# Patient Record
Sex: Female | Born: 1965 | Race: Black or African American | Hispanic: No | Marital: Married | Smoking: Never smoker
Health system: Southern US, Community
[De-identification: ages and names within clinical notes are randomized; demographics above are authoritative.]

## PROBLEM LIST (undated history)

## (undated) DIAGNOSIS — I10 Essential (primary) hypertension: Secondary | ICD-10-CM

## (undated) DIAGNOSIS — D219 Benign neoplasm of connective and other soft tissue, unspecified: Secondary | ICD-10-CM

## (undated) DIAGNOSIS — F329 Major depressive disorder, single episode, unspecified: Secondary | ICD-10-CM

## (undated) DIAGNOSIS — K449 Diaphragmatic hernia without obstruction or gangrene: Secondary | ICD-10-CM

## (undated) DIAGNOSIS — K5790 Diverticulosis of intestine, part unspecified, without perforation or abscess without bleeding: Secondary | ICD-10-CM

## (undated) DIAGNOSIS — G4730 Sleep apnea, unspecified: Secondary | ICD-10-CM

## (undated) DIAGNOSIS — C50919 Malignant neoplasm of unspecified site of unspecified female breast: Secondary | ICD-10-CM

## (undated) DIAGNOSIS — E039 Hypothyroidism, unspecified: Secondary | ICD-10-CM

## (undated) HISTORY — DX: Major depressive disorder, single episode, unspecified: F32.9

## (undated) HISTORY — DX: Malignant neoplasm of unspecified site of unspecified female breast: C50.919

## (undated) HISTORY — DX: Essential (primary) hypertension: I10

## (undated) HISTORY — DX: Diaphragmatic hernia without obstruction or gangrene: K44.9

## (undated) HISTORY — DX: Hypothyroidism, unspecified: E03.9

## (undated) HISTORY — PX: TUBAL LIGATION: SHX77

## (undated) HISTORY — DX: Diverticulosis of intestine, part unspecified, without perforation or abscess without bleeding: K57.90

## (undated) HISTORY — PX: MASTECTOMY: SHX3

## (undated) HISTORY — DX: Benign neoplasm of connective and other soft tissue, unspecified: D21.9

---

## 2004-09-01 ENCOUNTER — Ambulatory Visit: Payer: Self-pay | Admitting: *Deleted

## 2004-09-15 ENCOUNTER — Ambulatory Visit: Payer: Self-pay | Admitting: *Deleted

## 2004-09-22 ENCOUNTER — Ambulatory Visit: Payer: Self-pay | Admitting: *Deleted

## 2004-10-02 ENCOUNTER — Inpatient Hospital Stay (HOSPITAL_COMMUNITY): Admission: AD | Admit: 2004-10-02 | Discharge: 2004-10-02 | Payer: Self-pay | Admitting: Family Medicine

## 2004-10-06 ENCOUNTER — Ambulatory Visit: Payer: Self-pay | Admitting: *Deleted

## 2004-10-20 ENCOUNTER — Ambulatory Visit: Payer: Self-pay | Admitting: *Deleted

## 2004-10-29 ENCOUNTER — Ambulatory Visit (HOSPITAL_COMMUNITY): Admission: RE | Admit: 2004-10-29 | Discharge: 2004-10-29 | Payer: Self-pay | Admitting: *Deleted

## 2004-11-03 ENCOUNTER — Ambulatory Visit: Payer: Self-pay | Admitting: *Deleted

## 2004-11-12 ENCOUNTER — Ambulatory Visit: Payer: Self-pay | Admitting: Family Medicine

## 2004-11-17 ENCOUNTER — Ambulatory Visit (HOSPITAL_COMMUNITY): Admission: RE | Admit: 2004-11-17 | Discharge: 2004-11-17 | Payer: Self-pay | Admitting: *Deleted

## 2004-11-19 ENCOUNTER — Ambulatory Visit: Payer: Self-pay | Admitting: Family Medicine

## 2004-11-26 ENCOUNTER — Ambulatory Visit: Payer: Self-pay | Admitting: Family Medicine

## 2004-11-26 ENCOUNTER — Inpatient Hospital Stay (HOSPITAL_COMMUNITY): Admission: RE | Admit: 2004-11-26 | Discharge: 2004-11-26 | Payer: Self-pay | Admitting: Family Medicine

## 2004-12-03 ENCOUNTER — Ambulatory Visit: Payer: Self-pay | Admitting: *Deleted

## 2004-12-09 ENCOUNTER — Ambulatory Visit (HOSPITAL_COMMUNITY): Admission: RE | Admit: 2004-12-09 | Discharge: 2004-12-09 | Payer: Self-pay | Admitting: *Deleted

## 2004-12-09 ENCOUNTER — Ambulatory Visit: Payer: Self-pay | Admitting: Family Medicine

## 2004-12-10 ENCOUNTER — Inpatient Hospital Stay (HOSPITAL_COMMUNITY): Admission: AD | Admit: 2004-12-10 | Discharge: 2004-12-10 | Payer: Self-pay | Admitting: Obstetrics & Gynecology

## 2004-12-16 ENCOUNTER — Ambulatory Visit: Payer: Self-pay | Admitting: Obstetrics & Gynecology

## 2004-12-22 ENCOUNTER — Ambulatory Visit: Payer: Self-pay | Admitting: *Deleted

## 2004-12-24 ENCOUNTER — Ambulatory Visit: Payer: Self-pay | Admitting: Obstetrics and Gynecology

## 2004-12-24 ENCOUNTER — Inpatient Hospital Stay (HOSPITAL_COMMUNITY): Admission: AD | Admit: 2004-12-24 | Discharge: 2004-12-26 | Payer: Self-pay | Admitting: Family Medicine

## 2004-12-31 ENCOUNTER — Ambulatory Visit: Admission: RE | Admit: 2004-12-31 | Discharge: 2004-12-31 | Payer: Self-pay | Admitting: Family Medicine

## 2006-09-28 ENCOUNTER — Encounter: Payer: Self-pay | Admitting: Obstetrics & Gynecology

## 2006-09-28 ENCOUNTER — Ambulatory Visit: Payer: Self-pay | Admitting: Obstetrics & Gynecology

## 2007-02-22 ENCOUNTER — Ambulatory Visit: Payer: Self-pay | Admitting: Obstetrics and Gynecology

## 2007-03-15 ENCOUNTER — Ambulatory Visit: Payer: Self-pay | Admitting: *Deleted

## 2007-04-05 ENCOUNTER — Ambulatory Visit (HOSPITAL_COMMUNITY): Admission: RE | Admit: 2007-04-05 | Discharge: 2007-04-05 | Payer: Self-pay | Admitting: Family Medicine

## 2007-04-13 ENCOUNTER — Ambulatory Visit: Payer: Self-pay | Admitting: Family Medicine

## 2007-04-14 ENCOUNTER — Ambulatory Visit: Payer: Self-pay | Admitting: Obstetrics and Gynecology

## 2007-05-03 ENCOUNTER — Ambulatory Visit (HOSPITAL_COMMUNITY): Admission: RE | Admit: 2007-05-03 | Discharge: 2007-05-03 | Payer: Self-pay | Admitting: Family Medicine

## 2007-05-04 ENCOUNTER — Ambulatory Visit: Payer: Self-pay | Admitting: *Deleted

## 2007-05-17 ENCOUNTER — Ambulatory Visit (HOSPITAL_COMMUNITY): Admission: RE | Admit: 2007-05-17 | Discharge: 2007-05-17 | Payer: Self-pay | Admitting: Obstetrics & Gynecology

## 2007-05-29 ENCOUNTER — Ambulatory Visit: Payer: Self-pay | Admitting: Obstetrics & Gynecology

## 2007-06-26 ENCOUNTER — Ambulatory Visit: Payer: Self-pay | Admitting: Obstetrics & Gynecology

## 2007-06-26 ENCOUNTER — Ambulatory Visit (HOSPITAL_COMMUNITY): Admission: RE | Admit: 2007-06-26 | Discharge: 2007-06-26 | Payer: Self-pay | Admitting: Obstetrics & Gynecology

## 2007-07-17 ENCOUNTER — Ambulatory Visit: Payer: Self-pay | Admitting: Obstetrics & Gynecology

## 2007-08-07 ENCOUNTER — Ambulatory Visit (HOSPITAL_COMMUNITY): Admission: RE | Admit: 2007-08-07 | Discharge: 2007-08-07 | Payer: Self-pay | Admitting: Obstetrics & Gynecology

## 2007-08-10 ENCOUNTER — Ambulatory Visit: Payer: Self-pay | Admitting: Family Medicine

## 2007-08-24 ENCOUNTER — Ambulatory Visit: Payer: Self-pay | Admitting: Obstetrics & Gynecology

## 2007-09-04 ENCOUNTER — Ambulatory Visit (HOSPITAL_COMMUNITY): Admission: RE | Admit: 2007-09-04 | Discharge: 2007-09-04 | Payer: Self-pay | Admitting: Obstetrics & Gynecology

## 2007-09-08 ENCOUNTER — Ambulatory Visit: Payer: Self-pay | Admitting: Obstetrics and Gynecology

## 2007-09-08 ENCOUNTER — Inpatient Hospital Stay (HOSPITAL_COMMUNITY): Admission: AD | Admit: 2007-09-08 | Discharge: 2007-09-08 | Payer: Self-pay | Admitting: Family Medicine

## 2007-09-14 ENCOUNTER — Ambulatory Visit: Payer: Self-pay | Admitting: Family Medicine

## 2007-09-17 ENCOUNTER — Inpatient Hospital Stay (HOSPITAL_COMMUNITY): Admission: AD | Admit: 2007-09-17 | Discharge: 2007-09-18 | Payer: Self-pay | Admitting: Obstetrics and Gynecology

## 2007-09-17 ENCOUNTER — Ambulatory Visit: Payer: Self-pay | Admitting: Obstetrics and Gynecology

## 2007-09-21 ENCOUNTER — Ambulatory Visit: Payer: Self-pay | Admitting: Obstetrics & Gynecology

## 2007-09-28 ENCOUNTER — Ambulatory Visit: Payer: Self-pay | Admitting: Obstetrics & Gynecology

## 2007-10-02 ENCOUNTER — Encounter: Payer: Self-pay | Admitting: *Deleted

## 2007-10-02 ENCOUNTER — Ambulatory Visit: Payer: Self-pay | Admitting: Obstetrics & Gynecology

## 2007-10-02 ENCOUNTER — Inpatient Hospital Stay (HOSPITAL_COMMUNITY): Admission: AD | Admit: 2007-10-02 | Discharge: 2007-10-04 | Payer: Self-pay | Admitting: Obstetrics & Gynecology

## 2007-10-12 ENCOUNTER — Ambulatory Visit: Payer: Self-pay | Admitting: Obstetrics & Gynecology

## 2007-11-22 ENCOUNTER — Ambulatory Visit: Payer: Self-pay | Admitting: Obstetrics & Gynecology

## 2007-12-06 ENCOUNTER — Ambulatory Visit: Payer: Self-pay | Admitting: Obstetrics & Gynecology

## 2007-12-06 ENCOUNTER — Encounter: Payer: Self-pay | Admitting: Obstetrics & Gynecology

## 2009-05-20 ENCOUNTER — Encounter: Payer: Self-pay | Admitting: Obstetrics & Gynecology

## 2009-05-20 ENCOUNTER — Ambulatory Visit: Payer: Self-pay | Admitting: Obstetrics & Gynecology

## 2009-05-21 ENCOUNTER — Encounter: Payer: Self-pay | Admitting: Obstetrics & Gynecology

## 2009-05-21 LAB — CONVERTED CEMR LAB
Albumin: 4.4 g/dL (ref 3.5–5.2)
Alkaline Phosphatase: 62 units/L (ref 39–117)
CO2: 25 meq/L (ref 19–32)
Calcium: 9.2 mg/dL (ref 8.4–10.5)
Creatinine, Ser: 0.65 mg/dL (ref 0.40–1.20)
LDL Cholesterol: 146 mg/dL — ABNORMAL HIGH (ref 0–99)
Sodium: 140 meq/L (ref 135–145)
Total Bilirubin: 0.4 mg/dL (ref 0.3–1.2)
Total CHOL/HDL Ratio: 3.6
Total Protein: 7.7 g/dL (ref 6.0–8.3)
VLDL: 9 mg/dL (ref 0–40)

## 2009-05-26 ENCOUNTER — Encounter: Admission: RE | Admit: 2009-05-26 | Discharge: 2009-05-26 | Payer: Self-pay | Admitting: Obstetrics & Gynecology

## 2009-05-26 ENCOUNTER — Encounter: Payer: Self-pay | Admitting: Obstetrics & Gynecology

## 2010-08-06 ENCOUNTER — Ambulatory Visit: Admit: 2010-08-06 | Payer: Self-pay | Admitting: Obstetrics & Gynecology

## 2010-08-10 ENCOUNTER — Encounter
Admission: RE | Admit: 2010-08-10 | Discharge: 2010-08-10 | Payer: Self-pay | Source: Home / Self Care | Attending: Obstetrics & Gynecology | Admitting: Obstetrics & Gynecology

## 2010-08-11 ENCOUNTER — Other Ambulatory Visit: Payer: Self-pay | Admitting: Obstetrics & Gynecology

## 2010-08-11 ENCOUNTER — Ambulatory Visit
Admission: RE | Admit: 2010-08-11 | Discharge: 2010-08-11 | Payer: Self-pay | Source: Home / Self Care | Attending: Obstetrics & Gynecology | Admitting: Obstetrics & Gynecology

## 2010-08-14 ENCOUNTER — Encounter: Payer: Self-pay | Admitting: Obstetrics & Gynecology

## 2010-08-14 LAB — CONVERTED CEMR LAB
Cholesterol: 202 mg/dL — ABNORMAL HIGH (ref 0–200)
LDL Cholesterol: 137 mg/dL — ABNORMAL HIGH (ref 0–99)
TSH: 1.553 microintl units/mL (ref 0.350–4.500)
Triglycerides: 44 mg/dL (ref <150)
VLDL: 9 mg/dL (ref 0–40)

## 2010-12-08 NOTE — Op Note (Signed)
NAME:  Brittney Vance, Brittney Vance                  ACCOUNT NO.:  [id number]   MEDICAL RECORD NO.:  [id number]          PATIENT TYPE:  INP   LOCATION:  9111                          FACILITY:  [facility]   PHYSICIAN:  Allie Bossier, MD        DATE OF BIRTH:  [birth year]   DATE OF PROCEDURE:  10/03/2007  DATE OF DISCHARGE:                               OPERATIVE REPORT   PREOPERATIVE DIAGNOSIS:  1. Postpartum day #1 from spontaneous vaginal delivery.  2. Desires sterilization.   POSTOPERATIVE DIAGNOSIS:  1. Postpartum day #1 from spontaneous vaginal delivery.  2. Desires sterilization.   PROCEDURE:  Bilateral tubal occlusion with Filshie clips.   SURGEON:  Allie Bossier, M.D.   ASSISTANT:  Karlton Lemon, M.D.   ANESTHESIA:  Epidural.   FINDINGS:  Normal female anatomy as tubes were visualized.   ESTIMATED BLOOD LOSS:  Minimal, less than 10 mL.   DRAINS:  Foley with clear yellow urine.   COMPLICATIONS:  None immediate.   SPECIMENS:  None.   INDICATIONS FOR PROCEDURE:  The patient is a 45 year old gravida 2, para  2-0-0-2, who is postpartum day #1 from spontaneous vaginal delivery.  She has expressed the desire for permanent sterilization.  She filled  out her Medicaid form greater than 30 days in advance of the procedure.  She was counseled again about the permanent nature of the procedure and  wishes to proceed. She understands and accepts the failure rate of 1%.   DESCRIPTION OF PROCEDURE:  The patient was taken to the operating room  and after ensuring adequate epidural anesthesia, she was prepped and  draped in the usual sterile fashion in the supine position.  After  ensuring adequate anesthesia, the skin incision was made horizontally  through the umbilicus and carried down through the fascia into the  peritoneal cavity.  Army-Navy retractors were placed and the right  fallopian tube was identified.  The tube was clamped with a Babcock  clamp and followed out to the end of the tube with a  second Babcock  clamp.  The fimbria was identified easily.  It was visualized well.  The  Babcock clamps were then placed in the isthmus ampullary junction.  The  Filshie clip was placed under good visualization completely occluding  the tube.  The Babcock clamps were released and the tube was allowed to  fall back into the peritoneal cavity.  Attention was then turned to the  left tube.  The tube was identified and clamped with a Babcock clamp.  Once again the tube was followed out with a second Babcock clamp until  the fimbria was identified.  A Filshie clip was then placed between two  Babcock clamps at the isthmus ampullary junction of the fallopian tube  under direct visualization.  After placement of the Filshie clip, the  Babcock clamps were released and the tube was allowed to fall back  within the peritoneal cavity.  The fascia was then reapproximated with 0  Vicryl.  The subcutaneous tissues were then reapproximated with 4-0  Vicryl.  The patient tolerated  the procedure well and went to the  postanesthesia care unit in stable condition.  The instrument, sponge,  and needle counts were correct.      Karlton Lemon, MD  Electronically Signed     ______________________________  Allie Bossier, MD    NS/MEDQ  D:  10/03/2007  T:  10/04/2007  Job:  [id number]

## 2010-12-08 NOTE — Assessment & Plan Note (Signed)
NAME:  Brittney Vance, Brittney Vance                  ACCOUNT NO.:  [id number]   MEDICAL RECORD NO.:  [id number]          PATIENT TYPE:  POB   LOCATION:  [facility] at [place]         FACILITY:  [facility]   PHYSICIAN:  Allie Bossier, MD        DATE OF BIRTH:  February 06, 1966   DATE OF SERVICE:  08/11/2010                                  CLINIC NOTE   Brittney Vance is a 45 year old married black G2, P2, she has a 66-year-old  daughter and a 51-year-old son.  She comes here for annual exam.  She has  no particular complaints.   PAST MEDICAL HISTORY:  Significant for a fibroid seen on ultrasound  during her pregnancy and hypothyroidism and a history of hypertension,  although she is on no meds.   MEDICATIONS:  She takes Synthroid 0.75 mg daily and a multivitamin  daily.   REVIEW OF SYSTEMS:  She says her periods are monthly and lasts about  anywhere from 3 to 5 days.  She is married for 6-1/2 years and denies  dyspareunia.  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   PREVIOUS SURGERY:  Tubal ligation.   FAMILY HISTORY:  Negative for breast, GYN, and colon malignancies, but  positive for diabetes in her father and hypertension in both of her  parents.   No latex allergies.  No known drug allergies.   SOCIAL HISTORY:  She drinks wine socially and denies tobacco or drug  use.   REVIEW OF SYSTEMS:  Her mammogram was normal this year.   PHYSICAL EXAMINATION:  GENERAL:  Well-nourished, well-hydrated very  pleasant black female, height 5 feet 2 inches, weight 142 pounds, blood  pressure 139/86, pulse 88.  HEENT:  Normal.  HEART:  Regular rate rhythm.  LUNGS:  Clear to auscultation bilaterally.  ABDOMEN:  Benign.  GU:  External genitalia normal.  Cervix normal.  Uterus is retroverted,  8-week size (no change from previous exams).  Adnexa nontender, no  masses.   ASSESSMENT AND PLAN:  Annual exam.  I have checked the Pap smear.  Recommended self-breast and self-vulvar exams.   Her mammogram is up to  date.  With regard to her hypothyroidism, her endocrinologist has left  town, so she will come back next week to have her TSH checked along with  a fasting lipid pane.      Allie Bossier, MD     MCD/MEDQ  D:  08/11/2010  T:  08/12/2010  Job:  [id number]

## 2010-12-08 NOTE — Discharge Summary (Signed)
NAME:  Brittney Vance, Brittney Vance                  ACCOUNT NO.:  [id number]   MEDICAL RECORD NO.:  [id number]           PATIENT TYPE:   LOCATION:                                 FACILITY:   PHYSICIAN:  Allie Bossier, MD        DATE OF BIRTH:  19-Jan-1966   DATE OF ADMISSION:  DATE OF DISCHARGE:                               DISCHARGE SUMMARY   ADMISSION DIAGNOSES:  1. Intrauterine pregnancy at 38 weeks, 1 day gestation.  2. Oligohydramnios.  3. Hypothyroidism.  4. Borderline hypertension.  5. Group B streptococcus positive.  6. Desired Sterilization   DISCHARGE DIAGNOSES:  1. Post partum day 2 from vacuum-assisted vaginal delivery.  2. Postoperative day 1 from bilateral tubal ligation.  3. Hypothyroidism.  4. Borderline hypertension.  5. Group B streptococcus positive.   PROCEDURES:  1. The patient had a vacuum-assisted vaginal delivery of viable infant      female on October 02, 2007 performed by Dr. Nicholaus Bloom.  2. Bilateral tubal ligation performed on October 03, 2007.   COMPLICATIONS:  None.   CONSULTATIONS:  None.   PERTINENT LABORATORY FINDINGS:  The patient did have admission white  blood cell count of 6.1, hemoglobin 10.4, hematocrit 29.2, platelets  168. RPR was nonreactive. The patient had a complete blood count on  postpartum day 1 with white blood cell count of 7.3, hemoglobin 9.3,  hematocrit 26.2, platelets 163.   BRIEF PERTINENT ADMISSION HISTORY:  Brittney Vance is a 45 year old gravida 2,  para 1-0-0-1 at 38 weeks, 1 day gestation that was assessed by  ultrasound on the day of admission and found to have an amniotic fluid  index of 5.8. This met diagnostic criteria for oligohydramnios, and the  patient also had diagnosis of hypothyroidism and borderline chronic  hypertension. Induction was indicated, and the patient was admitted for  that reason.   HOSPITAL COURSE:  Brittney Vance was admitted for induction of labor for  oligohydramnios at 38 weeks, 1 day. She was found to have a  cervical  exam of 2, 90 and -1. She was started on Pitocin for induction of labor  as well as penicillin for group B streptococcus positive prophylaxis.  The patient progressed to complete and while pushing there was an  episode of fetal bradycardia necessitating vacuum-assisted vaginal  delivery. An infant female weighing 7 pounds, zero ounces with Apgars of 8  at one minute, 9 at 5 minutes, was delivered at 1945 on October 02, 2007  via vacuum-assisted vaginal delivery. The patient did have a second  degree midline laceration that was repaired in the usual manner with 3-0  Vicryl. On postpartum day 1, she did have a bilateral tubal occlusion  with Filshie clips for desired sterilization. The patient tolerated the  delivery and the operation well. On postpartum day 2/postoperative day  1, the patient was doing well. Her vitals were stable. Physical exam was  normal. She was to be discharged home in stable condition.   DISCHARGE STATUS:  Stable.   DISCHARGE MEDICATIONS:  1. Vicodin 1 tablet p.o. every 4  hours as needed for pain, dispensed      #30.  2. Motrin 600 mg 1 tablet by mouth every 6 hours as needed for pain.  3. Colace 100 mg 1 tablet p.o. twice daily.  4. Ferrous sulfate 325 mg 1 tablet by mouth twice daily.  5. The patient is to continue taking Flintstone vitamins as directed.  6. The patient is to continue her home Synthroid at 88 mcg daily.   DISCHARGE INSTRUCTIONS:  1. Discharged home.  2. No sexual activity x6 weeks.  3. No lifting greater than 10 pounds x6 weeks.  4. The patient is to follow up at the [facility] on      Thursday, March 19th at 3:15 in the afternoon to have  her      operative incision assessed.  5. The patient is to follow up at the [facility]      Department in 6 weeks for her post-partum examination. At that      time, she is to have a TSH test to assess  her need for her      continued current dosing of her  Synthroid.      Karlton Lemon, MD  Electronically Signed     ______________________________  Allie Bossier, MD    NS/MEDQ  D:  10/04/2007  T:  10/04/2007  Job:  [id number]

## 2010-12-08 NOTE — Assessment & Plan Note (Signed)
NAME:  Brittney, Vance NO.:  [id number]   MEDICAL RECORD NO.:  [id number]          PATIENT TYPE:  POB   LOCATION:  [facility] at [place]         FACILITY:  [facility]   PHYSICIAN:  Allie Bossier, MD        DATE OF BIRTH:  02-11-1966   DATE OF SERVICE:                                  CLINIC NOTE   Brittney Vance is a 45 year old, married, black gravida 2, para 2 who has no  complaints today.  I checked her TSH last month and the result showed  0.011.  These results were sent to Dr. Kathrynn Humble who manages her  thyroid.  These results were also communicated to the patient.  Dr.  Katrinka Blazing has decreased her Synthroid to 0.75 mg daily and will recheck TSH  in 8 weeks.   PAST MEDICAL HISTORY:  1. Hypothyroidism, diagnosed in approximately 2006.  2. Fibroids seen on a pregnancy ultrasound but not seen since.  3. Lower back pain, this has much decreased since her delivery, it is      only now when she is in a rocking chair with her baby.   PAST SURGICAL HISTORY:  She had a tubal ligation in 09/2007  (laparoscopic Filshie clip application).   FAMILY HISTORY:  Negative for breast, GYN, and colon malignancies but  positive for hypertension in her parents and diabetes in her father.   NO LATEX ALLERGY.  NO KNOWN DRUG ALLERGIES.   SOCIAL HISTORY:  Negative for tobacco, alcohol, or drug use.   REVIEW OF SYSTEMS:  She is married and monogamous and her Pap smear done  in 09/2006 was normal.  A 1-hour Glucola was normal twice during her  pregnancy of this year.  She has had intercourse since delivery and is  not having any issues there.   PHYSICAL EXAM:  Weight 139 pounds.  Height 5 feet 2 inches.  Blood  pressure 120/70.  Pulse 84.  HEENT:  Normal.  BREAST EXAM:  Normal for lactating breasts.  LUNGS:  Clear to auscultation bilaterally.  HEART:  Regular rate and rhythm.  ABDOMEN:  No hepatosplenomegaly.  No abnormalities noted.  EXTERNAL GENITALIA:  Normal cervix.  She is having what  appears to be a  light period.  Cervix has no lesions.  Perineum and external genitalia  have no lesions and appeared normal.  Bimanual exam reveals a 6-week  size, very mobile, nontender uterus.  Her adnexa are not enlarged and  nontender.   ASSESSMENT AND PLAN:  Annual exam.  I have checked the Pap smear.  Recommended self-breast and self-vulvar exams.  Her thyroid will be  managed by Dr. Katrinka Blazing.      Allie Bossier, MD     MCD/MEDQ  D:  12/06/2007  T:  12/06/2007  Job:  [id number]

## 2010-12-08 NOTE — Assessment & Plan Note (Signed)
NAME:  Brittney Vance, Brittney Vance NO.:  [id number]   MEDICAL RECORD NO.:  [id number]          PATIENT TYPE:  POB   LOCATION:  [facility] [place]         FACILITY:  [facility]   PHYSICIAN:  Allie Bossier, MD        DATE OF BIRTH:  1965-12-12   DATE OF SERVICE:  05/20/2009                                  CLINIC NOTE   Brittney Vance is a 45 year old married, black, gravida 2, para 2 with a 68-1/2-  year-old son and a 56-year-old daughter.  She comes in here for her  annual exam.  She has no complaints today.  She has gained several  pounds since her last visit but her thyroid has recently been checked by  her long-time endocrinologist, Dr. Katrinka Blazing.  Of note, he is moving out of  state and she will be having her annual TSH done with [place].   PAST MEDICAL HISTORY:  Hypothyroidism, fibroids, history of lower back  pain with her pregnancy.  She is overweight.   PAST SURGICAL HISTORY:  Tubal ligation.   FAMILY HISTORY:  Negative for breast, GYN, and colon malignancies but  positive for heart disease, hypertension, and diabetes mellitus.   ALLERGIES:  No known drug allergies.  No latex allergies.   MEDICATIONS:  She takes Synthroid 0.75 mcg a day, and Flintstones once a  day.   SOCIAL HISTORY:  Negative for tobacco, alcohol, or drug use.   REVIEW OF SYSTEMS:  Questions are all negative.  Her Pap smear done last  was normal, done here May 2009.  She has not had any recent lipid  profile or glucose checking.   PHYSICAL EXAMINATION:  VITAL SIGNS:  Height 5 feet 2 inches, weight 145,  blood pressure 137/92, pulse 96.  HEENT:  Normal.  BREASTS:  Normal bilaterally.  ABDOMEN:  Overweight.  No palpable hepatosplenomegaly.  GU:  External genitalia, no lesions.  Cervix, ovulatory type discharge.  Cervix appears normal.  Uterus is retroverted, 8-week size, consistent  with fibroids.  Adnexa are nontender and there are no masses  appreciated.   ASSESSMENT AND PLAN:  Annual exam.   RECOMMENDATIONS:   I have recommended she schedule fasting lipid and  fasting glucose and a mammogram.  These will be done at her convenience  in the near future.  Checked a Pap smear and she will get her thyroid  medication prescribed here every year with her TSH.      Allie Bossier, MD     MCD/MEDQ  D:  05/20/2009  T:  05/21/2009  Job:  [id number]

## 2010-12-11 NOTE — Group Therapy Note (Signed)
NAME:  Brittney Vance, Brittney Vance NO.:  [id number]   MEDICAL RECORD NO.:  [id number]          PATIENT TYPE:  WOC   LOCATION:  [facility]                   FACILITY:  [facility]   PHYSICIAN:  Elsie Lincoln, MD      DATE OF BIRTH:  23-Jul-1966   DATE OF SERVICE:                                  CLINIC NOTE   Audio too short to transcribe (less than 5 seconds)           ______________________________  Elsie Lincoln, MD     KL/MEDQ  D:  09/28/2006  T:  09/28/2006  Job:  [id number]

## 2010-12-11 NOTE — Group Therapy Note (Signed)
NAME:  Brittney Vance, Brittney Vance NO.:  [id number]   MEDICAL RECORD NO.:  [id number]          PATIENT TYPE:  WOC   LOCATION:  [facility]                   FACILITY:  [facility]   PHYSICIAN:  Elsie Lincoln, MD      DATE OF BIRTH:  June 23, 1966   DATE OF SERVICE:  09/28/2006                                  CLINIC NOTE   The patient is a 45 year old G 1, para 1-0-0-1 female whom we delivered  a year-and-a-half ago.  She presents for her annual exam.  Her only  change in her past medical history is that she has hypothyroidism, new  diagnosis, and her blood pressure today is 137/91, so borderline  hypertension.  She is sexually active.  She has not been using birth  control since her last delivery.  Her last menstrual period was September 12, 2006 and she wants another baby.  I told her to start taking her  prenatal vitamins again.  However, she said she does not tolerate them  and so she takes __________  a day, which is fine for folic acid.  The  patient feels well.  No complaints.  She still is breast feeding, but  has not had a mammogram yet.   PAST MEDICAL HISTORY:  Hypothyroidism.  Borderline hypertension.   PAST SURGICAL HISTORY:  None.   ALLERGIES:  None.   REVIEW OF SYSTEMS:  Negative.   VITAL SIGNS:  Temperature 98.2, pulse 87, blood pressure 137/91, weight  145.1, height 5 feet 2 inches.  GENERAL:  Well-nourished, well-developed no apparent distress.  HEENT:  Normocephalic, atraumatic.  Thyroid no masses.  LUNGS:  Clear to auscultation bilaterally.  HEART:  Regular rate and rhythm.  BREASTS:  Nontender.  No lymphadenopathy.  No nipple discharge.  ABDOMEN:  Soft and nontender.  Nondistended.  No organomegaly.  No  hernia.  GENITALIA:  Tanner 5.  Vagina pink.  Normal rugae.  Cervix closed.  Nontender.  Uterus nontender.  No fibroid felt.  Adnexa no masses,  nontender.  LOWER EXTREMITIES:  There is a dark irregularly-bordered mole on the  right anterior shin.   ASSESSMENT AND PLAN:  A 45 year old female for Pap smear cultures.  1. Pap smear and GC and chlamydia done.  2. Mammogram after stops breast-feeding.  3. Borderline hypertension, get a primary care doctor and have this      addressed.  4. Referral to dermatology to have lesion looked at on her leg.  5. Return to clinic in 1 year or if she becomes pregnant.           ______________________________  Elsie Lincoln, MD     KL/MEDQ  D:  09/28/2006  T:  09/28/2006  Job:  [id number]

## 2011-02-05 ENCOUNTER — Inpatient Hospital Stay (HOSPITAL_COMMUNITY)
Admission: AD | Admit: 2011-02-05 | Discharge: 2011-02-05 | Discharge status: Against Medical Advice | Payer: PRIVATE HEALTH INSURANCE | Source: Ambulatory Visit | Attending: Obstetrics and Gynecology | Admitting: Obstetrics and Gynecology

## 2011-02-05 DIAGNOSIS — R109 Unspecified abdominal pain: Secondary | ICD-10-CM | POA: Insufficient documentation

## 2011-02-05 NOTE — Progress Notes (Signed)
Had pain for 2 days- now is gone

## 2011-02-05 NOTE — Progress Notes (Signed)
No cycle in June.  Has had abd pain, has hx of fibroids

## 2011-04-15 LAB — POCT URINALYSIS DIP (DEVICE)
Bilirubin Urine: NEGATIVE
Glucose, UA: 100 — AB
Glucose, UA: NEGATIVE
Hgb urine dipstick: NEGATIVE
Nitrite: NEGATIVE
Nitrite: NEGATIVE
Operator id: 297281
Protein, ur: 30 — AB
Protein, ur: 30 — AB
Urobilinogen, UA: 1
pH: 6

## 2011-04-16 LAB — URINALYSIS, ROUTINE W REFLEX MICROSCOPIC
Glucose, UA: NEGATIVE
Glucose, UA: NEGATIVE
Ketones, ur: >80 — AB
Nitrite: NEGATIVE
Nitrite: NEGATIVE
Specific Gravity, Urine: >1.03 — ABNORMAL HIGH
Specific Gravity, Urine: 1.025
pH: 5.5
pH: 6

## 2011-04-16 LAB — POCT URINALYSIS DIP (DEVICE)
Glucose, UA: NEGATIVE
Hgb urine dipstick: NEGATIVE
Nitrite: NEGATIVE
Operator id: 135281
Specific Gravity, Urine: 1.025
Specific Gravity, Urine: >=1.03
Urobilinogen, UA: 1
pH: 6.5

## 2011-04-16 LAB — URINE MICROSCOPIC-ADD ON

## 2011-04-16 LAB — URINALYSIS, DIPSTICK ONLY
Hgb urine dipstick: NEGATIVE
Ketones, ur: >80 — AB
Leukocytes, UA: NEGATIVE
Nitrite: NEGATIVE
Specific Gravity, Urine: >1.03 — ABNORMAL HIGH
Urobilinogen, UA: 0.2

## 2011-04-16 LAB — BASIC METABOLIC PANEL
BUN: 8
CO2: 19
Chloride: 104
Glucose, Bld: 72
Potassium: 3.8

## 2011-04-19 LAB — POCT URINALYSIS DIP (DEVICE)
Bilirubin Urine: NEGATIVE
Glucose, UA: NEGATIVE
Hgb urine dipstick: NEGATIVE
Hgb urine dipstick: NEGATIVE
Nitrite: NEGATIVE
Nitrite: NEGATIVE
Specific Gravity, Urine: 1.01
Specific Gravity, Urine: >=1.03
Urobilinogen, UA: 1
pH: 6
pH: 6.5

## 2011-04-19 LAB — CBC
HCT: 29.2 — ABNORMAL LOW
Hemoglobin: 10.4 — ABNORMAL LOW
MCHC: 35.5
MCHC: 35.7
MCV: 90.9
MCV: 92.3
Platelets: 163
RBC: 3.21 — ABNORMAL LOW
RDW: 14.7
RDW: 15
WBC: 7.7

## 2011-04-19 LAB — CCBB MATERNAL DONOR DRAW

## 2011-04-30 LAB — POCT URINALYSIS DIP (DEVICE)
Nitrite: NEGATIVE
Protein, ur: NEGATIVE
Urobilinogen, UA: 1
pH: 7

## 2011-05-03 LAB — POCT URINALYSIS DIP (DEVICE)
Hgb urine dipstick: NEGATIVE
Nitrite: NEGATIVE
Protein, ur: 30 — AB
pH: 6

## 2011-05-04 LAB — POCT URINALYSIS DIP (DEVICE)
Hgb urine dipstick: NEGATIVE
Ketones, ur: NEGATIVE
Nitrite: NEGATIVE
Protein, ur: NEGATIVE
pH: 6.5

## 2011-05-06 LAB — POCT URINALYSIS DIP (DEVICE)
Hgb urine dipstick: NEGATIVE
Ketones, ur: NEGATIVE
Protein, ur: NEGATIVE
pH: 6

## 2011-05-07 LAB — POCT URINALYSIS DIP (DEVICE)
Bilirubin Urine: NEGATIVE
Hgb urine dipstick: NEGATIVE
Ketones, ur: NEGATIVE
Protein, ur: 30 — AB
pH: 6

## 2011-08-03 ENCOUNTER — Other Ambulatory Visit: Payer: Self-pay | Admitting: Obstetrics & Gynecology

## 2011-11-17 ENCOUNTER — Ambulatory Visit: Payer: PRIVATE HEALTH INSURANCE | Admitting: Obstetrics & Gynecology

## 2011-11-23 ENCOUNTER — Encounter: Payer: Self-pay | Admitting: *Deleted

## 2011-11-23 ENCOUNTER — Ambulatory Visit: Payer: PRIVATE HEALTH INSURANCE | Admitting: Obstetrics & Gynecology

## 2011-12-07 ENCOUNTER — Encounter: Payer: Self-pay | Admitting: Obstetrics & Gynecology

## 2011-12-07 ENCOUNTER — Ambulatory Visit (INDEPENDENT_AMBULATORY_CARE_PROVIDER_SITE_OTHER): Payer: BC Managed Care – PPO | Admitting: Obstetrics & Gynecology

## 2011-12-07 VITALS — BP 146/93 | HR 82 | Temp 98.5°F | Resp 16 | Ht 62.0 in | Wt 139.0 lb

## 2011-12-07 DIAGNOSIS — Z0000 Encounter for general adult medical examination without abnormal findings: Secondary | ICD-10-CM

## 2011-12-07 DIAGNOSIS — Z124 Encounter for screening for malignant neoplasm of cervix: Secondary | ICD-10-CM

## 2011-12-07 DIAGNOSIS — Z113 Encounter for screening for infections with a predominantly sexual mode of transmission: Secondary | ICD-10-CM

## 2011-12-07 DIAGNOSIS — E039 Hypothyroidism, unspecified: Secondary | ICD-10-CM

## 2011-12-07 MED ORDER — LEVOTHYROXINE SODIUM 88 MCG PO TABS: 88.0000 ug | ORAL_TABLET | Freq: Every day | ORAL | Status: DC

## 2011-12-07 NOTE — Progress Notes (Signed)
Subjective:    Brittney Vance is a 46 y.o. female who presents for an annual exam. The patient has no complaints today.She would like a refill of her synthroid. The patient is sexually active. GYN screening history: last pap: was normal. The patient wears seatbelts: yes. The patient participates in regular exercise: no. Has the patient ever been transfused or tattooed?: no. The patient reports that there is not domestic violence in her life.   Menstrual History: OB History    Grav Para Term Preterm Abortions TAB SAB Ect Mult Living   2 2              Menarche age: 58 Patient's last menstrual period was 11/30/2011.    The following portions of the patient's history were reviewed and updated as appropriate: allergies, current medications, past family history, past medical history, past social history, past surgical history and problem list.  Review of Systems A comprehensive review of systems was negative. She has been married for 7 1/2 years, denies dysparunia. 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 She has a son and daughter.   Objective:    BP 146/93  Pulse 82  Temp(Src) 98.5 F (36.9 C) (Oral)  Resp 16  Ht 5\' 2"  (1.575 m)  Wt 139 lb (63.05 kg)  BMI 25.42 kg/m2  LMP 11/30/2011  General Appearance:    Alert, cooperative, no distress, appears stated age  Head:    Normocephalic, without obvious abnormality, atraumatic  Eyes:    PERRL, conjunctiva/corneas clear, EOM's intact, fundi    benign, both eyes  Ears:    Normal TM's and external ear canals, both ears  Nose:   Nares normal, septum midline, mucosa normal, no drainage    or sinus tenderness  Throat:   Lips, mucosa, and tongue normal; teeth and gums normal  Neck:   Supple, symmetrical, trachea midline, no adenopathy;    thyroid:  no enlargement/tenderness/nodules; no carotid   bruit or JVD  Back:     Symmetric, no curvature, ROM normal, no CVA tenderness  Lungs:     Clear to auscultation bilaterally, respirations  unlabored  Chest Wall:    No tenderness or deformity   Heart:    Regular rate and rhythm, S1 and S2 normal, no murmur, rub   or gallop  Breast Exam:    No tenderness, masses, or nipple abnormality  Abdomen:     Soft, non-tender, bowel sounds active all four quadrants,    no masses, no organomegaly  Genitalia:    Normal female without lesion, discharge or tenderness, 8 week size uterus c/w fibroids, no adnexal masses     Extremities:   Extremities normal, atraumatic, no cyanosis or edema  Pulses:   2+ and symmetric all extremities  Skin:   Skin color, texture, turgor normal, no rashes or lesions  Lymph nodes:   Cervical, supraclavicular, and axillary nodes normal  Neurologic:   CNII-XII intact, normal strength, sensation and reflexes    throughout  .    Assessment:    Healthy female exam.    Plan:     Mammogram. Pap smear.  She will come back for fasting TSH and lipids, c meta

## 2012-01-03 NOTE — Progress Notes (Signed)
Addended by: Granville Lewis on: 01/03/2012 01:46 PM   Modules accepted: Orders

## 2012-01-04 ENCOUNTER — Other Ambulatory Visit: Payer: BC Managed Care – PPO

## 2012-01-04 ENCOUNTER — Ambulatory Visit: Payer: BC Managed Care – PPO

## 2012-11-20 ENCOUNTER — Telehealth: Payer: Self-pay | Admitting: *Deleted

## 2012-11-20 NOTE — Telephone Encounter (Signed)
Pt called with questions about her past delivery of child on 10/02/07. She stated that her son had some seizures recently and has upcoming appt w/neurologist. She further stated that @ the time of her last ultrasound while pregnant, she was told that they "could not see the baby and that there was no fluid around him." She says that she was "rushed for admission and delivery". She wanted to know if this information would be of value to the neurologist and does she need to get copies of her records. I reviewed the [place] report from 10/02/07 and discussed the results. I stated that it appears that the baby was visualized very well. Measurements were completed for the approximate size, heart rate was measured and was normal and anatomy had been previously performed. The amniotic fluid level was low, however not critically. There were notes which stated that the pt reported sx of having ruptured membranes. I stated that it appeared to me that the reason she was admitted was due to low amniotic fluid and the likelihood that her water had broken. Everything about the baby from the [place] report was normal. I advised pt to discuss this information with the neurologist and take direction from him as to whether he needs actual copies of her records. I told pt that she may request copies of her records from the HIM dept and provided the contact number.  Pt voiced understanding and thanked me for the information.

## 2013-01-19 ENCOUNTER — Other Ambulatory Visit: Payer: BC Managed Care – PPO

## 2013-01-19 LAB — LIPID PANEL
Cholesterol: 217 mg/dL — ABNORMAL HIGH (ref 0–200)
HDL: 63 mg/dL (ref >39)
LDL Cholesterol: 139 mg/dL — ABNORMAL HIGH (ref 0–99)
Total CHOL/HDL Ratio: 3.4 Ratio
Triglycerides: 74 mg/dL (ref <150)
VLDL: 15 mg/dL (ref 0–40)

## 2013-01-19 LAB — CBC
HCT: 38.4 % (ref 36.0–46.0)
Hemoglobin: 13.2 g/dL (ref 12.0–15.0)
MCH: 29.3 pg (ref 26.0–34.0)
MCHC: 34.4 g/dL (ref 30.0–36.0)
MCV: 85.3 fL (ref 78.0–100.0)
RDW: 12.9 % (ref 11.5–15.5)

## 2013-01-19 LAB — COMPREHENSIVE METABOLIC PANEL
ALT: 20 U/L (ref 0–35)
AST: 21 U/L (ref 0–37)
Alkaline Phosphatase: 70 U/L (ref 39–117)
BUN: 7 mg/dL (ref 6–23)
Chloride: 106 mEq/L (ref 96–112)
Creat: 0.64 mg/dL (ref 0.50–1.10)
Total Bilirubin: 0.4 mg/dL (ref 0.3–1.2)

## 2013-01-25 ENCOUNTER — Ambulatory Visit (INDEPENDENT_AMBULATORY_CARE_PROVIDER_SITE_OTHER): Payer: PRIVATE HEALTH INSURANCE | Admitting: Obstetrics & Gynecology

## 2013-01-25 ENCOUNTER — Encounter: Payer: Self-pay | Admitting: Obstetrics & Gynecology

## 2013-01-25 ENCOUNTER — Encounter: Payer: Self-pay | Admitting: Family Medicine

## 2013-01-25 ENCOUNTER — Ambulatory Visit (INDEPENDENT_AMBULATORY_CARE_PROVIDER_SITE_OTHER): Payer: PRIVATE HEALTH INSURANCE | Admitting: Family Medicine

## 2013-01-25 VITALS — BP 166/101 | HR 74 | Resp 17 | Ht 62.0 in | Wt 142.0 lb

## 2013-01-25 VITALS — BP 130/100 | HR 75 | Ht 61.6 in | Wt 142.0 lb

## 2013-01-25 DIAGNOSIS — I10 Essential (primary) hypertension: Secondary | ICD-10-CM

## 2013-01-25 DIAGNOSIS — N951 Menopausal and female climacteric states: Secondary | ICD-10-CM | POA: Insufficient documentation

## 2013-01-25 DIAGNOSIS — Z01419 Encounter for gynecological examination (general) (routine) without abnormal findings: Secondary | ICD-10-CM

## 2013-01-25 DIAGNOSIS — E039 Hypothyroidism, unspecified: Secondary | ICD-10-CM

## 2013-01-25 DIAGNOSIS — Z0000 Encounter for general adult medical examination without abnormal findings: Secondary | ICD-10-CM

## 2013-01-25 MED ORDER — HYDROCHLOROTHIAZIDE 25 MG PO TABS: 25.0000 mg | ORAL_TABLET | Freq: Every day | ORAL | Status: DC

## 2013-01-25 NOTE — Progress Notes (Addendum)
Subjective:    Patient ID: Brittney Vance, female    DOB: 06-Sep-1965, 47 y.o.   MRN: [id number]  HPI She went to get her OB/GYN Wellness Exam today and while there was told her BP was high and needed to be seen immediately.  We worked her into the schedule.  She had had no prior diagnosis of hypertension and had never been on any prescription medications to control blood pressure. Currently she only takes thyroid medication. No CP or SOB. No visiual changes. She denies feeling lightheaded or dizzy today. She has been under a lot stress with taking care of her sick mother and work has been really stressful.  She thinks this is probably the cause of her heart pressure today. NO swelling, HA or heart palpitations of late.Barrington Ellison family history of hypertension in her mother and her father. Never smoker.  Hypothyroid - recent levels drawn.  Doing well on current regimen. No recent skin or hair changes. No recent changes in weight. She says she takes her medication regularly. Lab Results  Component Value Date   TSH 0.431 01/19/2013      Review of Systems  Constitutional: Negative for fever, diaphoresis and unexpected weight change.  HENT: Negative for hearing loss, rhinorrhea and tinnitus.   Eyes: Negative for visual disturbance.  Respiratory: Negative for cough and wheezing.   Cardiovascular: Negative for chest pain and palpitations.  Gastrointestinal: Negative for nausea, vomiting, diarrhea and blood in stool.  Genitourinary: Negative for vaginal bleeding, vaginal discharge and difficulty urinating.  Musculoskeletal: Negative for myalgias and arthralgias.  Skin: Negative for rash.  Neurological: Negative for headaches.  Hematological: Negative for adenopathy. Does not bruise/bleed easily.  Psychiatric/Behavioral: Negative for sleep disturbance and dysphoric mood. The patient is not nervous/anxious.    BP 130/100  Pulse 75  Ht 5' 1.6" (1.565 m)  Wt 142 lb (64.411 kg)  BMI 26.3 kg/m2  LMP  07/28/2012    No Known Allergies  Past Medical History  Diagnosis Date  . Fibroids   . Hypertension   . Hypothyroid     Past Surgical History  Procedure Laterality Date  . Tubal ligation      History   Social History  . Marital Status: Married    Spouse Name: N/A    Number of Children: N/A  . Years of Education: N/A   Occupational History  . Not on file.   Social History Main Topics  . Smoking status: Never Smoker   . Smokeless tobacco: Never Used  . Alcohol Use: Yes     Comment: occassion  . Drug Use: No  . Sexually Active: Yes -- Female partner(s)   Other Topics Concern  . Not on file   Social History Narrative  . No narrative on file    Family History  Problem Relation Age of Onset  . Hypertension Mother   . Hypertension Father   . Hypertension Brother   . Diabetes Mother   . Diabetes Father     Outpatient Encounter Prescriptions as of 01/25/2013  Medication Sig Dispense Refill  . hydrochlorothiazide (HYDRODIURIL) 25 MG tablet Take 1 tablet (25 mg total) by mouth daily.  30 tablet  1  . levothyroxine (SYNTHROID, LEVOTHROID) 88 MCG tablet Take 1 tablet (88 mcg total) by mouth daily.  31 tablet  12  . Multiple Vitamins-Minerals (MULTIVITAMIN WITH MINERALS) tablet Take 1 tablet by mouth daily.       No facility-administered encounter medications on file as of 01/25/2013.  Objective:   Physical Exam  Constitutional: She is oriented to person, place, and time. She appears well-developed and well-nourished.  HENT:  Head: Normocephalic and atraumatic.  Eyes: Conjunctivae are normal. Pupils are equal, round, and reactive to light.  Neck: Neck supple. No thyromegaly present.  Cardiovascular: Normal rate, regular rhythm and normal heart sounds.   No carotid or abdominal bruits  Pulmonary/Chest: Effort normal and breath sounds normal.  Musculoskeletal: She exhibits no edema.  Lymphadenopathy:    She has no cervical adenopathy.  Neurological: She  is alert and oriented to person, place, and time.  Skin: Skin is warm and dry.  Psychiatric: She has a normal mood and affect. Her behavior is normal.          Assessment & Plan:  HTN - New dx. discussed treatment options. Work on low salt diet, exercise, diet, and will start medicaiton. Start HCTZ. Discussed potential S.E. Of the med.  Had long discussion about the importance of tx BP and potential complications of untreated hypertension. F/U in 1 mo.   Hypothyroid -well controlled on current regimen.

## 2013-01-25 NOTE — Progress Notes (Signed)
  Subjective:    Brittney Vance is a 47 y.o. female who presents for an annual exam. The patient has no complaints today. The patient is sexually active. GYN screening history: last pap: approximate date 5/13 and was normal. The patient wears seatbelts: yes. The patient participates in regular exercise: no. Has the patient ever been transfused or tattooed?: no. The patient reports that there is not domestic violence in her life.   Menstrual History: OB History   Grav Para Term Preterm Abortions TAB SAB Ect Mult Living   2 2             Patient's last menstrual period was 07/28/2012.    The following portions of the patient's history were reviewed and updated as appropriate: allergies, current medications, past family history, past medical history, past social history, past surgical history and problem list.  Review of Systems Pertinent items are noted in HPI.    Objective:   Filed Vitals:   01/25/13 1424  BP: 166/101  Pulse: 74  Resp: 17  Height: 5\' 2"  (1.575 m)  Weight: 142 lb (64.411 kg)      Vitals: HTN--needs to be addressed today General appearance: alert, cooperative and no distress Head: Normocephalic, without obvious abnormality, atraumatic Eyes: negative Throat: lips, mucosa, and tongue normal; teeth and gums normal Lungs: clear to auscultation bilaterally Breasts: normal appearance, no masses or tenderness, No nipple retraction or dimpling, No nipple discharge or bleeding Heart: regular rate and rhythm Abdomen: soft, non-tender; bowel sounds normal; no masses,  no organomegaly Pelvic: cervix normal in appearance, external genitalia normal, no adnexal masses or tenderness, no bladder tenderness, no cervical motion tenderness, perianal skin: no external genital warts noted, rectovaginal septum normal, urethra without abnormality or discharge, uterus normal size, shape, and consistency and vagina normal without discharge Extremities: no edema, redness or tenderness in the  calves or thighs Skin: no lesions or rash Lymph nodes: Axillary adenopathy: none    .    Assessment:    Healthy female exam.  HTN Menopause   Plan:     Mammogram.  Referral to PCP today to address HTN or go to urgent care Pap nml last year.  No history of dysplasia.  Need pap next year with HPV then go to at least q3 yrs. Colonoscopy next year Low cholesterol diet--rpt in 6 months Euthyroid

## 2013-01-25 NOTE — Patient Instructions (Signed)
1.5 Gram Low Sodium Diet  A 1.5 gram sodium diet restricts the amount of sodium in the diet to no more than 1.5 g or 1500 mg daily. The American Heart Association recommends Americans over the age of 20 to consume no more than 1500 mg of sodium each day to reduce the risk of developing high blood pressure. Research also shows that limiting sodium may reduce heart attack and stroke risk. Many foods contain sodium for flavor and sometimes as a preservative. When the amount of sodium in a diet needs to be low, it is important to know what to look for when choosing foods and drinks. The following includes some information and guidelines to help make it easier for you to adapt to a low sodium diet.  QUICK TIPS  · Do not add salt to food.  · Avoid convenience items and fast food.  · Choose unsalted snack foods.  · Buy lower sodium products, often labeled as "lower sodium" or "no salt added."  · Check food labels to learn how much sodium is in 1 serving.  · When eating at a restaurant, ask that your food be prepared with less salt or none, if possible.  READING FOOD LABELS FOR SODIUM INFORMATION  The nutrition facts label is a good place to find how much sodium is in foods. Look for products with no more than 400 mg of sodium per serving.  Remember that 1.5 g = 1500 mg.  The food label may also list foods as:  · Sodium-free: Less than 5 mg in a serving.  · Very low sodium: 35 mg or less in a serving.  · Low-sodium: 140 mg or less in a serving.  · Light in sodium: 50% less sodium in a serving. For example, if a food that usually has 300 mg of sodium is changed to become light in sodium, it will have 150 mg of sodium.  · Reduced sodium: 25% less sodium in a serving. For example, if a food that usually has 400 mg of sodium is changed to reduced sodium, it will have 300 mg of sodium.  CHOOSING FOODS  Grains  · Avoid: Salted crackers and snack items. Some cereals, including instant hot cereals. Bread stuffing and biscuit mixes.  Seasoned rice or pasta mixes.  · Choose: Unsalted snack items. Low-sodium cereals, oats, puffed wheat and rice, shredded wheat. English muffins and bread. Pasta.  Meats  · Avoid: Salted, canned, smoked, spiced, pickled meats, including fish and poultry. Bacon, ham, sausage, cold cuts, hot dogs, anchovies.  · Choose: Low-sodium canned tuna and salmon. Fresh or frozen meat, poultry, and fish.  Dairy  · Avoid: Processed cheese and spreads. Cottage cheese. Buttermilk and condensed milk. Regular cheese.  · Choose: Milk. Low-sodium cottage cheese. Yogurt. Sour cream. Low-sodium cheese.  Fruits and Vegetables  · Avoid: Regular canned vegetables. Regular canned tomato sauce and paste. Frozen vegetables in sauces. Olives. Pickles. Relishes. Sauerkraut.  · Choose: Low-sodium canned vegetables. Low-sodium tomato sauce and paste. Frozen or fresh vegetables. Fresh and frozen fruit.  Condiments  · Avoid: Canned and packaged gravies. Worcestershire sauce. Tartar sauce. Barbecue sauce. Soy sauce. Steak sauce. Ketchup. Onion, garlic, and table salt. Meat flavorings and tenderizers.  · Choose: Fresh and dried herbs and spices. Low-sodium varieties of mustard and ketchup. Lemon juice. Tabasco sauce. Horseradish.  SAMPLE 1.5 GRAM SODIUM MEAL PLAN  Breakfast / Sodium (mg)  · 1 cup low-fat milk / 143 mg  · 1 whole-wheat English muffin / 240   mg  · 1 tbs heart-healthy margarine / 153 mg  · 1 hard-boiled egg / 139 mg  · 1 small orange / 0 mg  Lunch / Sodium (mg)  · 1 cup raw carrots / 76 mg  · 2 tbs no salt added peanut butter / 5 mg  · 2 slices whole-wheat bread / 270 mg  · 1 tbs jelly / 6 mg  · ½ cup red grapes / 2 mg  Dinner / Sodium (mg)  · 1 cup whole-wheat pasta / 2 mg  · 1 cup low-sodium tomato sauce / 73 mg  · 3 oz lean ground beef / 57 mg  · 1 small side salad (1 cup raw spinach leaves, ½ cup cucumber, ¼ cup yellow bell pepper) with 1 tsp olive oil and 1 tsp red wine vinegar / 25 mg  Snack / Sodium (mg)  · 1 container low-fat  vanilla yogurt / 107 mg  · 3 graham cracker squares / 127 mg  Nutrient Analysis  · Calories: 1745  · Protein: 75 g  · Carbohydrate: 237 g  · Fat: 57 g  · Sodium: 1425 mg  Document Released: 07/12/2005 Document Revised: 10/04/2011 Document Reviewed: 10/13/2009  Exit[employer]® Patient Information ©2014 [employer].  1.5 Gram Low Sodium Diet  A 1.5 gram sodium diet restricts the amount of sodium in the diet to no more than 1.5 g or 1500 mg daily. The American Heart Association recommends Americans over the age of 20 to consume no more than 1500 mg of sodium each day to reduce the risk of developing high blood pressure. Research also shows that limiting sodium may reduce heart attack and stroke risk. Many foods contain sodium for flavor and sometimes as a preservative. When the amount of sodium in a diet needs to be low, it is important to know what to look for when choosing foods and drinks. The following includes some information and guidelines to help make it easier for you to adapt to a low sodium diet.  QUICK TIPS  · Do not add salt to food.  · Avoid convenience items and fast food.  · Choose unsalted snack foods.  · Buy lower sodium products, often labeled as "lower sodium" or "no salt added."  · Check food labels to learn how much sodium is in 1 serving.  · When eating at a restaurant, ask that your food be prepared with less salt or none, if possible.  READING FOOD LABELS FOR SODIUM INFORMATION  The nutrition facts label is a good place to find how much sodium is in foods. Look for products with no more than 400 mg of sodium per serving.  Remember that 1.5 g = 1500 mg.  The food label may also list foods as:  · Sodium-free: Less than 5 mg in a serving.  · Very low sodium: 35 mg or less in a serving.  · Low-sodium: 140 mg or less in a serving.  · Light in sodium: 50% less sodium in a serving. For example, if a food that usually has 300 mg of sodium is changed to become light in sodium, it will have 150 mg of  sodium.  · Reduced sodium: 25% less sodium in a serving. For example, if a food that usually has 400 mg of sodium is changed to reduced sodium, it will have 300 mg of sodium.  CHOOSING FOODS  Grains  · Avoid: Salted crackers and snack items. Some cereals, including instant hot cereals. Bread stuffing and   biscuit mixes. Seasoned rice or pasta mixes.  · Choose: Unsalted snack items. Low-sodium cereals, oats, puffed wheat and rice, shredded wheat. English muffins and bread. Pasta.  Meats  · Avoid: Salted, canned, smoked, spiced, pickled meats, including fish and poultry. Bacon, ham, sausage, cold cuts, hot dogs, anchovies.  · Choose: Low-sodium canned tuna and salmon. Fresh or frozen meat, poultry, and fish.  Dairy  · Avoid: Processed cheese and spreads. Cottage cheese. Buttermilk and condensed milk. Regular cheese.  · Choose: Milk. Low-sodium cottage cheese. Yogurt. Sour cream. Low-sodium cheese.  Fruits and Vegetables  · Avoid: Regular canned vegetables. Regular canned tomato sauce and paste. Frozen vegetables in sauces. Olives. Pickles. Relishes. Sauerkraut.  · Choose: Low-sodium canned vegetables. Low-sodium tomato sauce and paste. Frozen or fresh vegetables. Fresh and frozen fruit.  Condiments  · Avoid: Canned and packaged gravies. Worcestershire sauce. Tartar sauce. Barbecue sauce. Soy sauce. Steak sauce. Ketchup. Onion, garlic, and table salt. Meat flavorings and tenderizers.  · Choose: Fresh and dried herbs and spices. Low-sodium varieties of mustard and ketchup. Lemon juice. Tabasco sauce. Horseradish.  SAMPLE 1.5 GRAM SODIUM MEAL PLAN  Breakfast / Sodium (mg)  · 1 cup low-fat milk / 143 mg  · 1 whole-wheat English muffin / 240 mg  · 1 tbs heart-healthy margarine / 153 mg  · 1 hard-boiled egg / 139 mg  · 1 small orange / 0 mg  Lunch / Sodium (mg)  · 1 cup raw carrots / 76 mg  · 2 tbs no salt added peanut butter / 5 mg  · 2 slices whole-wheat bread / 270 mg  · 1 tbs jelly / 6 mg  · ½ cup red grapes / 2  mg  Dinner / Sodium (mg)  · 1 cup whole-wheat pasta / 2 mg  · 1 cup low-sodium tomato sauce / 73 mg  · 3 oz lean ground beef / 57 mg  · 1 small side salad (1 cup raw spinach leaves, ½ cup cucumber, ¼ cup yellow bell pepper) with 1 tsp olive oil and 1 tsp red wine vinegar / 25 mg  Snack / Sodium (mg)  · 1 container low-fat vanilla yogurt / 107 mg  · 3 graham cracker squares / 127 mg  Nutrient Analysis  · Calories: 1745  · Protein: 75 g  · Carbohydrate: 237 g  · Fat: 57 g  · Sodium: 1425 mg  Document Released: 07/12/2005 Document Revised: 10/04/2011 Document Reviewed: 10/13/2009  ExitCare® Patient Information ©2014 [employer].

## 2013-01-28 DIAGNOSIS — E039 Hypothyroidism, unspecified: Secondary | ICD-10-CM | POA: Insufficient documentation

## 2013-01-29 ENCOUNTER — Ambulatory Visit: Payer: BC Managed Care – PPO | Admitting: Family Medicine

## 2013-01-30 ENCOUNTER — Encounter: Payer: Self-pay | Admitting: Family Medicine

## 2013-02-26 ENCOUNTER — Ambulatory Visit: Payer: BC Managed Care – PPO | Admitting: Family Medicine

## 2013-02-26 ENCOUNTER — Ambulatory Visit (HOSPITAL_BASED_OUTPATIENT_CLINIC_OR_DEPARTMENT_OTHER): Payer: BC Managed Care – PPO

## 2013-02-26 DIAGNOSIS — Z0289 Encounter for other administrative examinations: Secondary | ICD-10-CM

## 2013-03-06 ENCOUNTER — Telehealth: Payer: Self-pay | Admitting: *Deleted

## 2013-03-06 NOTE — Telephone Encounter (Signed)
Copy of labs mailed to pt's home address and referral to her PCP to f/u on Lipids

## 2013-03-06 NOTE — Telephone Encounter (Signed)
Message copied by Granville Lewis on Tue Mar 06, 2013  9:55 AM ------      Message from: Allie Bossier      Created: Tue Mar 06, 2013  9:28 AM       Her lipids are no better. Can you please refer her to her FP/Int Med please?      Thanks ------

## 2013-03-06 NOTE — Telephone Encounter (Signed)
Copy of labs mailed to pt's home address with instructions to f/u with PCP.

## 2013-03-09 ENCOUNTER — Ambulatory Visit (HOSPITAL_BASED_OUTPATIENT_CLINIC_OR_DEPARTMENT_OTHER)
Admission: RE | Admit: 2013-03-09 | Discharge: 2013-03-09 | Disposition: A | Discharge status: Home / Self Care | Payer: BC Managed Care – PPO | Source: Ambulatory Visit | Attending: Obstetrics & Gynecology | Admitting: Obstetrics & Gynecology

## 2013-03-09 DIAGNOSIS — Z1231 Encounter for screening mammogram for malignant neoplasm of breast: Secondary | ICD-10-CM | POA: Insufficient documentation

## 2013-03-09 DIAGNOSIS — Z0000 Encounter for general adult medical examination without abnormal findings: Secondary | ICD-10-CM

## 2013-03-13 ENCOUNTER — Ambulatory Visit (INDEPENDENT_AMBULATORY_CARE_PROVIDER_SITE_OTHER): Payer: BC Managed Care – PPO | Admitting: Obstetrics & Gynecology

## 2013-03-13 ENCOUNTER — Other Ambulatory Visit: Payer: Self-pay | Admitting: Obstetrics & Gynecology

## 2013-03-13 ENCOUNTER — Encounter: Payer: Self-pay | Admitting: Obstetrics & Gynecology

## 2013-03-13 VITALS — BP 123/81 | HR 92 | Resp 16 | Ht 62.0 in | Wt 143.0 lb

## 2013-03-13 DIAGNOSIS — R928 Other abnormal and inconclusive findings on diagnostic imaging of breast: Secondary | ICD-10-CM

## 2013-03-13 DIAGNOSIS — N949 Unspecified condition associated with female genital organs and menstrual cycle: Secondary | ICD-10-CM

## 2013-03-13 DIAGNOSIS — N938 Other specified abnormal uterine and vaginal bleeding: Secondary | ICD-10-CM

## 2013-03-13 NOTE — Progress Notes (Signed)
  Subjective:    Patient ID: Brittney Vance, female    DOB: 06/03/1966, 47 y.o.   MRN: [id number]  HPI 47 yo lady who is here today because she had a period this month after not having a period since "January or February". She remembers Dr. Penne Lash telling her that she was in menopause and she should be concerned about any bleeding int future. She is here for that reason.   Review of Systems     Objective:   Physical Exam        Assessment & Plan:   perimenopause versus post menpause bleeding. I will check a FSH and if she is menopausal, then begin the w/u for PMB. Incomplete mammogram- She has a diagnostic mammogram /possible u/s of left breast scheduled for 03-30-13.

## 2013-03-14 ENCOUNTER — Telehealth: Payer: Self-pay | Admitting: *Deleted

## 2013-03-14 DIAGNOSIS — N950 Postmenopausal bleeding: Secondary | ICD-10-CM

## 2013-03-14 LAB — FOLLICLE STIMULATING HORMONE: FSH: 38.7 m[IU]/mL

## 2013-03-14 NOTE — Telephone Encounter (Signed)
Pt is scheduled for diagnostic breast imaging on 03/30/13 @ [facility] in [place].  Pt notified of elevated FSH and the need for a pelvic u/s per Dr Marice Potter.

## 2013-03-14 NOTE — Telephone Encounter (Signed)
Message copied by Granville Lewis on Wed Mar 14, 2013  8:41 AM ------      Message from: Lesly Dukes      Created: Wed Mar 14, 2013  8:27 AM       Pt needs follow breast studies.  Office to make sure that she is scheduled. ------

## 2013-03-15 ENCOUNTER — Ambulatory Visit (HOSPITAL_BASED_OUTPATIENT_CLINIC_OR_DEPARTMENT_OTHER)
Admission: RE | Admit: 2013-03-15 | Discharge: 2013-03-15 | Disposition: A | Discharge status: Home / Self Care | Payer: BC Managed Care – PPO | Source: Ambulatory Visit | Attending: Obstetrics & Gynecology | Admitting: Obstetrics & Gynecology

## 2013-03-15 DIAGNOSIS — N950 Postmenopausal bleeding: Secondary | ICD-10-CM

## 2013-03-16 ENCOUNTER — Ambulatory Visit: Payer: PRIVATE HEALTH INSURANCE | Admitting: Family Medicine

## 2013-03-19 ENCOUNTER — Telehealth: Payer: Self-pay | Admitting: *Deleted

## 2013-03-19 NOTE — Telephone Encounter (Signed)
Pt called in to ask what the next step is in the treatment plan since u/s was normal - will need to get with Dr. Marice Potter about this

## 2013-03-19 NOTE — Telephone Encounter (Signed)
LM on voicemail that her pelvic u/s was normal.

## 2013-03-29 ENCOUNTER — Encounter: Payer: Self-pay | Admitting: Obstetrics & Gynecology

## 2013-03-29 ENCOUNTER — Ambulatory Visit (INDEPENDENT_AMBULATORY_CARE_PROVIDER_SITE_OTHER): Payer: BC Managed Care – PPO | Admitting: Obstetrics & Gynecology

## 2013-03-29 VITALS — BP 154/104 | HR 85 | Resp 16 | Ht 62.0 in | Wt 144.0 lb

## 2013-03-29 DIAGNOSIS — N951 Menopausal and female climacteric states: Secondary | ICD-10-CM

## 2013-03-29 NOTE — Progress Notes (Signed)
Pt has nml [place].  No biopsy needed.  Pt went 9 months without a vaginal bleeding.  Perimenopausal to menopausal.  Pt told to return if bleeding is prolonged (several weeks), or bleeds several times in a month.  Pt should have been given these results over the phone and not asked to come in.  Will no charge this visit.

## 2013-03-30 ENCOUNTER — Ambulatory Visit
Admission: RE | Admit: 2013-03-30 | Discharge: 2013-03-30 | Disposition: A | Discharge status: Home / Self Care | Payer: BC Managed Care – PPO | Source: Ambulatory Visit | Attending: Obstetrics & Gynecology | Admitting: Obstetrics & Gynecology

## 2013-03-30 ENCOUNTER — Other Ambulatory Visit: Payer: Self-pay | Admitting: Obstetrics & Gynecology

## 2013-03-30 DIAGNOSIS — R928 Other abnormal and inconclusive findings on diagnostic imaging of breast: Secondary | ICD-10-CM

## 2013-04-02 ENCOUNTER — Encounter: Payer: Self-pay | Admitting: Obstetrics & Gynecology

## 2013-04-02 DIAGNOSIS — N6320 Unspecified lump in the left breast, unspecified quadrant: Secondary | ICD-10-CM | POA: Insufficient documentation

## 2013-04-05 ENCOUNTER — Ambulatory Visit: Payer: PRIVATE HEALTH INSURANCE | Admitting: Family Medicine

## 2013-04-06 ENCOUNTER — Other Ambulatory Visit: Payer: Self-pay | Admitting: Obstetrics & Gynecology

## 2013-04-06 ENCOUNTER — Ambulatory Visit
Admission: RE | Admit: 2013-04-06 | Discharge: 2013-04-06 | Disposition: A | Discharge status: Home / Self Care | Payer: BC Managed Care – PPO | Source: Ambulatory Visit | Attending: Obstetrics & Gynecology | Admitting: Obstetrics & Gynecology

## 2013-04-06 DIAGNOSIS — R928 Other abnormal and inconclusive findings on diagnostic imaging of breast: Secondary | ICD-10-CM

## 2013-04-09 ENCOUNTER — Other Ambulatory Visit: Payer: Self-pay | Admitting: Obstetrics & Gynecology

## 2013-04-09 ENCOUNTER — Ambulatory Visit
Admission: RE | Admit: 2013-04-09 | Discharge: 2013-04-09 | Disposition: A | Discharge status: Home / Self Care | Payer: BC Managed Care – PPO | Source: Ambulatory Visit | Attending: Obstetrics & Gynecology | Admitting: Obstetrics & Gynecology

## 2013-04-09 ENCOUNTER — Telehealth: Payer: Self-pay | Admitting: *Deleted

## 2013-04-09 ENCOUNTER — Encounter: Payer: Self-pay | Admitting: Obstetrics & Gynecology

## 2013-04-09 DIAGNOSIS — C801 Malignant (primary) neoplasm, unspecified: Secondary | ICD-10-CM | POA: Insufficient documentation

## 2013-04-09 DIAGNOSIS — R928 Other abnormal and inconclusive findings on diagnostic imaging of breast: Secondary | ICD-10-CM

## 2013-04-09 DIAGNOSIS — C50419 Malignant neoplasm of upper-outer quadrant of unspecified female breast: Secondary | ICD-10-CM | POA: Insufficient documentation

## 2013-04-09 DIAGNOSIS — C50412 Malignant neoplasm of upper-outer quadrant of left female breast: Secondary | ICD-10-CM

## 2013-04-09 NOTE — Telephone Encounter (Signed)
Confirmed [facility] for 04/18/13 at 0800.  Instructions and contact information given.

## 2013-04-11 ENCOUNTER — Encounter: Payer: Self-pay | Admitting: Family Medicine

## 2013-04-11 ENCOUNTER — Ambulatory Visit (INDEPENDENT_AMBULATORY_CARE_PROVIDER_SITE_OTHER): Payer: BC Managed Care – PPO | Admitting: Family Medicine

## 2013-04-11 VITALS — BP 158/100 | HR 82 | Wt 142.0 lb

## 2013-04-11 DIAGNOSIS — C50919 Malignant neoplasm of unspecified site of unspecified female breast: Secondary | ICD-10-CM

## 2013-04-11 DIAGNOSIS — I10 Essential (primary) hypertension: Secondary | ICD-10-CM

## 2013-04-11 DIAGNOSIS — Z23 Encounter for immunization: Secondary | ICD-10-CM

## 2013-04-11 LAB — BASIC METABOLIC PANEL WITHOUT GFR
BUN: 10 mg/dL (ref 6–23)
CO2: 29 meq/L (ref 19–32)
Calcium: 9.5 mg/dL (ref 8.4–10.5)
Chloride: 103 meq/L (ref 96–112)
Creat: 0.67 mg/dL (ref 0.50–1.10)
GFR, Est African American: >89 mL/min
GFR, Est Non African American: >89 mL/min
Glucose, Bld: 83 mg/dL (ref 70–99)
Potassium: 3.9 meq/L (ref 3.5–5.3)
Sodium: 136 meq/L (ref 135–145)

## 2013-04-11 MED ORDER — HYDROCHLOROTHIAZIDE 25 MG PO TABS: 25.0000 mg | ORAL_TABLET | Freq: Every day | ORAL | Status: DC

## 2013-04-11 NOTE — Progress Notes (Signed)
  Subjective:    Patient ID: Brittney Vance, female    DOB: Jan 01, 1966, 47 y.o.   MRN: [id number]  HPI HTN-took BP med for one month AND THEN STOPPED IT.  She felt well on and did not have any side effects. She just says she quit taking it. She does plan on buying a blood pressure cuff to follow at home. No chest pain or shortness of breath.  She found out on Monday that she has breast cancer. She is scheduled for an MRI on Friday and has a team meeting next Wednesday to discuss treatment options. She was not having any pain or problems and this was found on a routine screening mammogram. She says she's just in shock and doesn't quite know what to do. Her husband doesn't seem to really understand what is going on. She has told several friends and they have been very supportive. She has 2 young children, ages 30 and 52 and has not told them yet.   Review of Systems     Objective:   Physical Exam  Constitutional: She is oriented to person, place, and time. She appears well-developed and well-nourished.  HENT:  Head: Normocephalic and atraumatic.  Cardiovascular: Normal rate, regular rhythm and normal heart sounds.   Pulmonary/Chest: Effort normal and breath sounds normal.  Neurological: She is alert and oriented to person, place, and time.  Skin: Skin is warm and dry.  Psychiatric: She has a normal mood and affect. Her behavior is normal.          Assessment & Plan:  Hypertension-uncontrolled. Encouraged her restart her medication and followup in a month to recheck blood pressure make sure that she's responding to the medication appropriately. She does plan on buying her own home blood pressure cuff. I recommended an arm cuff, they tend to be more accurate. Next  Breast cancer-offered her support and encouragement. I do want her to make sure that she is reaching out to friends and family as needed for support. If she feels like she started to become depressed I encouraged her to call the office  and we can set her up for counseling. For now I think she's just in shock and wants to try get some things organized. Work note given for today and tomorrow. She has her MRI scheduled for Friday.  Given tdap today.

## 2013-04-12 NOTE — Progress Notes (Signed)
Quick Note:  All labs are normal. ______ 

## 2013-04-13 ENCOUNTER — Ambulatory Visit
Admission: RE | Admit: 2013-04-13 | Discharge: 2013-04-13 | Disposition: A | Discharge status: Home / Self Care | Payer: BC Managed Care – PPO | Source: Ambulatory Visit | Attending: Obstetrics & Gynecology | Admitting: Obstetrics & Gynecology

## 2013-04-13 ENCOUNTER — Ambulatory Visit: Payer: BC Managed Care – PPO | Admitting: Family Medicine

## 2013-04-13 MED ORDER — GADOBENATE DIMEGLUMINE 529 MG/ML IV SOLN
14.0000 mL | Freq: Once | INTRAVENOUS | Status: AC | PRN
  Administered 2013-04-13: 14 mL via INTRAVENOUS

## 2013-04-18 ENCOUNTER — Other Ambulatory Visit: Payer: BC Managed Care – PPO | Admitting: Lab

## 2013-04-18 ENCOUNTER — Ambulatory Visit: Payer: BC Managed Care – PPO

## 2013-04-18 ENCOUNTER — Ambulatory Visit: Payer: BC Managed Care – PPO | Attending: General Surgery | Admitting: Physical Therapy

## 2013-04-18 ENCOUNTER — Telehealth: Payer: Self-pay | Admitting: *Deleted

## 2013-04-18 ENCOUNTER — Ambulatory Visit (HOSPITAL_BASED_OUTPATIENT_CLINIC_OR_DEPARTMENT_OTHER): Payer: BC Managed Care – PPO | Admitting: General Surgery

## 2013-04-18 ENCOUNTER — Ambulatory Visit (HOSPITAL_BASED_OUTPATIENT_CLINIC_OR_DEPARTMENT_OTHER): Payer: BC Managed Care – PPO | Admitting: Oncology

## 2013-04-18 ENCOUNTER — Encounter: Payer: Self-pay | Admitting: Oncology

## 2013-04-18 ENCOUNTER — Encounter (INDEPENDENT_AMBULATORY_CARE_PROVIDER_SITE_OTHER): Payer: Self-pay | Admitting: General Surgery

## 2013-04-18 ENCOUNTER — Ambulatory Visit
Admission: RE | Admit: 2013-04-18 | Discharge: 2013-04-18 | Disposition: A | Discharge status: Home / Self Care | Payer: BC Managed Care – PPO | Source: Ambulatory Visit | Attending: Radiation Oncology | Admitting: Radiation Oncology

## 2013-04-18 ENCOUNTER — Encounter: Payer: Self-pay | Admitting: *Deleted

## 2013-04-18 VITALS — BP 140/90 | HR 96 | Temp 98.9°F | Resp 20 | Ht 62.0 in | Wt 142.4 lb

## 2013-04-18 DIAGNOSIS — R293 Abnormal posture: Secondary | ICD-10-CM | POA: Insufficient documentation

## 2013-04-18 DIAGNOSIS — C50412 Malignant neoplasm of upper-outer quadrant of left female breast: Secondary | ICD-10-CM

## 2013-04-18 DIAGNOSIS — C50912 Malignant neoplasm of unspecified site of left female breast: Secondary | ICD-10-CM

## 2013-04-18 DIAGNOSIS — C50919 Malignant neoplasm of unspecified site of unspecified female breast: Secondary | ICD-10-CM

## 2013-04-18 DIAGNOSIS — IMO0001 Reserved for inherently not codable concepts without codable children: Secondary | ICD-10-CM | POA: Insufficient documentation

## 2013-04-18 NOTE — Progress Notes (Signed)
Checked in new pt with no financial concerns. °

## 2013-04-18 NOTE — Telephone Encounter (Signed)
appts made and printed. Pt is aware that tx will be added. i emailed MW to add the tx's...td 

## 2013-04-18 NOTE — Patient Instructions (Signed)
My office will call and arrange for the Port-A-Cath insertion

## 2013-04-18 NOTE — Progress Notes (Signed)
IDGunnar Fusi [place] OB: February 03, 1966  MR#: [id number]  NWG#:[id number]  PCP: Nani Gasser, MD GYN:  Grant Ruts SU: Avel Peace OTHER MD: Hal Neer  CHIEF COMPLAINT: "I have breast cancer"  HISTORY OF PRESENT ILLNESS: Alexandra had routine screening mammography 03/09/2013 showing heterogeneously dense breasts, and a possible asymmetry in the left breast. Diagnostic left mammography and ultrasonography at the breast Center 03/30/2013 found a lobulated mass in the left breast upper outer quadrant measuring 1.6 cm. This was palpable. Ultrasound showed a complex microlobulated hypoechoic mass measuring 1.8 cm. There was no left axillary lymphadenopathy noted.  Biopsy of the mass in question 04/06/2013 showed ([id number]) and invasive ductal carcinoma, grade 3, triple negative, with an MIB-1 of 65%.  Bilateral breast MRI 04/13/2013 found an irregular mass measuring 2.5 cm in the left breast with a few lymph nodes that showed moderate cortical thickness, the largest measuring 12 mm (level I).  The patient's subsequent history is as detailed below  INTERVAL HISTORY: Naiyah was seen in the multidisciplinary breast cancer clinic 04/18/2013 accompanied by her husband [name].   REVIEW OF SYSTEMS: There were no specific symptoms leading to the patient's mammography, which was routinely scheduled. Victoriana feels anxious today. She is having some problems sleeping. She tells me her blood pressure is "usually okay". Otherwise a detailed review of systems today was entirely negative. She does not exercise regularly.  PAST MEDICAL HISTORY: Past Medical History  Diagnosis Date  . Fibroids   . Hypertension   . Hypothyroid   . Breast cancer     PAST SURGICAL HISTORY: Past Surgical History  Procedure Laterality Date  . Tubal ligation      FAMILY HISTORY Family History  Problem Relation Age of Onset  . Hypertension Mother   . Diabetes Mother   . Alzheimer's disease Mother   . Hypertension  Father   . Diabetes Father   . Hypertension Brother    the patient's father died in his 60s from complications of diabetes. The patient's mother is living, in her mid 75s. The patient had 2 brothers, and 2 sisters. There is no history of cancer in the family to her knowledge.  GYNECOLOGIC HISTORY:  Menarche age 75, first live birth age 61. The patient is GX P2. She is postmenopausal, but did have some perimenopausal bleeding, which was evaluated age 75 08/14/2012 with a transabdominal and transvaginal ultrasound of the pelvis which found a normal uterine myometrium and left ovary. The right ovary could not be visualized.  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 Her husband [name] also works for the [employer] system. Their children are [name], 8, and [place], 5. The patient attends a [employer]   ADVANCED DIRECTIVES: Not in place   HEALTH MAINTENANCE: History  Substance Use Topics  . Smoking status: Never Smoker   . Smokeless tobacco: Never Used  . Alcohol Use: 1.2 oz/week    2 Glasses of wine per week     Comment: occassion     Colonoscopy:-  PAP: 2013  Bone density:-  Lipid panel:  No Known Allergies  Current Outpatient Prescriptions  Medication Sig Dispense Refill  . hydrochlorothiazide (HYDRODIURIL) 25 MG tablet Take 1 tablet (25 mg total) by mouth daily.  30 tablet  2  . levothyroxine (SYNTHROID, LEVOTHROID) 88 MCG tablet Take 1 tablet (88 mcg total) by mouth daily.  31 tablet  12  . Multiple Vitamins-Minerals (MULTIVITAMIN WITH MINERALS) tablet Take 1 tablet by mouth  daily.       No current facility-administered medications for this visit.    OBJECTIVE: [name] in no acute distress Filed Vitals:   04/18/13 0831  BP: 140/90  Pulse: 96  Temp: 98.9 F (37.2 C)  Resp: 20     Body mass index is 26.04 kg/(m^2).    ECOG FS:0 - Asymptomatic  Ocular: Sclerae unicteric, pupils equal, round  and reactive to light Ear-nose-throat: Oropharynx clear, dentition fair Lymphatic: No cervical or supraclavicular adenopathy Lungs no rales or rhonchi, good excursion bilaterally Heart regular rate and rhythm, no murmur appreciated Abd soft, nontender, positive bowel sounds MSK no focal spinal tenderness, no joint edema Neuro: non-focal, well-oriented, appropriate affect Breasts: The right breast is status post recent biopsy. Aside from changes of the biopsy itself, I do not palpate any well defined masses and there are no skin or nipple findings of concern. The right axilla is benign. The left breast is unremarkable.   LAB RESULTS:  CMP     Component Value Date/Time   NA 136 04/11/2013 0927   K 3.9 04/11/2013 0927   CL 103 04/11/2013 0927   CO2 29 04/11/2013 0927   GLUCOSE 83 04/11/2013 0927   BUN 10 04/11/2013 0927   CREATININE 0.67 04/11/2013 0927   CREATININE 0.65 05/21/2009 2243   CALCIUM 9.5 04/11/2013 0927   PROT 7.3 01/19/2013 0910   ALBUMIN 4.2 01/19/2013 0910   AST 21 01/19/2013 0910   ALT 20 01/19/2013 0910   ALKPHOS 70 01/19/2013 0910   BILITOT 0.4 01/19/2013 0910   GFRNONAA >60 09/08/2007 1324   GFRAA  Value: >60        The eGFR has been calculated using the MDRD equation. This calculation has not been validated in all clinical 09/08/2007 1324    I No results found for this basename: SPEP, UPEP,  kappa and lambda light chains    Lab Results  Component Value Date   WBC 4.1 01/19/2013   HGB 13.2 01/19/2013   HCT 38.4 01/19/2013   MCV 85.3 01/19/2013   PLT 305 01/19/2013      Chemistry      Component Value Date/Time   NA 136 04/11/2013 0927   K 3.9 04/11/2013 0927   CL 103 04/11/2013 0927   CO2 29 04/11/2013 0927   BUN 10 04/11/2013 0927   CREATININE 0.67 04/11/2013 0927   CREATININE 0.65 05/21/2009 2243      Component Value Date/Time   CALCIUM 9.5 04/11/2013 0927   ALKPHOS 70 01/19/2013 0910   AST 21 01/19/2013 0910   ALT 20 01/19/2013 0910   BILITOT 0.4 01/19/2013 0910        No results found for this basename: LABCA2    No components found with this basename: LABCA125    No results found for this basename: INR,  in the last 168 hours  Urinalysis    Component Value Date/Time   COLORURINE YELLOW 09/17/2007 2215   APPEARANCEUR CLOUDY* 09/17/2007 2215   LABSPEC 1.010 10/02/2007 1101   PHURINE 6.5 10/02/2007 1101   GLUCOSEU NEGATIVE 10/02/2007 1101   HGBUR NEGATIVE 10/02/2007 1101   BILIRUBINUR NEGATIVE 10/02/2007 1101   KETONESUR NEGATIVE 10/02/2007 1101   PROTEINUR NEGATIVE 10/02/2007 1101   UROBILINOGEN 1.0 10/02/2007 1101   NITRITE NEGATIVE 10/02/2007 1101   LEUKOCYTESUR TRACE Biochemical Testing Only. Please order routine urinalysis from main lab if confirmatory testing is needed.* 10/02/2007 1101    STUDIES: US Breast Left  03/30/2013   *  RADIOLOGY REPORT*  Clinical Data:  Screening callback for questioned left breast mass  DIGITAL DIAGNOSTIC LEFT MAMMOGRAM WITH CAD AND LEFT BREAST ULTRASOUND:  Comparison:  Prior exams  Findings:  ACR Breast Density Category d:  The breast tissue is extremely dense, which lowers the sensitivity of mammography.  There is a lobulated 1.6 cm mass in the left upper outer quadrant, corresponding to the screening mammographic finding.  Mammographic images were processed with CAD.  On physical exam, I palpate a firm mass in the left breast two o'clock location.  Ultrasound is performed, showing a complex micro lobulated hypoechoic mass left breast two o'clock location 6 cm from the nipple measuring 1.8 x 1.3 x 1.1 cm.  There are anechoic internal areas indicating a cystic component, but there is a suggestion of low-level internal echoes or possible peripheral nodular solid component.  Mild internal vascularity is noted.  No left axillary lymphadenopathy.  IMPRESSION: Suspicious left breast mass 2 o'clock location.  Ultrasound-guided biopsy is recommended and will be scheduled 04/06/2013 according to the reference of the patient.  RECOMMENDATION:  Left ultrasound-guided core biopsy  I have discussed the findings and recommendations with the patient. Results were also provided in writing at the conclusion of the visit.  If applicable, a reminder letter will be sent to the patient regarding the next appointment.  BI-RADS CATEGORY 4:  Suspicious abnormality - biopsy should be considered.   Original Report Authenticated By: Christiana Pellant, M.D.   Mr Breast Bilateral W Wo Contrast  04/13/2013   *RADIOLOGY REPORT*  Clinical Data:  new diagnosis of left breast cancer  BILATERAL BREAST MRI WITH AND WITHOUT CONTRAST  Technique: Multiplanar, multisequence MR images of both breasts were obtained prior to and following the intravenous administration of 14ml of Multihance.  THREE-DIMENSIONAL MR IMAGE RENDERING ON INDEPENDENT WORKSTATION: Three-dimensional MR images were rendered by post-processing  of the original MR data on an independent DynaCad workstation. The three-dimensional MR images were interpreted, and findings are reported in the following complete MRI report for this study.  Comparison:  multiple recent imaging examinations including mammography and ultrasound  FINDINGS:  Breast composition:  c:  Heterogeneous fibroglandular tissue  Background parenchymal enhancement: Marked  Right breast:  No mass or abnormal enhancement.  Left breast:  Associated with a marker clip, there is an irregular mass with irregular borders in the posterior 2:00 position of the left breast.  It measures 25 x 19 x 20mm and demonstrates a mixture of kinetics.  There is enhancement along the biopsy tract with no other suspcious findings.  Lymph nodes:  There are normal right axillary lymph nodes.  In the left axilla, there are no enlarged lymph nodes, and the majority of nodes demonstrate an unremarkable appearance.  There are a few lymp nodes that demonstrate moderate cortical thickness, the largest measuring 12mm (level I) with another 11mm subpectoral node showing a similar  appearance.  Ancillary findings:  None.  IMPRESSION: Biopsy-proven invasive carcinoma left breast.  There are indeterminate left axillary lymph nodes, none of which are enlarged, but some of which demonstrate  some degree of cortical thickening.  BI-RADS CATEGORY 6:  Known biopsy-proven malignancy - appropriate action should be taken.   Original Report Authenticated By: Esperanza Heir, M.D.   Mm Digital Diag Ltd L  03/30/2013   *RADIOLOGY REPORT*  Clinical Data:  Screening callback for questioned left breast mass  DIGITAL DIAGNOSTIC LEFT MAMMOGRAM WITH CAD AND LEFT BREAST ULTRASOUND:  Comparison:  Prior exams  Findings:  ACR  Breast Density Category d:  The breast tissue is extremely dense, which lowers the sensitivity of mammography.  There is a lobulated 1.6 cm mass in the left upper outer quadrant, corresponding to the screening mammographic finding.  Mammographic images were processed with CAD.  On physical exam, I palpate a firm mass in the left breast two o'clock location.  Ultrasound is performed, showing a complex micro lobulated hypoechoic mass left breast two o'clock location 6 cm from the nipple measuring 1.8 x 1.3 x 1.1 cm.  There are anechoic internal areas indicating a cystic component, but there is a suggestion of low-level internal echoes or possible peripheral nodular solid component.  Mild internal vascularity is noted.  No left axillary lymphadenopathy.  IMPRESSION: Suspicious left breast mass 2 o'clock location.  Ultrasound-guided biopsy is recommended and will be scheduled 04/06/2013 according to the reference of the patient.  RECOMMENDATION: Left ultrasound-guided core biopsy  I have discussed the findings and recommendations with the patient. Results were also provided in writing at the conclusion of the visit.  If applicable, a reminder letter will be sent to the patient regarding the next appointment.  BI-RADS CATEGORY 4:  Suspicious abnormality - biopsy should be considered.   Original  Report Authenticated By: Christiana Pellant, M.D.   Mm Digital Diagnostic Unilat L  04/09/2013   **ADDENDUM** CREATED: 04/09/2013 13:31:38  *RADIOLOGY REPORT*  ESTABLISHED PATIENT OFFICE VISIT - LEVEL II ([id number])  Chief Complaint:  Left breast ultrasound guided biopsy.  History:  Patient status post left breast ultrasound guided biopsy with pathology demonstrating invasive ductal carcinoma.  Exam:  The patient's left breast biopsy site is clean and intact. Mild surrounding ecchymosis is noted.  The patient is mildly tender.  Pathology: Invasive ductal carcinoma  Assessment and Plan:  Patient with new diagnosis of invasive ductal carcinoma.  The patient is scheduled for multidisciplinary clinic on 04/18/2013 and for a bilateral breast MRI on 04/13/2013.  Addended by:  Jerene Dilling, M.D. on 04/09/2013 13:31:38.  **END ADDENDUM** SIGNED BY: Jerene Dilling, M.D.  04/06/2013   *RADIOLOGY REPORT*  Clinical Data:  Status post ultrasound guided core biopsy left breast mass  DIGITAL DIAGNOSTIC LEFT MAMMOGRAM  Comparison:  Previous exams.  Findings:  Mammographic images were obtained following left breast ultrasound guided biopsy of mass at 2 o'clock position.  CC and lateral views of the left breast demonstrate biopsy clip in the mass of concern.  IMPRESSION: Post biopsy mammogram demonstrating biopsy clip in the mass of concern.  Final Assessment:  Post Procedure Mammograms for Marker Placement   Original Report Authenticated By: Sherian Rein, M.D.   Mm Radiologist Eval And Mgmt  04/12/2013   *RADIOLOGY REPORT*  ESTABLISHED PATIENT OFFICE VISIT - LEVEL II [phone])  Chief Complaint:  Left breast ultrasound guided biopsy.  History:  Patient status post left breast ultrasound guided biopsy with pathology  demonstrating invasive ductal carcinoma.  Exam:  The patient's left breast biopsy site is clean and intact. Mild surrounding  ecchymosis is noted.  The patient is mildly tender.  Pathology: Invasive ductal  carcinoma  Assessment and Plan:  Patient with new diagnosis of invasive ductal carcinoma.  The  patient is scheduled for multidisciplinary clinic on 04/18/2013 and for a bilateral  breast MRI on 04/13/2013.   Original Report Authenticated By: Jerene Dilling   [place] Lt Breast Bx W Loc Dev 1st Lesion Img Bx Spec US Guide  04/09/2013   **ADDENDUM** CREATED: 04/09/2013 10:04:12  Histologic evaluation demonstrates invasive ductal carcinoma that  is likely grade III.  This is concordant with the imaging findings. The patient was initially scheduled to come in to the office to discuss the results but called to discuss the results by telephone as she could not come to the office today.  She was informed of the results of the biopsy.  She states that she has no complications from the procedure.  The patient will be referred to the [facility] on 04/18/2013.  Breast MRI will be scheduled at a convenient time for the patient prior to the Clinic appointment.  **END ADDENDUM** SIGNED BY: Cain Saupe, M.D.  04/06/2013   *RADIOLOGY REPORT*  Clinical Data:  Suspicious mass left breast for biopsy  ULTRASOUND GUIDED CORE BIOPSY OF THE left BREAST  Comparison: Previous exams.  I met with the patient and we discussed the procedure of ultrasound- guided biopsy, including benefits and alternatives.  We discussed the high likelihood of a successful procedure. We discussed the risks of the procedure, including infection, bleeding, tissue injury, clip migration, and inadequate sampling.  Informed written consent was given. The usual time-out protocol was performed immediately prior to the procedure.  Using sterile technique and 2% Lidocaine as local anesthetic, under direct ultrasound visualization, a 14 gauge  device was used to perform biopsy of hypoechoic mass at the left breast two o'clock position using a lateral approach.  At the conclusion of the procedure a ribbon tissue marker clip was  deployed into the biopsy cavity.  Follow up 2 view mammogram was performed and dictated separately.  IMPRESSION: Ultrasound guided biopsy of left breast.  No apparent complications.  Original Report Authenticated By: Sherian Rein, M.D.    ASSESSMENT: 47 y.o. [place] woman status post left breast upper outer quadrant biopsy 04/06/2013 for a clinical T2 N1, stage IIB invasive ductal carcinoma, high-grade him a triple negative, with an MIB-1 of 65%  PLAN: We spent the better part of today's hour-long appointment discussing the biology of breast cancer in general, and the specifics of the patient's tumor in particular. Reighlynn understands our recommendation is for breast conservation, and in that regard it will be helpful to start with neoadjuvant chemotherapy, which will make the surgery easier and the ultimate cosmetic result better. In addition, she would have a one out of 3 or one out of four chance of achieving a complete pathologic response, which would have prognostic significance.  We do need to biopsy the largest suspicious lymph node noted by MRI because this might effect her future decisions regarding radiation. It might also affect her potential to participate in some studies down the line, but that was a secondary consideration. Renae Fickle understands she will need an echocardiogram and a port and these are being operationalized. 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 She will also benefit from genetics counseling and this will be scheduled sometime within the next month or 2.  Our target start date for chemotherapy is October 6. She will receive doxorubicin and cyclophosphamide at standard doses, every 2 weeks x4, with Neulasta support. That will be followed by weekly carboplatin and paclitaxel, x12, a gain at standard doses. She will see me again October 3, at which point we will again review her situation and right her anti-nausea medications.  Renae Fickle has a good understanding of the  overall plan. She understands the intent of treatment is cure. She knows to call for any problems that may develop before her next visit here.  Lowella Dell, MD   04/18/2013 11:35  AM

## 2013-04-18 NOTE — Progress Notes (Addendum)
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 female  who was found to have a left breast mass on screening mammogram. At time of mammography this mass is actually palpable. Her son showed a 1.8 x 1.3 x 1.1 cm mass of. MRI confirmed a 2.5 x 1.9 x 2.0 cm. Mildly prominent axillary and subpectoral lymph nodes were noted measuring up to 1.2 cm. 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 Ki-67 was 65%. She presented to multidisciplinary clinic today with her husband. She has elected for neoadjuvant chemotherapy for an attempt at breast conservation and discussed this with Dr. Darnelle Catalan already. She has recovered well from her biopsy. She is not in any pain. She denies feeling this mass. She denies any breast pain or discharge.  RADIATION THERAPY: No  PAST MEDICAL HISTORY:  has a past medical history of Fibroids; Hypertension; and Hypothyroid.    PAST SURGICAL HISTORY: Past Surgical History  Procedure Laterality Date  . Tubal ligation      FAMILY HISTORY:  Family History  Problem Relation Age of Onset  . Hypertension Mother   . Hypertension Father   . Hypertension Brother   . Diabetes Mother   . Diabetes Father   . Alzheimer's disease Mother     SOCIAL HISTORY:  History  Substance Use Topics  . Smoking status: Never Smoker   . Smokeless tobacco: Never Used  . Alcohol Use: Yes     Comment: occassion    ALLERGIES: Review of patient's allergies indicates no known allergies.  MEDICATIONS:  Current Outpatient Prescriptions  Medication Sig Dispense Refill  . hydrochlorothiazide (HYDRODIURIL) 25 MG tablet Take 1 tablet (25 mg total) by mouth daily.   30 tablet  2  . levothyroxine (SYNTHROID, LEVOTHROID) 88 MCG tablet Take 1 tablet (88 mcg total) by mouth daily.  31 tablet  12  . Multiple Vitamins-Minerals (MULTIVITAMIN WITH MINERALS) tablet Take 1 tablet by mouth daily.       No current facility-administered medications for this encounter.    REVIEW OF SYSTEMS:  A 15 point review of systems is documented in the electronic medical record. This was obtained by the nursing staff. However, I reviewed this with the patient to discuss relevant findings and make appropriate changes.  Pertinent items are noted in HPI.  PHYSICAL EXAM:  She is a palpable mass in the upper outer quadrant of the left breast with a biopsy site. No palpable masses in the right breast. No palpable axillary adenopathy bilaterally. No palpable supra-clavicular or cervical adenopathy. She is alert and oriented x3. She has 5 out of 5 strength bilaterally.  LABORATORY DATA:  Lab Results  Component Value Date   WBC 4.1 01/19/2013   HGB 13.2 01/19/2013   HCT 38.4 01/19/2013   MCV 85.3 01/19/2013   PLT 305 01/19/2013   Lab Results  Component Value Date   NA 136 04/11/2013   K 3.9 04/11/2013   CL 103 04/11/2013   CO2 29 04/11/2013   Lab Results  Component Value Date   ALT 20 01/19/2013   AST 21 01/19/2013   ALKPHOS 70 01/19/2013   BILITOT 0.4 01/19/2013     RADIOGRAPHY: US Breast Left  03/30/2013   *RADIOLOGY REPORT*  Clinical Data:  Screening callback for questioned left breast mass  DIGITAL DIAGNOSTIC LEFT MAMMOGRAM WITH CAD AND LEFT BREAST ULTRASOUND:  Comparison:  Prior exams  Findings:  ACR Breast Density Category d:  The breast tissue is extremely dense, which lowers the sensitivity of mammography.  There is a lobulated 1.6 cm mass in the left upper outer quadrant, corresponding to the screening mammographic finding.  Mammographic images were processed with CAD.  On physical exam, I palpate a firm mass in the left breast two o'clock location.  Ultrasound is performed,  showing a complex micro lobulated hypoechoic mass left breast two o'clock location 6 cm from the nipple measuring 1.8 x 1.3 x 1.1 cm.  There are anechoic internal areas indicating a cystic component, but there is a suggestion of low-level internal echoes or possible peripheral nodular solid component.  Mild internal vascularity is noted.  No left axillary lymphadenopathy.  IMPRESSION: Suspicious left breast mass 2 o'clock location.  Ultrasound-guided biopsy is recommended and will be scheduled 04/06/2013 according to the reference of the patient.  RECOMMENDATION: Left ultrasound-guided core biopsy  I have discussed the findings and recommendations with the patient. Results were also provided in writing at the conclusion of the visit.  If applicable, a reminder letter will be sent to the patient regarding the next appointment.  BI-RADS CATEGORY 4:  Suspicious abnormality - biopsy should be considered.   Original Report Authenticated By: Christiana Pellant, M.D.   Mr Breast Bilateral W Wo Contrast  04/13/2013   *RADIOLOGY REPORT*  Clinical Data:  new diagnosis of left breast cancer  BILATERAL BREAST MRI WITH AND WITHOUT CONTRAST  Technique: Multiplanar, multisequence MR images of both breasts were obtained prior to and following the intravenous administration of 14ml of Multihance.  THREE-DIMENSIONAL MR IMAGE RENDERING ON INDEPENDENT WORKSTATION: Three-dimensional MR images were rendered by post-processing  of the original MR data on an independent DynaCad workstation. The three-dimensional MR images were interpreted, and findings are reported in the following complete MRI report for this study.  Comparison:  multiple recent imaging examinations including mammography and ultrasound  FINDINGS:  Breast composition:  c:  Heterogeneous fibroglandular tissue  Background parenchymal enhancement: Marked  Right breast:  No mass or abnormal enhancement.  Left breast:  Associated with a marker clip, there is an irregular mass  with irregular borders in the posterior 2:00 position of the left breast.  It measures 25 x 19 x 20mm and demonstrates a mixture of kinetics.  There is enhancement along the biopsy tract with no other suspcious findings.  Lymph nodes:  There are normal right axillary lymph nodes.  In the left axilla, there are no enlarged lymph nodes, and the majority of nodes demonstrate an unremarkable appearance.  There are a few lymp nodes that demonstrate moderate cortical thickness, the largest measuring 12mm (level I) with another 11mm subpectoral node showing a similar appearance.  Ancillary findings:  None.  IMPRESSION: Biopsy-proven invasive carcinoma left breast.  There are indeterminate left axillary lymph nodes, none of which are enlarged, but some of which demonstrate  some degree of cortical thickening.  BI-RADS CATEGORY 6:  Known biopsy-proven malignancy - appropriate action should be taken.   Original Report Authenticated By: Esperanza Heir, M.D.   Mm Digital Diag Ltd L  03/30/2013   *RADIOLOGY REPORT*  Clinical Data:  Screening callback for questioned left breast mass  DIGITAL DIAGNOSTIC LEFT MAMMOGRAM WITH CAD AND LEFT BREAST ULTRASOUND:  Comparison:  Prior exams  Findings:  ACR Breast Density Category d:  The breast tissue is extremely dense, which lowers the sensitivity of mammography.  There is a lobulated 1.6 cm mass in the left upper outer quadrant, corresponding to the screening mammographic finding.  Mammographic images were processed with CAD.  On physical exam, I palpate a firm mass in the left breast two o'clock location.  Ultrasound is performed, showing a complex micro lobulated hypoechoic mass left breast two o'clock location 6 cm from the nipple measuring 1.8 x 1.3 x 1.1 cm.  There are anechoic internal areas indicating a cystic component, but there is a suggestion of low-level internal echoes or possible peripheral nodular solid component.  Mild internal vascularity is noted.  No left axillary  lymphadenopathy.  IMPRESSION: Suspicious left breast mass 2 o'clock location.  Ultrasound-guided biopsy is recommended and will be scheduled 04/06/2013 according to the reference of the patient.  RECOMMENDATION: Left ultrasound-guided core biopsy  I have discussed the findings and recommendations with the patient. Results were also provided in writing at the conclusion of the visit.  If applicable, a reminder letter will be sent to the patient regarding the next appointment.  BI-RADS CATEGORY 4:  Suspicious abnormality - biopsy should be considered.   Original Report Authenticated By: Christiana Pellant, M.D.   Mm Digital Diagnostic Unilat L  04/09/2013   **ADDENDUM** CREATED: 04/09/2013 13:31:38  *RADIOLOGY REPORT*  ESTABLISHED PATIENT OFFICE VISIT - LEVEL II ([id number])  Chief Complaint:  Left breast ultrasound guided biopsy.  History:  Patient status post left breast ultrasound guided biopsy with pathology demonstrating invasive ductal carcinoma.  Exam:  The patient's left breast biopsy site is clean and intact. Mild surrounding ecchymosis is noted.  The patient is mildly tender.  Pathology: Invasive ductal carcinoma  Assessment and Plan:  Patient with new diagnosis of invasive ductal carcinoma.  The patient is scheduled for multidisciplinary clinic on 04/18/2013 and for a bilateral breast MRI on 04/13/2013.  Addended by:  Jerene Dilling, M.D. on 04/09/2013 13:31:38.  **END ADDENDUM** SIGNED BY: Jerene Dilling, M.D.  04/06/2013   *RADIOLOGY REPORT*  Clinical Data:  Status post ultrasound guided core biopsy left breast mass  DIGITAL DIAGNOSTIC LEFT MAMMOGRAM  Comparison:  Previous exams.  Findings:  Mammographic images were obtained following left breast ultrasound guided biopsy of mass at 2 o'clock position.  CC and lateral views of the left breast demonstrate biopsy clip in the mass of concern.  IMPRESSION: Post biopsy mammogram demonstrating biopsy clip in the mass of concern.  Final Assessment:  Post  Procedure Mammograms for Marker Placement   Original Report Authenticated By: Sherian Rein, M.D.   Mm Radiologist Eval [facility] Mgmt  04/12/2013   *RADIOLOGY REPORT*  ESTABLISHED PATIENT OFFICE VISIT - LEVEL II [phone])  Chief Complaint:  Left breast ultrasound guided biopsy.  History:  Patient status post left breast ultrasound guided biopsy with pathology  demonstrating invasive ductal carcinoma.  Exam:  The patient's left breast biopsy site is clean and intact. Mild surrounding  ecchymosis is noted.  The patient is mildly tender.  Pathology: Invasive ductal carcinoma  Assessment and Plan:  Patient with new diagnosis of invasive ductal carcinoma.  The  patient is scheduled for multidisciplinary clinic on 04/18/2013 and for a bilateral  breast MRI on 04/13/2013.   Original Report Authenticated By: Jerene Dilling   [place] Lt Breast Bx W Loc Dev 1st Lesion Img Bx Spec US Guide  04/09/2013   **ADDENDUM** CREATED: 04/09/2013 10:04:12  Histologic evaluation demonstrates invasive  ductal carcinoma that is likely grade III.  This is concordant with the imaging findings. The patient was initially scheduled to come in to the office to discuss the results but called to discuss the results by telephone as she could not come to the office today.  She was informed of the results of the biopsy.  She states that she has no complications from the procedure.  The patient will be referred to the [facility] on 04/18/2013.  Breast MRI will be scheduled at a convenient time for the patient prior to the Clinic appointment.  **END ADDENDUM** SIGNED BY: Cain Saupe, M.D.  04/06/2013   *RADIOLOGY REPORT*  Clinical Data:  Suspicious mass left breast for biopsy  ULTRASOUND GUIDED CORE BIOPSY OF THE left BREAST  Comparison: Previous exams.  I met with the patient and we discussed the procedure of ultrasound- guided biopsy, including benefits and alternatives.  We discussed the high likelihood of a  successful procedure. We discussed the risks of the procedure, including infection, bleeding, tissue injury, clip migration, and inadequate sampling.  Informed written consent was given. The usual time-out protocol was performed immediately prior to the procedure.  Using sterile technique and 2% Lidocaine as local anesthetic, under direct ultrasound visualization, a 14 gauge  device was used to perform biopsy of hypoechoic mass at the left breast two o'clock position using a lateral approach.  At the conclusion of the procedure a ribbon tissue marker clip was deployed into the biopsy cavity.  Follow up 2 view mammogram was performed and dictated separately.  IMPRESSION: Ultrasound guided biopsy of left breast.  No apparent complications.  Original Report Authenticated By: Sherian Rein, M.D.      IMPRESSION: T2 N0 possibly N1 grade 3 triple negative left breast cancer  PLAN: We discussed the equivalency in terms of survival between mastectomy and breast conservation. We discussed the role of radiation in decreasing local failures in patients that have high risk factors on mastectomy or that have lumpectomy. We discussed the necessity of upfront biopsy to document node positivity and she has been scheduled for this MRI biopsy. We discussed the process of simulation and the placement tattoos. We discussed 6 weeks of treatment as an outpatient. We discussed likely side effects treatment including but not limited to skin redness and darkening. We discussed the use of breath hold technique for cardiac sparing. I will plan on seeing her back either at the end of her chemotherapy if she is considering mastectomy if she is node positive or after her surgery if she has a lumpectomy regardless of her node status. I spent 40 minutes  face to face with the patient and more than 50% of that time was spent in counseling and/or coordination of care.   ------------------------------------------------  Lurline Hare,  MD

## 2013-04-18 NOTE — Telephone Encounter (Signed)
Per staff message and POF I have scheduled appts.  JMW  

## 2013-04-18 NOTE — Progress Notes (Signed)
Patient ID: Brittney Vance, female   DOB: 09/18/1965, 47 y.o.   MRN: [id number]  No chief complaint on file.   HPI Brittney Vance is a 47 y.o. female.   HPI  She is referred by Dr. Penne Lash for further evaluation and treatment of newly diagnosed invasive left breast cancer the is triple negative with a proliferation rate of 65%.  She was noted to have left breast asymmetry and a mammogram  demonstrated a 1.6 cm lobulated mass anteriorly in the left upper outer quadrant. This was palpable at the 2:00 position. Ultrasound demonstrated the lesion to be 1.8 cm in size. Image guided biopsy was performed with results as above. MRI shows the lesion to be 2.5 cm. There are indeterminate left axillary lymph nodes which are borderline enlarged.  No family history of breast cancer. Age at menarche was 78. Age at first live birth was 88. She is not having periods. No breast complaints.  Past Medical History  Diagnosis Date  . Fibroids   . Hypertension   . Hypothyroid   . Breast cancer     Past Surgical History  Procedure Laterality Date  . Tubal ligation      Family History  Problem Relation Age of Onset  . Hypertension Mother   . Diabetes Mother   . Alzheimer's disease Mother   . Hypertension Father   . Diabetes Father   . Hypertension Brother     Social History History  Substance Use Topics  . Smoking status: Never Smoker   . Smokeless tobacco: Never Used  . Alcohol Use: 1.2 oz/week    2 Glasses of wine per week     Comment: occassion    No Known Allergies  Current Outpatient Prescriptions  Medication Sig Dispense Refill  . hydrochlorothiazide (HYDRODIURIL) 25 MG tablet Take 1 tablet (25 mg total) by mouth daily.  30 tablet  2  . levothyroxine (SYNTHROID, LEVOTHROID) 88 MCG tablet Take 1 tablet (88 mcg total) by mouth daily.  31 tablet  12  . Multiple Vitamins-Minerals (MULTIVITAMIN WITH MINERALS) tablet Take 1 tablet by mouth daily.       No current facility-administered medications  for this visit.    Review of Systems Review of Systems  Constitutional: Negative.   HENT: Negative.   Eyes: Positive for visual disturbance.  Respiratory: Negative.   Cardiovascular: Negative.   Gastrointestinal: Negative.   Endocrine: Negative.   Genitourinary: Negative.   Musculoskeletal: Negative.   Neurological: Negative.   Hematological: Negative.     There were no vitals taken for this visit.  Physical Exam Physical Exam  Constitutional: She appears well-developed and well-nourished. No distress.  HENT:  Head: Normocephalic and atraumatic.  Eyes: EOM are normal. No scleral icterus.  Neck: Neck supple.  Cardiovascular: Normal rate and regular rhythm.   Pulmonary/Chest: Effort normal and breath sounds normal.  Breasts are symmetrical in size.  Right breast demonstrates no palpable mass or suspicious skin change.  Left breast demonstrates a small puncture wound in the upright or quadrant area with a 2 cm palpable mass in the upper-outer quadrant area close to the axilla.  Abdominal: Soft. She exhibits no distension and no mass. There is no tenderness.  Musculoskeletal: Normal range of motion. She exhibits no edema.  No palpable axillary or supraclavicular lymph nodes.  Lymphadenopathy:    She has no cervical adenopathy.  Neurological: She is alert.  Skin: Skin is warm and dry.  Psychiatric: She has a normal mood and affect. Her behavior is  normal.    Data Reviewed Mammogram, ultrasound, MRI, pathology report.  Assessment     Invasive left breast cancer with some suspicious lymph nodes in the left axillary area. It is triple negative.    Plan    MRI guided biopsy of suspicious left axillary lymph nodes. Neoadjuvant chemotherapy followed by definitive breast surgery. Insertion of Port-A-Cath prior to neoadjuvant chemotherapy.  The procedure risks and aftercare been explained. Risks include but are not limited to bleeding, infection, malfunction, pneumothorax,  wound problems, DVT.        Ibrahim Mcpheeters J 04/18/2013, 11:02 AM

## 2013-04-19 ENCOUNTER — Other Ambulatory Visit: Payer: BC Managed Care – PPO

## 2013-04-19 ENCOUNTER — Encounter: Payer: Self-pay | Admitting: Specialist

## 2013-04-19 ENCOUNTER — Other Ambulatory Visit: Payer: Self-pay | Admitting: Oncology

## 2013-04-19 DIAGNOSIS — C50919 Malignant neoplasm of unspecified site of unspecified female breast: Secondary | ICD-10-CM

## 2013-04-19 NOTE — Progress Notes (Signed)
Ms. Lindo called needing help in deciding how to tell her elderly mother about her cancer without upsetting her.  She explained that her mother is in the early stages of dementia and lives with her.  I suggested she might want to make an appointment to talk further, and she plans to see me on October 1.  She indicated she has been very private about her diagnosis and that she has not yet told anyone in her family about her cancer.

## 2013-04-20 ENCOUNTER — Other Ambulatory Visit (HOSPITAL_COMMUNITY): Payer: Self-pay | Admitting: *Deleted

## 2013-04-20 ENCOUNTER — Encounter: Payer: Self-pay | Admitting: Oncology

## 2013-04-20 ENCOUNTER — Ambulatory Visit (HOSPITAL_COMMUNITY)
Admission: RE | Admit: 2013-04-20 | Discharge: 2013-04-20 | Disposition: A | Discharge status: Home / Self Care | Payer: BC Managed Care – PPO | Source: Ambulatory Visit | Attending: General Surgery | Admitting: General Surgery

## 2013-04-20 ENCOUNTER — Encounter: Payer: Self-pay | Admitting: *Deleted

## 2013-04-20 ENCOUNTER — Encounter (HOSPITAL_COMMUNITY)
Admission: RE | Admit: 2013-04-20 | Discharge: 2013-04-20 | Disposition: A | Discharge status: Home / Self Care | Payer: BC Managed Care – PPO | Source: Ambulatory Visit | Attending: General Surgery | Admitting: General Surgery

## 2013-04-20 ENCOUNTER — Encounter (HOSPITAL_COMMUNITY): Payer: Self-pay

## 2013-04-20 ENCOUNTER — Telehealth: Payer: Self-pay | Admitting: Oncology

## 2013-04-20 DIAGNOSIS — Z01818 Encounter for other preprocedural examination: Secondary | ICD-10-CM | POA: Insufficient documentation

## 2013-04-20 DIAGNOSIS — M4120 Other idiopathic scoliosis, site unspecified: Secondary | ICD-10-CM | POA: Insufficient documentation

## 2013-04-20 DIAGNOSIS — C50919 Malignant neoplasm of unspecified site of unspecified female breast: Secondary | ICD-10-CM | POA: Insufficient documentation

## 2013-04-20 DIAGNOSIS — Z01812 Encounter for preprocedural laboratory examination: Secondary | ICD-10-CM | POA: Insufficient documentation

## 2013-04-20 LAB — CBC
HCT: 38.2 % (ref 36.0–46.0)
Hemoglobin: 13.3 g/dL (ref 12.0–15.0)
MCHC: 34.8 g/dL (ref 30.0–36.0)
MCV: 87.2 fL (ref 78.0–100.0)
RDW: 12.5 % (ref 11.5–15.5)

## 2013-04-20 NOTE — Telephone Encounter (Signed)
Pt stopped by today to r/s 10/3 lb/GM due to port being placed 10/3. S/w Val and pt r/s to 10/1 GM @ 8:30am and lb @ 8am. Pt aware of new d/t.

## 2013-04-20 NOTE — Progress Notes (Signed)
Put fmla form on nurse's desk °

## 2013-04-20 NOTE — Patient Instructions (Addendum)
20      Your procedure is scheduled on:  Friday 04/27/2013  Report to [facility] at  0700 AM.  Call this number if you have problems the night before or morning of surgery: [phone]   Remember:             IF YOU USE CPAP,BRING MASK AND TUBING AM OF SURGERY!   Do not eat food or drink liquids AFTER MIDNIGHT!  Take these medicines the morning of surgery with A SIP OF WATER: Levothyroxine   Do not bring valuables to the hospital. [facility] IS NOT RESPONSIBLE FOR ANY BELONGINGS OR VALUABLES BROUGHT TO HOSPITAL.  Marland Kitchen  Leave suitcase in the car. After surgery it may be brought to your room.  For patients admitted to the hospital, checkout time is 11:00 AM the day of              Discharge.    DO NOT WEAR JEWELRY , MAKE-UP, LOTIONS,POWDERS,PERFUMES!             WOMEN -DO NOT SHAVE LEGS OR UNDERARMS 12 HRS. BEFORE SURGERY!               MEN MAY SHAVE AS USUAL!             CONTACTS,DENTURES OR BRIDGEWORK, FALSE EYELASHES MAY  NOT BE WORN INTO SURGERY!                                           Patients discharged the day of surgery will not be allowed to drive home.  If going home the same day of surgery, must have someone stay with you first 24 hrs.at home and arrange for someone to drive you home from the Hospital.                          YOUR DRIVER RU:[id number]   Special Instructions:             Please read over the following fact sheets that you were given:             1. [place] PREPARING FOR SURGERY SHEET              2.INCENTIVE SPIROMETRY                                        [place].Ethyle Tiedt,RN,BSN     [phone]                FAILURE TO FOLLOW THESE INSTRUCTIONS MAY RESULT IN CANCELLATION OF YOUR SURGERY!               Patient Signature:___________________________

## 2013-04-20 NOTE — Progress Notes (Signed)
[facility] Psychosocial Distress Screening Clinical Social Work  Clinical Social Work was referred by distress screening protocol.  The patient scored a 8 on the Psychosocial Distress Thermometer which indicates moderate distress. Clinical Social Worker met with Pt to assess for distress and other psychosocial needs. Pt reported some concerns due to family issues and caring for her mother who has dementia. She also has a 48yo and 48yo at home as well. Her main concern was her mother's coping and keeping her cancer hidden from her mother. CSW started to discuss resources to assist in this process. At the end of the appt Pt felt she was a 4 on the distress screen. She plans to consider [facility] resources and will follow up.    Clinical Social Worker follow up needed: no  If yes, follow up plan: Pt the next day called the support team and now has an appt to begin discussing ways to share her illness with family.    Kathrin Penner, MSW, LCSW Clinical Social Worker [facility] [phone]

## 2013-04-23 ENCOUNTER — Encounter (HOSPITAL_COMMUNITY): Payer: Self-pay | Admitting: Pharmacy Technician

## 2013-04-24 ENCOUNTER — Ambulatory Visit
Admission: RE | Admit: 2013-04-24 | Discharge: 2013-04-24 | Disposition: A | Discharge status: Home / Self Care | Payer: BC Managed Care – PPO | Source: Ambulatory Visit | Attending: Oncology | Admitting: Oncology

## 2013-04-24 ENCOUNTER — Other Ambulatory Visit: Payer: Self-pay | Admitting: Oncology

## 2013-04-24 ENCOUNTER — Other Ambulatory Visit: Payer: BC Managed Care – PPO

## 2013-04-24 DIAGNOSIS — C50919 Malignant neoplasm of unspecified site of unspecified female breast: Secondary | ICD-10-CM

## 2013-04-24 HISTORY — PX: BREAST BIOPSY: SHX20

## 2013-04-25 ENCOUNTER — Ambulatory Visit (HOSPITAL_BASED_OUTPATIENT_CLINIC_OR_DEPARTMENT_OTHER): Payer: BC Managed Care – PPO | Admitting: Oncology

## 2013-04-25 ENCOUNTER — Telehealth: Payer: Self-pay | Admitting: *Deleted

## 2013-04-25 ENCOUNTER — Other Ambulatory Visit: Payer: BC Managed Care – PPO | Admitting: Lab

## 2013-04-25 ENCOUNTER — Encounter: Payer: Self-pay | Admitting: Specialist

## 2013-04-25 ENCOUNTER — Encounter: Payer: Self-pay | Admitting: Oncology

## 2013-04-25 VITALS — BP 141/93 | HR 85 | Temp 98.0°F | Resp 20 | Ht 62.0 in | Wt 139.4 lb

## 2013-04-25 DIAGNOSIS — Z171 Estrogen receptor negative status [ER-]: Secondary | ICD-10-CM

## 2013-04-25 DIAGNOSIS — C50412 Malignant neoplasm of upper-outer quadrant of left female breast: Secondary | ICD-10-CM

## 2013-04-25 DIAGNOSIS — C50419 Malignant neoplasm of upper-outer quadrant of unspecified female breast: Secondary | ICD-10-CM

## 2013-04-25 NOTE — Progress Notes (Signed)
IDGunnar Fusi Marian OB: [id number]  MR#: [id number]  WUJ#:[id number]  PCP: Nani Gasser, MD GYN:  Grant Ruts SU: Avel Peace OTHER MD: Hal Neer  CHIEF COMPLAINT: "I have breast cancer"  HISTORY OF PRESENT ILLNESS: Rayyan had routine screening mammography 03/09/2013 showing heterogeneously dense breasts, and a possible asymmetry in the left breast. Diagnostic left mammography and ultrasonography at the breast Center 03/30/2013 found a lobulated mass in the left breast upper outer quadrant measuring 1.6 cm. This was palpable. Ultrasound showed a complex microlobulated hypoechoic mass measuring 1.8 cm. There was no left axillary lymphadenopathy noted.  Biopsy of the mass in question 04/06/2013 showed ([id number]) and invasive ductal carcinoma, grade 3, triple negative, with an MIB-1 of 65%.  Bilateral breast MRI 04/13/2013 found an irregular mass measuring 2.5 cm in the left breast with a few lymph nodes that showed moderate cortical thickness, the largest measuring 12 mm (level I).  The patient's subsequent history is as detailed below  INTERVAL HISTORY: Kaelan returns today accompanied by her mother, for followup of her breast cancer and 2 review results of her ancillary studies. We also discussed the chemotherapy plan and its possible side effects as well as how to manage them.  REVIEW OF SYSTEMS: She feels moderately anxious and this is interfering with her sleep. She's had mild discomfort from her biopsy site. Otherwise a detailed review of systems today was entirely stable. She has already completed her disability papers so that she can concentrate fully on "getting rid of this cancer" over the next few months. She is starting a walking program.  PAST MEDICAL HISTORY: Past Medical History  Diagnosis Date  . Fibroids   . Hypertension   . Hypothyroid   . Breast cancer     PAST SURGICAL HISTORY: Past Surgical History  Procedure Laterality Date  . Tubal ligation       FAMILY HISTORY Family History  Problem Relation Age of Onset  . Hypertension Mother   . Diabetes Mother   . Alzheimer's disease Mother   . Hypertension Father   . Diabetes Father   . Hypertension Brother    the patient's father died in his 88s from complications of diabetes. The patient's mother is living, in her mid 48s. The patient had 2 brothers, and 2 sisters. There is no history of cancer in the family to her knowledge.  GYNECOLOGIC HISTORY:  Menarche age 47, first live birth age 47. The patient is GX P2. She is postmenopausal, but did have some perimenopausal bleeding, which was evaluated age 74 08/14/2012 with a transabdominal and transvaginal ultrasound of the pelvis which found a normal uterine myometrium and left ovary. The right ovary could not be visualized.  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 Her husband [name] also works for the [employer] system. Their children are [name], 8, and [place], 5. The patient attends a [employer]   ADVANCED DIRECTIVES: Not in place   HEALTH MAINTENANCE: History  Substance Use Topics  . Smoking status: Never Smoker   . Smokeless tobacco: Never Used  . Alcohol Use: 1.2 oz/week    2 Glasses of wine per week     Comment: occassion     Colonoscopy:-  PAP: 2013  Bone density:-  Lipid panel:  No Known Allergies  Current Outpatient Prescriptions  Medication Sig Dispense Refill  . acetaminophen (TYLENOL) 500 MG tablet Take 500 mg by mouth every 6 (six) hours as needed for pain.      Marland Kitchen  hydrochlorothiazide (HYDRODIURIL) 25 MG tablet Take 25 mg by mouth daily after lunch.      . levothyroxine (SYNTHROID, LEVOTHROID) 88 MCG tablet Take 88 mcg by mouth daily before breakfast.      . Multiple Vitamins-Minerals (MULTIVITAMIN WITH MINERALS) tablet Take 1 tablet by mouth daily.       No current facility-administered medications for this visit.    OBJECTIVE: Young African  American woman who appears stated age 6 Vitals:   04/25/13 0843  BP: 141/93  Pulse: 85  Temp: 98 F (36.7 C)  Resp: 20     Body mass index is 25.49 kg/(m^2).    ECOG FS:0 - Asymptomatic  Ocular: Sclerae unicteric, pupils equal, round and reactive to light Ear-nose-throat: Oropharynx clear, dentition fair Lymphatic: No cervical or supraclavicular adenopathy Lungs no rales or rhonchi, good excursion bilaterally Heart regular rate and rhythm, no murmur appreciated Abd soft, nontender, positive bowel sounds MSK no focal spinal tenderness, no joint edema Neuro: non-focal, well-oriented, appropriate affect Breasts: The right breast shows no palpable mass, and no skin or nipple change of concern. The right axilla is benign. The left breast is unremarkable.   LAB RESULTS:  CMP     Component Value Date/Time   NA 136 04/11/2013 0927   K 3.9 04/11/2013 0927   CL 103 04/11/2013 0927   CO2 29 04/11/2013 0927   GLUCOSE 83 04/11/2013 0927   BUN 10 04/11/2013 0927   CREATININE 0.67 04/11/2013 0927   CREATININE 0.65 05/21/2009 2243   CALCIUM 9.5 04/11/2013 0927   PROT 7.3 01/19/2013 0910   ALBUMIN 4.2 01/19/2013 0910   AST 21 01/19/2013 0910   ALT 20 01/19/2013 0910   ALKPHOS 70 01/19/2013 0910   BILITOT 0.4 01/19/2013 0910   GFRNONAA >60 09/08/2007 1324   GFRAA  Value: >60        The eGFR has been calculated using the MDRD equation. This calculation has not been validated in all clinical 09/08/2007 1324    I No results found for this basename: SPEP,  UPEP,   kappa and lambda light chains    Lab Results  Component Value Date   WBC 5.5 04/20/2013   HGB 13.3 04/20/2013   HCT 38.2 04/20/2013   MCV 87.2 04/20/2013   PLT 318 04/20/2013      Chemistry      Component Value Date/Time   NA 136 04/11/2013 0927   K 3.9 04/11/2013 0927   CL 103 04/11/2013 0927   CO2 29 04/11/2013 0927   BUN 10 04/11/2013 0927   CREATININE 0.67 04/11/2013 0927   CREATININE 0.65 05/21/2009 2243      Component Value  Date/Time   CALCIUM 9.5 04/11/2013 0927   ALKPHOS 70 01/19/2013 0910   AST 21 01/19/2013 0910   ALT 20 01/19/2013 0910   BILITOT 0.4 01/19/2013 0910       No results found for this basename: LABCA2    No components found with this basename: LABCA125    No results found for this basename: INR,  in the last 168 hours  Urinalysis    Component Value Date/Time   COLORURINE YELLOW 09/17/2007 2215   APPEARANCEUR CLOUDY* 09/17/2007 2215   LABSPEC 1.010 10/02/2007 1101   PHURINE 6.5 10/02/2007 1101   GLUCOSEU NEGATIVE 10/02/2007 1101   HGBUR NEGATIVE 10/02/2007 1101   BILIRUBINUR NEGATIVE 10/02/2007 1101   KETONESUR NEGATIVE 10/02/2007 1101   PROTEINUR NEGATIVE 10/02/2007 1101   UROBILINOGEN 1.0 10/02/2007 1101   NITRITE NEGATIVE  10/02/2007 1101   LEUKOCYTESUR TRACE Biochemical Testing Only. Please order routine urinalysis from main lab if confirmatory testing is needed.* 10/02/2007 1101    STUDIES: Chest 2 View  04/25/2013   ADDENDUM REPORT: 04/25/2013 12:29   Electronically Signed   By: Britta Mccreedy   On: 04/25/2013 12:29   04/25/2013   CLINICAL DATA:  Breast carcinoma. Preop for Port-A-Cath placement.  EXAM: CHEST  2 VIEW  COMPARISON:  None.  FINDINGS: Heart size is normal. Both lungs are clear. No evidence of pleural effusion. No mass or lymphadenopathy identified. Moderate S-shaped thoracolumbar scoliosis is noted.  IMPRESSION: No active cardiopulmonary disease.  Thoracolumbar scoliosis.  Electronically Signed: By: Myles Rosenthal On: 04/20/2013 14:28   US Breast Left  03/30/2013   *RADIOLOGY REPORT*  Clinical Data:  Screening callback for questioned left breast mass  DIGITAL DIAGNOSTIC LEFT MAMMOGRAM WITH CAD AND LEFT BREAST ULTRASOUND:  Comparison:  Prior exams  Findings:  ACR Breast Density Category d:  The breast tissue is extremely dense, which lowers the sensitivity of mammography.  There is a lobulated 1.6 cm mass in the left upper outer quadrant, corresponding to the screening mammographic finding.   Mammographic images were processed with CAD.  On physical exam, I palpate a firm mass in the left breast two o'clock location.  Ultrasound is performed, showing a complex micro lobulated hypoechoic mass left breast two o'clock location 6 cm from the nipple measuring 1.8 x 1.3 x 1.1 cm.  There are anechoic internal areas indicating a cystic component, but there is a suggestion of low-level internal echoes or possible peripheral nodular solid component.  Mild internal vascularity is noted.  No left axillary lymphadenopathy.  IMPRESSION: Suspicious left breast mass 2 o'clock location.  Ultrasound-guided biopsy is recommended and will be scheduled 04/06/2013 according to the reference of the patient.  RECOMMENDATION: Left ultrasound-guided core biopsy  I have discussed the findings and recommendations with the patient. Results were also provided in writing at the conclusion of the visit.  If applicable, a reminder letter will be sent to the patient regarding the next appointment.  BI-RADS CATEGORY 4:  Suspicious abnormality - biopsy should be considered.   Original Report Authenticated By: Christiana Pellant, M.D.   Mr Breast Bilateral W Wo Contrast  04/13/2013   *RADIOLOGY REPORT*  Clinical Data:  new diagnosis of left breast cancer  BILATERAL BREAST MRI WITH AND WITHOUT CONTRAST  Technique: Multiplanar, multisequence MR images of both breasts were obtained prior to and following the intravenous administration of 14ml of Multihance.  THREE-DIMENSIONAL MR IMAGE RENDERING ON INDEPENDENT WORKSTATION: Three-dimensional MR images were rendered by post-processing  of the original MR data on an independent DynaCad workstation. The three-dimensional MR images were interpreted, and findings are reported in the following complete MRI report for this study.  Comparison:  multiple recent imaging examinations including mammography and ultrasound  FINDINGS:  Breast composition:  c:  Heterogeneous fibroglandular tissue  Background  parenchymal enhancement: Marked  Right breast:  No mass or abnormal enhancement.  Left breast:  Associated with a marker clip, there is an irregular mass with irregular borders in the posterior 2:00 position of the left breast.  It measures 25 x 19 x 20mm and demonstrates a mixture of kinetics.  There is enhancement along the biopsy tract with no other suspcious findings.  Lymph nodes:  There are normal right axillary lymph nodes.  In the left axilla, there are no enlarged lymph nodes, and the majority of nodes demonstrate an unremarkable  appearance.  There are a few lymp nodes that demonstrate moderate cortical thickness, the largest measuring 12mm (level I) with another 11mm subpectoral node showing a similar appearance.  Ancillary findings:  None.  IMPRESSION: Biopsy-proven invasive carcinoma left breast.  There are indeterminate left axillary lymph nodes, none of which are enlarged, but some of which demonstrate  some degree of cortical thickening.  BI-RADS CATEGORY 6:  Known biopsy-proven malignancy - appropriate action should be taken.   Original Report Authenticated By: Esperanza Heir, M.D.   Mm Digital Diag Ltd L  03/30/2013   *RADIOLOGY REPORT*  Clinical Data:  Screening callback for questioned left breast mass  DIGITAL DIAGNOSTIC LEFT MAMMOGRAM WITH CAD AND LEFT BREAST ULTRASOUND:  Comparison:  Prior exams  Findings:  ACR Breast Density Category d:  The breast tissue is extremely dense, which lowers the sensitivity of mammography.  There is a lobulated 1.6 cm mass in the left upper outer quadrant, corresponding to the screening mammographic finding.  Mammographic images were processed with CAD.  On physical exam, I palpate a firm mass in the left breast two o'clock location.  Ultrasound is performed, showing a complex micro lobulated hypoechoic mass left breast two o'clock location 6 cm from the nipple measuring 1.8 x 1.3 x 1.1 cm.  There are anechoic internal areas indicating a cystic component, but  there is a suggestion of low-level internal echoes or possible peripheral nodular solid component.  Mild internal vascularity is noted.  No left axillary lymphadenopathy.  IMPRESSION: Suspicious left breast mass 2 o'clock location.  Ultrasound-guided biopsy is recommended and will be scheduled 04/06/2013 according to the reference of the patient.  RECOMMENDATION: Left ultrasound-guided core biopsy  I have discussed the findings and recommendations with the patient. Results were also provided in writing at the conclusion of the visit.  If applicable, a reminder letter will be sent to the patient regarding the next appointment.  BI-RADS CATEGORY 4:  Suspicious abnormality - biopsy should be considered.   Original Report Authenticated By: Christiana Pellant, M.D.   Mm Digital Diagnostic Unilat L  04/09/2013   **ADDENDUM** CREATED: 04/09/2013 13:31:38  *RADIOLOGY REPORT*  ESTABLISHED PATIENT OFFICE VISIT - LEVEL II ([id number])  Chief Complaint:  Left breast ultrasound guided biopsy.  History:  Patient status post left breast ultrasound guided biopsy with pathology demonstrating invasive ductal carcinoma.  Exam:  The patient's left breast biopsy site is clean and intact. Mild surrounding ecchymosis is noted.  The patient is mildly tender.  Pathology: Invasive ductal carcinoma  Assessment and Plan:  Patient with new diagnosis of invasive ductal carcinoma.  The patient is scheduled for multidisciplinary clinic on 04/18/2013 and for a bilateral breast MRI on 04/13/2013.  Addended by:  Jerene Dilling, M.D. on 04/09/2013 13:31:38.  **END ADDENDUM** SIGNED BY: Jerene Dilling, M.D.  04/06/2013   *RADIOLOGY REPORT*  Clinical Data:  Status post ultrasound guided core biopsy left breast mass  DIGITAL DIAGNOSTIC LEFT MAMMOGRAM  Comparison:  Previous exams.  Findings:  Mammographic images were obtained following left breast ultrasound guided biopsy of mass at 2 o'clock position.  CC and lateral views of the left breast  demonstrate biopsy clip in the mass of concern.  IMPRESSION: Post biopsy mammogram demonstrating biopsy clip in the mass of concern.  Final Assessment:  Post Procedure Mammograms for Marker Placement   Original Report Authenticated By: Sherian Rein, M.D.   Mm Radiologist Eval And Mgmt  04/12/2013   *RADIOLOGY REPORT*  ESTABLISHED PATIENT OFFICE VISIT - LEVEL II (  [id number])  Chief Complaint:  Left breast ultrasound guided biopsy.  History:  Patient status post left breast ultrasound guided biopsy with pathology  demonstrating invasive ductal carcinoma.  Exam:  The patient's left breast biopsy site is clean and intact. Mild surrounding  ecchymosis is noted.  The patient is mildly tender.  Pathology: Invasive ductal carcinoma  Assessment and Plan:  Patient with new diagnosis of invasive ductal carcinoma.  The  patient is scheduled for multidisciplinary clinic on 04/18/2013 and for a bilateral  breast MRI on 04/13/2013.   Original Report Authenticated By: Jerene Dilling   [place] Lt Breast Bx W Loc Dev 1st Lesion Img Bx Spec US Guide  04/25/2013   ADDENDUM REPORT: 04/25/2013 12:34  ADDENDUM: Pathology report for left axillary lymph node biopsy shows a benign lymph node. No evidence of malignancy. Pathology results are concordant with imaging findings. I discussed the pathology results with the patient by telephone today. She reports mild soreness at the biopsy site. She has seen Dr. Darnelle Catalan in consultation earlier today and will begin chemotherapy on Monday of next week.   Electronically Signed   By: Britta Mccreedy   On: 04/25/2013 12:34   04/25/2013   CLINICAL DATA:  Patient presents for ultrasound-guided biopsy of a left axillary lymph node. Recent diagnosis of left breast cancer in the upper outer quadrant. On recent breast MRI, there is suggestion of some cortical thickening but overall normal size of axillary lymph nodes bilaterally. In discussion with Dr. Darnelle Catalan today, it is felt that it would be clinically  helpful to biopsy one of the left axillary lymph nodes, despite that they appear within normal limits by ultrasound criteria, when I surveyed the axilla by ultrasound today.  EXAM: ULTRASOUND-GUIDED BIOPSY OF THE LEFT AXILLA.  COMPARISON:  Recent breast MRI  PROCEDURE: I met with the patient and we discussed the procedure of ultrasound-guided biopsy, including benefits and alternatives. We discussed the high likelihood of a successful procedure. We discussed the risks of the procedure including infection, bleeding, tissue injury, and inadequate sampling. Informed written consent was given.  On ultrasound today, left axillary lymph nodes are within normal limits. The have fatty hila, and the cortices measure up to 3 mm greatest diameter, within normal limits. On color Doppler imaging, flow seen within the central hila. No significant cortical flow is seen. Ultrasound of the right axilla is also negative.  Using sterile technique and 2% Lidocaine as local anesthetic, under direct ultrasound visualization, a 14 gauge biopsy device was used to perform biopsy of biopsy of a left axillary lymph node using a inferior to superior approach.  The usual time-out protocol was performed immediately prior to the procedure.  IMPRESSION: Ultrasound-guided biopsy of a left axillary lymph node. No apparent complications.  Electronically Signed: By: Britta Mccreedy On: 04/24/2013 13:03   [place] Lt Breast Bx W Loc Dev 1st Lesion Img Bx Spec US Guide  04/09/2013   **ADDENDUM** CREATED: 04/09/2013 10:04:12  Histologic evaluation demonstrates invasive ductal carcinoma that is likely grade III.  This is concordant with the imaging findings. The patient was initially scheduled to come in to the office to discuss the results but called to discuss the results by telephone as she could not come to the office today.  She was informed of the results of the biopsy.  She states that she has no complications from the procedure.  The patient will be  referred to the [facility] on 04/18/2013.  Breast MRI will be  scheduled at a convenient time for the patient prior to the Clinic appointment.  **END ADDENDUM** SIGNED BY: Cain Saupe, M.D.  04/06/2013   *RADIOLOGY REPORT*  Clinical Data:  Suspicious mass left breast for biopsy  ULTRASOUND GUIDED CORE BIOPSY OF THE left BREAST  Comparison: Previous exams.  I met with the patient and we discussed the procedure of ultrasound- guided biopsy, including benefits and alternatives.  We discussed the high likelihood of a successful procedure. We discussed the risks of the procedure, including infection, bleeding, tissue injury, clip migration, and inadequate sampling.  Informed written consent was given. The usual time-out protocol was performed immediately prior to the procedure.  Using sterile technique and 2% Lidocaine as local anesthetic, under direct ultrasound visualization, a 14 gauge  device was used to perform biopsy of hypoechoic mass at the left breast two o'clock position using a lateral approach.  At the conclusion of the procedure a ribbon tissue marker clip was deployed into the biopsy cavity.  Follow up 2 view mammogram was performed and dictated separately.  IMPRESSION: Ultrasound guided biopsy of left breast.  No apparent complications.  Original Report Authenticated By: Sherian Rein, M.D.    ASSESSMENT: 47 y.o. [place] woman status post left breast upper outer quadrant biopsy 04/06/2013 for a clinical T2 N1, stage IIB invasive ductal carcinoma, high-grade him a triple negative, with an MIB-1 of 65%  PLAN: Tyauna is scheduled for port placement later this week. She had an echocardiogram yesterday, with results pending. 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  Accordingly she is now ready to start chemotherapy, and that will consist of doxorubicin and cyclophosphamide and dose dense fashion x4, at standard doses with Neulasta  support, to be followed by weekly carboplatin and paclitaxel x12. We reviewed again the possible toxicities, side effects and complications and the patient's treatment orders and scripts were written. She received a "map" on how to take her antiestrogen medications, which will consist of dexamethasone, prochlorperazine and lorazepam. A prescription for EMLA cream was also written. The patient has a good understanding on how to take these medications as per her written instructions. First treatment will be October 6. She will return to see [place] October 13 2 troubleshoot any side effects. She knows to call for any problems that may develop before her next visit here.  Lowella Dell, MD   04/25/2013 8:57 AM

## 2013-04-25 NOTE — Progress Notes (Signed)
I met with Brittney Vance at her request after she saw Dr. Darnelle Catalan this morning.  She was still somewhat concerned about how her mother, who has beginning stage dementia according to her, was dealing with Brittney Vance recent diagnosis of breast cancer.  She said that Dr. Darnelle Catalan had helped explain to her mother about the situation.  We chatted in the Cancer Center lobby for about 20 minutes, and I was able to facilitate a brief conversation between mother and daughter, which seemed to indicate her mother was not overly distressed about [name]'s diagnosis.  Brittney Vance expressed the intention to get involved in support group and other support activities to help her along her way during treatment.  I encouraged her to contact me if at any time I can be of assistance.

## 2013-04-25 NOTE — Progress Notes (Signed)
Enrolled pt in the [employer] First Step program.  I faxed signed form and activated card today.  °

## 2013-04-25 NOTE — Telephone Encounter (Signed)
labs@ 9am and ov@ 9:30am. made pt aware that i emailed [facility] asking for a d/t slot for 05/21/13 due to the one he gave is taken. waiting on a response. i also emailed MW adjust tx 10/20.[name]

## 2013-04-26 ENCOUNTER — Other Ambulatory Visit: Payer: Self-pay | Admitting: *Deleted

## 2013-04-26 ENCOUNTER — Encounter: Payer: Self-pay | Admitting: *Deleted

## 2013-04-26 ENCOUNTER — Telehealth: Payer: Self-pay | Admitting: *Deleted

## 2013-04-26 DIAGNOSIS — C50412 Malignant neoplasm of upper-outer quadrant of left female breast: Secondary | ICD-10-CM

## 2013-04-26 MED ORDER — LORAZEPAM 0.5 MG PO TABS: ORAL_TABLET | ORAL | Status: DC

## 2013-04-26 MED ORDER — LIDOCAINE-PRILOCAINE 2.5-2.5 % EX CREA: TOPICAL_CREAM | CUTANEOUS | Status: DC | PRN

## 2013-04-26 MED ORDER — PROCHLORPERAZINE MALEATE 10 MG PO TABS: ORAL_TABLET | ORAL | Status: DC

## 2013-04-26 MED ORDER — DEXAMETHASONE 4 MG PO TABS: ORAL_TABLET | ORAL | Status: DC

## 2013-04-26 NOTE — Telephone Encounter (Signed)
Lm gv appt for 05/21/13 w/ labs@ 2:15pm and ov@ 2:45pm....td

## 2013-04-26 NOTE — Addendum Note (Signed)
Addended by: Lowella Dell on: 04/26/2013 07:34 AM   Modules accepted: Orders

## 2013-04-27 ENCOUNTER — Other Ambulatory Visit: Payer: BC Managed Care – PPO | Admitting: Lab

## 2013-04-27 ENCOUNTER — Encounter (HOSPITAL_COMMUNITY): Payer: Self-pay | Admitting: *Deleted

## 2013-04-27 ENCOUNTER — Other Ambulatory Visit: Payer: Self-pay | Admitting: *Deleted

## 2013-04-27 ENCOUNTER — Ambulatory Visit: Payer: BC Managed Care – PPO | Admitting: Oncology

## 2013-04-27 ENCOUNTER — Encounter (HOSPITAL_COMMUNITY)
Admission: RE | Disposition: A | Discharge status: Home / Self Care | Payer: Self-pay | Source: Ambulatory Visit | Attending: General Surgery

## 2013-04-27 ENCOUNTER — Ambulatory Visit (HOSPITAL_COMMUNITY): Payer: BC Managed Care – PPO | Admitting: Certified Registered Nurse Anesthetist

## 2013-04-27 ENCOUNTER — Telehealth: Payer: Self-pay | Admitting: *Deleted

## 2013-04-27 ENCOUNTER — Ambulatory Visit (HOSPITAL_COMMUNITY): Payer: BC Managed Care – PPO

## 2013-04-27 ENCOUNTER — Encounter (HOSPITAL_COMMUNITY): Payer: Self-pay | Admitting: Certified Registered Nurse Anesthetist

## 2013-04-27 ENCOUNTER — Ambulatory Visit (HOSPITAL_COMMUNITY)
Admission: RE | Admit: 2013-04-27 | Discharge: 2013-04-27 | Disposition: A | Discharge status: Home / Self Care | Payer: BC Managed Care – PPO | Source: Ambulatory Visit | Attending: General Surgery | Admitting: General Surgery

## 2013-04-27 DIAGNOSIS — C50919 Malignant neoplasm of unspecified site of unspecified female breast: Secondary | ICD-10-CM | POA: Insufficient documentation

## 2013-04-27 DIAGNOSIS — Z79899 Other long term (current) drug therapy: Secondary | ICD-10-CM | POA: Insufficient documentation

## 2013-04-27 DIAGNOSIS — I10 Essential (primary) hypertension: Secondary | ICD-10-CM | POA: Insufficient documentation

## 2013-04-27 DIAGNOSIS — E039 Hypothyroidism, unspecified: Secondary | ICD-10-CM | POA: Insufficient documentation

## 2013-04-27 DIAGNOSIS — C50412 Malignant neoplasm of upper-outer quadrant of left female breast: Secondary | ICD-10-CM

## 2013-04-27 HISTORY — PX: PORTACATH PLACEMENT: SHX2246

## 2013-04-27 SURGERY — INSERTION, TUNNELED CENTRAL VENOUS DEVICE, WITH PORT
Anesthesia: General | Site: Chest | Laterality: Right | Wound class: Clean

## 2013-04-27 MED ORDER — MEPERIDINE HCL 50 MG/ML IJ SOLN: 6.2500 mg | INTRAMUSCULAR | Status: DC | PRN

## 2013-04-27 MED ORDER — ONDANSETRON HCL 4 MG/2ML IJ SOLN: 4.0000 mg | Freq: Four times a day (QID) | INTRAMUSCULAR | Status: DC | PRN

## 2013-04-27 MED ORDER — LACTATED RINGERS IV SOLN: INTRAVENOUS | Status: DC

## 2013-04-27 MED ORDER — FENTANYL CITRATE 0.05 MG/ML IJ SOLN
25.0000 ug | INTRAMUSCULAR | Status: DC | PRN
  Administered 2013-04-27: 50 ug via INTRAVENOUS

## 2013-04-27 MED ORDER — LIDOCAINE-PRILOCAINE 2.5-2.5 % EX CREA: TOPICAL_CREAM | CUTANEOUS | Status: DC | PRN

## 2013-04-27 MED ORDER — CEFAZOLIN SODIUM-DEXTROSE 2-3 GM-% IV SOLR
2.0000 g | INTRAVENOUS | Status: AC
  Administered 2013-04-27: 2 g via INTRAVENOUS

## 2013-04-27 MED ORDER — OXYCODONE HCL 5 MG PO TABS
5.0000 mg | ORAL_TABLET | ORAL | Status: DC | PRN
  Administered 2013-04-27: 5 mg via ORAL
  Filled 2013-04-27: qty 1

## 2013-04-27 MED ORDER — FENTANYL CITRATE 0.05 MG/ML IJ SOLN
INTRAMUSCULAR | Status: AC
  Filled 2013-04-27: qty 2

## 2013-04-27 MED ORDER — MORPHINE SULFATE 10 MG/ML IJ SOLN: 2.0000 mg | INTRAMUSCULAR | Status: DC | PRN

## 2013-04-27 MED ORDER — BUPIVACAINE HCL 0.5 % IJ SOLN
INTRAMUSCULAR | Status: AC
  Filled 2013-04-27: qty 1

## 2013-04-27 MED ORDER — ACETAMINOPHEN 325 MG PO TABS: 650.0000 mg | ORAL_TABLET | ORAL | Status: DC | PRN

## 2013-04-27 MED ORDER — LACTATED RINGERS IV SOLN
INTRAVENOUS | Status: DC
  Administered 2013-04-27: 09:00:00 via INTRAVENOUS
  Administered 2013-04-27: 1000 mL via INTRAVENOUS

## 2013-04-27 MED ORDER — MIDAZOLAM HCL 5 MG/5ML IJ SOLN
INTRAMUSCULAR | Status: DC | PRN
  Administered 2013-04-27: 2 mg via INTRAVENOUS

## 2013-04-27 MED ORDER — FENTANYL CITRATE 0.05 MG/ML IJ SOLN
INTRAMUSCULAR | Status: DC | PRN
  Administered 2013-04-27 (×3): 25 ug via INTRAVENOUS

## 2013-04-27 MED ORDER — BUPIVACAINE-EPINEPHRINE PF 0.5-1:200000 % IJ SOLN
INTRAMUSCULAR | Status: AC
  Filled 2013-04-27: qty 30

## 2013-04-27 MED ORDER — DEXAMETHASONE SODIUM PHOSPHATE 10 MG/ML IJ SOLN
INTRAMUSCULAR | Status: DC | PRN
  Administered 2013-04-27: 10 mg via INTRAVENOUS

## 2013-04-27 MED ORDER — LIDOCAINE HCL (CARDIAC) 20 MG/ML IV SOLN
INTRAVENOUS | Status: DC | PRN
  Administered 2013-04-27: 50 mg via INTRAVENOUS

## 2013-04-27 MED ORDER — HYDROCODONE-ACETAMINOPHEN 5-325 MG PO TABS: 1.0000 | ORAL_TABLET | Freq: Four times a day (QID) | ORAL | Status: DC | PRN

## 2013-04-27 MED ORDER — SODIUM CHLORIDE 0.9 % IR SOLN
Status: AC
  Administered 2013-04-27: 10:00:00
  Filled 2013-04-27: qty 1.2

## 2013-04-27 MED ORDER — HEPARIN SOD (PORK) LOCK FLUSH 100 UNIT/ML IV SOLN
INTRAVENOUS | Status: DC | PRN
  Administered 2013-04-27: 250 [IU] via INTRAVENOUS

## 2013-04-27 MED ORDER — BUPIVACAINE HCL 0.5 % IJ SOLN
INTRAMUSCULAR | Status: DC | PRN
  Administered 2013-04-27: 14 mL

## 2013-04-27 MED ORDER — PROMETHAZINE HCL 25 MG/ML IJ SOLN: 6.2500 mg | INTRAMUSCULAR | Status: DC | PRN

## 2013-04-27 MED ORDER — PROPOFOL 10 MG/ML IV BOLUS
INTRAVENOUS | Status: DC | PRN
  Administered 2013-04-27: 200 mg via INTRAVENOUS

## 2013-04-27 MED ORDER — LIDOCAINE-EPINEPHRINE 1 %-1:100000 IJ SOLN
INTRAMUSCULAR | Status: AC
  Filled 2013-04-27: qty 1

## 2013-04-27 MED ORDER — ACETAMINOPHEN 650 MG RE SUPP
650.0000 mg | RECTAL | Status: DC | PRN
  Filled 2013-04-27: qty 1

## 2013-04-27 MED ORDER — HEPARIN SOD (PORK) LOCK FLUSH 100 UNIT/ML IV SOLN
INTRAVENOUS | Status: AC
  Filled 2013-04-27: qty 5

## 2013-04-27 MED ORDER — ONDANSETRON HCL 4 MG/2ML IJ SOLN
INTRAMUSCULAR | Status: DC | PRN
  Administered 2013-04-27: 4 mg via INTRAVENOUS

## 2013-04-27 MED ORDER — CEFAZOLIN SODIUM-DEXTROSE 2-3 GM-% IV SOLR
INTRAVENOUS | Status: AC
  Filled 2013-04-27: qty 50

## 2013-04-27 MED ORDER — SODIUM CHLORIDE 0.9 % IJ SOLN: 3.0000 mL | INTRAMUSCULAR | Status: DC | PRN

## 2013-04-27 SURGICAL SUPPLY — 40 items
APL SKNCLS STERI-STRIP NONHPOA (GAUZE/BANDAGES/DRESSINGS) ×1
BAG DECANTER FOR FLEXI CONT (MISCELLANEOUS) ×2 IMPLANT
BENZOIN TINCTURE PRP APPL 2/3 (GAUZE/BANDAGES/DRESSINGS) ×2 IMPLANT
BLADE HEX COATED 2.75 (ELECTRODE) ×2 IMPLANT
BLADE SURG 15 STRL LF DISP TIS (BLADE) ×1 IMPLANT
BLADE SURG 15 STRL SS (BLADE) ×1
BLADE SURG SZ11 CARB STEEL (BLADE) ×2 IMPLANT
CHLORAPREP W/TINT 26ML (MISCELLANEOUS) ×2 IMPLANT
CLOSURE STERI-STRIP 1/4X4 (GAUZE/BANDAGES/DRESSINGS) ×2 IMPLANT
DECANTER SPIKE VIAL GLASS SM (MISCELLANEOUS) ×2 IMPLANT
DRAPE C-ARM 42X120 X-RAY (DRAPES) ×2 IMPLANT
DRAPE LAPAROTOMY TRNSV 102X78 (DRAPE) ×2 IMPLANT
DRSG TEGADERM 2-3/8X2-3/4 SM (GAUZE/BANDAGES/DRESSINGS) ×2 IMPLANT
DRSG TEGADERM 4X4.75 (GAUZE/BANDAGES/DRESSINGS) ×2 IMPLANT
ELECT REM PT RETURN 9FT ADLT (ELECTROSURGICAL) ×2
ELECTRODE REM PT RTRN 9FT ADLT (ELECTROSURGICAL) ×1 IMPLANT
GAUZE SPONGE 2X2 8PLY STRL LF (GAUZE/BANDAGES/DRESSINGS) ×1 IMPLANT
GAUZE SPONGE 4X4 16PLY XRAY LF (GAUZE/BANDAGES/DRESSINGS) ×2 IMPLANT
GLOVE ECLIPSE 8.0 STRL XLNG CF (GLOVE) ×2 IMPLANT
GLOVE INDICATOR 8.0 STRL GRN (GLOVE) ×2 IMPLANT
GOWN STRL REIN XL XLG (GOWN DISPOSABLE) ×4 IMPLANT
KIT BASIN OR (CUSTOM PROCEDURE TRAY) ×2 IMPLANT
KIT PORT POWER 8FR ISP CVUE (Catheter) ×4 IMPLANT
NEEDLE HYPO 25X1 1.5 SAFETY (NEEDLE) ×2 IMPLANT
NS IRRIG 1000ML POUR BTL (IV SOLUTION) ×2 IMPLANT
PACK BASIC VI WITH GOWN DISP (CUSTOM PROCEDURE TRAY) ×2 IMPLANT
PENCIL BUTTON HOLSTER BLD 10FT (ELECTRODE) ×2 IMPLANT
SPONGE GAUZE 2X2 STER 10/PKG (GAUZE/BANDAGES/DRESSINGS) ×1
SPONGE GAUZE 4X4 12PLY (GAUZE/BANDAGES/DRESSINGS) ×2 IMPLANT
STRIP CLOSURE SKIN 1/2X4 (GAUZE/BANDAGES/DRESSINGS) ×2 IMPLANT
SUT MNCRL AB 4-0 PS2 18 (SUTURE) ×2 IMPLANT
SUT VIC AB 2-0 SH 18 (SUTURE) ×2 IMPLANT
SUT VIC AB 2-0 SH 27 (SUTURE) ×2
SUT VIC AB 2-0 SH 27X BRD (SUTURE) ×1 IMPLANT
SUT VIC AB 3-0 SH 27 (SUTURE) ×1
SUT VIC AB 3-0 SH 27XBRD (SUTURE) ×1 IMPLANT
SYR 20CC LL (SYRINGE) ×2 IMPLANT
SYRINGE 10CC LL (SYRINGE) ×2 IMPLANT
TOWEL OR 17X26 10 PK STRL BLUE (TOWEL DISPOSABLE) ×2 IMPLANT
TOWEL OR NON WOVEN STRL DISP B (DISPOSABLE) ×2 IMPLANT

## 2013-04-27 NOTE — Telephone Encounter (Signed)
sw [facility] office and requested that they cancel the pt echo due to the pt needs it before her first tx...Brittney KitchenMarland Kitchentd

## 2013-04-27 NOTE — Anesthesia Postprocedure Evaluation (Signed)
  Anesthesia Post-op Note  Patient: Brittney Vance  Procedure(s) Performed: Procedure(s) (LRB): ULTRASOUND GUIDED PORT INSERTION WITH FLUOROSCOPY (Right)  Patient Location: PACU  Anesthesia Type: General  Level of Consciousness: awake and alert   Airway and Oxygen Therapy: Patient Spontanous Breathing  Post-op Pain: mild  Post-op Assessment: Post-op Vital signs reviewed, Patient's Cardiovascular Status Stable, Respiratory Function Stable, Patent Airway and No signs of Nausea or vomiting  Last Vitals:  Filed Vitals:   04/27/13 1158  BP: 148/97  Pulse: 84  Temp: 36.6 C  Resp: 16    Post-op Vital Signs: stable   Complications: No apparent anesthesia complications

## 2013-04-27 NOTE — Op Note (Signed)
Preoperative diagnosis:  Breast cancer  Postoperative diagnosis:  Same  Procedure: Ultrasound-guided Port-A-Cath insertion into right internal jugular vein under fluoroscopy.  Surgeon: Avel Peace M.D.  Anesthesia: General/LMA withLocal (Marcaine).  Indication:  This is a 47 year old female with newly diagnosed breast cancer.  She needs long-term venous access for neoadjuvant chemotherapy.  Technique: She was brought to the operating room, placed supine on the operating table, and intravenous sedation was given. A roll was placed under the back and the arms were tucked. The upper chest wall and neck were sterilely prepped and draped.  Her head was rotated to the left. Using the ultrasound, the right internal jugular vein was identified. Local anesthetic was infiltrated in the skin and subcutaneous tissue just anterior to it. A 16-gauge needle was used to cannulate the right internal jugular vein under ultrasound guidance. A wire was then threaded through the needle into the internal jugular vein and down into the right heart under ultrasound and fluoroscopic guidance.  Local anesthetic was infiltrated into the right upper chest wall. A right upper chest wall incision was made and a pocket was created for the Portacath.  An incision was made around the wire in the neck. The catheter was then tunneled from the chest wall incision up through the neck incision.  A dilator- introducer complex was placed over the wire into the superior vena cava. The dilator and wire were then removed and the catheter was threaded through the peel-away sheath introducer into the right heart. The introducer was then peeled away and removed. Under fluoroscopic guidance, the tip of the catheter was then pulled back until it was at the junction of the superior vena cava and right atrium. The catheter was then connected to the port.  The port aspirated blood and flushed easily.  The port was then anchored to the chest  wall with 2-0 Vicryl suture. Concentrated heparin solution was then placed into the port. The port and catheter position were then verified using fluoroscopy. The subcutaneous tissue was then closed over the port with running 3-0 Vicryl suture. The skin incisions were then closed with 4-0 Monocryl subcuticular stitches. Steri-Strips and sterile dressings were applied.  The procedure was well-tolerated without any apparent complications. The patient was taken to the recovery room in satisfactory condition where a portable chest x-ray is pending.

## 2013-04-27 NOTE — Interval H&P Note (Signed)
History and Physical Interval Note:  04/27/2013 9:04 AM  Brittney Vance  has presented today for surgery, with the diagnosis of left breast cancer  The various methods of treatment have been discussed with the patient and family. After consideration of risks, benefits and other options for treatment, the patient has consented to  Procedure(s): ULTRASOUND GUIDED PORT INSERTION WITH FLUOROSCOPY (N/A) as a surgical intervention .  The patient's history has been reviewed, patient examined, no change in status, stable for surgery.  I have reviewed the patient's chart and labs.  Questions were answered to the patient's satisfaction.     Myrna Vonseggern Shela Commons

## 2013-04-27 NOTE — H&P (View-Only) (Signed)
Patient ID: Brittney Vance, female   DOB: Jul 09, 1966, 47 y.o.   MRN: [id number]  No chief complaint on file.   HPI Brittney Vance is a 47 y.o. female.   HPI  She is referred by Dr. Penne Lash for further evaluation and treatment of newly diagnosed invasive left breast cancer the is triple negative with a proliferation rate of 65%.  She was noted to have left breast asymmetry and a mammogram  demonstrated a 1.6 cm lobulated mass anteriorly in the left upper outer quadrant. This was palpable at the 2:00 position. Ultrasound demonstrated the lesion to be 1.8 cm in size. Image guided biopsy was performed with results as above. MRI shows the lesion to be 2.5 cm. There are indeterminate left axillary lymph nodes which are borderline enlarged.  No family history of breast cancer. Age at menarche was 53. Age at first live birth was 49. She is not having periods. No breast complaints.  Past Medical History  Diagnosis Date  . Fibroids   . Hypertension   . Hypothyroid   . Breast cancer     Past Surgical History  Procedure Laterality Date  . Tubal ligation      Family History  Problem Relation Age of Onset  . Hypertension Mother   . Diabetes Mother   . Alzheimer's disease Mother   . Hypertension Father   . Diabetes Father   . Hypertension Brother     Social History History  Substance Use Topics  . Smoking status: Never Smoker   . Smokeless tobacco: Never Used  . Alcohol Use: 1.2 oz/week    2 Glasses of wine per week     Comment: occassion    No Known Allergies  Current Outpatient Prescriptions  Medication Sig Dispense Refill  . hydrochlorothiazide (HYDRODIURIL) 25 MG tablet Take 1 tablet (25 mg total) by mouth daily.  30 tablet  2  . levothyroxine (SYNTHROID, LEVOTHROID) 88 MCG tablet Take 1 tablet (88 mcg total) by mouth daily.  31 tablet  12  . Multiple Vitamins-Minerals (MULTIVITAMIN WITH MINERALS) tablet Take 1 tablet by mouth daily.       No current facility-administered medications  for this visit.    Review of Systems Review of Systems  Constitutional: Negative.   HENT: Negative.   Eyes: Positive for visual disturbance.  Respiratory: Negative.   Cardiovascular: Negative.   Gastrointestinal: Negative.   Endocrine: Negative.   Genitourinary: Negative.   Musculoskeletal: Negative.   Neurological: Negative.   Hematological: Negative.     There were no vitals taken for this visit.  Physical Exam Physical Exam  Constitutional: She appears well-developed and well-nourished. No distress.  HENT:  Head: Normocephalic and atraumatic.  Eyes: EOM are normal. No scleral icterus.  Neck: Neck supple.  Cardiovascular: Normal rate and regular rhythm.   Pulmonary/Chest: Effort normal and breath sounds normal.  Breasts are symmetrical in size.  Right breast demonstrates no palpable mass or suspicious skin change.  Left breast demonstrates a small puncture wound in the upright or quadrant area with a 2 cm palpable mass in the upper-outer quadrant area close to the axilla.  Abdominal: Soft. She exhibits no distension and no mass. There is no tenderness.  Musculoskeletal: Normal range of motion. She exhibits no edema.  No palpable axillary or supraclavicular lymph nodes.  Lymphadenopathy:    She has no cervical adenopathy.  Neurological: She is alert.  Skin: Skin is warm and dry.  Psychiatric: She has a normal mood and affect. Her behavior is  normal.    Data Reviewed Mammogram, ultrasound, MRI, pathology report.  Assessment     Invasive left breast cancer with some suspicious lymph nodes in the left axillary area. It is triple negative.    Plan    MRI guided biopsy of suspicious left axillary lymph nodes. Neoadjuvant chemotherapy followed by definitive breast surgery. Insertion of Port-A-Cath prior to neoadjuvant chemotherapy.  The procedure risks and aftercare been explained. Risks include but are not limited to bleeding, infection, malfunction, pneumothorax,  wound problems, DVT.        Jaiven Graveline J 04/18/2013, 11:02 AM

## 2013-04-27 NOTE — Anesthesia Preprocedure Evaluation (Addendum)
Anesthesia Evaluation  Patient identified by MRN, date of birth, ID band Patient awake    Reviewed: Allergy & Precautions, H&P , NPO status , Patient's Chart, lab work & pertinent test results  Airway Mallampati: II TM Distance: >3 FB Neck ROM: Full    Dental no notable dental hx.    Pulmonary neg pulmonary ROS,  breath sounds clear to auscultation  Pulmonary exam normal       Cardiovascular hypertension, Pt. on medications negative cardio ROS  Rhythm:Regular Rate:Normal     Neuro/Psych negative neurological ROS  negative psych ROS   GI/Hepatic negative GI ROS, Neg liver ROS,   Endo/Other  negative endocrine ROSHypothyroidism   Renal/GU negative Renal ROS  negative genitourinary   Musculoskeletal negative musculoskeletal ROS (+)   Abdominal   Peds negative pediatric ROS (+)  Hematology negative hematology ROS (+)   Anesthesia Other Findings   Reproductive/Obstetrics negative OB ROS                         Anesthesia Physical Anesthesia Plan  ASA: II  Anesthesia Plan: General   Post-op Pain Management:    Induction: Intravenous  Airway Management Planned: LMA  Additional Equipment:   Intra-op Plan:   Post-operative Plan: Extubation in OR  Informed Consent: I have reviewed the patients History and Physical, chart, labs and discussed the procedure including the risks, benefits and alternatives for the proposed anesthesia with the patient or authorized representative who has indicated his/her understanding and acceptance.   Dental advisory given  Plan Discussed with: CRNA  Anesthesia Plan Comments:         Anesthesia Quick Evaluation

## 2013-04-27 NOTE — Telephone Encounter (Signed)
lm gv pt appt for echo on 10.6.14 @ 9am....td

## 2013-04-27 NOTE — Transfer of Care (Signed)
Immediate Anesthesia Transfer of Care Note  Patient: Brittney Vance  Procedure(s) Performed: Procedure(s): ULTRASOUND GUIDED PORT INSERTION WITH FLUOROSCOPY (Right)  Patient Location: PACU  Anesthesia Type:General  Level of Consciousness: awake and alert   Airway & Oxygen Therapy: Patient Spontanous Breathing and Patient connected to face mask oxygen  Post-op Assessment: Report given to PACU RN and Post -op Vital signs reviewed and stable  Post vital signs: Reviewed and stable  Complications: No apparent anesthesia complications

## 2013-04-30 ENCOUNTER — Ambulatory Visit (HOSPITAL_BASED_OUTPATIENT_CLINIC_OR_DEPARTMENT_OTHER): Payer: BC Managed Care – PPO

## 2013-04-30 ENCOUNTER — Other Ambulatory Visit (HOSPITAL_COMMUNITY): Payer: BC Managed Care – PPO

## 2013-04-30 ENCOUNTER — Encounter: Payer: Self-pay | Admitting: Specialist

## 2013-04-30 ENCOUNTER — Ambulatory Visit (HOSPITAL_COMMUNITY)
Admit: 2013-04-30 | Discharge: 2013-04-30 | Disposition: A | Discharge status: Home / Self Care | Payer: BC Managed Care – PPO | Attending: Oncology | Admitting: Oncology

## 2013-04-30 ENCOUNTER — Other Ambulatory Visit: Payer: BC Managed Care – PPO | Admitting: Lab

## 2013-04-30 ENCOUNTER — Encounter (HOSPITAL_COMMUNITY): Payer: Self-pay | Admitting: General Surgery

## 2013-04-30 ENCOUNTER — Other Ambulatory Visit (HOSPITAL_BASED_OUTPATIENT_CLINIC_OR_DEPARTMENT_OTHER): Payer: BC Managed Care – PPO | Admitting: Lab

## 2013-04-30 VITALS — BP 102/62 | HR 78 | Temp 98.0°F | Resp 16

## 2013-04-30 DIAGNOSIS — Z5111 Encounter for antineoplastic chemotherapy: Secondary | ICD-10-CM

## 2013-04-30 DIAGNOSIS — Z01818 Encounter for other preprocedural examination: Secondary | ICD-10-CM | POA: Insufficient documentation

## 2013-04-30 DIAGNOSIS — C50419 Malignant neoplasm of upper-outer quadrant of unspecified female breast: Secondary | ICD-10-CM

## 2013-04-30 DIAGNOSIS — I10 Essential (primary) hypertension: Secondary | ICD-10-CM | POA: Insufficient documentation

## 2013-04-30 DIAGNOSIS — Z09 Encounter for follow-up examination after completed treatment for conditions other than malignant neoplasm: Secondary | ICD-10-CM

## 2013-04-30 DIAGNOSIS — I517 Cardiomegaly: Secondary | ICD-10-CM | POA: Insufficient documentation

## 2013-04-30 DIAGNOSIS — C50412 Malignant neoplasm of upper-outer quadrant of left female breast: Secondary | ICD-10-CM

## 2013-04-30 DIAGNOSIS — C50919 Malignant neoplasm of unspecified site of unspecified female breast: Secondary | ICD-10-CM | POA: Insufficient documentation

## 2013-04-30 LAB — CBC WITH DIFFERENTIAL/PLATELET
Basophils Absolute: 0.1 10*3/uL (ref 0.0–0.1)
EOS%: 1.6 % (ref 0.0–7.0)
HCT: 36.9 % (ref 34.8–46.6)
HGB: 12.7 g/dL (ref 11.6–15.9)
LYMPH%: 47.2 % (ref 14.0–49.7)
MCH: 30.6 pg (ref 25.1–34.0)
MCHC: 34.5 g/dL (ref 31.5–36.0)
MCV: 88.8 fL (ref 79.5–101.0)
MONO#: 0.5 10*3/uL (ref 0.1–0.9)
MONO%: 8.8 % (ref 0.0–14.0)
NEUT%: 41.5 % (ref 38.4–76.8)
Platelets: 270 10*3/uL (ref 145–400)
RBC: 4.16 10*6/uL (ref 3.70–5.45)
RDW: 12.6 % (ref 11.2–14.5)

## 2013-04-30 LAB — COMPREHENSIVE METABOLIC PANEL (CC13)
AST: 17 U/L (ref 5–34)
Alkaline Phosphatase: 67 U/L (ref 40–150)
BUN: 10.2 mg/dL (ref 7.0–26.0)
CO2: 28 mEq/L (ref 22–29)
Calcium: 9.7 mg/dL (ref 8.4–10.4)
Chloride: 104 mEq/L (ref 98–109)
Creatinine: 0.7 mg/dL (ref 0.6–1.1)
Total Bilirubin: 0.28 mg/dL (ref 0.20–1.20)
Total Protein: 7.9 g/dL (ref 6.4–8.3)

## 2013-04-30 MED ORDER — DOXORUBICIN HCL CHEMO IV INJECTION 2 MG/ML
60.0000 mg/m2 | Freq: Once | INTRAVENOUS | Status: AC
  Administered 2013-04-30: 100 mg via INTRAVENOUS
  Filled 2013-04-30: qty 50

## 2013-04-30 MED ORDER — LORAZEPAM 2 MG/ML IJ SOLN
INTRAMUSCULAR | Status: AC
  Filled 2013-04-30: qty 1

## 2013-04-30 MED ORDER — LORAZEPAM 2 MG/ML IJ SOLN
0.5000 mg | Freq: Once | INTRAMUSCULAR | Status: AC
  Administered 2013-04-30: 0.5 mg via INTRAVENOUS

## 2013-04-30 MED ORDER — DEXAMETHASONE SODIUM PHOSPHATE 20 MG/5ML IJ SOLN
INTRAMUSCULAR | Status: AC
  Filled 2013-04-30: qty 5

## 2013-04-30 MED ORDER — SODIUM CHLORIDE 0.9 % IV SOLN
600.0000 mg/m2 | Freq: Once | INTRAVENOUS | Status: AC
  Administered 2013-04-30: 1000 mg via INTRAVENOUS
  Filled 2013-04-30: qty 50

## 2013-04-30 MED ORDER — HEPARIN SOD (PORK) LOCK FLUSH 100 UNIT/ML IV SOLN
500.0000 [IU] | Freq: Once | INTRAVENOUS | Status: AC | PRN
  Administered 2013-04-30: 500 [IU]
  Filled 2013-04-30: qty 5

## 2013-04-30 MED ORDER — PALONOSETRON HCL INJECTION 0.25 MG/5ML
0.2500 mg | Freq: Once | INTRAVENOUS | Status: AC
  Administered 2013-04-30: 0.25 mg via INTRAVENOUS

## 2013-04-30 MED ORDER — SODIUM CHLORIDE 0.9 % IV SOLN
Freq: Once | INTRAVENOUS | Status: AC
  Administered 2013-04-30: 11:00:00 via INTRAVENOUS

## 2013-04-30 MED ORDER — SODIUM CHLORIDE 0.9 % IJ SOLN
10.0000 mL | INTRAMUSCULAR | Status: DC | PRN
  Administered 2013-04-30: 10 mL
  Filled 2013-04-30: qty 10

## 2013-04-30 MED ORDER — DEXAMETHASONE SODIUM PHOSPHATE 20 MG/5ML IJ SOLN
12.0000 mg | Freq: Once | INTRAMUSCULAR | Status: AC
  Administered 2013-04-30: 12 mg via INTRAVENOUS

## 2013-04-30 MED ORDER — SODIUM CHLORIDE 0.9 % IV SOLN
150.0000 mg | Freq: Once | INTRAVENOUS | Status: AC
  Administered 2013-04-30: 150 mg via INTRAVENOUS
  Filled 2013-04-30: qty 5

## 2013-04-30 MED ORDER — PALONOSETRON HCL INJECTION 0.25 MG/5ML
INTRAVENOUS | Status: AC
  Filled 2013-04-30: qty 5

## 2013-04-30 NOTE — Progress Notes (Signed)
1210-Pt very anxious and has been crying off and on since she entered the lab earlier today.  Dr. Darnelle Catalan notified and order received to give Ativan 0.5 mg IV.  1235-Pt resting quietly and has no complaints at this time.  1240-Positive blood return pre, during and after Adriamycin injection.

## 2013-04-30 NOTE — Patient Instructions (Addendum)
[facility] Discharge Instructions for Patients Receiving Chemotherapy  Today you received the following chemotherapy agents:  Adriamycin and Cytoxan  To help prevent nausea and vomiting after your treatment, we encourage you to take your nausea medication as ordered per MD.   If you develop nausea and vomiting that is not controlled by your nausea medication, call the clinic.   BELOW ARE SYMPTOMS THAT SHOULD BE REPORTED IMMEDIATELY:  *FEVER GREATER THAN 100.5 F  *CHILLS WITH OR WITHOUT FEVER  NAUSEA AND VOMITING THAT IS NOT CONTROLLED WITH YOUR NAUSEA MEDICATION  *UNUSUAL SHORTNESS OF BREATH  *UNUSUAL BRUISING OR BLEEDING  TENDERNESS IN MOUTH AND THROAT WITH OR WITHOUT PRESENCE OF ULCERS  *URINARY PROBLEMS  *BOWEL PROBLEMS  UNUSUAL RASH Items with * indicate a potential emergency and should be followed up as soon as possible.  Feel free to call the clinic you have any questions or concerns. The clinic phone number is [phone].    

## 2013-04-30 NOTE — Progress Notes (Signed)
Echocardiogram 2D Echocardiogram has been performed.  Brittney Vance 04/30/2013, 9:55 AM

## 2013-04-30 NOTE — Progress Notes (Signed)
I visited with Brittney Vance today in the infusion room during her first chemotherapy treatment.  My pastoral care intern, Brittney Vance, had seen her earlier and suggested I stop by.  Brittney Vance had come to her treatment by herself and in a cab.  She said her husband was picking her up this afternoon.  She expressed feeling overwhelmed by the diagnosis and the speed with which the tests and treatment have taken place.  I listened to her feelings and assured her that treatment most often settles into a routine.  I strongly encouraged her to attend Breast Cancer Support Group and earlier made a referral for her to [facility], although she has not yet been contacted.  Brittney Vance works as a Comptroller at an [employer] and has two children, ages 36 and 8.  She also takes care of her elderly mother, who has dementia.  She is married.  I will continue to check on and support her.

## 2013-05-01 ENCOUNTER — Ambulatory Visit (HOSPITAL_BASED_OUTPATIENT_CLINIC_OR_DEPARTMENT_OTHER): Payer: BC Managed Care – PPO

## 2013-05-01 ENCOUNTER — Other Ambulatory Visit (INDEPENDENT_AMBULATORY_CARE_PROVIDER_SITE_OTHER): Payer: Self-pay | Admitting: General Surgery

## 2013-05-01 ENCOUNTER — Telehealth: Payer: Self-pay | Admitting: *Deleted

## 2013-05-01 VITALS — BP 152/91 | HR 103 | Temp 97.9°F

## 2013-05-01 DIAGNOSIS — C50412 Malignant neoplasm of upper-outer quadrant of left female breast: Secondary | ICD-10-CM

## 2013-05-01 DIAGNOSIS — Z5189 Encounter for other specified aftercare: Secondary | ICD-10-CM

## 2013-05-01 DIAGNOSIS — C50419 Malignant neoplasm of upper-outer quadrant of unspecified female breast: Secondary | ICD-10-CM

## 2013-05-01 MED ORDER — PEGFILGRASTIM INJECTION 6 MG/0.6ML
6.0000 mg | Freq: Once | SUBCUTANEOUS | Status: AC
  Administered 2013-05-01: 6 mg via SUBCUTANEOUS
  Filled 2013-05-01: qty 0.6

## 2013-05-01 NOTE — Patient Instructions (Addendum)

## 2013-05-01 NOTE — Telephone Encounter (Signed)
Brittney Vance here for Neulasta injection following 1st ac chemotherapy. She is somewhat nervous.  States she is not having any nausea, vomiting, or diarrhea.  Is eating and drinking well.  All questions answered.  Knows to call if she has any problems or concerns.

## 2013-05-02 ENCOUNTER — Other Ambulatory Visit: Payer: BC Managed Care – PPO

## 2013-05-02 ENCOUNTER — Telehealth (INDEPENDENT_AMBULATORY_CARE_PROVIDER_SITE_OTHER): Payer: Self-pay | Admitting: General Surgery

## 2013-05-02 ENCOUNTER — Encounter (INDEPENDENT_AMBULATORY_CARE_PROVIDER_SITE_OTHER): Payer: Self-pay | Admitting: General Surgery

## 2013-05-02 NOTE — Telephone Encounter (Signed)
Patient to let her know that she had a written Rx for  Norco 5/325 1-2 po q 4 hrs prn for pain #30. With on refills. Rx will be at the front desk

## 2013-05-03 ENCOUNTER — Encounter: Payer: Self-pay | Admitting: *Deleted

## 2013-05-03 NOTE — Progress Notes (Signed)
[facility] Clinical Social Work  Clinical Social Work was referred by patient for assessment of psychosocial needs.  Clinical Social Worker returned Pt's call at home to offer support and assess for needs.  Pt requesting help with resources, but not able to share a specific need or concern currently. CSW  described our resources here and Pt plans to attend Breast Cancer Support Group. She denies concerns with her children currently. CSW assessed for current concerns and Pt denied any currently. CSW explained if there was a specific need we could then find a specific resource to assist, but there are certain qualifications for specific resources. CSW further explained the CSW role and Pt agrees to follow up when there is a need or a concern. CSW will assist Pt with a wig voucher and she plans to pick that up at her next appt next week. She agrees to contact CSW as needs arise.     Doreen Salvage, LCSW Clinical Social Worker [facility] Wednesday, Thursday and Friday Phone: [phone] Fax: [phone]

## 2013-05-03 NOTE — Progress Notes (Signed)
Late entry from 04/30/13.   Seen pt in chemo room during initial chemotherapy treatment.  Pt received dose of ativan for extreme anxiety.  Pt was calm at time of our discussion.  I discussed neulasta and how medicaton worked in the body.  Discussed s/s and medications pt can take to assist with those s/e.  Received verbal understanding.  Pt denies verbal understanding.  Melodie Bouillon put instructions on d/c sheet.

## 2013-05-04 ENCOUNTER — Other Ambulatory Visit: Payer: Self-pay | Admitting: *Deleted

## 2013-05-04 DIAGNOSIS — C50412 Malignant neoplasm of upper-outer quadrant of left female breast: Secondary | ICD-10-CM

## 2013-05-07 ENCOUNTER — Other Ambulatory Visit (HOSPITAL_BASED_OUTPATIENT_CLINIC_OR_DEPARTMENT_OTHER): Payer: BC Managed Care – PPO | Admitting: Lab

## 2013-05-07 ENCOUNTER — Other Ambulatory Visit: Payer: Self-pay | Admitting: *Deleted

## 2013-05-07 ENCOUNTER — Telehealth: Payer: Self-pay | Admitting: *Deleted

## 2013-05-07 ENCOUNTER — Ambulatory Visit (HOSPITAL_BASED_OUTPATIENT_CLINIC_OR_DEPARTMENT_OTHER): Payer: BC Managed Care – PPO | Admitting: Physician Assistant

## 2013-05-07 ENCOUNTER — Encounter: Payer: Self-pay | Admitting: Physician Assistant

## 2013-05-07 VITALS — BP 121/78 | HR 91 | Temp 98.6°F | Resp 20 | Ht 62.0 in | Wt 138.0 lb

## 2013-05-07 DIAGNOSIS — R5381 Other malaise: Secondary | ICD-10-CM

## 2013-05-07 DIAGNOSIS — C50412 Malignant neoplasm of upper-outer quadrant of left female breast: Secondary | ICD-10-CM

## 2013-05-07 DIAGNOSIS — D701 Agranulocytosis secondary to cancer chemotherapy: Secondary | ICD-10-CM | POA: Insufficient documentation

## 2013-05-07 DIAGNOSIS — C50419 Malignant neoplasm of upper-outer quadrant of unspecified female breast: Secondary | ICD-10-CM

## 2013-05-07 DIAGNOSIS — D649 Anemia, unspecified: Secondary | ICD-10-CM

## 2013-05-07 LAB — CBC WITH DIFFERENTIAL/PLATELET
BASO%: 1.4 % (ref 0.0–2.0)
EOS%: 5.1 % (ref 0.0–7.0)
HGB: 11.2 g/dL — ABNORMAL LOW (ref 11.6–15.9)
MCH: 30.2 pg (ref 25.1–34.0)
MCHC: 34.4 g/dL (ref 31.5–36.0)
RBC: 3.72 10*6/uL (ref 3.70–5.45)
RDW: 12.5 % (ref 11.2–14.5)
lymph#: 1.4 10*3/uL (ref 0.9–3.3)

## 2013-05-07 MED ORDER — CIPROFLOXACIN HCL 500 MG PO TABS: 500.0000 mg | ORAL_TABLET | Freq: Two times a day (BID) | ORAL | Status: DC

## 2013-05-07 NOTE — Progress Notes (Signed)
IDGunnar Fusi Amaker OB: 1966-06-19  MR#: [id number]  WUJ#:[id number]  PCP: Nani Gasser, MD GYN:  Grant Ruts SU: Avel Peace OTHER MD: Hal Neer  CHIEF COMPLAINT: Left Breast Cancer  HISTORY OF PRESENT ILLNESS: Yun had routine screening mammography 03/09/2013 showing heterogeneously dense breasts, and a possible asymmetry in the left breast. Diagnostic left mammography and ultrasonography at the breast Center 03/30/2013 found a lobulated mass in the left breast upper outer quadrant measuring 1.6 cm. This was palpable. Ultrasound showed a complex microlobulated hypoechoic mass measuring 1.8 cm. There was no left axillary lymphadenopathy noted.  Biopsy of the mass in question 04/06/2013 showed ([id number]) and invasive ductal carcinoma, grade 3, triple negative, with an MIB-1 of 65%.  Bilateral breast MRI 04/13/2013 found an irregular mass measuring 2.5 cm in the left breast with a few lymph nodes that showed moderate cortical thickness, the largest measuring 12 mm (level I).  The patient's subsequent history is as detailed below  INTERVAL HISTORY: Debria returns alone today for followup of her locally advanced left breast carcinoma. She is currently day 8 cycle 1 of 4 planned dose dense cycles of doxorubicin/cyclophosphamide being given in the neoadjuvant setting. She receives Neulasta on day 2 for granulocyte support.  Overall, I think Ammi tolerated chemotherapy reasonably well. She does tell me that chemotherapy day last week was the "worse day of her life" due to "the unknown". She had a dose of Ativan during the infusion which seemed to help with her anxiety. I think the past week has been a little easier than she expected, although she is tired. She's had some intermittent aches and pains in the upper portion of her body since the Neulasta shot. She had some mild nausea but no emesis. Overall she tells me she feels "fragile".   REVIEW OF SYSTEMS: Hendy has had no known  fevers and no chills. She does have hot flashes. She's had no rashes or skin changes and denies any signs of abnormal bleeding. She's eating and drinking fairly well. The nausea has now resolved, and she is having regular bowel movements. She's had no cough, increased shortness of breath, or chest pain. She denies abnormal headaches or dizziness. Other than the aches and pains noted above, she's had no new or unusual myalgias, arthralgias, bony pain, or peripheral swelling. She also denies any mouth ulcers or oral sensitivity, and has had no problems swallowing. She continues to feel anxious about her cancer diagnosis. She continues to have some problems sleeping which she tells me "is not new".  A detailed review of systems is otherwise stable and noncontributory.   PAST MEDICAL HISTORY: Past Medical History  Diagnosis Date  . Fibroids   . Hypertension   . Hypothyroid   . Breast cancer     PAST SURGICAL HISTORY: Past Surgical History  Procedure Laterality Date  . Tubal ligation    . Portacath placement Right 04/27/2013    Procedure: ULTRASOUND GUIDED PORT INSERTION WITH FLUOROSCOPY;  Surgeon: Adolph Pollack, MD;  Location: [facility];  Service: General;  Laterality: Right;    FAMILY HISTORY Family History  Problem Relation Age of Onset  . Hypertension Mother   . Diabetes Mother   . Alzheimer's disease Mother   . Hypertension Father   . Diabetes Father   . Hypertension Brother    the patient's father died in his 53s from complications of diabetes. The patient's mother is living, in her mid 67s. The patient had 2 brothers, and 2 sisters. There is no  history of cancer in the family to her knowledge.  GYNECOLOGIC HISTORY:  Menarche age 47, first live birth age 47. The patient is GX P2. She is postmenopausal, but did have some perimenopausal bleeding, which was evaluated age 47 08/14/2012 with a transabdominal and transvaginal ultrasound of the pelvis which found a normal uterine  myometrium and left ovary. The right ovary could not be visualized.  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 She's currently out of work on disability due to her breast cancer and chemotherapy. Her husband [name] also works for the [employer] system. Their children are [name], 8, and Craig, 5. The patient attends a [employer]   ADVANCED DIRECTIVES: Not in place   HEALTH MAINTENANCE: (Updated 05/07/2013) History  Substance Use Topics  . Smoking status: Never Smoker   . Smokeless tobacco: Never Used  . Alcohol Use: 1.2 oz/week    2 Glasses of wine per week     Comment: occassion     Colonoscopy:- Never  PAP: 2013, Dr. Nicholaus Bloom  Bone density: Never  Lipid panel: Dr. Linford Arnold    No Known Allergies  Current Outpatient Prescriptions  Medication Sig Dispense Refill  . acetaminophen (TYLENOL) 500 MG tablet Take 500 mg by mouth every 6 (six) hours as needed for pain.      Marland Kitchen dexamethasone (DECADRON) 4 MG tablet Take 2 tablets by mouth starting the evening of chemotherapy and then take 2 tablets two times a day for the next 2 days. Take with food.  30 tablet  4  . hydrochlorothiazide (HYDRODIURIL) 25 MG tablet Take 25 mg by mouth daily after lunch.      Marland Kitchen HYDROcodone-acetaminophen (NORCO/VICODIN) 5-325 MG per tablet TAKE 1 OR 2 TABLETS BY MOUTH EVERY 6 HOURS AS NEEDED FOR PAIN  30 tablet  0  . levothyroxine (SYNTHROID, LEVOTHROID) 88 MCG tablet Take 88 mcg by mouth daily before breakfast.      . lidocaine-prilocaine (EMLA) cream Apply topically as needed.  30 g  0  . LORazepam (ATIVAN) 0.5 MG tablet Take at bedtime as instructed  30 tablet  0  . Multiple Vitamins-Minerals (MULTIVITAMIN WITH MINERALS) tablet Take 1 tablet by mouth daily.      . prochlorperazine (COMPAZINE) 10 MG tablet Take one tablet 4 times a day (ac and hs)starting the evening of chemotherapy, and continue an additional 3 days  30 tablet   4  . ciprofloxacin (CIPRO) 500 MG tablet Take 1 tablet (500 mg total) by mouth 2 (two) times daily.  14 tablet  3   No current facility-administered medications for this visit.    OBJECTIVE: [name] who appears stated age and is tired but in no acute distress Filed Vitals:   05/07/13 1504  BP: 121/78  Pulse: 91  Temp: 98.6 F (37 C)  Resp: 20     Body mass index is 25.23 kg/(m^2).    ECOG FS:1 Filed Weights   05/07/13 1504  Weight: 138 lb (62.596 kg)   Ocular: Sclerae unicteric, pupils equal, round and reactive to light Ear-nose-throat: Oropharynx clear, dentition fair. No evidence of ulcerations or candidiasis noted. Lymphatic: No cervical or supraclavicular adenopathy Lungs no rales or rhonchi, clear to auscultation bilaterally. Heart regular rate and rhythm, no murmur appreciated Abd soft, nontender, positive bowel sounds MSK no focal spinal tenderness, full range of motion in the upper extremities. No peripheral edema. Neuro: non-focal, well-oriented, mildly anxious affect Breasts:  Deferred.   LAB RESULTS:    Lab Results  Component Value Date   WBC 2.3* 05/07/2013   NEUTROABS 0.6* 05/07/2013   HGB 11.2* 05/07/2013   HCT 32.7* 05/07/2013   MCV 87.9 05/07/2013   PLT 172 05/07/2013      Chemistry      Component Value Date/Time   NA 141 04/30/2013 0956   NA 136 04/11/2013 0927   K 3.4* 04/30/2013 0956   K 3.9 04/11/2013 0927   CL 103 04/11/2013 0927   CO2 28 04/30/2013 0956   CO2 29 04/11/2013 0927   BUN 10.2 04/30/2013 0956   BUN 10 04/11/2013 0927   CREATININE 0.7 04/30/2013 0956   CREATININE 0.67 04/11/2013 0927   CREATININE 0.65 05/21/2009 2243      Component Value Date/Time   CALCIUM 9.7 04/30/2013 0956   CALCIUM 9.5 04/11/2013 0927   ALKPHOS 67 04/30/2013 0956   ALKPHOS 70 01/19/2013 0910   AST 17 04/30/2013 0956   AST 21 01/19/2013 0910   ALT 14 04/30/2013 0956   ALT 20 01/19/2013 0910   BILITOT 0.28 04/30/2013 0956   BILITOT 0.4  01/19/2013 0910        STUDIES: Chest 2 View  04/25/2013   ADDENDUM REPORT: 04/25/2013 12:29   Electronically Signed   By: Britta Mccreedy   On: 04/25/2013 12:29   04/25/2013   CLINICAL DATA:  Breast carcinoma. Preop for Port-A-Cath placement.  EXAM: CHEST  2 VIEW  COMPARISON:  None.  FINDINGS: Heart size is normal. Both lungs are clear. No evidence of pleural effusion. No mass or lymphadenopathy identified. Moderate S-shaped thoracolumbar scoliosis is noted.  IMPRESSION: No active cardiopulmonary disease.  Thoracolumbar scoliosis.  Electronically Signed: By: Myles Rosenthal On: 04/20/2013 14:28     ASSESSMENT: 47 y.o. [place] woman   (1)  status post left breast upper outer quadrant biopsy 04/06/2013 for a clinical T2 N1, stage IIB invasive ductal carcinoma, high-grade him a triple negative, with an MIB-1 of 65%  (2)  being treated in the neoadjuvant setting, the goal being to complete 4 dose dense cycles of doxorubicin/cyclophosphamide, the first of which was given on 04/30/2013. Patient received Neulasta on day 2 of each cycle for granulocyte support. This will be followed by 12 weekly cycles of carboplatin/paclitaxel, all prior to definitive surgery.    PLAN: Over half of our 50 minute appointment today was spent counseling Lakeithia with regards to her concerns, answering questions about chemotherapy side effects, reviewing our overall treatment plan, and coordinating care.   We reviewed neutropenic precautions. She started on Cipro prophylactically, 500 mg by mouth twice a day for 7 days, and she understands to contact us with any fevers of 100 or above.   We also reviewed her overall plan of treatment which will include 4 dose dense cycles of doxorubicin/cyclophosphamide followed by 12 weekly doses of carboplatin/paclitaxel. We will be repeating breast MRIs between the 2 regimens (in early December) and again after completion of neoadjuvant therapy, likely in late February or early March.   This case was reviewed with Dr. Darnelle Catalan, and his plan is to obtain additional staging studies after surgery.   I will return next week on October 20 for labs and followup with Dr. Darnelle Catalan in anticipation of day 1 cycle 2 of neoadjuvant chemotherapy. I will see her one week later for assessment of chemotoxicity. She voices understanding and agreement with our plan today, and knows to call with any changes or problems.  Suezette Lafave, PA-C  05/07/2013 4:05 PM

## 2013-05-07 NOTE — Telephone Encounter (Signed)
appts made and printed. Pt is aware that tx will be added. i emailed MW to add the tx's...td 

## 2013-05-08 ENCOUNTER — Encounter: Payer: Self-pay | Admitting: Oncology

## 2013-05-08 ENCOUNTER — Telehealth: Payer: Self-pay | Admitting: *Deleted

## 2013-05-08 NOTE — Telephone Encounter (Signed)
Per staff message and POF I have scheduled appts.  JMW  

## 2013-05-10 ENCOUNTER — Encounter: Payer: Self-pay | Admitting: *Deleted

## 2013-05-10 NOTE — Progress Notes (Signed)
[facility] Clinical Social Work  Clinical Social Work phoned Pt to follow up on wig vouchure and Wish List request. Pt did receive her wig earlier this week. CSW had been expecting Pt and then she did not come. Per Pt, she got her wig and was very happy with it. Pt sounded very upbeat on the phone and reported to be having a good day. Pt was struggling earlier in her tx. CSW added pt to list of Giving Tree and told her we would get more info closer to time. She was very Adult nurse. She reports her Alight guide has been helpful as well. She agrees to call us if needed.   Doreen Salvage, LCSW Clinical Social Worker [facility] Wednesday, Thursday and Friday Phone: [phone] Fax: [phone]

## 2013-05-11 ENCOUNTER — Ambulatory Visit: Payer: BC Managed Care – PPO | Admitting: Family Medicine

## 2013-05-14 ENCOUNTER — Ambulatory Visit (HOSPITAL_BASED_OUTPATIENT_CLINIC_OR_DEPARTMENT_OTHER): Payer: BC Managed Care – PPO

## 2013-05-14 ENCOUNTER — Other Ambulatory Visit (HOSPITAL_BASED_OUTPATIENT_CLINIC_OR_DEPARTMENT_OTHER): Payer: BC Managed Care – PPO | Admitting: Lab

## 2013-05-14 ENCOUNTER — Other Ambulatory Visit: Payer: BC Managed Care – PPO | Admitting: Lab

## 2013-05-14 ENCOUNTER — Other Ambulatory Visit: Payer: Self-pay | Admitting: Oncology

## 2013-05-14 ENCOUNTER — Ambulatory Visit (HOSPITAL_BASED_OUTPATIENT_CLINIC_OR_DEPARTMENT_OTHER): Payer: BC Managed Care – PPO | Admitting: Oncology

## 2013-05-14 VITALS — BP 121/85 | HR 80 | Temp 98.5°F | Resp 20 | Ht 62.0 in | Wt 138.2 lb

## 2013-05-14 DIAGNOSIS — C50419 Malignant neoplasm of upper-outer quadrant of unspecified female breast: Secondary | ICD-10-CM

## 2013-05-14 DIAGNOSIS — C50412 Malignant neoplasm of upper-outer quadrant of left female breast: Secondary | ICD-10-CM

## 2013-05-14 DIAGNOSIS — Z5111 Encounter for antineoplastic chemotherapy: Secondary | ICD-10-CM

## 2013-05-14 DIAGNOSIS — Z171 Estrogen receptor negative status [ER-]: Secondary | ICD-10-CM

## 2013-05-14 DIAGNOSIS — D649 Anemia, unspecified: Secondary | ICD-10-CM

## 2013-05-14 LAB — CBC WITH DIFFERENTIAL/PLATELET
Basophils Absolute: 0 10*3/uL (ref 0.0–0.1)
EOS%: 0.6 % (ref 0.0–7.0)
Eosinophils Absolute: 0 10*3/uL (ref 0.0–0.5)
HGB: 12.1 g/dL (ref 11.6–15.9)
LYMPH%: 32.1 % (ref 14.0–49.7)
MCH: 30.6 pg (ref 25.1–34.0)
MONO#: 0.8 10*3/uL (ref 0.1–0.9)
NEUT#: 2.7 10*3/uL (ref 1.5–6.5)
Platelets: 236 10*3/uL (ref 145–400)
RDW: 12.6 % (ref 11.2–14.5)
WBC: 5.3 10*3/uL (ref 3.9–10.3)
lymph#: 1.7 10*3/uL (ref 0.9–3.3)

## 2013-05-14 MED ORDER — DEXAMETHASONE SODIUM PHOSPHATE 20 MG/5ML IJ SOLN
12.0000 mg | Freq: Once | INTRAMUSCULAR | Status: AC
  Administered 2013-05-14: 12 mg via INTRAVENOUS

## 2013-05-14 MED ORDER — SODIUM CHLORIDE 0.9 % IV SOLN
Freq: Once | INTRAVENOUS | Status: AC
  Administered 2013-05-14: 11:00:00 via INTRAVENOUS

## 2013-05-14 MED ORDER — DOXORUBICIN HCL CHEMO IV INJECTION 2 MG/ML
60.0000 mg/m2 | Freq: Once | INTRAVENOUS | Status: AC
  Administered 2013-05-14: 100 mg via INTRAVENOUS
  Filled 2013-05-14: qty 50

## 2013-05-14 MED ORDER — SODIUM CHLORIDE 0.9 % IJ SOLN
10.0000 mL | INTRAMUSCULAR | Status: DC | PRN
  Administered 2013-05-14: 10 mL
  Filled 2013-05-14: qty 10

## 2013-05-14 MED ORDER — PALONOSETRON HCL INJECTION 0.25 MG/5ML
INTRAVENOUS | Status: AC
  Filled 2013-05-14: qty 5

## 2013-05-14 MED ORDER — PALONOSETRON HCL INJECTION 0.25 MG/5ML
0.2500 mg | Freq: Once | INTRAVENOUS | Status: AC
  Administered 2013-05-14: 0.25 mg via INTRAVENOUS

## 2013-05-14 MED ORDER — HEPARIN SOD (PORK) LOCK FLUSH 100 UNIT/ML IV SOLN
500.0000 [IU] | Freq: Once | INTRAVENOUS | Status: AC | PRN
  Administered 2013-05-14: 500 [IU]
  Filled 2013-05-14: qty 5

## 2013-05-14 MED ORDER — FOSAPREPITANT DIMEGLUMINE INJECTION 150 MG
150.0000 mg | Freq: Once | INTRAVENOUS | Status: AC
  Administered 2013-05-14: 150 mg via INTRAVENOUS
  Filled 2013-05-14: qty 5

## 2013-05-14 MED ORDER — SODIUM CHLORIDE 0.9 % IV SOLN
600.0000 mg/m2 | Freq: Once | INTRAVENOUS | Status: AC
  Administered 2013-05-14: 1000 mg via INTRAVENOUS
  Filled 2013-05-14: qty 50

## 2013-05-14 MED ORDER — LORAZEPAM 2 MG/ML IJ SOLN
INTRAMUSCULAR | Status: AC
  Filled 2013-05-14: qty 1

## 2013-05-14 MED ORDER — LORAZEPAM 2 MG/ML IJ SOLN
0.2500 mg | Freq: Once | INTRAMUSCULAR | Status: AC
  Administered 2013-05-14: 0.25 mg via INTRAVENOUS

## 2013-05-14 MED ORDER — DEXAMETHASONE SODIUM PHOSPHATE 20 MG/5ML IJ SOLN
INTRAMUSCULAR | Status: AC
  Filled 2013-05-14: qty 5

## 2013-05-14 NOTE — Patient Instructions (Signed)
[facility] Discharge Instructions for Patients Receiving Chemotherapy  Today you received the following chemotherapy agents Adriamycin and Cytoxan.  To help prevent nausea and vomiting after your treatment, we encourage you to take your nausea medication.   If you develop nausea and vomiting that is not controlled by your nausea medication, call the clinic.   BELOW ARE SYMPTOMS THAT SHOULD BE REPORTED IMMEDIATELY:  *FEVER GREATER THAN 100.5 F  *CHILLS WITH OR WITHOUT FEVER  NAUSEA AND VOMITING THAT IS NOT CONTROLLED WITH YOUR NAUSEA MEDICATION  *UNUSUAL SHORTNESS OF BREATH  *UNUSUAL BRUISING OR BLEEDING  TENDERNESS IN MOUTH AND THROAT WITH OR WITHOUT PRESENCE OF ULCERS  *URINARY PROBLEMS  *BOWEL PROBLEMS  UNUSUAL RASH Items with * indicate a potential emergency and should be followed up as soon as possible.  Feel free to call the clinic you have any questions or concerns. The clinic phone number is [phone].    

## 2013-05-14 NOTE — Progress Notes (Signed)
[name] OB: 05-26-1966  MR#: [id number]  WUJ#:[id number]  PCP: Nani Gasser, MD GYN:  Grant Ruts SU: Avel Peace OTHER MD: Hal Neer  CHIEF COMPLAINT: "I'm on chemo"  HISTORY OF PRESENT ILLNESS: Brittney Vance had routine screening mammography 03/09/2013 showing heterogeneously dense breasts, and a possible asymmetry in the left breast. Diagnostic left mammography and ultrasonography at the breast Center 03/30/2013 found a lobulated mass in the left breast upper outer quadrant measuring 1.6 cm. This was palpable. Ultrasound showed a complex microlobulated hypoechoic mass measuring 1.8 cm. There was no left axillary lymphadenopathy noted.  Biopsy of the mass in question 04/06/2013 showed ([id number]) and invasive ductal carcinoma, grade 3, triple negative, with an MIB-1 of 65%.  Bilateral breast MRI 04/13/2013 found an irregular mass measuring 2.5 cm in the left breast with a few lymph nodes that showed moderate cortical thickness, the largest measuring 12 mm (level I).  The patient's subsequent history is as detailed below  INTERVAL HISTORY: Brittney Vance returns today for followup of her locally advanced left breast carcinoma. She is currently day 1 cycle 2 of 4 planned dose-dense cycles of doxorubicin/ cyclophosphamide being given in the neoadjuvant setting. She receives Neulasta on day 2 for granulocyte support.  REVIEW OF SYSTEMS: Brittney Vance did remarkably well with her first treatment. She tolerated the Neulasta without significant aches and pains. She did feel very "emotional", and did not like the idea of "pudding poisoning her body". She feels this is but made her tired whole first week. She thought the nausea pills made her feel nauseated and some patients to complain about the actual active swallowing is causing nausea. She did not vomit however she has most of her hair in anticipation of actually losing it. She's had no unusual headaches, visual changes, gait imbalance, cough,  phlegm production, pleurisy, thrush, or change in bowel or bladder habits. A detailed review of systems today was otherwise noncontributory  PAST MEDICAL HISTORY: Past Medical History  Diagnosis Date  . Fibroids   . Hypertension   . Hypothyroid   . Breast cancer     PAST SURGICAL HISTORY: Past Surgical History  Procedure Laterality Date  . Tubal ligation    . Portacath placement Right 04/27/2013    Procedure: ULTRASOUND GUIDED PORT INSERTION WITH FLUOROSCOPY;  Surgeon: Adolph Pollack, MD;  Location: [facility];  Service: General;  Laterality: Right;    FAMILY HISTORY Family History  Problem Relation Age of Onset  . Hypertension Mother   . Diabetes Mother   . Alzheimer's disease Mother   . Hypertension Father   . Diabetes Father   . Hypertension Brother    the patient's father died in his 20s from complications of diabetes. The patient's mother is living, in her mid 37s. The patient had 2 brothers, and 2 sisters. There is no history of cancer in the family to her knowledge.  GYNECOLOGIC HISTORY:  Menarche age 1, first live birth age 1. The patient is GX P2. She is postmenopausal, but did have some perimenopausal bleeding, which was evaluated age 64 08/14/2012 with a transabdominal and transvaginal ultrasound of the pelvis which found a normal uterine myometrium and left ovary. The right ovary could not be visualized.  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 Her husband Brittney Vance also works for the [employer] system. Their children are Brittney Vance, 8, and Brittney Vance, 5. The patient  attends a [employer]   ADVANCED DIRECTIVES: Not in place   HEALTH MAINTENANCE: (Updated 05/07/2013) History  Substance Use Topics  . Smoking status: Never Smoker   . Smokeless tobacco: Never Used  . Alcohol Use: 1.2 oz/week    2 Glasses of wine  per week     Comment: occassion     Colonoscopy:- Never  PAP: 2013, Dr. Nicholaus Bloom  Bone density: Never  Lipid panel: Dr. Linford Arnold    No Known Allergies  Current Outpatient Prescriptions  Medication Sig Dispense Refill  . acetaminophen (TYLENOL) 500 MG tablet Take 500 mg by mouth every 6 (six) hours as needed for pain.      . ciprofloxacin (CIPRO) 500 MG tablet Take 1 tablet (500 mg total) by mouth 2 (two) times daily.  14 tablet  3  . dexamethasone (DECADRON) 4 MG tablet Take 2 tablets by mouth starting the evening of chemotherapy and then take 2 tablets two times a day for the next 2 days. Take with food.  30 tablet  4  . hydrochlorothiazide (HYDRODIURIL) 25 MG tablet Take 25 mg by mouth daily after lunch.      Marland Kitchen HYDROcodone-acetaminophen (NORCO/VICODIN) 5-325 MG per tablet TAKE 1 OR 2 TABLETS BY MOUTH EVERY 6 HOURS AS NEEDED FOR PAIN  30 tablet  0  . levothyroxine (SYNTHROID, LEVOTHROID) 88 MCG tablet Take 88 mcg by mouth daily before breakfast.      . lidocaine-prilocaine (EMLA) cream Apply topically as needed.  30 g  0  . LORazepam (ATIVAN) 0.5 MG tablet Take at bedtime as instructed  30 tablet  0  . Multiple Vitamins-Minerals (MULTIVITAMIN WITH MINERALS) tablet Take 1 tablet by mouth daily.      . prochlorperazine (COMPAZINE) 10 MG tablet Take one tablet 4 times a day (ac and hs)starting the evening of chemotherapy, and continue an additional 3 days  30 tablet  4   No current facility-administered medications for this visit.    OBJECTIVE: [name] in no acute distress   Filed Vitals:   05/14/13 0951  BP: 121/85  Pulse: 80  Temp: 98.5 F (36.9 C)  Resp: 20     Body mass index is 25.27 kg/(m^2).    ECOG FS:1 Filed Weights   05/14/13 0951  Weight: 138 lb 3.2 oz (62.687 kg)   Ocular: Sclerae unicteric, pupils equal, round and reactive to light Ear-nose-throat: Oropharynx clear, dentition fair.  Lymphatic: No cervical or supraclavicular  adenopathy Lungs no rales or rhonchi, clear to auscultation bilaterally. Heart regular rate and rhythm, no murmur appreciated Abd soft, nontender, positive bowel sounds MSK no focal spinal tenderness, full range of motion in the upper extremities. No peripheral edema. Neuro: non-focal, well-oriented, mildly anxious affect Breasts: The right breast is unremarkable. The port is in the upper right anterior chest, intact. The left breast mass is in the upper outer quadrant and it is palpable with a little effort. It feels approximately 2 cm and still firm. The left axilla is benign. There are no skin or nipple changes of concern n the left breast   LAB RESULTS:    Lab Results  Component Value Date   WBC 5.3 05/14/2013   NEUTROABS 2.7 05/14/2013   HGB 12.1 05/14/2013   HCT 35.0 05/14/2013   MCV 88.2 05/14/2013   PLT 236 05/14/2013      Chemistry      Component Value Date/Time   NA 141 04/30/2013 0956   NA 136 04/11/2013 0927  K 3.4* 04/30/2013 0956   K 3.9 04/11/2013 0927   CL 103 04/11/2013 0927   CO2 28 04/30/2013 0956   CO2 29 04/11/2013 0927   BUN 10.2 04/30/2013 0956   BUN 10 04/11/2013 0927   CREATININE 0.7 04/30/2013 0956   CREATININE 0.67 04/11/2013 0927   CREATININE 0.65 05/21/2009 2243      Component Value Date/Time   CALCIUM 9.7 04/30/2013 0956   CALCIUM 9.5 04/11/2013 0927   ALKPHOS 67 04/30/2013 0956   ALKPHOS 70 01/19/2013 0910   AST 17 04/30/2013 0956   AST 21 01/19/2013 0910   ALT 14 04/30/2013 0956   ALT 20 01/19/2013 0910   BILITOT 0.28 04/30/2013 0956   BILITOT 0.4 01/19/2013 0910        STUDIES: Chest 2 View  04/25/2013   ADDENDUM REPORT: 04/25/2013 12:29   Electronically Signed   By: Britta Mccreedy   On: 04/25/2013 12:29   04/25/2013   CLINICAL DATA:  Breast carcinoma. Preop for Port-A-Cath placement.  EXAM: CHEST  2 VIEW  COMPARISON:  None.  FINDINGS: Heart size is normal. Both lungs are clear. No evidence of pleural effusion. No mass or lymphadenopathy  identified. Moderate S-shaped thoracolumbar scoliosis is noted.  IMPRESSION: No active cardiopulmonary disease.  Thoracolumbar scoliosis.  Electronically Signed: By: Myles Rosenthal On: 04/20/2013 14:28     ASSESSMENT: 46 y.o. [place] woman   (1)  status post left breast upper outer quadrant biopsy 04/06/2013 for a clinical T2 N1, stage IIB invasive ductal carcinoma, high-grade, triple negative, with an MIB-1 of 65%  (2)  being treated in the neoadjuvant setting, the goal being to complete 4 dose dense cycles of doxorubicin/ cyclophosphamide, the first of which was given on 04/30/2013. Patient receives Neulasta on day 2 of each cycle. This will be followed by 12 weekly cycles of carboplatin/ paclitaxel prior to definitive surgery.    PLAN: Renae Fickle will receive her second of 4 cycles of doxorubicin/ cyclophosphamide today. I am making no changes in her supportive treatments. She thought she had to stay on ciprofloxacin the entire time, and I explained this is done only for 5 days when she is found to be significantly neutropenic. Since she is having some nausea taking pills, I told her Compazine could be taken on an as-needed basis. She has stopped her hydrocodone, she has no pain at present.  I am adding 0.25 of Ativan to her premeds, which I think will help prevent associated nausea issues. She has someone drive her home today.  Otherwise she is already scheduled for her next 2 treatments. She will see me again before she starts the carboplatin and paclitaxel to discuss the special side effects associated with those medications. She knows to call for any other issues that may develop for her next visit here.  Lowella Dell, MD   05/14/2013 10:09 AM

## 2013-05-15 ENCOUNTER — Ambulatory Visit (HOSPITAL_BASED_OUTPATIENT_CLINIC_OR_DEPARTMENT_OTHER): Payer: BC Managed Care – PPO

## 2013-05-15 ENCOUNTER — Encounter: Payer: Self-pay | Admitting: Dietician

## 2013-05-15 VITALS — BP 123/74 | HR 107 | Temp 98.3°F

## 2013-05-15 DIAGNOSIS — C50419 Malignant neoplasm of upper-outer quadrant of unspecified female breast: Secondary | ICD-10-CM

## 2013-05-15 DIAGNOSIS — Z5189 Encounter for other specified aftercare: Secondary | ICD-10-CM

## 2013-05-15 DIAGNOSIS — C50412 Malignant neoplasm of upper-outer quadrant of left female breast: Secondary | ICD-10-CM

## 2013-05-15 MED ORDER — PEGFILGRASTIM INJECTION 6 MG/0.6ML
6.0000 mg | Freq: Once | SUBCUTANEOUS | Status: AC
  Administered 2013-05-15: 6 mg via SUBCUTANEOUS
  Filled 2013-05-15: qty 0.6

## 2013-05-15 NOTE — Progress Notes (Signed)
Pt registered, but as a was a no-show for Breast Cancer Nutrition Class appointment scheduled for 05/15/2013 at 1800.

## 2013-05-17 ENCOUNTER — Ambulatory Visit: Payer: BC Managed Care – PPO | Admitting: Family Medicine

## 2013-05-21 ENCOUNTER — Encounter: Payer: Self-pay | Admitting: Physician Assistant

## 2013-05-21 ENCOUNTER — Telehealth: Payer: Self-pay | Admitting: *Deleted

## 2013-05-21 ENCOUNTER — Other Ambulatory Visit (HOSPITAL_BASED_OUTPATIENT_CLINIC_OR_DEPARTMENT_OTHER): Payer: BC Managed Care – PPO | Admitting: Lab

## 2013-05-21 ENCOUNTER — Ambulatory Visit (HOSPITAL_BASED_OUTPATIENT_CLINIC_OR_DEPARTMENT_OTHER): Payer: BC Managed Care – PPO | Admitting: Physician Assistant

## 2013-05-21 VITALS — BP 119/81 | HR 78 | Temp 98.6°F | Resp 20 | Ht 62.0 in | Wt 136.6 lb

## 2013-05-21 DIAGNOSIS — C50412 Malignant neoplasm of upper-outer quadrant of left female breast: Secondary | ICD-10-CM

## 2013-05-21 DIAGNOSIS — C50419 Malignant neoplasm of upper-outer quadrant of unspecified female breast: Secondary | ICD-10-CM

## 2013-05-21 DIAGNOSIS — D649 Anemia, unspecified: Secondary | ICD-10-CM | POA: Insufficient documentation

## 2013-05-21 DIAGNOSIS — Z171 Estrogen receptor negative status [ER-]: Secondary | ICD-10-CM

## 2013-05-21 LAB — CBC WITH DIFFERENTIAL/PLATELET
BASO%: 1.7 % (ref 0.0–2.0)
Basophils Absolute: 0.1 10*3/uL (ref 0.0–0.1)
EOS%: 0.3 % (ref 0.0–7.0)
HCT: 31.9 % — ABNORMAL LOW (ref 34.8–46.6)
HGB: 10.9 g/dL — ABNORMAL LOW (ref 11.6–15.9)
MCH: 30.1 pg (ref 25.1–34.0)
MCHC: 34.2 g/dL (ref 31.5–36.0)
MCV: 88.1 fL (ref 79.5–101.0)
MONO%: 11.9 % (ref 0.0–14.0)
NEUT%: 50.3 % (ref 38.4–76.8)
Platelets: 322 10*3/uL (ref 145–400)
RBC: 3.62 10*6/uL — ABNORMAL LOW (ref 3.70–5.45)
RDW: 12.9 % (ref 11.2–14.5)
lymph#: 1.1 10*3/uL (ref 0.9–3.3)

## 2013-05-21 LAB — COMPREHENSIVE METABOLIC PANEL (CC13)
ALT: 14 U/L (ref 0–55)
AST: 13 U/L (ref 5–34)
Alkaline Phosphatase: 90 U/L (ref 40–150)
BUN: 13 mg/dL (ref 7.0–26.0)
CO2: 29 mEq/L (ref 22–29)
Calcium: 9.9 mg/dL (ref 8.4–10.4)
Chloride: 104 mEq/L (ref 98–109)
Creatinine: 0.7 mg/dL (ref 0.6–1.1)

## 2013-05-21 NOTE — Progress Notes (Signed)
[name] OB: 1965-10-18  MR#: [id number]  ZHY#:[id number]  PCP: Nani Gasser, MD GYN:  Grant Ruts SU: Avel Peace OTHER MD: Hal Neer  CHIEF COMPLAINT:  Left Breast Cancer, neoadjuvant chemotherapy  HISTORY OF PRESENT ILLNESS: Brittney Vance had routine screening mammography 03/09/2013 showing heterogeneously dense breasts, and a possible asymmetry in the left breast. Diagnostic left mammography and ultrasonography at the breast Center 03/30/2013 found a lobulated mass in the left breast upper outer quadrant measuring 1.6 cm. This was palpable. Ultrasound showed a complex microlobulated hypoechoic mass measuring 1.8 cm. There was no left axillary lymphadenopathy noted.  Biopsy of the mass in question 04/06/2013 showed ([id number]) and invasive ductal carcinoma, grade 3, triple negative, with an MIB-1 of 65%.  Bilateral breast MRI 04/13/2013 found an irregular mass measuring 2.5 cm in the left breast with a few lymph nodes that showed moderate cortical thickness, the largest measuring 12 mm (level I).  The patient's subsequent history is as detailed below  INTERVAL HISTORY: Brittney Vance returns alone today for followup of her locally advanced left breast carcinoma. She is currently day 8cycle 2 of 4 planned dose-dense cycles of doxorubicin/ cyclophosphamide being given in the neoadjuvant setting. She receives Neulasta on day 2 for granulocyte support.  Overall, she is tolerating treatment well. She has begun to have some discoloration of the nailbeds bilaterally on her hands, but denies any soreness or tenderness, and has had no drainage or evidence of infection. Her skin is slightly hyperpigmented on the hands as well. She is mildly fatigued. She continues to be under quite a bit of stress at home. Her mother, who lives with Brittney Vance, has some early signs of dementia which can be frustrating.  We discussed the book "The 36 Hour Day" which I think would be helpful for Brittney Vance to read.  REVIEW  OF SYSTEMS: Kea has had no fevers, chills, or night sweats, but has had some hot flashes. She's had no signs of abnormal bruising or bleeding. She's eating and drinking fairly well, and has had no nausea or change in bowel or bladder habits. She denies any cough, phlegm production, shortness of breath, chest pain, or palpitations. She's had no abnormal headaches, dizziness, or change in vision, and also denies any myalgias, arthralgias, or bony pain. She did have some bony pain following the first cycle, but noticed less pain with cycle 2, even after the Neulasta injection. She's had no peripheral swelling.  A detailed review of systems is otherwise stable and noncontributory.   PAST MEDICAL HISTORY: Past Medical History  Diagnosis Date  . Fibroids   . Hypertension   . Hypothyroid   . Breast cancer     PAST SURGICAL HISTORY: Past Surgical History  Procedure Laterality Date  . Tubal ligation    . Portacath placement Right 04/27/2013    Procedure: ULTRASOUND GUIDED PORT INSERTION WITH FLUOROSCOPY;  Surgeon: Adolph Pollack, MD;  Location: [facility];  Service: General;  Laterality: Right;    FAMILY HISTORY Family History  Problem Relation Age of Onset  . Hypertension Mother   . Diabetes Mother   . Alzheimer's disease Mother   . Hypertension Father   . Diabetes Father   . Hypertension Brother    the patient's father died in his 82s from complications of diabetes. The patient's mother is living, in her mid 31s. The patient had 2 brothers, and 2 sisters. There is no history of cancer in the family to her knowledge.  GYNECOLOGIC HISTORY:  Menarche age 43, first live  birth age 58. The patient is GX P2. She is postmenopausal, but did have some perimenopausal bleeding, which was evaluated age 20 08/14/2012 with a transabdominal and transvaginal ultrasound of the pelvis which found a normal uterine myometrium and left ovary. The right ovary could not be visualized.  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 She's currently out of work on disability due to her breast cancer and chemotherapy. Her husband Brittney Vance also works for the [employer] system. Their children are Brittney Vance, 8, and Brittney Vance, 5. Brittney Vance's mother currently lives in the home as well. The patient attends a [employer]   ADVANCED DIRECTIVES: Not in place   HEALTH MAINTENANCE: (Updated 05/21/2013) History  Substance Use Topics  . Smoking status: Never Smoker   . Smokeless tobacco: Never Used  . Alcohol Use: 1.2 oz/week    2 Glasses of wine per week     Comment: occassion     Colonoscopy:- Never  PAP: 2013, Dr. Nicholaus Bloom  Bone density: Never  Lipid panel: Dr. Linford Arnold    No Known Allergies  Current Outpatient Prescriptions  Medication Sig Dispense Refill  . acetaminophen (TYLENOL) 500 MG tablet Take 500 mg by mouth every 6 (six) hours as needed for pain.      Marland Kitchen dexamethasone (DECADRON) 4 MG tablet Take 2 tablets by mouth starting the evening of chemotherapy and then take 2 tablets two times a day for the next 2 days. Take with food.  30 tablet  4  . hydrochlorothiazide (HYDRODIURIL) 25 MG tablet Take 25 mg by mouth daily after lunch.      . levothyroxine (SYNTHROID, LEVOTHROID) 88 MCG tablet Take 88 mcg by mouth daily before breakfast.      . lidocaine-prilocaine (EMLA) cream Apply topically as needed.  30 g  0  . prochlorperazine (COMPAZINE) 10 MG tablet Take one tablet 4 times a day (ac and hs)starting the evening of chemotherapy, and continue an additional 3 days  30 tablet  4  . ciprofloxacin (CIPRO) 500 MG tablet Take 1 tablet (500 mg total) by mouth 2 (two) times daily.  14 tablet  3  . HYDROcodone-acetaminophen (NORCO/VICODIN) 5-325 MG per tablet TAKE 1 OR 2 TABLETS BY MOUTH EVERY 6 HOURS AS NEEDED FOR PAIN  30 tablet  0   No current facility-administered medications for this visit.    OBJECTIVE: Young African  American woman in no acute distress   Filed Vitals:   05/21/13 1445  BP: 119/81  Pulse: 78  Temp: 98.6 F (37 C)  Resp: 20     Body mass index is 24.98 kg/(m^2).    ECOG FS:1 Filed Weights   05/21/13 1445  Weight: 136 lb 9.6 oz (61.961 kg)   Ocular: Sclerae unicteric, pupils equal, round and reactive to light Ear-nose-throat: Oropharynx clear, no ulcerations, and no evidence of candidiasis.  Lymphatic: No cervical or supraclavicular adenopathy Lungs no rales or rhonchi, clear to auscultation bilaterally. Heart regular rate and rhythm, no murmur appreciated Abd soft, nontender, positive bowel sounds MSK no focal spinal tenderness, full range of motion in the upper extremities. No peripheral edema. Neuro: non-focal, well-oriented, mildly anxious affect Skin: The hands and nailbeds are mildly hyperpigmented bilaterally, but with no drainage or evidence of infection.  The port is in the upper right anterior chest, intact, with no erythema or edema. Bre deferred. Axillae are benign bilaterally with no palpable lymphadenopathy.ast:     LAB RESULTS:  Lab Results  Component Value Date   WBC 3.0* 05/21/2013   NEUTROABS 1.5 05/21/2013   HGB 10.9* 05/21/2013   HCT 31.9* 05/21/2013   MCV 88.1 05/21/2013   PLT 322 05/21/2013      Chemistry      Component Value Date/Time   NA 142 05/21/2013 1433   NA 136 04/11/2013 0927   K 3.7 05/21/2013 1433   K 3.9 04/11/2013 0927   CL 103 04/11/2013 0927   CO2 29 05/21/2013 1433   CO2 29 04/11/2013 0927   BUN 13.0 05/21/2013 1433   BUN 10 04/11/2013 0927   CREATININE 0.7 05/21/2013 1433   CREATININE 0.67 04/11/2013 0927   CREATININE 0.65 05/21/2009 2243      Component Value Date/Time   CALCIUM 9.9 05/21/2013 1433   CALCIUM 9.5 04/11/2013 0927   ALKPHOS 90 05/21/2013 1433   ALKPHOS 70 01/19/2013 0910   AST 13 05/21/2013 1433   AST 21 01/19/2013 0910   ALT 14 05/21/2013 1433   ALT 20 01/19/2013 0910   BILITOT <0.20 05/21/2013 1433    BILITOT 0.4 01/19/2013 0910        STUDIES: Chest 2 View  04/25/2013   ADDENDUM REPORT: 04/25/2013 12:29   Electronically Signed   By: Britta Mccreedy   On: 04/25/2013 12:29   04/25/2013   CLINICAL DATA:  Breast carcinoma. Preop for Port-A-Cath placement.  EXAM: CHEST  2 VIEW  COMPARISON:  None.  FINDINGS: Heart size is normal. Both lungs are clear. No evidence of pleural effusion. No mass or lymphadenopathy identified. Moderate S-shaped thoracolumbar scoliosis is noted.  IMPRESSION: No active cardiopulmonary disease.  Thoracolumbar scoliosis.  Electronically Signed: By: Myles Rosenthal On: 04/20/2013 14:28     ASSESSMENT: 47 y.o. [place] woman   (1)  status post left breast upper outer quadrant biopsy 04/06/2013 for a clinical T2 N1, stage IIB invasive ductal carcinoma, high-grade, triple negative, with an MIB-1 of 65%  (2)  being treated in the neoadjuvant setting, the goal being to complete 4 dose dense cycles of doxorubicin/ cyclophosphamide, the first of which was given on 04/30/2013. Patient receives Neulasta on day 2 of each cycle. This will be followed by 12 weekly cycles of carboplatin/ paclitaxel prior to definitive surgery.    PLAN: Samanta continues to tolerate the doxorubicin/cyclophosphamide well, and return to see me next week on November 3 in anticipation of day 1 cycle 3. Due to the upcoming holidays, we are going to try to schedule out as many appointments as possible in late November and December, especially since Lailoni is trying to coordinate taking care of her children and her mother.   This was all reviewed with the patient today who voices understanding and agreement with our plan. She will call with any changes or problems prior to her next appointment.   Angelea Penny, PA-C   05/21/2013 4:34 PM

## 2013-05-21 NOTE — Telephone Encounter (Signed)
appts made and printed. Pt is aware that tx's will be added. i emailed MW to add the tx's. Pt is aware cs will call for w/ an MR appt....td

## 2013-05-22 ENCOUNTER — Encounter: Payer: Self-pay | Admitting: Family Medicine

## 2013-05-22 ENCOUNTER — Ambulatory Visit (INDEPENDENT_AMBULATORY_CARE_PROVIDER_SITE_OTHER): Payer: BC Managed Care – PPO | Admitting: Family Medicine

## 2013-05-22 ENCOUNTER — Telehealth: Payer: Self-pay | Admitting: *Deleted

## 2013-05-22 VITALS — BP 138/91 | HR 97 | Wt 139.0 lb

## 2013-05-22 DIAGNOSIS — I10 Essential (primary) hypertension: Secondary | ICD-10-CM

## 2013-05-22 DIAGNOSIS — R590 Localized enlarged lymph nodes: Secondary | ICD-10-CM

## 2013-05-22 DIAGNOSIS — R599 Enlarged lymph nodes, unspecified: Secondary | ICD-10-CM

## 2013-05-22 NOTE — Telephone Encounter (Signed)
Per staff message and POF I have scheduled appts.  JMW  

## 2013-05-22 NOTE — Progress Notes (Signed)
  Subjective:    Patient ID: Brittney Vance, female    DOB: January 04, 1966, 47 y.o.   MRN: [id number]  HPI HTN-  Pt denies chest pain, SOB, dizziness, or heart palpitations.  Taking meds as directed w/o problems.  Denies medication side effects.  Currently undergoing chemotherapy for breast cancer. She feels very fatigued weak for treatments, but the week off she feels like it better. No other recent changes in medications. She was recently given a prescription for ciprofloxacin to take it she started getting symptoms of urinary tract infection. She's not currently taking this.    Review of Systems She denies any recent sore throat, fevers chills, or upper respiratory symptoms. No swallowing difficulty. She denies any recent allergy symptoms.    Objective:   Physical Exam  Constitutional: She is oriented to person, place, and time. She appears well-developed and well-nourished.  HENT:  Head: Normocephalic and atraumatic.  Neck: Neck supple.  She does have a approximately 1-1/2 cm swollen lymph node over the left anterior cervical region. Nontender. No rash or erythema over the area.  Cardiovascular: Normal rate, regular rhythm and normal heart sounds.   Pulmonary/Chest: Effort normal and breath sounds normal.  Neurological: She is alert and oriented to person, place, and time.  Skin: Skin is warm and dry.  Psychiatric: She has a normal mood and affect. Her behavior is normal.          Assessment & Plan:  HTN - much improved but not at all today. But when I look back at her blood pressures from her recent visit to oncology it does look like she is overall well controlled. Continue to watch diet and salt intake. Encourage regular walking and exercise as able to. Followup in 6 months. If her blood pressure is starting to become elevated when she goes for her oncology visits and encouraged her to followup sooner.  Small swollen lymph node left anterior cervical area-encouraged her to ask her  oncologist to keep an eye on this over the next couple of months. We did discuss potential reasons for a cervical lymph node to be swollen. The causes are quite extensive. Just encourage her to keep an eye on it and if it becomes hard like a rock or is growing in size to please let me, or her oncologist, know about it.

## 2013-05-28 ENCOUNTER — Other Ambulatory Visit: Payer: BC Managed Care – PPO | Admitting: Lab

## 2013-05-28 ENCOUNTER — Other Ambulatory Visit (HOSPITAL_BASED_OUTPATIENT_CLINIC_OR_DEPARTMENT_OTHER): Payer: BC Managed Care – PPO | Admitting: Lab

## 2013-05-28 ENCOUNTER — Ambulatory Visit (HOSPITAL_BASED_OUTPATIENT_CLINIC_OR_DEPARTMENT_OTHER): Payer: BC Managed Care – PPO | Admitting: Physician Assistant

## 2013-05-28 ENCOUNTER — Encounter: Payer: Self-pay | Admitting: Physician Assistant

## 2013-05-28 ENCOUNTER — Ambulatory Visit (HOSPITAL_BASED_OUTPATIENT_CLINIC_OR_DEPARTMENT_OTHER): Payer: BC Managed Care – PPO

## 2013-05-28 VITALS — BP 131/89 | HR 99 | Temp 99.1°F | Resp 18 | Ht 62.0 in | Wt 138.8 lb

## 2013-05-28 DIAGNOSIS — D649 Anemia, unspecified: Secondary | ICD-10-CM

## 2013-05-28 DIAGNOSIS — C50412 Malignant neoplasm of upper-outer quadrant of left female breast: Secondary | ICD-10-CM

## 2013-05-28 DIAGNOSIS — C50419 Malignant neoplasm of upper-outer quadrant of unspecified female breast: Secondary | ICD-10-CM

## 2013-05-28 DIAGNOSIS — Z5111 Encounter for antineoplastic chemotherapy: Secondary | ICD-10-CM

## 2013-05-28 DIAGNOSIS — Z171 Estrogen receptor negative status [ER-]: Secondary | ICD-10-CM

## 2013-05-28 LAB — CBC WITH DIFFERENTIAL/PLATELET
Basophils Absolute: 0.1 10*3/uL (ref 0.0–0.1)
EOS%: 0.2 % (ref 0.0–7.0)
Eosinophils Absolute: 0 10*3/uL (ref 0.0–0.5)
HCT: 32.7 % — ABNORMAL LOW (ref 34.8–46.6)
HGB: 11.2 g/dL — ABNORMAL LOW (ref 11.6–15.9)
LYMPH%: 21.4 % (ref 14.0–49.7)
MCH: 30 pg (ref 25.1–34.0)
MCV: 87.7 fL (ref 79.5–101.0)
MONO%: 12.9 % (ref 0.0–14.0)
NEUT#: 4.2 10*3/uL (ref 1.5–6.5)
NEUT%: 64.6 % (ref 38.4–76.8)
Platelets: 254 10*3/uL (ref 145–400)
RDW: 13.5 % (ref 11.2–14.5)
WBC: 6.5 10*3/uL (ref 3.9–10.3)

## 2013-05-28 MED ORDER — LIDOCAINE-PRILOCAINE 2.5-2.5 % EX CREA: TOPICAL_CREAM | CUTANEOUS | Status: DC | PRN

## 2013-05-28 MED ORDER — DEXAMETHASONE SODIUM PHOSPHATE 20 MG/5ML IJ SOLN
12.0000 mg | Freq: Once | INTRAMUSCULAR | Status: AC
  Administered 2013-05-28: 12 mg via INTRAVENOUS

## 2013-05-28 MED ORDER — DEXAMETHASONE SODIUM PHOSPHATE 20 MG/5ML IJ SOLN
INTRAMUSCULAR | Status: AC
  Filled 2013-05-28: qty 5

## 2013-05-28 MED ORDER — HEPARIN SOD (PORK) LOCK FLUSH 100 UNIT/ML IV SOLN
500.0000 [IU] | Freq: Once | INTRAVENOUS | Status: AC | PRN
  Administered 2013-05-28: 500 [IU]
  Filled 2013-05-28: qty 5

## 2013-05-28 MED ORDER — PALONOSETRON HCL INJECTION 0.25 MG/5ML
INTRAVENOUS | Status: AC
  Filled 2013-05-28: qty 5

## 2013-05-28 MED ORDER — SODIUM CHLORIDE 0.9 % IV SOLN
600.0000 mg/m2 | Freq: Once | INTRAVENOUS | Status: AC
  Administered 2013-05-28: 1000 mg via INTRAVENOUS
  Filled 2013-05-28: qty 50

## 2013-05-28 MED ORDER — LORAZEPAM 2 MG/ML IJ SOLN: 0.2500 mg | Freq: Once | INTRAMUSCULAR | Status: DC

## 2013-05-28 MED ORDER — SODIUM CHLORIDE 0.9 % IV SOLN
Freq: Once | INTRAVENOUS | Status: AC
  Administered 2013-05-28: 13:00:00 via INTRAVENOUS

## 2013-05-28 MED ORDER — SODIUM CHLORIDE 0.9 % IV SOLN
150.0000 mg | Freq: Once | INTRAVENOUS | Status: AC
  Administered 2013-05-28: 150 mg via INTRAVENOUS
  Filled 2013-05-28: qty 5

## 2013-05-28 MED ORDER — DOXORUBICIN HCL CHEMO IV INJECTION 2 MG/ML
60.0000 mg/m2 | Freq: Once | INTRAVENOUS | Status: AC
  Administered 2013-05-28: 100 mg via INTRAVENOUS
  Filled 2013-05-28: qty 50

## 2013-05-28 MED ORDER — PALONOSETRON HCL INJECTION 0.25 MG/5ML
0.2500 mg | Freq: Once | INTRAVENOUS | Status: AC
  Administered 2013-05-28: 0.25 mg via INTRAVENOUS

## 2013-05-28 MED ORDER — SODIUM CHLORIDE 0.9 % IJ SOLN
10.0000 mL | INTRAMUSCULAR | Status: DC | PRN
  Administered 2013-05-28: 10 mL
  Filled 2013-05-28: qty 10

## 2013-05-28 NOTE — Progress Notes (Signed)
[name] OB: Nov 22, 1965  MR#: [id number]  WUJ#:[id number]  PCP: Brittney Gasser, MD GYN:  Brittney Vance SU: Brittney Vance OTHER MD: Brittney Vance  CHIEF COMPLAINT:  Left Breast Cancer, neoadjuvant chemotherapy  HISTORY OF PRESENT ILLNESS: Brittney Vance had routine screening mammography 03/09/2013 showing heterogeneously dense breasts, and a possible asymmetry in the left breast. Diagnostic left mammography and ultrasonography at the breast Center 03/30/2013 found a lobulated mass in the left breast upper outer quadrant measuring 1.6 cm. This was palpable. Ultrasound showed a complex microlobulated hypoechoic mass measuring 1.8 cm. There was no left axillary lymphadenopathy noted.  Biopsy of the mass in question 04/06/2013 showed ([id number]) and invasive ductal carcinoma, grade 3, triple negative, with an MIB-1 of 65%.  Bilateral breast MRI 04/13/2013 found an irregular mass measuring 2.5 cm in the left breast with a few lymph nodes that showed moderate cortical thickness, the largest measuring 12 mm (level I).  The patient's subsequent history is as detailed below  INTERVAL HISTORY: Brittney Vance returns alone today for followup of her locally advanced left breast carcinoma. She is due for day 1 cycle 3 of 4 planned dose-dense cycles of doxorubicin/ cyclophosphamide being given in the neoadjuvant setting. She receives Neulasta on day 2 for granulocyte support.  Overall, she is tolerating treatment well. She's had a few more hot flashes than usual, but denies any fevers or chills. She has some taste aversion, but no nausea or emesis, and no change in bowel habits. She had no significant bony pain following the Neulasta injection on day 2.   REVIEW OF SYSTEMS: Brittney Vance has had no signs of abnormal bruising or bleeding. She denies any cough, phlegm production, shortness of breath, chest pain, or palpitations. She's had no abnormal headaches, dizziness, or change in vision, and also denies any myalgias,  arthralgias, or bony pain. She's had no peripheral swelling. She has a small and large lymph node in the left neck, but denies any recent ulcerations, pharyngitis, or infectious processes.  A detailed review of systems is otherwise stable and noncontributory.   PAST MEDICAL HISTORY: Past Medical History  Diagnosis Date  . Fibroids   . Hypertension   . Hypothyroid   . Breast cancer     PAST SURGICAL HISTORY: Past Surgical History  Procedure Laterality Date  . Tubal ligation    . Portacath placement Right 04/27/2013    Procedure: ULTRASOUND GUIDED PORT INSERTION WITH FLUOROSCOPY;  Surgeon: Adolph Pollack, MD;  Location: [facility];  Service: General;  Laterality: Right;    FAMILY HISTORY Family History  Problem Relation Age of Onset  . Hypertension Mother   . Diabetes Mother   . Alzheimer's disease Mother   . Hypertension Father   . Diabetes Father   . Hypertension Brother    the patient's father died in his 61s from complications of diabetes. The patient's mother is living, in her mid 10s. The patient had 2 brothers, and 2 sisters. There is no history of cancer in the family to her knowledge.  GYNECOLOGIC HISTORY:  Menarche age 62, first live birth age 74. The patient is GX P2. She is postmenopausal, but did have some perimenopausal bleeding, which was evaluated age 63 08/14/2012 with a transabdominal and transvaginal ultrasound of the pelvis which found a normal uterine myometrium and left ovary. The right ovary could not be visualized.  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 Her husband [name] also works for the [employer] system. Their children are [name], 8, and [place], 5. Brittney Vance mother currently lives in the home as well. The patient attends a [employer]   ADVANCED DIRECTIVES: Not in place   HEALTH  MAINTENANCE: (Updated 05/21/2013) History  Substance Use Topics  . Smoking status: Never Smoker   . Smokeless tobacco: Never Used  . Alcohol Use: 1.2 oz/week    2 Glasses of wine per week     Comment: occassion     Colonoscopy:- Never  PAP: 2013, Dr. Nicholaus Bloom  Bone density: Never  Lipid panel: Dr. Linford Arnold    No Known Allergies  Current Outpatient Prescriptions  Medication Sig Dispense Refill  . dexamethasone (DECADRON) 4 MG tablet Take 2 tablets by mouth starting the evening of chemotherapy and then take 2 tablets two times a day for the next 2 days. Take with food.  30 tablet  4  . hydrochlorothiazide (HYDRODIURIL) 25 MG tablet Take 25 mg by mouth daily after lunch.      . levothyroxine (SYNTHROID, LEVOTHROID) 88 MCG tablet Take 88 mcg by mouth daily before breakfast.      . lidocaine-prilocaine (EMLA) cream Apply topically as needed. 1-2 hrs before each procedure  30 g  2  . prochlorperazine (COMPAZINE) 10 MG tablet Take one tablet 4 times a day (ac and hs)starting the evening of chemotherapy, and continue an additional 3 days  30 tablet  4  . ciprofloxacin (CIPRO) 500 MG tablet Take 1 tablet (500 mg total) by mouth 2 (two) times daily.  14 tablet  3  . HYDROcodone-acetaminophen (NORCO/VICODIN) 5-325 MG per tablet TAKE 1 OR 2 TABLETS BY MOUTH EVERY 6 HOURS AS NEEDED FOR PAIN  30 tablet  0   No current facility-administered medications for this visit.    OBJECTIVE: [name] in no acute distress   Filed Vitals:   05/28/13 1055  BP: 131/89  Pulse: 99  Temp: 99.1 F (37.3 C)  Resp: 18     Body mass index is 25.38 kg/(m^2).    ECOG FS:1 Filed Weights   05/28/13 1055  Weight: 138 lb 12.8 oz (62.959 kg)   Physical Exam: HEENT:  Sclerae anicteric.  Oropharynx clear. No ulceration or evidence of candidiasis. No pharyngeal erythema. NODES:  There is a small palpable node in the left cervical chain, measuring approximately 1 cm, soft and movable. No  additional cervical lymphadenopathy palpated No supraclavicular lymphadenopathy palpated.  BREAST EXAM:  Right breast is unremarkable. In the lateral edge of the left breast I can palpate the abnormality, which currently measures approximately 1.5 cm. Axillae are benign bilaterally palpable lymphadenopathy. LUNGS:  Clear to auscultation bilaterally.  No wheezes or rhonchi HEART:  Regular rate and rhythm. No murmur  ABDOMEN:  Soft, nontender.  Positive bowel sounds.  MSK:  No focal spinal tenderness to palpation. Full range of motion in the upper extremities. EXTREMITIES:  No peripheral edema.   SKIN:  Ben port is intact in the right upper chest wall, no erythema or edema and no evidence of infection.  NEURO:  Nonfocal. Well oriented.    Positive affect.     LAB RESULTS:    Lab Results  Component Value Date   WBC 6.5 05/28/2013   NEUTROABS 4.2 05/28/2013   HGB 11.2* 05/28/2013   HCT 32.7* 05/28/2013   MCV 87.7 05/28/2013   PLT 254 05/28/2013      Chemistry  Component Value Date/Time   NA 142 05/21/2013 1433   NA 136 04/11/2013 0927   K 3.7 05/21/2013 1433   K 3.9 04/11/2013 0927   CL 103 04/11/2013 0927   CO2 29 05/21/2013 1433   CO2 29 04/11/2013 0927   BUN 13.0 05/21/2013 1433   BUN 10 04/11/2013 0927   CREATININE 0.7 05/21/2013 1433   CREATININE 0.67 04/11/2013 0927   CREATININE 0.65 05/21/2009 2243      Component Value Date/Time   CALCIUM 9.9 05/21/2013 1433   CALCIUM 9.5 04/11/2013 0927   ALKPHOS 90 05/21/2013 1433   ALKPHOS 70 01/19/2013 0910   AST 13 05/21/2013 1433   AST 21 01/19/2013 0910   ALT 14 05/21/2013 1433   ALT 20 01/19/2013 0910   BILITOT <0.20 05/21/2013 1433   BILITOT 0.4 01/19/2013 0910        STUDIES: No results found.     ASSESSMENT: 47 y.o. [place] woman   (1)  status post left breast upper outer quadrant biopsy 04/06/2013 for a clinical T2 N1, stage IIB invasive ductal carcinoma, high-grade, triple negative, with an MIB-1 of 65%  (2)   being treated in the neoadjuvant setting, the goal being to complete 4 dose dense cycles of doxorubicin/ cyclophosphamide, the first of which was given on 04/30/2013. Patient receives Neulasta on day 2 of each cycle. This will be followed by 12 weekly cycles of carboplatin/ paclitaxel prior to definitive surgery.    PLAN: Zamiyah continues to tolerate treatment well and will proceed with her third cycle today as scheduled.  She'll receive her Neulasta injection tomorrow, and I will see her again next week for assessment of chemotoxicity on November 10.   We'll continue to follow the enlarged lymph node on the left side of her neck, and I feel like this is just a reactive or inflammatory node.   Kilynn is already scheduled for a repeat breast MRI on November 21 after the completion of all 4 cycles of doxorubicin/cyclophosphamide. She will see Dr. Darnelle Catalan soon thereafter to review those results, and will also need to see Dr. Abbey Chatters.   This was all reviewed with the patient today who voices understanding and agreement with our plan. She will call with any changes or problems prior to her next appointment.   Zollie Scale, PA-C   05/28/2013 12:34 PM

## 2013-05-28 NOTE — Patient Instructions (Signed)
[facility] Discharge Instructions for Patients Receiving Chemotherapy  Today you received the following chemotherapy agents Adriamycin and Cytoxan.  To help prevent nausea and vomiting after your treatment, we encourage you to take your nausea medication as prescribed.   If you develop nausea and vomiting that is not controlled by your nausea medication, call the clinic.   BELOW ARE SYMPTOMS THAT SHOULD BE REPORTED IMMEDIATELY:  *FEVER GREATER THAN 100.5 F  *CHILLS WITH OR WITHOUT FEVER  NAUSEA AND VOMITING THAT IS NOT CONTROLLED WITH YOUR NAUSEA MEDICATION  *UNUSUAL SHORTNESS OF BREATH  *UNUSUAL BRUISING OR BLEEDING  TENDERNESS IN MOUTH AND THROAT WITH OR WITHOUT PRESENCE OF ULCERS  *URINARY PROBLEMS  *BOWEL PROBLEMS  UNUSUAL RASH Items with * indicate a potential emergency and should be followed up as soon as possible.  Feel free to call the clinic you have any questions or concerns. The clinic phone number is [phone].    

## 2013-05-29 ENCOUNTER — Ambulatory Visit (HOSPITAL_BASED_OUTPATIENT_CLINIC_OR_DEPARTMENT_OTHER): Payer: BC Managed Care – PPO

## 2013-05-29 ENCOUNTER — Telehealth: Payer: Self-pay | Admitting: *Deleted

## 2013-05-29 VITALS — BP 130/76 | HR 117 | Temp 98.1°F

## 2013-05-29 DIAGNOSIS — C50419 Malignant neoplasm of upper-outer quadrant of unspecified female breast: Secondary | ICD-10-CM

## 2013-05-29 DIAGNOSIS — C50412 Malignant neoplasm of upper-outer quadrant of left female breast: Secondary | ICD-10-CM

## 2013-05-29 DIAGNOSIS — Z5189 Encounter for other specified aftercare: Secondary | ICD-10-CM

## 2013-05-29 MED ORDER — PEGFILGRASTIM INJECTION 6 MG/0.6ML
6.0000 mg | Freq: Once | SUBCUTANEOUS | Status: AC
  Administered 2013-05-29: 6 mg via SUBCUTANEOUS
  Filled 2013-05-29: qty 0.6

## 2013-05-29 NOTE — Telephone Encounter (Signed)
sw pt gv appt for  Dr. Abbey Chatters  For 06/19/13 @ 9:45am. Pt is aware.Brittney Vance

## 2013-05-30 ENCOUNTER — Other Ambulatory Visit: Payer: Self-pay | Admitting: Family Medicine

## 2013-06-04 ENCOUNTER — Encounter: Payer: Self-pay | Admitting: Physician Assistant

## 2013-06-04 ENCOUNTER — Ambulatory Visit (HOSPITAL_BASED_OUTPATIENT_CLINIC_OR_DEPARTMENT_OTHER): Payer: BC Managed Care – PPO | Admitting: Physician Assistant

## 2013-06-04 ENCOUNTER — Ambulatory Visit (HOSPITAL_BASED_OUTPATIENT_CLINIC_OR_DEPARTMENT_OTHER): Payer: BC Managed Care – PPO | Admitting: Lab

## 2013-06-04 VITALS — BP 124/81 | HR 86 | Temp 99.1°F | Resp 20 | Ht 62.0 in | Wt 136.1 lb

## 2013-06-04 DIAGNOSIS — D649 Anemia, unspecified: Secondary | ICD-10-CM

## 2013-06-04 DIAGNOSIS — C50412 Malignant neoplasm of upper-outer quadrant of left female breast: Secondary | ICD-10-CM

## 2013-06-04 DIAGNOSIS — C50419 Malignant neoplasm of upper-outer quadrant of unspecified female breast: Secondary | ICD-10-CM

## 2013-06-04 DIAGNOSIS — Z171 Estrogen receptor negative status [ER-]: Secondary | ICD-10-CM

## 2013-06-04 LAB — COMPREHENSIVE METABOLIC PANEL (CC13)
Albumin: 3.9 g/dL (ref 3.5–5.0)
Anion Gap: 10 mEq/L (ref 3–11)
BUN: 13 mg/dL (ref 7.0–26.0)
CO2: 27 mEq/L (ref 22–29)
Calcium: 9.5 mg/dL (ref 8.4–10.4)
Chloride: 103 mEq/L (ref 98–109)
Creatinine: 0.6 mg/dL (ref 0.6–1.1)
Sodium: 140 mEq/L (ref 136–145)
Total Bilirubin: 0.29 mg/dL (ref 0.20–1.20)

## 2013-06-04 LAB — CBC WITH DIFFERENTIAL/PLATELET
BASO%: 1.1 % (ref 0.0–2.0)
Basophils Absolute: 0 10*3/uL (ref 0.0–0.1)
HCT: 32.4 % — ABNORMAL LOW (ref 34.8–46.6)
HGB: 11.2 g/dL — ABNORMAL LOW (ref 11.6–15.9)
LYMPH%: 25.2 % (ref 14.0–49.7)
MCH: 30.5 pg (ref 25.1–34.0)
MCHC: 34.6 g/dL (ref 31.5–36.0)
MCV: 88 fL (ref 79.5–101.0)
MONO#: 0.1 10*3/uL (ref 0.1–0.9)
MONO%: 5.4 % (ref 0.0–14.0)
NEUT%: 67.8 % (ref 38.4–76.8)
Platelets: 211 10*3/uL (ref 145–400)
WBC: 2.4 10*3/uL — ABNORMAL LOW (ref 3.9–10.3)
lymph#: 0.6 10*3/uL — ABNORMAL LOW (ref 0.9–3.3)

## 2013-06-04 NOTE — Progress Notes (Signed)
IDGunnar Fusi Vance OB: 03-20-1966  MR#: [id number]  NFA#:[id number]  PCP: Nani Gasser, MD GYN:  Grant Ruts SU: Avel Peace OTHER MD: Hal Neer  CHIEF COMPLAINT:  Left Breast Cancer, neoadjuvant chemotherapy  HISTORY OF PRESENT ILLNESS: Brittney Vance had routine screening mammography 03/09/2013 showing heterogeneously dense breasts, and a possible asymmetry in the left breast. Diagnostic left mammography and ultrasonography at the breast Center 03/30/2013 found a lobulated mass in the left breast upper outer quadrant measuring 1.6 cm. This was palpable. Ultrasound showed a complex microlobulated hypoechoic mass measuring 1.8 cm. There was no left axillary lymphadenopathy noted.  Biopsy of the mass in question 04/06/2013 showed ([id number]) and invasive ductal carcinoma, grade 3, triple negative, with an MIB-1 of 65%.  Bilateral breast MRI 04/13/2013 found an irregular mass measuring 2.5 cm in the left breast with a few lymph nodes that showed moderate cortical thickness, the largest measuring 12 mm (level I).  The patient's subsequent history is as detailed below  INTERVAL HISTORY: Brittney Vance returns alone today for followup of her locally advanced left breast carcinoma. She is currently day 8 cycle 3 of 4 planned dose-dense cycles of doxorubicin/ cyclophosphamide being given in the neoadjuvant setting. She receives Neulasta on day 2 for granulocyte support.  [name] continues to tolerate her treatment well, with no new complaints today.   REVIEW OF SYSTEMS: Brittney Vance has had no fevers, chills, night sweats, rashes, and denies any signs of abnormal bruising or bleeding. Her energy level is fair. She continues to have occasional hot flashes, but actually feels like these have lessened over the past week. She's eating and drinking fairly well despite some taste aversion. She denies any nausea, emesis, or change in bowel or bladder habits. She denies any cough, phlegm production, shortness of breath,  chest pain, or palpitations. She's had no abnormal headaches, dizziness, or change in vision, and also denies any myalgias, arthralgias, or bony pain. She's had no peripheral swelling.   A detailed review of systems is otherwise stable and noncontributory.   PAST MEDICAL HISTORY: Past Medical History  Diagnosis Date  . Fibroids   . Hypertension   . Hypothyroid   . Breast cancer     PAST SURGICAL HISTORY: Past Surgical History  Procedure Laterality Date  . Tubal ligation    . Portacath placement Right 04/27/2013    Procedure: ULTRASOUND GUIDED PORT INSERTION WITH FLUOROSCOPY;  Surgeon: Adolph Pollack, MD;  Location: [facility];  Service: General;  Laterality: Right;    FAMILY HISTORY Family History  Problem Relation Age of Onset  . Hypertension Mother   . Diabetes Mother   . Alzheimer's disease Mother   . Hypertension Father   . Diabetes Father   . Hypertension Brother    the patient's father died in his 47s from complications of diabetes. The patient's mother is living, in her mid 48. The patient had 2 brothers, and 2 sisters. There is no history of cancer in the family to her knowledge.  GYNECOLOGIC HISTORY:  Menarche age 47, first live birth age 47. The patient is GX P2. She is postmenopausal, but did have some perimenopausal bleeding, which was evaluated age 3 08/14/2012 with a transabdominal and transvaginal ultrasound of the pelvis which found a normal uterine myometrium and left ovary. The right ovary could not be visualized.  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 She's currently out of work on disability due to her breast cancer and chemotherapy. Her husband  [name] also works for the [employer] system. Their children are Brittney Vance, 8, and Brittney Vance, 5. [name]'s mother currently lives in the home as well. The patient attends a [employer]   ADVANCED DIRECTIVES: Not in place   HEALTH  MAINTENANCE: (Updated 05/21/2013) History  Substance Use Topics  . Smoking status: Never Smoker   . Smokeless tobacco: Never Used  . Alcohol Use: 1.2 oz/week    2 Glasses of wine per week     Comment: occassion     Colonoscopy:- Never  PAP: 2013, Dr. Nicholaus Bloom  Bone density: Never  Lipid panel: Dr. Linford Arnold    No Known Allergies  Current Outpatient Prescriptions  Medication Sig Dispense Refill  . ciprofloxacin (CIPRO) 500 MG tablet Take 1 tablet (500 mg total) by mouth 2 (two) times daily.  14 tablet  3  . dexamethasone (DECADRON) 4 MG tablet Take 2 tablets by mouth starting the evening of chemotherapy and then take 2 tablets two times a day for the next 2 days. Take with food.  30 tablet  4  . hydrochlorothiazide (HYDRODIURIL) 25 MG tablet Take 25 mg by mouth daily after lunch.      . hydrochlorothiazide (HYDRODIURIL) 25 MG tablet TAKE 1 TABLET (25 MG TOTAL) BY MOUTH DAILY.  30 tablet  1  . HYDROcodone-acetaminophen (NORCO/VICODIN) 5-325 MG per tablet TAKE 1 OR 2 TABLETS BY MOUTH EVERY 6 HOURS AS NEEDED FOR PAIN  30 tablet  0  . levothyroxine (SYNTHROID, LEVOTHROID) 88 MCG tablet Take 88 mcg by mouth daily before breakfast.      . lidocaine-prilocaine (EMLA) cream Apply topically as needed. 1-2 hrs before each procedure  30 g  2  . prochlorperazine (COMPAZINE) 10 MG tablet Take one tablet 4 times a day (ac and hs)starting the evening of chemotherapy, and continue an additional 3 days  30 tablet  4   No current facility-administered medications for this visit.    OBJECTIVE: [name] in no acute distress   Filed Vitals:   06/04/13 0954  BP: 124/81  Pulse: 86  Temp: 99.1 F (37.3 C)  Resp: 20     Body mass index is 24.89 kg/(m^2).    ECOG FS:1 Filed Weights   06/04/13 0954  Weight: 136 lb 1.6 oz (61.735 kg)   Physical Exam: HEENT:  Sclerae anicteric.  Oropharynx clear. No ulceration or evidence of candidiasis.  NODES:  No cervical or supraclavicular  lymphadenopathy palpated. BREAST EXAM:  Deferred. Axillae are benign bilaterally palpable lymphadenopathy. LUNGS:  Clear to auscultation bilaterally.  No wheezes or rhonchi HEART:  Regular rate and rhythm. No murmur appreciated. ABDOMEN:  Soft, nontender.  Positive bowel sounds.  MSK:  No focal spinal tenderness to palpation. Full range of motion in the upper extremities. EXTREMITIES:  No peripheral edema.   SKIN:  Port is intact in the right upper chest wall, no erythema or edema and no evidence of infection.  NEURO:  Nonfocal. Well oriented.    Positive affect.     LAB RESULTS:    Lab Results  Component Value Date   WBC 2.4* 06/04/2013   NEUTROABS 1.6 06/04/2013   HGB 11.2* 06/04/2013   HCT 32.4* 06/04/2013   MCV 88.0 06/04/2013   PLT 211 06/04/2013      Chemistry      Component Value Date/Time   NA 140 06/04/2013 0943   NA 136 04/11/2013 0927   K 3.3* 06/04/2013 0943   K 3.9 04/11/2013 1610  CL 103 04/11/2013 0927   CO2 27 06/04/2013 0943   CO2 29 04/11/2013 0927   BUN 13.0 06/04/2013 0943   BUN 10 04/11/2013 0927   CREATININE 0.6 06/04/2013 0943   CREATININE 0.67 04/11/2013 0927   CREATININE 0.65 05/21/2009 2243      Component Value Date/Time   CALCIUM 9.5 06/04/2013 0943   CALCIUM 9.5 04/11/2013 0927   ALKPHOS 94 06/04/2013 0943   ALKPHOS 70 01/19/2013 0910   AST 15 06/04/2013 0943   AST 21 01/19/2013 0910   ALT 15 06/04/2013 0943   ALT 20 01/19/2013 0910   BILITOT 0.29 06/04/2013 0943   BILITOT 0.4 01/19/2013 0910        STUDIES: No results found.     ASSESSMENT: 47 y.o. [place] woman   (1)  status post left breast upper outer quadrant biopsy 04/06/2013 for a clinical T2 N1, stage IIB invasive ductal carcinoma, high-grade, triple negative, with an MIB-1 of 65%  (2)  being treated in the neoadjuvant setting, the goal being to complete 4 dose dense cycles of doxorubicin/ cyclophosphamide, the first of which was given on 04/30/2013. Patient receives  Neulasta on day 2 of each cycle. This will be followed by 12 weekly cycles of carboplatin/ paclitaxel prior to definitive surgery.    PLAN: Aalijah continues to tolerate treatment well and will return next week on November 17 for labs and followup visit in anticipation of her fourth and final dose of doxorubicin/cyclophosphamide.   She isscheduled to see our genetics counselor on November 20. She will have her repeat breast MRI to assess response to neoadjuvant chemotherapy on November 21. She'll see Dr. Darnelle Catalan the following week to review those results, and at that time we'll also discuss her upcoming regimen which will be weekly paclitaxel/carboplatin x12. proceed with her third cycle today as scheduled.  She is also scheduled to meet with her surgeon, Dr. Abbey Chatters, in late November.   This was all reviewed with the patient today who voices understanding and agreement with our plan. She will call with any changes or problems prior to her next appointment.   Delayni Streed, PA-C   06/04/2013 10:19 AM

## 2013-06-04 NOTE — Progress Notes (Unsigned)
Chaplain made visit in lobby. Patient was friendly and appeared much more relaxed and comfortable than during her first chemo session. Chaplain reflected this back to pt, and she said that yes, she's "just here" and "trying to get through it." Pt stated that her children were doing well and that she was trying to get them to the beach for Thanksgiving. 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 Pt also stated that she plans to finish up chemo in February and that it seems far away. Pt invited chaplain to her church. Chaplain practiced reflective listening and provided encouragement and emotional support.

## 2013-06-05 ENCOUNTER — Encounter: Payer: Self-pay | Admitting: *Deleted

## 2013-06-05 NOTE — Progress Notes (Signed)
Mailed after appt letter to pt. 

## 2013-06-11 ENCOUNTER — Ambulatory Visit (HOSPITAL_BASED_OUTPATIENT_CLINIC_OR_DEPARTMENT_OTHER): Payer: BC Managed Care – PPO | Admitting: Physician Assistant

## 2013-06-11 ENCOUNTER — Other Ambulatory Visit: Payer: Self-pay | Admitting: Physician Assistant

## 2013-06-11 ENCOUNTER — Other Ambulatory Visit: Payer: BC Managed Care – PPO | Admitting: Lab

## 2013-06-11 ENCOUNTER — Other Ambulatory Visit (HOSPITAL_BASED_OUTPATIENT_CLINIC_OR_DEPARTMENT_OTHER): Payer: BC Managed Care – PPO | Admitting: Lab

## 2013-06-11 ENCOUNTER — Ambulatory Visit (HOSPITAL_BASED_OUTPATIENT_CLINIC_OR_DEPARTMENT_OTHER): Payer: BC Managed Care – PPO

## 2013-06-11 ENCOUNTER — Encounter: Payer: Self-pay | Admitting: Physician Assistant

## 2013-06-11 VITALS — BP 144/91 | HR 105 | Temp 98.4°F | Resp 18 | Ht 62.0 in | Wt 139.8 lb

## 2013-06-11 DIAGNOSIS — C50412 Malignant neoplasm of upper-outer quadrant of left female breast: Secondary | ICD-10-CM

## 2013-06-11 DIAGNOSIS — D649 Anemia, unspecified: Secondary | ICD-10-CM

## 2013-06-11 DIAGNOSIS — R61 Generalized hyperhidrosis: Secondary | ICD-10-CM

## 2013-06-11 DIAGNOSIS — C50419 Malignant neoplasm of upper-outer quadrant of unspecified female breast: Secondary | ICD-10-CM

## 2013-06-11 DIAGNOSIS — Z171 Estrogen receptor negative status [ER-]: Secondary | ICD-10-CM

## 2013-06-11 DIAGNOSIS — R5381 Other malaise: Secondary | ICD-10-CM

## 2013-06-11 DIAGNOSIS — E876 Hypokalemia: Secondary | ICD-10-CM

## 2013-06-11 DIAGNOSIS — Z5111 Encounter for antineoplastic chemotherapy: Secondary | ICD-10-CM

## 2013-06-11 LAB — CBC WITH DIFFERENTIAL/PLATELET
Basophils Absolute: 0.1 10*3/uL (ref 0.0–0.1)
EOS%: 0.3 % (ref 0.0–7.0)
Eosinophils Absolute: 0 10*3/uL (ref 0.0–0.5)
HGB: 10.8 g/dL — ABNORMAL LOW (ref 11.6–15.9)
MCHC: 34.1 g/dL (ref 31.5–36.0)
MCV: 88.5 fL (ref 79.5–101.0)
MONO#: 1.1 10*3/uL — ABNORMAL HIGH (ref 0.1–0.9)
MONO%: 17.7 % — ABNORMAL HIGH (ref 0.0–14.0)
NEUT#: 4.2 10*3/uL (ref 1.5–6.5)
RDW: 14.6 % — ABNORMAL HIGH (ref 11.2–14.5)
WBC: 6.4 10*3/uL (ref 3.9–10.3)
lymph#: 1 10*3/uL (ref 0.9–3.3)
nRBC: 3 % — ABNORMAL HIGH (ref 0–0)

## 2013-06-11 MED ORDER — PALONOSETRON HCL INJECTION 0.25 MG/5ML
INTRAVENOUS | Status: AC
  Filled 2013-06-11: qty 5

## 2013-06-11 MED ORDER — SODIUM CHLORIDE 0.9 % IV SOLN
150.0000 mg | Freq: Once | INTRAVENOUS | Status: AC
  Administered 2013-06-11: 150 mg via INTRAVENOUS
  Filled 2013-06-11: qty 5

## 2013-06-11 MED ORDER — DEXAMETHASONE SODIUM PHOSPHATE 20 MG/5ML IJ SOLN
12.0000 mg | Freq: Once | INTRAMUSCULAR | Status: AC
  Administered 2013-06-11: 12 mg via INTRAVENOUS

## 2013-06-11 MED ORDER — DOXORUBICIN HCL CHEMO IV INJECTION 2 MG/ML
60.0000 mg/m2 | Freq: Once | INTRAVENOUS | Status: AC
  Administered 2013-06-11: 100 mg via INTRAVENOUS
  Filled 2013-06-11: qty 50

## 2013-06-11 MED ORDER — DEXAMETHASONE SODIUM PHOSPHATE 20 MG/5ML IJ SOLN
INTRAMUSCULAR | Status: AC
  Filled 2013-06-11: qty 5

## 2013-06-11 MED ORDER — SODIUM CHLORIDE 0.9 % IV SOLN
Freq: Once | INTRAVENOUS | Status: AC
  Administered 2013-06-11: 14:00:00 via INTRAVENOUS

## 2013-06-11 MED ORDER — HEPARIN SOD (PORK) LOCK FLUSH 100 UNIT/ML IV SOLN
500.0000 [IU] | Freq: Once | INTRAVENOUS | Status: AC | PRN
  Administered 2013-06-11: 500 [IU]
  Filled 2013-06-11: qty 5

## 2013-06-11 MED ORDER — SODIUM CHLORIDE 0.9 % IV SOLN
600.0000 mg/m2 | Freq: Once | INTRAVENOUS | Status: AC
  Administered 2013-06-11: 1000 mg via INTRAVENOUS
  Filled 2013-06-11: qty 50

## 2013-06-11 MED ORDER — PALONOSETRON HCL INJECTION 0.25 MG/5ML
0.2500 mg | Freq: Once | INTRAVENOUS | Status: AC
  Administered 2013-06-11: 0.25 mg via INTRAVENOUS

## 2013-06-11 MED ORDER — SODIUM CHLORIDE 0.9 % IJ SOLN
10.0000 mL | INTRAMUSCULAR | Status: DC | PRN
  Administered 2013-06-11: 10 mL
  Filled 2013-06-11: qty 10

## 2013-06-11 NOTE — Progress Notes (Signed)
[name] OB: [birth year]  MR#: [id number]  WUJ#:[id number]  PCP: Nani Gasser, MD GYN:  Grant Ruts SU: Avel Peace OTHER MD: Hal Neer  CHIEF COMPLAINT:  Left Breast Cancer, neoadjuvant chemotherapy  HISTORY OF PRESENT ILLNESS: Brittney Vance had routine screening mammography 03/09/2013 showing heterogeneously dense breasts, and a possible asymmetry in the left breast. Diagnostic left mammography and ultrasonography at the breast Center 03/30/2013 found a lobulated mass in the left breast upper outer quadrant measuring 1.6 cm. This was palpable. Ultrasound showed a complex microlobulated hypoechoic mass measuring 1.8 cm. There was no left axillary lymphadenopathy noted.  Biopsy of the mass in question 04/06/2013 showed ([id number]) and invasive ductal carcinoma, grade 3, triple negative, with an MIB-1 of 65%.  Bilateral breast MRI 04/13/2013 found an irregular mass measuring 2.5 cm in the left breast with a few lymph nodes that showed moderate cortical thickness, the largest measuring 12 mm (level I).  The patient's subsequent history is as detailed below  INTERVAL HISTORY: Brittney Vance returns alone today for followup of her locally advanced left breast carcinoma. She is currently day 1 cycle 4 of 4 planned dose-dense cycles of doxorubicin/ cyclophosphamide being given in the neoadjuvant setting. She receives Neulasta on day 2 for granulocyte support.  Brittney Vance continues to tolerate treatment well, and is ready to get this fourth and final dose of AC behind her today.  She has no new complaints today, her only continuing complaints being some occasional hot flashes and continued fatigue.  She plans on taking her children to [facility] next week for Thanksgiving, and is trying to make some plans for Christmas as well.  REVIEW OF SYSTEMS: Brittney Vance has had no fevers, chills, night sweats, or rashes, and denies any signs of abnormal bruising or bleeding. Her appetite is usually  decreased with some taste aversion for the first 3 days following treatment. However, she  denies any nausea, emesis, or change in bowel or bladder habits. She denies any cough, phlegm production, shortness of breath, chest pain, or palpitations. She sometimes has some tenderness around her port area, but has had no redness or swelling. She's had no abnormal headaches, dizziness, or change in vision, and also denies any myalgias, arthralgias, or bony pain. She's had no peripheral swelling.   A detailed review of systems is otherwise stable and noncontributory.   PAST MEDICAL HISTORY: Past Medical History  Diagnosis Date  . Fibroids   . Hypertension   . Hypothyroid   . Breast cancer     PAST SURGICAL HISTORY: Past Surgical History  Procedure Laterality Date  . Tubal ligation    . Portacath placement Right 04/27/2013    Procedure: ULTRASOUND GUIDED PORT INSERTION WITH FLUOROSCOPY;  Surgeon: Adolph Pollack, MD;  Location: [facility];  Service: General;  Laterality: Right;    FAMILY HISTORY Family History  Problem Relation Age of Onset  . Hypertension Mother   . Diabetes Mother   . Alzheimer's disease Mother   . Hypertension Father   . Diabetes Father   . Hypertension Brother    the patient's father died in his 23s from complications of diabetes. The patient's mother is living, in her mid 29s. The patient had 2 brothers, and 2 sisters. There is no history of cancer in the family to her knowledge.  GYNECOLOGIC HISTORY:  Menarche age 14, first live birth age 70. The patient is GX P2. She is postmenopausal, but did have some perimenopausal bleeding, which was evaluated age 26 08/14/2012 with a transabdominal and  transvaginal ultrasound of the pelvis which found a normal uterine myometrium and left ovary. The right ovary could not be visualized.  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 Her husband [name] also works for the [employer] system. Their children are Brittney Vance, 8, and Brittney Vance, 5. Brittney Vance's mother currently lives in the home as well. The patient attends a [employer]   ADVANCED DIRECTIVES: Not in place   HEALTH MAINTENANCE: (Updated 05/21/2013) History  Substance Use Topics  . Smoking status: Never Smoker   . Smokeless tobacco: Never Used  . Alcohol Use: 1.2 oz/week    2 Glasses of wine per week     Comment: occassion     Colonoscopy:- Never  PAP: 2013, Dr. Nicholaus Bloom  Bone density: Never  Lipid panel: Dr. Linford Arnold    No Known Allergies  Current Outpatient Prescriptions  Medication Sig Dispense Refill  . ciprofloxacin (CIPRO) 500 MG tablet Take 1 tablet (500 mg total) by mouth 2 (two) times daily.  14 tablet  3  . dexamethasone (DECADRON) 4 MG tablet Take 2 tablets by mouth starting the evening of chemotherapy and then take 2 tablets two times a day for the next 2 days. Take with food.  30 tablet  4  . hydrochlorothiazide (HYDRODIURIL) 25 MG tablet Take 25 mg by mouth daily after lunch.      Marland Kitchen HYDROcodone-acetaminophen (NORCO/VICODIN) 5-325 MG per tablet TAKE 1 OR 2 TABLETS BY MOUTH EVERY 6 HOURS AS NEEDED FOR PAIN  30 tablet  0  . levothyroxine (SYNTHROID, LEVOTHROID) 88 MCG tablet Take 88 mcg by mouth daily before breakfast.      . lidocaine-prilocaine (EMLA) cream Apply topically as needed. 1-2 hrs before each procedure  30 g  2  . prochlorperazine (COMPAZINE) 10 MG tablet Take one tablet 4 times a day (ac and hs)starting the evening of chemotherapy, and continue an additional 3 days  30 tablet  4   No current facility-administered medications for this visit.    OBJECTIVE: [name] in no acute distress   Filed Vitals:   06/11/13 1124  BP: 144/91  Pulse: 105  Temp: 98.4 F (36.9 C)  Resp: 18     Body mass index is 25.56 kg/(m^2).    ECOG FS:1 Filed Weights    06/11/13 1124  Weight: 139 lb 12.8 oz (63.413 kg)   Physical Exam: HEENT:  Sclerae anicteric.  Oropharynx clear. No ulceration or evidence of candidiasis. Buccal mucosa is pink and moist NODES:  No cervical or supraclavicular lymphadenopathy palpated. BREAST EXAM:  Right breast is unremarkable. Palpable thickening in the lateral portion of the left breast, measuring between 1 and 1.5 cm in diameter. Axillae are benign bilaterally palpable lymphadenopathy. LUNGS:  Clear to auscultation bilaterally.  No wheezes or rhonchi HEART:  Regular rate and rhythm.  ABDOMEN:  Soft, nontender to palpation.  Positive bowel sounds.  MSK:  No focal spinal tenderness to palpation. Full range of motion in the upper extremities. EXTREMITIES:  No peripheral edema.   SKIN:  Hyperpigmentation noted bilaterally on the skin of the hands, as well as the nailbeds bilaterally. There is no drainage from the nails, and no evidence of infection.  Port is intact in the right upper chest wall, no erythema or edema and no evidence of infection.  NEURO:  Nonfocal. Well oriented.  Positive  affect.     LAB RESULTS:    Lab Results  Component Value Date   WBC 6.4 06/11/2013   NEUTROABS 4.2 06/11/2013   HGB 10.8* 06/11/2013   HCT 31.7* 06/11/2013   MCV 88.5 06/11/2013   PLT 223 06/11/2013      Chemistry      Component Value Date/Time   NA 140 06/04/2013 0943   NA 136 04/11/2013 0927   K 3.3* 06/04/2013 0943   K 3.9 04/11/2013 0927   CL 103 04/11/2013 0927   CO2 27 06/04/2013 0943   CO2 29 04/11/2013 0927   BUN 13.0 06/04/2013 0943   BUN 10 04/11/2013 0927   CREATININE 0.6 06/04/2013 0943   CREATININE 0.67 04/11/2013 0927   CREATININE 0.65 05/21/2009 2243      Component Value Date/Time   CALCIUM 9.5 06/04/2013 0943   CALCIUM 9.5 04/11/2013 0927   ALKPHOS 94 06/04/2013 0943   ALKPHOS 70 01/19/2013 0910   AST 15 06/04/2013 0943   AST 21 01/19/2013 0910   ALT 15 06/04/2013 0943   ALT 20 01/19/2013 0910   BILITOT  0.29 06/04/2013 0943   BILITOT 0.4 01/19/2013 0910        STUDIES: No results found.     ASSESSMENT: 47 y.o. [place] woman   (1)  status post left breast upper outer quadrant biopsy 04/06/2013 for a clinical T2 N1, stage IIB invasive ductal carcinoma, high-grade, triple negative, with an MIB-1 of 65%  (2)  being treated in the neoadjuvant setting, the goal being to complete 4 dose dense cycles of doxorubicin/ cyclophosphamide, the first of which was given on 04/30/2013. Patient receives Neulasta on day 2 of each cycle. This will be followed by 12 weekly cycles of carboplatin/ paclitaxel prior to definitive surgery.    PLAN: Darling continues to tolerate treatment well and will proceed to treatment today as scheduled for her fourth and final neoadjuvant dose of doxorubicin/cyclophosphamide. She scheduled forreturn next week on November 17 for labs and folthe genetic counselor later this week on November 20. She will then have her breast MRI on Friday, November 21, and will see both Dr. Darnelle Catalan and Dr. Kae Heller her next week to review those results and discuss her surgery.   I will see Ellarae when she returns in 2 weeks on December 1 in anticipation of her first of 12 planned weekly doses of carboplatin/paclitaxel. We have briefly reviewed possible side effects and toxicities, but this will be reviewed more thoroughly when she sees Dr. Darnelle Catalan next week.  I will mention that Ciena has had some mild hypokalemia, and we will continue to follow this closely.  A metabolic panel will be drawn today.  This was all reviewed with the patient today who voices understanding and agreement with our plan. She will call with any changes or problems prior to her next appointment.   Simmone Cape, PA-C   06/11/2013 12:24 PM

## 2013-06-11 NOTE — Patient Instructions (Signed)
Cancer Center Discharge Instructions for Patients Receiving Chemotherapy  Today you received the following chemotherapy agents Adriamycin and Cytoxan.  To help prevent nausea and vomiting after your treatment, we encourage you to take your nausea medication as prescribed.   If you develop nausea and vomiting that is not controlled by your nausea medication, call the clinic.   BELOW ARE SYMPTOMS THAT SHOULD BE REPORTED IMMEDIATELY:  *FEVER GREATER THAN 100.5 F  *CHILLS WITH OR WITHOUT FEVER  NAUSEA AND VOMITING THAT IS NOT CONTROLLED WITH YOUR NAUSEA MEDICATION  *UNUSUAL SHORTNESS OF BREATH  *UNUSUAL BRUISING OR BLEEDING  TENDERNESS IN MOUTH AND THROAT WITH OR WITHOUT PRESENCE OF ULCERS  *URINARY PROBLEMS  *BOWEL PROBLEMS  UNUSUAL RASH Items with * indicate a potential emergency and should be followed up as soon as possible.  Feel free to call the clinic you have any questions or concerns. The clinic phone number is [phone].    

## 2013-06-12 ENCOUNTER — Ambulatory Visit (HOSPITAL_BASED_OUTPATIENT_CLINIC_OR_DEPARTMENT_OTHER): Payer: BC Managed Care – PPO

## 2013-06-12 VITALS — BP 146/92 | HR 110 | Temp 98.0°F

## 2013-06-12 DIAGNOSIS — C50412 Malignant neoplasm of upper-outer quadrant of left female breast: Secondary | ICD-10-CM

## 2013-06-12 DIAGNOSIS — C50419 Malignant neoplasm of upper-outer quadrant of unspecified female breast: Secondary | ICD-10-CM

## 2013-06-12 DIAGNOSIS — Z5189 Encounter for other specified aftercare: Secondary | ICD-10-CM

## 2013-06-12 MED ORDER — PEGFILGRASTIM INJECTION 6 MG/0.6ML
6.0000 mg | Freq: Once | SUBCUTANEOUS | Status: AC
  Administered 2013-06-12: 6 mg via SUBCUTANEOUS
  Filled 2013-06-12: qty 0.6

## 2013-06-12 NOTE — Patient Instructions (Signed)

## 2013-06-14 ENCOUNTER — Encounter: Payer: BC Managed Care – PPO | Admitting: Genetic Counselor

## 2013-06-14 ENCOUNTER — Other Ambulatory Visit: Payer: BC Managed Care – PPO | Admitting: Lab

## 2013-06-15 ENCOUNTER — Ambulatory Visit (HOSPITAL_COMMUNITY)
Admission: RE | Admit: 2013-06-15 | Discharge: 2013-06-15 | Disposition: A | Discharge status: Home / Self Care | Payer: BC Managed Care – PPO | Source: Ambulatory Visit | Attending: Physician Assistant | Admitting: Physician Assistant

## 2013-06-15 ENCOUNTER — Telehealth: Payer: Self-pay | Admitting: *Deleted

## 2013-06-15 DIAGNOSIS — Z79899 Other long term (current) drug therapy: Secondary | ICD-10-CM | POA: Insufficient documentation

## 2013-06-15 DIAGNOSIS — C50419 Malignant neoplasm of upper-outer quadrant of unspecified female breast: Secondary | ICD-10-CM | POA: Insufficient documentation

## 2013-06-15 DIAGNOSIS — C50412 Malignant neoplasm of upper-outer quadrant of left female breast: Secondary | ICD-10-CM

## 2013-06-15 MED ORDER — GADOBENATE DIMEGLUMINE 529 MG/ML IV SOLN
15.0000 mL | Freq: Once | INTRAVENOUS | Status: AC | PRN
  Administered 2013-06-15: 13 mL via INTRAVENOUS

## 2013-06-15 NOTE — Telephone Encounter (Signed)
This RN was asked by PA to call pt today with results of MRI as soon as available.  This RN obtained results at 130pm - per demographics no number listed to contacted pt.  This RN reviewed contact information and noted husband listed as emergency contact with number stated as " home " number.  This RN called and obtained automated VM stating number called but without name of person./  This RN left a general message requesting a return call from pt to this RN.

## 2013-06-18 ENCOUNTER — Ambulatory Visit (HOSPITAL_BASED_OUTPATIENT_CLINIC_OR_DEPARTMENT_OTHER): Payer: BC Managed Care – PPO | Admitting: Oncology

## 2013-06-18 ENCOUNTER — Other Ambulatory Visit (HOSPITAL_BASED_OUTPATIENT_CLINIC_OR_DEPARTMENT_OTHER): Payer: BC Managed Care – PPO | Admitting: Lab

## 2013-06-18 ENCOUNTER — Telehealth: Payer: Self-pay | Admitting: *Deleted

## 2013-06-18 VITALS — BP 129/85 | HR 109 | Temp 98.6°F | Resp 19 | Ht 62.0 in | Wt 136.5 lb

## 2013-06-18 DIAGNOSIS — C50419 Malignant neoplasm of upper-outer quadrant of unspecified female breast: Secondary | ICD-10-CM

## 2013-06-18 DIAGNOSIS — C50412 Malignant neoplasm of upper-outer quadrant of left female breast: Secondary | ICD-10-CM

## 2013-06-18 DIAGNOSIS — Z171 Estrogen receptor negative status [ER-]: Secondary | ICD-10-CM

## 2013-06-18 DIAGNOSIS — D649 Anemia, unspecified: Secondary | ICD-10-CM

## 2013-06-18 LAB — CBC WITH DIFFERENTIAL/PLATELET
BASO%: 0.9 % (ref 0.0–2.0)
Basophils Absolute: 0 10*3/uL (ref 0.0–0.1)
EOS%: 0.5 % (ref 0.0–7.0)
HCT: 29.9 % — ABNORMAL LOW (ref 34.8–46.6)
MCH: 30.2 pg (ref 25.1–34.0)
MCHC: 34.4 g/dL (ref 31.5–36.0)
MONO%: 7.9 % (ref 0.0–14.0)
NEUT%: 60.6 % (ref 38.4–76.8)
WBC: 2.2 10*3/uL — ABNORMAL LOW (ref 3.9–10.3)
lymph#: 0.7 10*3/uL — ABNORMAL LOW (ref 0.9–3.3)

## 2013-06-18 NOTE — Telephone Encounter (Signed)
This RN returned call to pt post message from her requesting results of scan done 11/21.  Return call number given as [phone].  Informed pt of response to therapy per scan with areas smaller then previous.

## 2013-06-18 NOTE — Progress Notes (Signed)
IDGunnar Fusi Vance OB: [birth year]  MR#: [id number]  WUJ#:[id number]  PCP: Nani Gasser, MD GYN:  Grant Ruts SU: Avel Peace OTHER MD: Hal Neer  CHIEF COMPLAINT:  Left Breast Cancer, neoadjuvant chemotherapy  HISTORY OF PRESENT ILLNESS: Brittney Vance had routine screening mammography 03/09/2013 showing heterogeneously dense breasts, and a possible asymmetry in the left breast. Diagnostic left mammography and ultrasonography at the breast Center 03/30/2013 found a lobulated mass in the left breast upper outer quadrant measuring 1.6 cm. This was palpable. Ultrasound showed a complex microlobulated hypoechoic mass measuring 1.8 cm. There was no left axillary lymphadenopathy noted.  Biopsy of the mass in question 04/06/2013 showed ([id number]) and invasive ductal carcinoma, grade 3, triple negative, with an MIB-1 of 65%.  Bilateral breast MRI 04/13/2013 found an irregular mass measuring 2.5 cm in the left breast with a few lymph nodes that showed moderate cortical thickness, the largest measuring 12 mm (level I).  The patient's subsequent history is as detailed below  INTERVAL HISTORY: Roberto returns today for followup of her breast cancer. She is being treated neoadjuvant E. and completed 4 cycles of doxorubicin and cyclophosphamide on 06/11/2013. Today is day 8 from cycle 4. She will be starting her weekly carboplatin and paclitaxel treatments next week. Today also we discussed her restaging breast MRI, which shows a good initial response  REVIEW OF SYSTEMS: Brittney Vance had some hot flashes and some fatigue as her chief side effects from chemotherapy so far. The fatigue would generally lasts 3-5 days. She lost her appetite and her taste has changed, but she has maintained her weight. She's had no problems with nausea or vomiting. She has mild insomnia but this is no different from prior. She keeps a runny nose, and has a little bit of back pain which is not more constant or intense than before.  Otherwise a detailed review of systems today was noncontributory   PAST MEDICAL HISTORY: Past Medical History  Diagnosis Date  . Fibroids   . Hypertension   . Hypothyroid   . Breast cancer     PAST SURGICAL HISTORY: Past Surgical History  Procedure Laterality Date  . Tubal ligation    . Portacath placement Right 04/27/2013    Procedure: ULTRASOUND GUIDED PORT INSERTION WITH FLUOROSCOPY;  Surgeon: Adolph Pollack, MD;  Location: [facility];  Service: General;  Laterality: Right;    FAMILY HISTORY Family History  Problem Relation Age of Onset  . Hypertension Mother   . Diabetes Mother   . Alzheimer's disease Mother   . Hypertension Father   . Diabetes Father   . Hypertension Brother    the patient's father died in his 42s from complications of diabetes. The patient's mother is living, in her mid 70s. The patient had 2 brothers, and 2 sisters. There is no history of cancer in the family to her knowledge.  GYNECOLOGIC HISTORY:  Menarche age 47, first live birth age 47. The patient is GX P2. She is postmenopausal, but did have some perimenopausal bleeding, which was evaluated age 47 08/14/2012 with a transabdominal and transvaginal ultrasound of the pelvis which found a normal uterine myometrium and left ovary. The right ovary could not be visualized.  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 She's currently out of work on disability due to her breast cancer and chemotherapy. Her husband [name] also works for the [employer] system. Their children are [name], 8, and [place], 5. Calvary's mother currently lives in the  home as well. The patient attends a [employer]   ADVANCED DIRECTIVES: Not in place   HEALTH MAINTENANCE: (Updated 05/21/2013) History  Substance Use Topics  . Smoking status: Never Smoker   . Smokeless tobacco: Never Used  . Alcohol Use: 1.2 oz/week    2 Glasses of wine per week      Comment: occassion     Colonoscopy:- Never  PAP: 2013, Dr. Nicholaus Bloom  Bone density: Never  Lipid panel: Dr. Linford Arnold    No Known Allergies  Current Outpatient Prescriptions  Medication Sig Dispense Refill  . ciprofloxacin (CIPRO) 500 MG tablet Take 1 tablet (500 mg total) by mouth 2 (two) times daily.  14 tablet  3  . dexamethasone (DECADRON) 4 MG tablet Take 2 tablets by mouth starting the evening of chemotherapy and then take 2 tablets two times a day for the next 2 days. Take with food.  30 tablet  4  . hydrochlorothiazide (HYDRODIURIL) 25 MG tablet Take 25 mg by mouth daily after lunch.      Marland Kitchen HYDROcodone-acetaminophen (NORCO/VICODIN) 5-325 MG per tablet TAKE 1 OR 2 TABLETS BY MOUTH EVERY 6 HOURS AS NEEDED FOR PAIN  30 tablet  0  . levothyroxine (SYNTHROID, LEVOTHROID) 88 MCG tablet Take 88 mcg by mouth daily before breakfast.      . lidocaine-prilocaine (EMLA) cream Apply topically as needed. 1-2 hrs before each procedure  30 g  2  . prochlorperazine (COMPAZINE) 10 MG tablet Take one tablet 4 times a day (ac and hs)starting the evening of chemotherapy, and continue an additional 3 days  30 tablet  4   No current facility-administered medications for this visit.    OBJECTIVE: [name] in no acute distress   Filed Vitals:   06/18/13 1538  BP: 129/85  Pulse: 109  Temp: 98.6 F (37 C)  Resp: 19     Body mass index is 24.96 kg/(m^2).    ECOG FS:0 Filed Weights   06/18/13 1538  Weight: 136 lb 8 oz (61.916 kg)   Sclerae unicteric, pupils equal and reactive Oropharynx clear and moist-- no thrush No cervical or supraclavicular adenopathy Lungs no rales or rhonchi Heart regular rate and rhythm Abd soft, nontender, positive bowel sounds MSK no focal spinal tenderness, no upper extremity lymphedema Neuro: nonfocal, well oriented, appropriate affect Breasts: The right breast is unremarkable. I do not palpate any masses in the left breast. There are no skin  or nipple changes of concern. The left axilla is benign.   LAB RESULTS:  Lab Results  Component Value Date   WBC 2.2* 06/18/2013   NEUTROABS 1.3* 06/18/2013   HGB 10.3* 06/18/2013   HCT 29.9* 06/18/2013   MCV 87.7 06/18/2013   PLT 259 06/18/2013      Chemistry      Component Value Date/Time   NA 140 06/04/2013 0943   NA 136 04/11/2013 0927   K 3.3* 06/04/2013 0943   K 3.9 04/11/2013 0927   CL 103 04/11/2013 0927   CO2 27 06/04/2013 0943   CO2 29 04/11/2013 0927   BUN 13.0 06/04/2013 0943   BUN 10 04/11/2013 0927   CREATININE 0.6 06/04/2013 0943   CREATININE 0.67 04/11/2013 0927   CREATININE 0.65 05/21/2009 2243      Component Value Date/Time   CALCIUM 9.5 06/04/2013 0943   CALCIUM 9.5 04/11/2013 0927   ALKPHOS 94 06/04/2013 0943   ALKPHOS 70 01/19/2013 0910   AST 15 06/04/2013 0943  AST 21 01/19/2013 0910   ALT 15 06/04/2013 0943   ALT 20 01/19/2013 0910   BILITOT 0.29 06/04/2013 0943   BILITOT 0.4 01/19/2013 0910        STUDIES: Mr Breast Bilateral W Wo Contrast  06/15/2013   CLINICAL DATA:  Biopsy-proven upper-outer quadrant left breast cancer. Question response to neoadjuvant chemotherapy.  EXAM: MR BILATERAL BREAST WITHOUT AND WITH CONTRAST  LABS:  Not applicable  TECHNIQUE: Multiplanar, multisequence MR images of both breasts were obtained prior to and following the intravenous administration of 12ml of MultiHance.  THREE-DIMENSIONAL MR IMAGE RENDERING ON INDEPENDENT WORKSTATION:  Three-dimensional MR images were rendered by post-processing of the original MR data on an independent workstation. The three-dimensional MR images were interpreted, and findings are reported in the following complete MRI report for this study.  COMPARISON:  Previous exams  FINDINGS: Breast composition: c:  Heterogeneous fibroglandular tissue  Background parenchymal enhancement: Moderate  Right breast: No mass or abnormal enhancement.  Left breast: Interval decrease in size of irregular enhancing  mass in the posterior upper outer left breast. The mass measures 1.7 x 0.9 x 1.4 cm compared with 2.5 x 1.9 x 2 cm previously. No additional areas of abnormal enhancement.  Lymph nodes: No abnormal appearing lymph nodes.  Ancillary findings:  None.  IMPRESSION: Interval positive response to therapy, with biopsy proven left breast carcinoma measuring 1.7 x 0.9 x 1.4 cm compared with 2.5 x 1.9 x 2 cm previously.  RECOMMENDATION: Treatment planning.  BI-RADS CATEGORY  6: Known biopsy-proven malignancy - appropriate action should be taken.   Electronically Signed   By: Jerene Dilling M.D.   On: 06/15/2013 13:42     ASSESSMENT: 47 y.o. [place] woman   (1)  status post left breast upper outer quadrant biopsy 04/06/2013 for a clinical T2 N1, stage IIB invasive ductal carcinoma, high-grade, triple negative, with an MIB-1 of 65%  (2)  being treated in the neoadjuvant setting,   (a) completed 4 dose dense cycles of doxorubicin/ cyclophosphamide 06/11/2013,.  (b)  0n 06/25/2013 starting 12 weekly cycles of carboplatin/ paclitaxel, to be followed by definitive surgery   PLAN: Hollis did remarkably well with the first half of her chemotherapy, and the MRI shows a very good initial response. She is now ready to start the weekly treatments. We discussed the possible toxicities, side effects and complications of carboplatin and paclitaxel and particularly alerted her to the risk of peripheral neuropathy, since that particular symptom may be permanent. She was also urged to take her anti-emetics exactly as prescribed for the first 2 cycles. He she does very well with those she may be able to "trim then down" some, as she did with the first portion of chemotherapy.  Incidentally she brought her loss and a Environmental manager and report are from a local [employer] paperwork today with her. They're doing a story on her experience.  Arionna knows to call for any problems that may develop before her next visit  here   Lowella Dell, MD   06/18/2013 4:07 PM

## 2013-06-19 ENCOUNTER — Encounter (INDEPENDENT_AMBULATORY_CARE_PROVIDER_SITE_OTHER): Payer: BC Managed Care – PPO | Admitting: General Surgery

## 2013-06-25 ENCOUNTER — Other Ambulatory Visit (HOSPITAL_BASED_OUTPATIENT_CLINIC_OR_DEPARTMENT_OTHER): Payer: BC Managed Care – PPO | Admitting: Lab

## 2013-06-25 ENCOUNTER — Telehealth: Payer: Self-pay | Admitting: Oncology

## 2013-06-25 ENCOUNTER — Telehealth: Payer: Self-pay | Admitting: *Deleted

## 2013-06-25 ENCOUNTER — Encounter: Payer: Self-pay | Admitting: Physician Assistant

## 2013-06-25 ENCOUNTER — Ambulatory Visit (HOSPITAL_BASED_OUTPATIENT_CLINIC_OR_DEPARTMENT_OTHER): Payer: BC Managed Care – PPO | Admitting: Physician Assistant

## 2013-06-25 ENCOUNTER — Ambulatory Visit (HOSPITAL_BASED_OUTPATIENT_CLINIC_OR_DEPARTMENT_OTHER): Payer: BC Managed Care – PPO

## 2013-06-25 VITALS — BP 148/89 | HR 94 | Temp 98.9°F | Resp 18

## 2013-06-25 VITALS — BP 121/83 | HR 102 | Temp 98.5°F | Resp 18 | Ht 62.0 in | Wt 138.8 lb

## 2013-06-25 DIAGNOSIS — C50419 Malignant neoplasm of upper-outer quadrant of unspecified female breast: Secondary | ICD-10-CM

## 2013-06-25 DIAGNOSIS — D649 Anemia, unspecified: Secondary | ICD-10-CM

## 2013-06-25 DIAGNOSIS — C50412 Malignant neoplasm of upper-outer quadrant of left female breast: Secondary | ICD-10-CM

## 2013-06-25 DIAGNOSIS — R209 Unspecified disturbances of skin sensation: Secondary | ICD-10-CM

## 2013-06-25 DIAGNOSIS — Z171 Estrogen receptor negative status [ER-]: Secondary | ICD-10-CM

## 2013-06-25 DIAGNOSIS — Z5111 Encounter for antineoplastic chemotherapy: Secondary | ICD-10-CM

## 2013-06-25 LAB — COMPREHENSIVE METABOLIC PANEL (CC13)
ALT: 18 U/L (ref 0–55)
AST: 18 U/L (ref 5–34)
Albumin: 3.8 g/dL (ref 3.5–5.0)
Alkaline Phosphatase: 78 U/L (ref 40–150)
CO2: 26 mEq/L (ref 22–29)
Creatinine: 0.6 mg/dL (ref 0.6–1.1)
Potassium: 3.7 mEq/L (ref 3.5–5.1)
Sodium: 143 mEq/L (ref 136–145)
Total Bilirubin: <0.2 mg/dL (ref 0.20–1.20)
Total Protein: 6.7 g/dL (ref 6.4–8.3)

## 2013-06-25 LAB — CBC WITH DIFFERENTIAL/PLATELET
BASO%: 0.9 % (ref 0.0–2.0)
Basophils Absolute: 0.1 10*3/uL (ref 0.0–0.1)
EOS%: 0.4 % (ref 0.0–7.0)
Eosinophils Absolute: 0 10*3/uL (ref 0.0–0.5)
HGB: 9.9 g/dL — ABNORMAL LOW (ref 11.6–15.9)
LYMPH%: 12.8 % — ABNORMAL LOW (ref 14.0–49.7)
MCH: 30 pg (ref 25.1–34.0)
MCHC: 33.6 g/dL (ref 31.5–36.0)
MCV: 89.4 fL (ref 79.5–101.0)
Platelets: 202 10*3/uL (ref 145–400)
RBC: 3.3 10*6/uL — ABNORMAL LOW (ref 3.70–5.45)
RDW: 15.8 % — ABNORMAL HIGH (ref 11.2–14.5)
lymph#: 0.9 10*3/uL (ref 0.9–3.3)
nRBC: 3 % — ABNORMAL HIGH (ref 0–0)

## 2013-06-25 MED ORDER — SODIUM CHLORIDE 0.9 % IV SOLN
278.0000 mg | Freq: Once | INTRAVENOUS | Status: AC
  Administered 2013-06-25: 280 mg via INTRAVENOUS
  Filled 2013-06-25: qty 28

## 2013-06-25 MED ORDER — DIPHENHYDRAMINE HCL 50 MG/ML IJ SOLN
50.0000 mg | Freq: Once | INTRAMUSCULAR | Status: AC
  Administered 2013-06-25: 50 mg via INTRAVENOUS

## 2013-06-25 MED ORDER — ONDANSETRON 16 MG/50ML IVPB (CHCC)
16.0000 mg | Freq: Once | INTRAVENOUS | Status: AC
  Administered 2013-06-25: 16 mg via INTRAVENOUS

## 2013-06-25 MED ORDER — PACLITAXEL CHEMO INJECTION 300 MG/50ML
80.0000 mg/m2 | Freq: Once | INTRAVENOUS | Status: AC
  Administered 2013-06-25: 132 mg via INTRAVENOUS
  Filled 2013-06-25: qty 22

## 2013-06-25 MED ORDER — SODIUM CHLORIDE 0.9 % IV SOLN
Freq: Once | INTRAVENOUS | Status: AC
  Administered 2013-06-25: 12:00:00 via INTRAVENOUS

## 2013-06-25 MED ORDER — HEPARIN SOD (PORK) LOCK FLUSH 100 UNIT/ML IV SOLN
500.0000 [IU] | Freq: Once | INTRAVENOUS | Status: AC | PRN
  Administered 2013-06-25: 500 [IU]
  Filled 2013-06-25: qty 5

## 2013-06-25 MED ORDER — DEXAMETHASONE SODIUM PHOSPHATE 20 MG/5ML IJ SOLN
20.0000 mg | Freq: Once | INTRAMUSCULAR | Status: AC
  Administered 2013-06-25: 20 mg via INTRAVENOUS

## 2013-06-25 MED ORDER — FAMOTIDINE IN NACL 20-0.9 MG/50ML-% IV SOLN
20.0000 mg | Freq: Once | INTRAVENOUS | Status: AC
  Administered 2013-06-25: 20 mg via INTRAVENOUS

## 2013-06-25 MED ORDER — DIPHENHYDRAMINE HCL 50 MG/ML IJ SOLN
INTRAMUSCULAR | Status: AC
  Filled 2013-06-25: qty 1

## 2013-06-25 MED ORDER — ONDANSETRON 16 MG/50ML IVPB (CHCC)
INTRAVENOUS | Status: AC
  Filled 2013-06-25: qty 16

## 2013-06-25 MED ORDER — SODIUM CHLORIDE 0.9 % IJ SOLN
10.0000 mL | INTRAMUSCULAR | Status: DC | PRN
  Administered 2013-06-25: 10 mL
  Filled 2013-06-25: qty 10

## 2013-06-25 MED ORDER — DEXAMETHASONE SODIUM PHOSPHATE 20 MG/5ML IJ SOLN
INTRAMUSCULAR | Status: AC
  Filled 2013-06-25: qty 5

## 2013-06-25 MED ORDER — FAMOTIDINE IN NACL 20-0.9 MG/50ML-% IV SOLN
INTRAVENOUS | Status: AC
  Filled 2013-06-25: qty 50

## 2013-06-25 NOTE — Telephone Encounter (Signed)
, °

## 2013-06-25 NOTE — Progress Notes (Addendum)
[name] OB: Jun 16, 1966  MR#: [id number]  WUJ#:[id number]  PCP: Nani Gasser, MD GYN:  Grant Ruts SU: Avel Peace OTHER MD: Hal Neer  CHIEF COMPLAINT:  Left Breast Cancer, neoadjuvant chemotherapy  HISTORY OF PRESENT ILLNESS: Brittney Vance had routine screening mammography 03/09/2013 showing heterogeneously dense breasts, and a possible asymmetry in the left breast. Diagnostic left mammography and ultrasonography at the breast Center 03/30/2013 found a lobulated mass in the left breast upper outer quadrant measuring 1.6 cm. This was palpable. Ultrasound showed a complex microlobulated hypoechoic mass measuring 1.8 cm. There was no left axillary lymphadenopathy noted.  Biopsy of the mass in question 04/06/2013 showed ([id number]) and invasive ductal carcinoma, grade 3, triple negative, with an MIB-1 of 65%.  Bilateral breast MRI 04/13/2013 found an irregular mass measuring 2.5 cm in the left breast with a few lymph nodes that showed moderate cortical thickness, the largest measuring 12 mm (level I).  The patient's subsequent history is as detailed below  INTERVAL HISTORY: Brittney Vance returns today accompanied by her husband, Brittney Vance, for followup of her breast cancer. She is being treated neoadjuvantly.  She is now ready to receive her first of 12 planned weekly doses of carboplatin/paclitaxel today. Dr. Darnelle Catalan reviewed this regimen with her when she saw him last week, including her antinausea regimen.  Overall, Brittney Vance is feeling well, but she is very anxious about starting any treatment today. She does mention that she graduate next week from [employer] with her Masters in [place].  She is very busy lately, and is still trying to "do everything". She has her husband here today to see Dr. Darnelle Catalan because she tells me he "just doesn't understand" the changes since her breast cancer diagnosis.   REVIEW OF SYSTEMS: Physically, Brittney Vance is doing well. She's had no fevers, chills, or night  sweats. She denies any rashes and has had no abnormal bleeding.  She does note that her skin is drier than usual. Interestingly enough, she has noticed some intermittent numbness in her fingertips this past week, although she has not yet started treatment with paclitaxel. She has never noticed this in the past.  Fortunately, it is not affecting any of her fine motor skills or any of her day-to-day activities.   She has had some taste aversion, but denies any nausea or emesis. She's had no change in bowel or bladder habits. She denies any cough, increased shortness of breath, chest pain, palpitations. She's had no abnormal headaches or dizziness, and denies any unusual myalgias, arthralgias, bony pain, or peripheral swelling.  A detailed review of systems is otherwise stable and noncontributory.   PAST MEDICAL HISTORY: Past Medical History  Diagnosis Date  . Fibroids   . Hypertension   . Hypothyroid   . Breast cancer     PAST SURGICAL HISTORY: Past Surgical History  Procedure Laterality Date  . Tubal ligation    . Portacath placement Right 04/27/2013    Procedure: ULTRASOUND GUIDED PORT INSERTION WITH FLUOROSCOPY;  Surgeon: Adolph Pollack, MD;  Location: [facility];  Service: General;  Laterality: Right;    FAMILY HISTORY Family History  Problem Relation Age of Onset  . Hypertension Mother   . Diabetes Mother   . Alzheimer's disease Mother   . Hypertension Father   . Diabetes Father   . Hypertension Brother    the patient's father died in his 51s from complications of diabetes. The patient's mother is living, in her mid 49s. The patient had 2 brothers, and 2 sisters. There is no  history of cancer in the family to her knowledge.  GYNECOLOGIC HISTORY:  Menarche age 60, first live birth age 52. The patient is GX P2. She is postmenopausal, but did have some perimenopausal bleeding, which was evaluated age 39 08/14/2012 with a transabdominal and transvaginal ultrasound of the pelvis which  found a normal uterine myometrium and left ovary. The right ovary could not be visualized.  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 Her husband Brittney Vance also works for the [employer] system. Their children are Brittney Vance, 8, and Brittney Vance, 5. Brittney Vance's mother currently lives in the home as well. The patient attends a [employer]   ADVANCED DIRECTIVES: Not in place   HEALTH MAINTENANCE: (Updated 05/21/2013) History  Substance Use Topics  . Smoking status: Never Smoker   . Smokeless tobacco: Never Used  . Alcohol Use: 1.2 oz/week    2 Glasses of wine per week     Comment: occassion     Colonoscopy:- Never  PAP: 2013, Dr. Nicholaus Bloom  Bone density: Never  Lipid panel: Dr. Linford Arnold    No Known Allergies  Current Outpatient Prescriptions  Medication Sig Dispense Refill  . hydrochlorothiazide (HYDRODIURIL) 25 MG tablet Take 25 mg by mouth daily after lunch.      . levothyroxine (SYNTHROID, LEVOTHROID) 88 MCG tablet Take 88 mcg by mouth daily before breakfast.      . lidocaine-prilocaine (EMLA) cream Apply topically as needed. 1-2 hrs before each procedure  30 g  2  . prochlorperazine (COMPAZINE) 10 MG tablet Take one tablet 4 times a day (ac and hs)starting the evening of chemotherapy, and continue an additional 3 days  30 tablet  4  . HYDROcodone-acetaminophen (NORCO/VICODIN) 5-325 MG per tablet TAKE 1 OR 2 TABLETS BY MOUTH EVERY 6 HOURS AS NEEDED FOR PAIN  30 tablet  0   No current facility-administered medications for this visit.   Facility-Administered Medications Ordered in Other Visits  Medication Dose Route Frequency Provider Last Rate Last Dose  . sodium chloride 0.9 % injection 10 mL  10 mL Intracatheter PRN Catalina Gravel, PA-C   10 mL at 06/25/13 1537    OBJECTIVE: [name] in no acute distress    Filed Vitals:   06/25/13 1017  BP: 121/83  Pulse: 102  Temp: 98.5 F (36.9 C)  Resp: 18     Body mass index is 25.38 kg/(m^2).    ECOG FS:0 Filed Weights   06/25/13 1017  Weight: 138 lb 12.8 oz (62.959 kg)   Physical Exam: HEENT:  Sclerae anicteric.  Oropharynx clear. No ulcerations. No evidence of candidiasis. There is some hyperpigmentation in the central portion of the tongue. NODES:  No cervical or supraclavicular lymphadenopathy palpated.  BREAST EXAM:  Deferred. No axillary lymphadenopathy palpated. LUNGS:  Clear to auscultation bilaterally.  No wheezes or rhonchi.  Good excursion bilaterally. HEART:  Regular rate and rhythm. No murmur  ABDOMEN:  Soft, nontender.  Positive bowel sounds.  MSK:  No focal spinal tenderness to palpation. Good range of motion bilaterally in the upper extremities. EXTREMITIES:  No peripheral edema.   SKIN:  Slight hyperpigmentation noted on the hands bilaterally, no significant nail dyscrasia, no rashes, no cracking or peeling. NEURO:  Nonfocal. Well oriented.  Anxious affect.     LAB RESULTS:  Lab Results  Component Value Date   WBC  6.8 06/25/2013   NEUTROABS 4.6 06/25/2013   HGB 9.9* 06/25/2013   HCT 29.5* 06/25/2013   MCV 89.4 06/25/2013   PLT 202 06/25/2013      Chemistry      Component Value Date/Time   NA 143 06/25/2013 0956   NA 136 04/11/2013 0927   K 3.7 06/25/2013 0956   K 3.9 04/11/2013 0927   CL 103 04/11/2013 0927   CO2 26 06/25/2013 0956   CO2 29 04/11/2013 0927   BUN 9.7 06/25/2013 0956   BUN 10 04/11/2013 0927   CREATININE 0.6 06/25/2013 0956   CREATININE 0.67 04/11/2013 0927   CREATININE 0.65 05/21/2009 2243      Component Value Date/Time   CALCIUM 9.3 06/25/2013 0956   CALCIUM 9.5 04/11/2013 0927   ALKPHOS 78 06/25/2013 0956   ALKPHOS 70 01/19/2013 0910   AST 18 06/25/2013 0956   AST 21 01/19/2013 0910   ALT 18 06/25/2013 0956   ALT 20 01/19/2013 0910   BILITOT <0.20 06/25/2013 0956   BILITOT 0.4 01/19/2013 0910         STUDIES: Mr Breast Bilateral W Wo Contrast  06/15/2013   CLINICAL DATA:  Biopsy-proven upper-outer quadrant left breast cancer. Question response to neoadjuvant chemotherapy.  EXAM: MR BILATERAL BREAST WITHOUT AND WITH CONTRAST  LABS:  Not applicable  TECHNIQUE: Multiplanar, multisequence MR images of both breasts were obtained prior to and following the intravenous administration of 12ml of MultiHance.  THREE-DIMENSIONAL MR IMAGE RENDERING ON INDEPENDENT WORKSTATION:  Three-dimensional MR images were rendered by post-processing of the original MR data on an independent workstation. The three-dimensional MR images were interpreted, and findings are reported in the following complete MRI report for this study.  COMPARISON:  Previous exams  FINDINGS: Breast composition: c:  Heterogeneous fibroglandular tissue  Background parenchymal enhancement: Moderate  Right breast: No mass or abnormal enhancement.  Left breast: Interval decrease in size of irregular enhancing mass in the posterior upper outer left breast. The mass measures 1.7 x 0.9 x 1.4 cm compared with 2.5 x 1.9 x 2 cm previously. No additional areas of abnormal enhancement.  Lymph nodes: No abnormal appearing lymph nodes.  Ancillary findings:  None.  IMPRESSION: Interval positive response to therapy, with biopsy proven left breast carcinoma measuring 1.7 x 0.9 x 1.4 cm compared with 2.5 x 1.9 x 2 cm previously.  RECOMMENDATION: Treatment planning.  BI-RADS CATEGORY  6: Known biopsy-proven malignancy - appropriate action should be taken.   Electronically Signed   By: Jerene Dilling M.D.   On: 06/15/2013 13:42     ASSESSMENT: 47 y.o. [place] woman   (1)  status post left breast upper outer quadrant biopsy 04/06/2013 for a clinical T2 N1, stage IIB invasive ductal carcinoma, high-grade, triple negative, with an MIB-1 of 65%  (2)  being treated in the neoadjuvant setting,   (a) completed 4 dose dense cycles of doxorubicin/  cyclophosphamide 06/11/2013,.  (b)  0n 06/25/2013 starting 12 weekly cycles of carboplatin/ paclitaxel, to be followed by definitive surgery   PLAN: Dr. Darnelle Catalan and I both saw this patient today. Caelynn is ready to proceed with her first weekly dose of carboplatin/paclitaxel today. We will follow her very closely for increased signs of peripheral neuropathy. As noted above, she does have her antinausea medications on hand, and knows to follow the recommendations very closely for at least the first 2 cycles.   I am scheduled to see Kiyra again next week prior to her second weekly dose of  carboplatin/paclitaxel. We discussed the fact that her graduation is scheduled on Thursday, after treatment on Monday. If she feels very poorly this week after treatment, we will consider holding treatment next week so that she feels well for her graduation ceremony.    Chandler voices her understanding and agreement with the above. She knows to call for any problems that may develop before her next visit here   Shakim Faith, PA-C   06/25/2013 6:36 PM    ADDENDUM: Brittney Vance did well over all with the first part of her chemotherapy, namely doxorubicin and cyclophosphamide. We're switching to the weekly treatments with carboplatin and paclitaxel and she has a good understanding of the possible toxicities, side effects and complications, as well as the possible benefits. She is in agreement with proceeding with treatment, and knows the goal of treatment is cure. She will call with any problems that may develop before her next visit here.  I personally saw this patient and performed a substantive portion of this encounter with the listed APP documented above.   Lowella Dell, MD

## 2013-06-25 NOTE — Patient Instructions (Signed)
[facility] Discharge Instructions for Patients Receiving Chemotherapy  Today you received the following chemotherapy agents: Carboplatin, Taxol  To help prevent nausea and vomiting after your treatment, we encourage you to take your nausea medication as prescribed.   If you develop nausea and vomiting that is not controlled by your nausea medication, call the clinic.   BELOW ARE SYMPTOMS THAT SHOULD BE REPORTED IMMEDIATELY:  *FEVER GREATER THAN 100.5 F  *CHILLS WITH OR WITHOUT FEVER  NAUSEA AND VOMITING THAT IS NOT CONTROLLED WITH YOUR NAUSEA MEDICATION  *UNUSUAL SHORTNESS OF BREATH  *UNUSUAL BRUISING OR BLEEDING  TENDERNESS IN MOUTH AND THROAT WITH OR WITHOUT PRESENCE OF ULCERS  *URINARY PROBLEMS  *BOWEL PROBLEMS  UNUSUAL RASH Items with * indicate a potential emergency and should be followed up as soon as possible.  Feel free to call the clinic you have any questions or concerns. The clinic phone number is [phone].  Carboplatin injection What is this medicine? CARBOPLATIN (KAR boe pla tin) is a chemotherapy drug. It targets fast dividing cells, like cancer cells, and causes these cells to die. This medicine is used to treat ovarian cancer and many other cancers. This medicine may be used for other purposes; ask your health care provider or pharmacist if you have questions. COMMON BRAND NAME(S): Paraplatin What should I tell my health care provider before I take this medicine? They need to know if you have any of these conditions: -blood disorders -hearing problems -kidney disease -recent or ongoing radiation therapy -an unusual or allergic reaction to carboplatin, cisplatin, other chemotherapy, other medicines, foods, dyes, or preservatives -pregnant or trying to get pregnant -breast-feeding How should I use this medicine? This drug is usually given as an infusion into a vein. It is administered in a hospital or clinic by a specially trained  health care professional. Talk to your pediatrician regarding the use of this medicine in children. Special care may be needed. Overdosage: If you think you have taken too much of this medicine contact a poison control center or emergency room at once. NOTE: This medicine is only for you. Do not share this medicine with others. What if I miss a dose? It is important not to miss a dose. Call your doctor or health care professional if you are unable to keep an appointment. What may interact with this medicine? -medicines for seizures -medicines to increase blood counts like filgrastim, pegfilgrastim, sargramostim -some antibiotics like amikacin, gentamicin, neomycin, streptomycin, tobramycin -vaccines Talk to your doctor or health care professional before taking any of these medicines: -acetaminophen -aspirin -ibuprofen -ketoprofen -naproxen This list may not describe all possible interactions. Give your health care provider a list of all the medicines, herbs, non-prescription drugs, or dietary supplements you use. Also tell them if you smoke, drink alcohol, or use illegal drugs. Some items may interact with your medicine. What should I watch for while using this medicine? Your condition will be monitored carefully while you are receiving this medicine. You will need important blood work done while you are taking this medicine. This drug may make you feel generally unwell. This is not uncommon, as chemotherapy can affect healthy cells as well as cancer cells. Report any side effects. Continue your course of treatment even though you feel ill unless your doctor tells you to stop. In some cases, you may be given additional medicines to help with side effects. Follow all directions for their use. Call your doctor or health care professional for advice if you get  a fever, chills or sore throat, or other symptoms of a cold or flu. Do not treat yourself. This drug decreases your body's ability to fight  infections. Try to avoid being around people who are sick. This medicine may increase your risk to bruise or bleed. Call your doctor or health care professional if you notice any unusual bleeding. Be careful brushing and flossing your teeth or using a toothpick because you may get an infection or bleed more easily. If you have any dental work done, tell your dentist you are receiving this medicine. Avoid taking products that contain aspirin, acetaminophen, ibuprofen, naproxen, or ketoprofen unless instructed by your doctor. These medicines may hide a fever. Do not become pregnant while taking this medicine. Women should inform their doctor if they wish to become pregnant or think they might be pregnant. There is a potential for serious side effects to an unborn child. Talk to your health care professional or pharmacist for more information. Do not breast-feed an infant while taking this medicine. What side effects may I notice from receiving this medicine? Side effects that you should report to your doctor or health care professional as soon as possible: -allergic reactions like skin rash, itching or hives, swelling of the face, lips, or tongue -signs of infection - fever or chills, cough, sore throat, pain or difficulty passing urine -signs of decreased platelets or bleeding - bruising, pinpoint red spots on the skin, black, tarry stools, nosebleeds -signs of decreased red blood cells - unusually weak or tired, fainting spells, lightheadedness -breathing problems -changes in hearing -changes in vision -chest pain -high blood pressure -low blood counts - This drug may decrease the number of white blood cells, red blood cells and platelets. You may be at increased risk for infections and bleeding. -nausea and vomiting -pain, swelling, redness or irritation at the injection site -pain, tingling, numbness in the hands or feet -problems with balance, talking, walking -trouble passing urine or change  in the amount of urine Side effects that usually do not require medical attention (report to your doctor or health care professional if they continue or are bothersome): -hair loss -loss of appetite -metallic taste in the mouth or changes in taste This list may not describe all possible side effects. Call your doctor for medical advice about side effects. You may report side effects to FDA at 1-800-FDA-1088. Where should I keep my medicine? This drug is given in a hospital or clinic and will not be stored at home. NOTE: This sheet is a summary. It may not cover all possible information. If you have questions about this medicine, talk to your doctor, pharmacist, or health care provider.  2014, Elsevier/Gold Standard. (2007-10-17 14:38:05)  Paclitaxel injection - Taxol What is this medicine? PACLITAXEL (PAK li TAX el) is a chemotherapy drug. It targets fast dividing cells, like cancer cells, and causes these cells to die. This medicine is used to treat ovarian cancer, breast cancer, and other cancers. This medicine may be used for other purposes; ask your health care provider or pharmacist if you have questions. COMMON BRAND NAME(S): Onxol , Taxol What should I tell my health care provider before I take this medicine? They need to know if you have any of these conditions: -blood disorders -irregular heartbeat -infection (especially a virus infection such as chickenpox, cold sores, or herpes) -liver disease -previous or ongoing radiation therapy -an unusual or allergic reaction to paclitaxel, alcohol, polyoxyethylated castor oil, other chemotherapy agents, other medicines, foods, dyes, or preservatives -  pregnant or trying to get pregnant -breast-feeding How should I use this medicine? This drug is given as an infusion into a vein. It is administered in a hospital or clinic by a specially trained health care professional. Talk to your pediatrician regarding the use of this medicine in  children. Special care may be needed. Overdosage: If you think you have taken too much of this medicine contact a poison control center or emergency room at once. NOTE: This medicine is only for you. Do not share this medicine with others. What if I miss a dose? It is important not to miss your dose. Call your doctor or health care professional if you are unable to keep an appointment. What may interact with this medicine? Do not take this medicine with any of the following medications: -disulfiram -metronidazole This medicine may also interact with the following medications: -cyclosporine -diazepam -ketoconazole -medicines to increase blood counts like filgrastim, pegfilgrastim, sargramostim -other chemotherapy drugs like cisplatin, doxorubicin, epirubicin, etoposide, teniposide, vincristine -quinidine -testosterone -vaccines -verapamil Talk to your doctor or health care professional before taking any of these medicines: -acetaminophen -aspirin -ibuprofen -ketoprofen -naproxen This list may not describe all possible interactions. Give your health care provider a list of all the medicines, herbs, non-prescription drugs, or dietary supplements you use. Also tell them if you smoke, drink alcohol, or use illegal drugs. Some items may interact with your medicine. What should I watch for while using this medicine? Your condition will be monitored carefully while you are receiving this medicine. You will need important blood work done while you are taking this medicine. This drug may make you feel generally unwell. This is not uncommon, as chemotherapy can affect healthy cells as well as cancer cells. Report any side effects. Continue your course of treatment even though you feel ill unless your doctor tells you to stop. In some cases, you may be given additional medicines to help with side effects. Follow all directions for their use. Call your doctor or health care professional for advice  if you get a fever, chills or sore throat, or other symptoms of a cold or flu. Do not treat yourself. This drug decreases your body's ability to fight infections. Try to avoid being around people who are sick. This medicine may increase your risk to bruise or bleed. Call your doctor or health care professional if you notice any unusual bleeding. Be careful brushing and flossing your teeth or using a toothpick because you may get an infection or bleed more easily. If you have any dental work done, tell your dentist you are receiving this medicine. Avoid taking products that contain aspirin, acetaminophen, ibuprofen, naproxen, or ketoprofen unless instructed by your doctor. These medicines may hide a fever. Do not become pregnant while taking this medicine. Women should inform their doctor if they wish to become pregnant or think they might be pregnant. There is a potential for serious side effects to an unborn child. Talk to your health care professional or pharmacist for more information. Do not breast-feed an infant while taking this medicine. Men are advised not to father a child while receiving this medicine. What side effects may I notice from receiving this medicine? Side effects that you should report to your doctor or health care professional as soon as possible: -allergic reactions like skin rash, itching or hives, swelling of the face, lips, or tongue -low blood counts - This drug may decrease the number of white blood cells, red blood cells and platelets. You may  be at increased risk for infections and bleeding. -signs of infection - fever or chills, cough, sore throat, pain or difficulty passing urine -signs of decreased platelets or bleeding - bruising, pinpoint red spots on the skin, black, tarry stools, nosebleeds -signs of decreased red blood cells - unusually weak or tired, fainting spells, lightheadedness -breathing problems -chest pain -high or low blood pressure -mouth  sores -nausea and vomiting -pain, swelling, redness or irritation at the injection site -pain, tingling, numbness in the hands or feet -slow or irregular heartbeat -swelling of the ankle, feet, hands Side effects that usually do not require medical attention (report to your doctor or health care professional if they continue or are bothersome): -bone pain -complete hair loss including hair on your head, underarms, pubic hair, eyebrows, and eyelashes -changes in the color of fingernails -diarrhea -loosening of the fingernails -loss of appetite -muscle or joint pain -red flush to skin -sweating This list may not describe all possible side effects. Call your doctor for medical advice about side effects. You may report side effects to FDA at 1-800-FDA-1088. Where should I keep my medicine? This drug is given in a hospital or clinic and will not be stored at home. NOTE: This sheet is a summary. It may not cover all possible information. If you have questions about this medicine, talk to your doctor, pharmacist, or health care provider.  2014, Elsevier/Gold Standard. (2012-09-04 16:41:21)

## 2013-06-25 NOTE — Telephone Encounter (Signed)
Per staff message and POF I have scheduled appts.  JMW  

## 2013-07-02 ENCOUNTER — Ambulatory Visit (HOSPITAL_BASED_OUTPATIENT_CLINIC_OR_DEPARTMENT_OTHER): Payer: BC Managed Care – PPO | Admitting: Physician Assistant

## 2013-07-02 ENCOUNTER — Other Ambulatory Visit (HOSPITAL_BASED_OUTPATIENT_CLINIC_OR_DEPARTMENT_OTHER): Payer: BC Managed Care – PPO | Admitting: Lab

## 2013-07-02 ENCOUNTER — Ambulatory Visit (HOSPITAL_BASED_OUTPATIENT_CLINIC_OR_DEPARTMENT_OTHER): Payer: BC Managed Care – PPO

## 2013-07-02 ENCOUNTER — Encounter: Payer: Self-pay | Admitting: Physician Assistant

## 2013-07-02 VITALS — BP 127/85 | HR 99 | Temp 98.8°F | Resp 16 | Ht 62.0 in | Wt 142.2 lb

## 2013-07-02 DIAGNOSIS — Z171 Estrogen receptor negative status [ER-]: Secondary | ICD-10-CM

## 2013-07-02 DIAGNOSIS — C50419 Malignant neoplasm of upper-outer quadrant of unspecified female breast: Secondary | ICD-10-CM

## 2013-07-02 DIAGNOSIS — H04201 Unspecified epiphora, right lacrimal gland: Secondary | ICD-10-CM

## 2013-07-02 DIAGNOSIS — D649 Anemia, unspecified: Secondary | ICD-10-CM

## 2013-07-02 DIAGNOSIS — C50412 Malignant neoplasm of upper-outer quadrant of left female breast: Secondary | ICD-10-CM

## 2013-07-02 DIAGNOSIS — Z5111 Encounter for antineoplastic chemotherapy: Secondary | ICD-10-CM

## 2013-07-02 LAB — CBC WITH DIFFERENTIAL/PLATELET
Basophils Absolute: 0.1 10*3/uL (ref 0.0–0.1)
EOS%: 0.8 % (ref 0.0–7.0)
Eosinophils Absolute: 0 10*3/uL (ref 0.0–0.5)
HCT: 30.2 % — ABNORMAL LOW (ref 34.8–46.6)
HGB: 10 g/dL — ABNORMAL LOW (ref 11.6–15.9)
LYMPH%: 23.7 % (ref 14.0–49.7)
MCH: 30.4 pg (ref 25.1–34.0)
MCHC: 33.1 g/dL (ref 31.5–36.0)
MCV: 91.8 fL (ref 79.5–101.0)
MONO%: 11.2 % (ref 0.0–14.0)
NEUT#: 2.5 10*3/uL (ref 1.5–6.5)
Platelets: 379 10*3/uL (ref 145–400)
RBC: 3.29 10*6/uL — ABNORMAL LOW (ref 3.70–5.45)
RDW: 16.8 % — ABNORMAL HIGH (ref 11.2–14.5)
nRBC: 1 % — ABNORMAL HIGH (ref 0–0)

## 2013-07-02 MED ORDER — DIPHENHYDRAMINE HCL 50 MG/ML IJ SOLN
INTRAMUSCULAR | Status: AC
  Filled 2013-07-02: qty 1

## 2013-07-02 MED ORDER — DEXAMETHASONE SODIUM PHOSPHATE 20 MG/5ML IJ SOLN
INTRAMUSCULAR | Status: AC
  Filled 2013-07-02: qty 5

## 2013-07-02 MED ORDER — HEPARIN SOD (PORK) LOCK FLUSH 100 UNIT/ML IV SOLN
500.0000 [IU] | Freq: Once | INTRAVENOUS | Status: AC | PRN
  Administered 2013-07-02: 500 [IU]
  Filled 2013-07-02: qty 5

## 2013-07-02 MED ORDER — SODIUM CHLORIDE 0.9 % IV SOLN
80.0000 mg/m2 | Freq: Once | INTRAVENOUS | Status: AC
  Administered 2013-07-02: 132 mg via INTRAVENOUS
  Filled 2013-07-02: qty 22

## 2013-07-02 MED ORDER — DEXAMETHASONE SODIUM PHOSPHATE 20 MG/5ML IJ SOLN
20.0000 mg | Freq: Once | INTRAMUSCULAR | Status: AC
  Administered 2013-07-02: 20 mg via INTRAVENOUS

## 2013-07-02 MED ORDER — ONDANSETRON 16 MG/50ML IVPB (CHCC)
16.0000 mg | Freq: Once | INTRAVENOUS | Status: AC
  Administered 2013-07-02: 16 mg via INTRAVENOUS

## 2013-07-02 MED ORDER — FAMOTIDINE IN NACL 20-0.9 MG/50ML-% IV SOLN
INTRAVENOUS | Status: AC
  Filled 2013-07-02: qty 50

## 2013-07-02 MED ORDER — SODIUM CHLORIDE 0.9 % IJ SOLN
10.0000 mL | INTRAMUSCULAR | Status: DC | PRN
  Administered 2013-07-02: 10 mL
  Filled 2013-07-02: qty 10

## 2013-07-02 MED ORDER — TOBRAMYCIN-DEXAMETHASONE 0.3-0.1 % OP SUSP: 1.0000 [drp] | Freq: Two times a day (BID) | OPHTHALMIC | Status: DC

## 2013-07-02 MED ORDER — SODIUM CHLORIDE 0.9 % IV SOLN
Freq: Once | INTRAVENOUS | Status: AC
  Administered 2013-07-02: 12:00:00 via INTRAVENOUS

## 2013-07-02 MED ORDER — SODIUM CHLORIDE 0.9 % IV SOLN
278.0000 mg | Freq: Once | INTRAVENOUS | Status: AC
  Administered 2013-07-02: 280 mg via INTRAVENOUS
  Filled 2013-07-02: qty 28

## 2013-07-02 MED ORDER — FAMOTIDINE IN NACL 20-0.9 MG/50ML-% IV SOLN
20.0000 mg | Freq: Once | INTRAVENOUS | Status: AC
  Administered 2013-07-02: 20 mg via INTRAVENOUS

## 2013-07-02 MED ORDER — DIPHENHYDRAMINE HCL 50 MG/ML IJ SOLN
50.0000 mg | Freq: Once | INTRAMUSCULAR | Status: AC
  Administered 2013-07-02: 50 mg via INTRAVENOUS

## 2013-07-02 MED ORDER — ONDANSETRON 16 MG/50ML IVPB (CHCC)
INTRAVENOUS | Status: AC
  Filled 2013-07-02: qty 16

## 2013-07-02 NOTE — Progress Notes (Signed)
[name] OB: 1966-03-18  MR#: [id number]  WGN#:[id number]  PCP: Nani Gasser, MD GYN:  Grant Ruts SU: Avel Peace OTHER MD: Hal Neer  CHIEF COMPLAINT:  Left Breast Cancer, neoadjuvant chemotherapy  HISTORY OF PRESENT ILLNESS: Brittney Vance had routine screening mammography 03/09/2013 showing heterogeneously dense breasts, and a possible asymmetry in the left breast. Diagnostic left mammography and ultrasonography at the breast Center 03/30/2013 found a lobulated mass in the left breast upper outer quadrant measuring 1.6 cm. This was palpable. Ultrasound showed a complex microlobulated hypoechoic mass measuring 1.8 cm. There was no left axillary lymphadenopathy noted.  Biopsy of the mass in question 04/06/2013 showed ([id number]) and invasive ductal carcinoma, grade 3, triple negative, with an MIB-1 of 65%.  Bilateral breast MRI 04/13/2013 found an irregular mass measuring 2.5 cm in the left breast with a few lymph nodes that showed moderate cortical thickness, the largest measuring 12 mm (level I).  The patient's subsequent history is as detailed below  INTERVAL HISTORY: Brittney Vance returns alone today  for followup of her locally advanced left breast cancer. She is being treated neoadjuvantly, and is due for her second of 12 planned weekly doses of carboplatin/paclitaxel.  Geneal tolerated her first dose well last week. She has had a decreased appetite, but denies any problems with nausea or emesis. Her biggest complaint is significant fatigue. She's also had some "crustiness" around her right eye when she wakes up in the morning. No drainage or matting, and no redness, pain, or itching.  Brittney Vance is completing some last minute assignments, and plans to graduate on Thursday, 07/05/2013, from [employer] with her Masters in [employer].  REVIEW OF SYSTEMS: Brittney Vance has had no fevers, chills, or night sweats. She denies any rashes, abnormal bruising or bleeding.  She's had no numbness or  tingling in her fingers or toes this week, no signs of peripheral neuropathy. She denies any mouth ulcers or oral sensitivity. She's had no change in bowel or bladder habits. She denies any cough, phlegm production, increased shortness of breath, chest pain, or palpitations. She's had no abnormal headaches or dizziness, and denies any unusual myalgias, arthralgias, bony pain, or peripheral swelling.  A detailed review of systems is otherwise stable and noncontributory.   PAST MEDICAL HISTORY: Past Medical History  Diagnosis Date  . Fibroids   . Hypertension   . Hypothyroid   . Breast cancer     PAST SURGICAL HISTORY: Past Surgical History  Procedure Laterality Date  . Tubal ligation    . Portacath placement Right 04/27/2013    Procedure: ULTRASOUND GUIDED PORT INSERTION WITH FLUOROSCOPY;  Surgeon: Adolph Pollack, MD;  Location: [facility];  Service: General;  Laterality: Right;    FAMILY HISTORY Family History  Problem Relation Age of Onset  . Hypertension Mother   . Diabetes Mother   . Alzheimer's disease Mother   . Hypertension Father   . Diabetes Father   . Hypertension Brother    the patient's father died in his 89s from complications of diabetes. The patient's mother is living, in her mid 90s. The patient had 2 brothers, and 2 sisters. There is no history of cancer in the family to her knowledge.  GYNECOLOGIC HISTORY:  Menarche age 39, first live birth age 89. The patient is GX P2. She is postmenopausal, but did have some perimenopausal bleeding, which was evaluated age 46 08/14/2012 with a transabdominal and transvaginal ultrasound of the pelvis which found a normal uterine myometrium and left ovary. The right ovary could  not be visualized.  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 Her husband Brittney Vance also works for the [employer] system. Their children are Brittney Vance, 8, and Brittney Vance, 5. Brittney Vance's mother currently lives in the home as well. The patient attends a [employer]   ADVANCED DIRECTIVES: Not in place   HEALTH MAINTENANCE: (Updated 05/21/2013) History  Substance Use Topics  . Smoking status: Never Smoker   . Smokeless tobacco: Never Used  . Alcohol Use: 1.2 oz/week    2 Glasses of wine per week     Comment: occassion     Colonoscopy:- Never  PAP: 2013, Dr. Nicholaus Bloom  Bone density: Never  Lipid panel: Dr. Linford Arnold    No Known Allergies  Current Outpatient Prescriptions  Medication Sig Dispense Refill  . hydrochlorothiazide (HYDRODIURIL) 25 MG tablet Take 25 mg by mouth daily after lunch.      Marland Kitchen HYDROcodone-acetaminophen (NORCO/VICODIN) 5-325 MG per tablet TAKE 1 OR 2 TABLETS BY MOUTH EVERY 6 HOURS AS NEEDED FOR PAIN  30 tablet  0  . levothyroxine (SYNTHROID, LEVOTHROID) 88 MCG tablet Take 88 mcg by mouth daily before breakfast.      . lidocaine-prilocaine (EMLA) cream Apply topically as needed. 1-2 hrs before each procedure  30 g  2  . prochlorperazine (COMPAZINE) 10 MG tablet Take one tablet 4 times a day (ac and hs)starting the evening of chemotherapy, and continue an additional 3 days  30 tablet  4  . tobramycin-dexamethasone (TOBRADEX) ophthalmic solution Place 1 drop into both eyes 2 (two) times daily.  5 mL  0   No current facility-administered medications for this visit.   Facility-Administered Medications Ordered in Other Visits  Medication Dose Route Frequency Provider Last Rate Last Dose  . CARBOplatin (PARAPLATIN) 280 mg in sodium chloride 0.9 % 100 mL chemo infusion  280 mg Intravenous Once [name], PA-C      . famotidine (PEPCID) IVPB 20 mg  20 mg Intravenous Once [name], PA-C      . heparin lock flush 100 unit/mL  500 Units Intracatheter Once PRN [name], PA-C      . ondansetron (ZOFRAN) IVPB 16 mg  16 mg Intravenous Once [name], PA-C   16 mg at  07/02/13 1200  . PACLitaxel (TAXOL) 132 mg in sodium chloride 0.9 % 250 mL chemo infusion (</= 80mg /m2)  80 mg/m2 (Treatment Plan Actual) Intravenous Once Elgin Carn G Natilie Krabbenhoft, PA-C      . sodium chloride 0.9 % injection 10 mL  10 mL Intracatheter PRN Arabelle Bollig [name], PA-C        OBJECTIVE: [name] in no acute distress    Filed Vitals:   07/02/13 1212  BP: 127/85  Pulse: 99  Temp: 98.8 F (37.1 C)  Resp: 16     Body mass index is 26 kg/(m^2).    ECOG FS:0 Filed Weights   07/02/13 1212  Weight: 142 lb 3.2 oz (64.501 kg)   Physical Exam: HEENT:  Sclerae anicteric.  No tearing or drainage noted in either eye, and no significant erythema. Oropharynx clear and moist. No ulcerations or candidiasis.  NODES:  No cervical or supraclavicular lymphadenopathy palpated.  BREAST EXAM:  Deferred. No axillary lymphadenopathy palpated. LUNGS:  Clear to auscultation bilaterally.  No wheezes or rhonchi.   HEART:  Regular rate  and rhythm. No murmur  ABDOMEN:  Soft, nontender to palpation.  Positive bowel sounds.  MSK:  No focal spinal tenderness to palpation. Good range of motion bilaterally in the upper extremities. EXTREMITIES:  No peripheral edema.   SKIN:  Slight hyperpigmentation noted on the hands bilaterally, no significant nail dyscrasia, no rashes, no cracking or peeling. NEURO:  Nonfocal. Well oriented.  Appropriate affect.     LAB RESULTS:  Lab Results  Component Value Date   WBC 3.9 07/02/2013   NEUTROABS 2.5 07/02/2013   HGB 10.0* 07/02/2013   HCT 30.2* 07/02/2013   MCV 91.8 07/02/2013   PLT 379 07/02/2013      Chemistry      Component Value Date/Time   NA 143 06/25/2013 0956   NA 136 04/11/2013 0927   K 3.7 06/25/2013 0956   K 3.9 04/11/2013 0927   CL 103 04/11/2013 0927   CO2 26 06/25/2013 0956   CO2 29 04/11/2013 0927   BUN 9.7 06/25/2013 0956   BUN 10 04/11/2013 0927   CREATININE 0.6 06/25/2013 0956   CREATININE 0.67 04/11/2013 0927   CREATININE 0.65 05/21/2009 2243       Component Value Date/Time   CALCIUM 9.3 06/25/2013 0956   CALCIUM 9.5 04/11/2013 0927   ALKPHOS 78 06/25/2013 0956   ALKPHOS 70 01/19/2013 0910   AST 18 06/25/2013 0956   AST 21 01/19/2013 0910   ALT 18 06/25/2013 0956   ALT 20 01/19/2013 0910   BILITOT <0.20 06/25/2013 0956   BILITOT 0.4 01/19/2013 0910        STUDIES: Mr Breast Bilateral W Wo Contrast  06/15/2013   CLINICAL DATA:  Biopsy-proven upper-outer quadrant left breast cancer. Question response to neoadjuvant chemotherapy.  EXAM: MR BILATERAL BREAST WITHOUT AND WITH CONTRAST  LABS:  Not applicable  TECHNIQUE: Multiplanar, multisequence MR images of both breasts were obtained prior to and following the intravenous administration of 12ml of MultiHance.  THREE-DIMENSIONAL MR IMAGE RENDERING ON INDEPENDENT WORKSTATION:  Three-dimensional MR images were rendered by post-processing of the original MR data on an independent workstation. The three-dimensional MR images were interpreted, and findings are reported in the following complete MRI report for this study.  COMPARISON:  Previous exams  FINDINGS: Breast composition: c:  Heterogeneous fibroglandular tissue  Background parenchymal enhancement: Moderate  Right breast: No mass or abnormal enhancement.  Left breast: Interval decrease in size of irregular enhancing mass in the posterior upper outer left breast. The mass measures 1.7 x 0.9 x 1.4 cm compared with 2.5 x 1.9 x 2 cm previously. No additional areas of abnormal enhancement.  Lymph nodes: No abnormal appearing lymph nodes.  Ancillary findings:  None.  IMPRESSION: Interval positive response to therapy, with biopsy proven left breast carcinoma measuring 1.7 x 0.9 x 1.4 cm compared with 2.5 x 1.9 x 2 cm previously.  RECOMMENDATION: Treatment planning.  BI-RADS CATEGORY  6: Known biopsy-proven malignancy - appropriate action should be taken.   Electronically Signed   By: Jerene Dilling M.D.   On: 06/15/2013 13:42     ASSESSMENT: 47 y.o.  [place] woman   (1)  status post left breast upper outer quadrant biopsy 04/06/2013 for a clinical T2 N1, stage IIB invasive ductal carcinoma, high-grade, triple negative, with an MIB-1 of 65%  (2)  being treated in the neoadjuvant setting,   (a) completed 4 dose dense cycles of doxorubicin/ cyclophosphamide 06/11/2013,.  (b)  0n 06/25/2013 starting 12 weekly cycles of carboplatin/ paclitaxel, to be followed by  definitive surgery   PLAN: Taler will proceed to treatment today as scheduled for her second cycle of carboplatin/paclitaxel. Continue to follow her closely with labs and physical exam on a weekly basis, her next appointment being next Monday, December 15. Our plan is to complete a total of 12 weekly cycles.  Renae Fickle was hoping to go on a trip over the Christmas holidays but is not going to be up to traveling due to side effects associated with treatment. We will help her complete some travel insurance forms she has brought in today.   Aliscia voices her understanding and agreement with the above. She knows to call for any problems that may develop before her next visit here   Cherree Conerly, PA-C   07/02/2013 12:13 PM

## 2013-07-02 NOTE — Patient Instructions (Signed)
[facility] Discharge Instructions for Patients Receiving Chemotherapy  Today you received the following chemotherapy agents Taxol, Carboplatin  To help prevent nausea and vomiting after your treatment, we encourage you to take your nausea medication as directed.   If you develop nausea and vomiting that is not controlled by your nausea medication, call the clinic.   BELOW ARE SYMPTOMS THAT SHOULD BE REPORTED IMMEDIATELY:  *FEVER GREATER THAN 100.5 F  *CHILLS WITH OR WITHOUT FEVER  NAUSEA AND VOMITING THAT IS NOT CONTROLLED WITH YOUR NAUSEA MEDICATION  *UNUSUAL SHORTNESS OF BREATH  *UNUSUAL BRUISING OR BLEEDING  TENDERNESS IN MOUTH AND THROAT WITH OR WITHOUT PRESENCE OF ULCERS  *URINARY PROBLEMS  *BOWEL PROBLEMS  UNUSUAL RASH Items with * indicate a potential emergency and should be followed up as soon as possible.  Feel free to call the clinic you have any questions or concerns. The clinic phone number is [phone].    

## 2013-07-03 ENCOUNTER — Telehealth: Payer: Self-pay | Admitting: *Deleted

## 2013-07-03 NOTE — Telephone Encounter (Signed)
PT. REPORTS A LOT OF GAS AND DIARRHEA FIVE TIMES SINCE HER CHEMOTHERAPY. SHE IS FORCING FLUIDS. THERE ARE NO OTHER PROBLEMS. SPOKE WITH DR.MAGRINAT'S NURSE, VAL DODD,RN. PT. MAY TAKE GAS X FOR THE GAS. SOMETIMES THE TAXOL DOES CAUSE DIARRHEA. PT. MAY USE IMODIUM. NOTIFIED PT. OF THE ABOVE INFORMATION. SHE VOICES UNDERSTANDING.

## 2013-07-09 ENCOUNTER — Other Ambulatory Visit (HOSPITAL_BASED_OUTPATIENT_CLINIC_OR_DEPARTMENT_OTHER): Payer: BC Managed Care – PPO

## 2013-07-09 ENCOUNTER — Encounter: Payer: Self-pay | Admitting: Physician Assistant

## 2013-07-09 ENCOUNTER — Ambulatory Visit: Payer: BC Managed Care – PPO

## 2013-07-09 ENCOUNTER — Ambulatory Visit (HOSPITAL_BASED_OUTPATIENT_CLINIC_OR_DEPARTMENT_OTHER): Payer: BC Managed Care – PPO | Admitting: Physician Assistant

## 2013-07-09 ENCOUNTER — Encounter: Payer: Self-pay | Admitting: Oncology

## 2013-07-09 VITALS — BP 124/84 | HR 105 | Temp 98.9°F | Resp 18 | Ht 62.0 in | Wt 138.5 lb

## 2013-07-09 DIAGNOSIS — D649 Anemia, unspecified: Secondary | ICD-10-CM

## 2013-07-09 DIAGNOSIS — C50412 Malignant neoplasm of upper-outer quadrant of left female breast: Secondary | ICD-10-CM

## 2013-07-09 DIAGNOSIS — Z171 Estrogen receptor negative status [ER-]: Secondary | ICD-10-CM

## 2013-07-09 DIAGNOSIS — C50419 Malignant neoplasm of upper-outer quadrant of unspecified female breast: Secondary | ICD-10-CM

## 2013-07-09 DIAGNOSIS — R5383 Other fatigue: Secondary | ICD-10-CM

## 2013-07-09 DIAGNOSIS — R748 Abnormal levels of other serum enzymes: Secondary | ICD-10-CM

## 2013-07-09 DIAGNOSIS — E876 Hypokalemia: Secondary | ICD-10-CM | POA: Insufficient documentation

## 2013-07-09 DIAGNOSIS — R5381 Other malaise: Secondary | ICD-10-CM

## 2013-07-09 LAB — COMPREHENSIVE METABOLIC PANEL (CC13)
ALT: 57 U/L — ABNORMAL HIGH (ref 0–55)
AST: 36 U/L — ABNORMAL HIGH (ref 5–34)
Albumin: 4 g/dL (ref 3.5–5.0)
Alkaline Phosphatase: 63 U/L (ref 40–150)
BUN: 8.2 mg/dL (ref 7.0–26.0)
Calcium: 10 mg/dL (ref 8.4–10.4)
Creatinine: 0.7 mg/dL (ref 0.6–1.1)
Glucose: 116 mg/dl (ref 70–140)
Potassium: 3.4 mEq/L — ABNORMAL LOW (ref 3.5–5.1)
Sodium: 144 mEq/L (ref 136–145)
Total Bilirubin: 0.31 mg/dL (ref 0.20–1.20)

## 2013-07-09 LAB — CBC WITH DIFFERENTIAL/PLATELET
BASO%: 2 % (ref 0.0–2.0)
Basophils Absolute: 0.1 10*3/uL (ref 0.0–0.1)
EOS%: 0.7 % (ref 0.0–7.0)
Eosinophils Absolute: 0 10*3/uL (ref 0.0–0.5)
HCT: 30.9 % — ABNORMAL LOW (ref 34.8–46.6)
HGB: 10.7 g/dL — ABNORMAL LOW (ref 11.6–15.9)
LYMPH%: 22.6 % (ref 14.0–49.7)
MCH: 32.3 pg (ref 25.1–34.0)
MCV: 93.4 fL (ref 79.5–101.0)
MONO%: 13 % (ref 0.0–14.0)
NEUT#: 2 10*3/uL (ref 1.5–6.5)
NEUT%: 61.7 % (ref 38.4–76.8)
Platelets: 349 10*3/uL (ref 145–400)
RBC: 3.31 10*6/uL — ABNORMAL LOW (ref 3.70–5.45)
WBC: 3.3 10*3/uL — ABNORMAL LOW (ref 3.9–10.3)

## 2013-07-09 MED ORDER — PROCHLORPERAZINE MALEATE 10 MG PO TABS: ORAL_TABLET | ORAL | Status: DC

## 2013-07-09 NOTE — Progress Notes (Signed)
IDGunnar Fusi Vance OB: 1965/09/26  MR#: [id number]  WUJ#:[id number]  PCP: Nani Gasser, MD GYN:  Grant Ruts SU: Avel Peace OTHER MD: Hal Neer  CHIEF COMPLAINT:  Left Breast Cancer, neoadjuvant chemotherapy  HISTORY OF PRESENT ILLNESS: Peni had routine screening mammography 03/09/2013 showing heterogeneously dense breasts, and a possible asymmetry in the left breast. Diagnostic left mammography and ultrasonography at the breast Center 03/30/2013 found a lobulated mass in the left breast upper outer quadrant measuring 1.6 cm. This was palpable. Ultrasound showed a complex microlobulated hypoechoic mass measuring 1.8 cm. There was no left axillary lymphadenopathy noted.  Biopsy of the mass in question 04/06/2013 showed ([id number]) and invasive ductal carcinoma, grade 3, triple negative, with an MIB-1 of 65%.  Bilateral breast MRI 04/13/2013 found an irregular mass measuring 2.5 cm in the left breast with a few lymph nodes that showed moderate cortical thickness, the largest measuring 12 mm (level I).  The patient's subsequent history is as detailed below  INTERVAL HISTORY: Brittney Vance returns alone today  for followup of her locally advanced left breast cancer. She is being treated neoadjuvantly, and is due for her third of 12 planned weekly doses of carboplatin/paclitaxel.  Although Fizza is tolerating this chemotherapy rather well overall, she is increasingly fatigued, and this is making her more and more frustrated. She has had a very busy week. She graduated with her masters from [employer]. last Thursday. She had several functions to attend over the weekend, and has several functions scheduled for later this week due to the Christmas holidays.  She tells me she "just can't do chemo today."    Other than fatigue, Brittney Vance's primary complaint is diarrhea which occurred 4-5 times daily for several days following treatment last week. She did not take any Imodium. Finally, the diarrhea  resolved on its own. She had no excessive abdominal pain. There's been no blood or mucus in the stool. She also denies any fevers, chills, or night sweats.  REVIEW OF SYSTEMS: Brittney Vance denies any rashes, abnormal bruising or bleeding. She does have some occasional hot flashes. Her appetite is fair. She's had no nausea or emesis. She denies any mouth ulcers or oral sensitivity. She's had no change in bladder habits. She denies any cough, phlegm production, increased shortness of breath, chest pain, or palpitations. She's had no abnormal headaches or dizziness, and denies any unusual myalgias, arthralgias, bony pain, or peripheral swelling.  She denies any signs of peripheral neuropathy.   A detailed review of systems is otherwise stable and noncontributory.   PAST MEDICAL HISTORY: Past Medical History  Diagnosis Date  . Fibroids   . Hypertension   . Hypothyroid   . Breast cancer     PAST SURGICAL HISTORY: Past Surgical History  Procedure Laterality Date  . Tubal ligation    . Portacath placement Right 04/27/2013    Procedure: ULTRASOUND GUIDED PORT INSERTION WITH FLUOROSCOPY;  Surgeon: Adolph Pollack, MD;  Location: [facility];  Service: General;  Laterality: Right;    FAMILY HISTORY Family History  Problem Relation Age of Onset  . Hypertension Mother   . Diabetes Mother   . Alzheimer's disease Mother   . Hypertension Father   . Diabetes Father   . Hypertension Brother    the patient's father died in his 25s from complications of diabetes. The patient's mother is living, in her mid 27s. The patient had 2 brothers, and 2 sisters. There is no history of cancer in the family to her knowledge.  GYNECOLOGIC HISTORY:  Menarche age 45, first live birth age 63. The patient is GX P2. She is postmenopausal, but did have some perimenopausal bleeding, which was evaluated age 3 08/14/2012 with a transabdominal and transvaginal ultrasound of the pelvis which found a normal uterine myometrium and  left ovary. The right ovary could not be visualized.  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 She's currently out of work on disability due to her breast cancer and chemotherapy. Her husband [name] also works for the [employer] system. Their children are [name], 8, and [place], 5. [name]'s mother currently lives in the home as well. The patient attends a [employer]   ADVANCED DIRECTIVES: Not in place   HEALTH MAINTENANCE: (Updated 05/21/2013) History  Substance Use Topics  . Smoking status: Never Smoker   . Smokeless tobacco: Never Used  . Alcohol Use: 1.2 oz/week    2 Glasses of wine per week     Comment: occassion     Colonoscopy:- Never  PAP: 2013, Dr. Nicholaus Bloom  Bone density: Never  Lipid panel: Dr. Linford Arnold    No Known Allergies  Current Outpatient Prescriptions  Medication Sig Dispense Refill  . hydrochlorothiazide (HYDRODIURIL) 25 MG tablet Take 25 mg by mouth daily after lunch.      Marland Kitchen HYDROcodone-acetaminophen (NORCO/VICODIN) 5-325 MG per tablet TAKE 1 OR 2 TABLETS BY MOUTH EVERY 6 HOURS AS NEEDED FOR PAIN  30 tablet  0  . levothyroxine (SYNTHROID, LEVOTHROID) 88 MCG tablet Take 88 mcg by mouth daily before breakfast.      . lidocaine-prilocaine (EMLA) cream Apply topically as needed. 1-2 hrs before each procedure  30 g  2  . prochlorperazine (COMPAZINE) 10 MG tablet Take one tablet 4 times a day (ac and hs)starting the evening of chemotherapy, and continue an additional 3 days  30 tablet  4  . tobramycin-dexamethasone (TOBRADEX) ophthalmic solution Place 1 drop into both eyes 2 (two) times daily.  5 mL  0   No current facility-administered medications for this visit.    OBJECTIVE: [name] in no acute distress    Filed Vitals:   07/09/13 1054  BP: 124/84  Pulse: 105  Temp: 98.9 F (37.2 C)  Resp: 18     Body mass index is 25.33 kg/(m^2).    ECOG FS:1 Filed  Weights   07/09/13 1054  Weight: 138 lb 8 oz (62.823 kg)   Physical Exam: HEENT:  Sclerae anicteric.  Oropharynx clear and moist. No ulcerations or candidiasis. Neck is supple. NODES:  No cervical or supraclavicular lymphadenopathy palpated.  BREAST EXAM:  Deferred. No axillary lymphadenopathy palpated. LUNGS:  Clear to auscultation bilaterally with good excursion..  No wheezes or rhonchi.   HEART:  Regular rate and rhythm. No murmur  ABDOMEN:  Soft, nontender to palpation.  Positive bowel sounds.  MSK:  No focal spinal tenderness to palpation. Good range of motion bilaterally in the upper extremities. No joint swelling. EXTREMITIES:  No peripheral edema.   SKIN:  Slight hyperpigmentation noted on the hands bilaterally, no significant nail dyscrasia.  No rashes and no cracking or peeling of the skin NEURO:  Nonfocal. Well oriented. Frustrated affect.     LAB RESULTS:  Lab Results  Component Value Date   WBC 3.3* 07/09/2013   NEUTROABS 2.0 07/09/2013   HGB 10.7* 07/09/2013   HCT 30.9* 07/09/2013   MCV 93.4 07/09/2013   PLT 349 07/09/2013  Chemistry      Component Value Date/Time   NA 144 07/09/2013 1034   NA 136 04/11/2013 0927   K 3.4* 07/09/2013 1034   K 3.9 04/11/2013 0927   CL 103 04/11/2013 0927   CO2 27 07/09/2013 1034   CO2 29 04/11/2013 0927   BUN 8.2 07/09/2013 1034   BUN 10 04/11/2013 0927   CREATININE 0.7 07/09/2013 1034   CREATININE 0.67 04/11/2013 0927   CREATININE 0.65 05/21/2009 2243      Component Value Date/Time   CALCIUM 10.0 07/09/2013 1034   CALCIUM 9.5 04/11/2013 0927   ALKPHOS 63 07/09/2013 1034   ALKPHOS 70 01/19/2013 0910   AST 36* 07/09/2013 1034   AST 21 01/19/2013 0910   ALT 57* 07/09/2013 1034   ALT 20 01/19/2013 0910   BILITOT 0.31 07/09/2013 1034   BILITOT 0.4 01/19/2013 0910        STUDIES: Mr Breast Bilateral W Wo Contrast  06/15/2013   CLINICAL DATA:  Biopsy-proven upper-outer quadrant left breast cancer. Question response to  neoadjuvant chemotherapy.  EXAM: MR BILATERAL BREAST WITHOUT AND WITH CONTRAST  LABS:  Not applicable  TECHNIQUE: Multiplanar, multisequence MR images of both breasts were obtained prior to and following the intravenous administration of 12ml of MultiHance.  THREE-DIMENSIONAL MR IMAGE RENDERING ON INDEPENDENT WORKSTATION:  Three-dimensional MR images were rendered by post-processing of the original MR data on an independent workstation. The three-dimensional MR images were interpreted, and findings are reported in the following complete MRI report for this study.  COMPARISON:  Previous exams  FINDINGS: Breast composition: c:  Heterogeneous fibroglandular tissue  Background parenchymal enhancement: Moderate  Right breast: No mass or abnormal enhancement.  Left breast: Interval decrease in size of irregular enhancing mass in the posterior upper outer left breast. The mass measures 1.7 x 0.9 x 1.4 cm compared with 2.5 x 1.9 x 2 cm previously. No additional areas of abnormal enhancement.  Lymph nodes: No abnormal appearing lymph nodes.  Ancillary findings:  None.  IMPRESSION: Interval positive response to therapy, with biopsy proven left breast carcinoma measuring 1.7 x 0.9 x 1.4 cm compared with 2.5 x 1.9 x 2 cm previously.  RECOMMENDATION: Treatment planning.  BI-RADS CATEGORY  6: Known biopsy-proven malignancy - appropriate action should be taken.   Electronically Signed   By: Jerene Dilling M.D.   On: 06/15/2013 13:42     ASSESSMENT: 47 y.o. [place] woman   (1)  status post left breast upper outer quadrant biopsy 04/06/2013 for a clinical T2 N1, stage IIB invasive ductal carcinoma, high-grade, triple negative, with an MIB-1 of 65%  (2)  being treated in the neoadjuvant setting,   (a) completed 4 dose dense cycles of doxorubicin/ cyclophosphamide 06/11/2013,.  (b)  0n 06/25/2013 starting 12 weekly cycles of carboplatin/ paclitaxel, to be followed by definitive surgery   PLAN: The majority of our  25 minute appointment today was spent reviewing Kimie's concerns, discussing her treatment plan, and coordinating care.  Evanne like to hold treatment today, but will return next week as scheduled on December 22 for her third dose of weekly carboplatin/paclitaxel, and we hope to resume her regular treatment schedule at that time. She feels like this extra week off will give her time to participate in her children's school activities this week, and then she will be able to rest over the actual Christmas holiday.   We discussed the possibility of treatment for depression and/or anxiety, but she tells me she really doesn't feel like  this is a problem. She is simply "too tired". We also discussed her need to take care of herself, and perhaps "delegate" some of her responsibilities to other family members.   I will see Seleste again next week as noted above, and she is  scheduled to see me weekly for the next several weeks. I will continue to follow her very closely, and she knows to call with any changes or problems prior to her next appointment.    Zollie Scale, PA-C   07/09/2013 4:33 PM

## 2013-07-13 ENCOUNTER — Telehealth: Payer: Self-pay | Admitting: *Deleted

## 2013-07-13 NOTE — Telephone Encounter (Signed)
Returned pt's phone call. Pt states," I am having chills, sore throat and coughing". Having no fever,Tmax is 99.0.Wanted to know if she should continue on antibiotics prescribed by Amy Berry,PA-C. She was advised by nurse to take antibiotics and monitor temp. If condition worsens, go to [facility] urgent care or [facility]- ED. Pt verbalized understanding. No further concerns.

## 2013-07-16 ENCOUNTER — Ambulatory Visit (HOSPITAL_BASED_OUTPATIENT_CLINIC_OR_DEPARTMENT_OTHER): Payer: BC Managed Care – PPO | Admitting: Physician Assistant

## 2013-07-16 ENCOUNTER — Telehealth: Payer: Self-pay | Admitting: Physician Assistant

## 2013-07-16 ENCOUNTER — Encounter: Payer: Self-pay | Admitting: Oncology

## 2013-07-16 ENCOUNTER — Other Ambulatory Visit (HOSPITAL_BASED_OUTPATIENT_CLINIC_OR_DEPARTMENT_OTHER): Payer: BC Managed Care – PPO

## 2013-07-16 ENCOUNTER — Telehealth: Payer: Self-pay | Admitting: *Deleted

## 2013-07-16 ENCOUNTER — Encounter: Payer: Self-pay | Admitting: Physician Assistant

## 2013-07-16 ENCOUNTER — Other Ambulatory Visit: Payer: Self-pay | Admitting: Oncology

## 2013-07-16 ENCOUNTER — Ambulatory Visit (HOSPITAL_BASED_OUTPATIENT_CLINIC_OR_DEPARTMENT_OTHER): Payer: BC Managed Care – PPO

## 2013-07-16 VITALS — BP 111/81 | HR 103 | Temp 98.7°F | Resp 18 | Ht 62.0 in | Wt 136.0 lb

## 2013-07-16 DIAGNOSIS — C50412 Malignant neoplasm of upper-outer quadrant of left female breast: Secondary | ICD-10-CM

## 2013-07-16 DIAGNOSIS — C50419 Malignant neoplasm of upper-outer quadrant of unspecified female breast: Secondary | ICD-10-CM

## 2013-07-16 DIAGNOSIS — J3489 Other specified disorders of nose and nasal sinuses: Secondary | ICD-10-CM

## 2013-07-16 DIAGNOSIS — Z170 Estrogen receptor positive status [ER+]: Secondary | ICD-10-CM

## 2013-07-16 DIAGNOSIS — D649 Anemia, unspecified: Secondary | ICD-10-CM

## 2013-07-16 DIAGNOSIS — R05 Cough: Secondary | ICD-10-CM

## 2013-07-16 DIAGNOSIS — Z5111 Encounter for antineoplastic chemotherapy: Secondary | ICD-10-CM

## 2013-07-16 LAB — CBC WITH DIFFERENTIAL/PLATELET
Basophils Absolute: 0 10*3/uL (ref 0.0–0.1)
Eosinophils Absolute: 0.1 10*3/uL (ref 0.0–0.5)
HGB: 11.2 g/dL — ABNORMAL LOW (ref 11.6–15.9)
LYMPH%: 25.7 % (ref 14.0–49.7)
MCHC: 34.3 g/dL (ref 31.5–36.0)
MONO#: 0.7 10*3/uL (ref 0.1–0.9)
NEUT#: 2.2 10*3/uL (ref 1.5–6.5)
Platelets: 287 10*3/uL (ref 145–400)
RBC: 3.55 10*6/uL — ABNORMAL LOW (ref 3.70–5.45)
RDW: 16.6 % — ABNORMAL HIGH (ref 11.2–14.5)
WBC: 4.1 10*3/uL (ref 3.9–10.3)
nRBC: 0 % (ref 0–0)

## 2013-07-16 MED ORDER — SODIUM CHLORIDE 0.9 % IV SOLN
Freq: Once | INTRAVENOUS | Status: AC
  Administered 2013-07-16: 13:00:00 via INTRAVENOUS

## 2013-07-16 MED ORDER — PACLITAXEL CHEMO INJECTION 300 MG/50ML
80.0000 mg/m2 | Freq: Once | INTRAVENOUS | Status: AC
  Administered 2013-07-16: 132 mg via INTRAVENOUS
  Filled 2013-07-16: qty 22

## 2013-07-16 MED ORDER — SODIUM CHLORIDE 0.9 % IV SOLN
245.6000 mg | Freq: Once | INTRAVENOUS | Status: AC
  Administered 2013-07-16: 250 mg via INTRAVENOUS
  Filled 2013-07-16: qty 25

## 2013-07-16 MED ORDER — ONDANSETRON HCL 8 MG PO TABS: 8.0000 mg | ORAL_TABLET | Freq: Two times a day (BID) | ORAL | Status: DC | PRN

## 2013-07-16 MED ORDER — SODIUM CHLORIDE 0.9 % IJ SOLN
10.0000 mL | INTRAMUSCULAR | Status: DC | PRN
  Administered 2013-07-16: 10 mL
  Filled 2013-07-16: qty 10

## 2013-07-16 MED ORDER — ONDANSETRON 16 MG/50ML IVPB (CHCC)
INTRAVENOUS | Status: AC
  Filled 2013-07-16: qty 16

## 2013-07-16 MED ORDER — FAMOTIDINE IN NACL 20-0.9 MG/50ML-% IV SOLN
20.0000 mg | Freq: Once | INTRAVENOUS | Status: AC
  Administered 2013-07-16: 20 mg via INTRAVENOUS

## 2013-07-16 MED ORDER — HEPARIN SOD (PORK) LOCK FLUSH 100 UNIT/ML IV SOLN
500.0000 [IU] | Freq: Once | INTRAVENOUS | Status: AC | PRN
  Administered 2013-07-16: 500 [IU]
  Filled 2013-07-16: qty 5

## 2013-07-16 MED ORDER — ONDANSETRON 16 MG/50ML IVPB (CHCC)
16.0000 mg | Freq: Once | INTRAVENOUS | Status: AC
  Administered 2013-07-16: 16 mg via INTRAVENOUS

## 2013-07-16 MED ORDER — FAMOTIDINE IN NACL 20-0.9 MG/50ML-% IV SOLN
INTRAVENOUS | Status: AC
  Filled 2013-07-16: qty 50

## 2013-07-16 MED ORDER — DIPHENHYDRAMINE HCL 50 MG/ML IJ SOLN
INTRAMUSCULAR | Status: AC
  Filled 2013-07-16: qty 1

## 2013-07-16 MED ORDER — DEXAMETHASONE SODIUM PHOSPHATE 20 MG/5ML IJ SOLN
20.0000 mg | Freq: Once | INTRAMUSCULAR | Status: AC
  Administered 2013-07-16: 20 mg via INTRAVENOUS

## 2013-07-16 MED ORDER — DEXAMETHASONE SODIUM PHOSPHATE 20 MG/5ML IJ SOLN
INTRAMUSCULAR | Status: AC
  Filled 2013-07-16: qty 5

## 2013-07-16 MED ORDER — DIPHENHYDRAMINE HCL 50 MG/ML IJ SOLN
50.0000 mg | Freq: Once | INTRAMUSCULAR | Status: AC
  Administered 2013-07-16: 50 mg via INTRAVENOUS

## 2013-07-16 NOTE — Patient Instructions (Signed)
[facility] Discharge Instructions for Patients Receiving Chemotherapy  Today you received the following chemotherapy agents Taxol/Carboplatin To help prevent nausea and vomiting after your treatment, we encourage you to take your nausea medication as prescribed.  If you develop nausea and vomiting that is not controlled by your nausea medication, call the clinic.   BELOW ARE SYMPTOMS THAT SHOULD BE REPORTED IMMEDIATELY:  *FEVER GREATER THAN 100.5 F  *CHILLS WITH OR WITHOUT FEVER  NAUSEA AND VOMITING THAT IS NOT CONTROLLED WITH YOUR NAUSEA MEDICATION  *UNUSUAL SHORTNESS OF BREATH  *UNUSUAL BRUISING OR BLEEDING  TENDERNESS IN MOUTH AND THROAT WITH OR WITHOUT PRESENCE OF ULCERS  *URINARY PROBLEMS  *BOWEL PROBLEMS  UNUSUAL RASH Items with * indicate a potential emergency and should be followed up as soon as possible.  Feel free to call the clinic you have any questions or concerns. The clinic phone number is [phone].    

## 2013-07-16 NOTE — Progress Notes (Signed)
[name] OB: 10/16/1965  MR#: [id number]  NWG#:[id number]  PCP: Nani Gasser, MD GYN:  Grant Ruts SU: Avel Peace OTHER MD: Hal Neer  CHIEF COMPLAINT:  Left Breast Cancer, neoadjuvant chemotherapy  HISTORY OF PRESENT ILLNESS: [name] had routine screening mammography 03/09/2013 showing heterogeneously dense breasts, and a possible asymmetry in the left breast. Diagnostic left mammography and ultrasonography at the breast Center 03/30/2013 found a lobulated mass in the left breast upper outer quadrant measuring 1.6 cm. This was palpable. Ultrasound showed a complex microlobulated hypoechoic mass measuring 1.8 cm. There was no left axillary lymphadenopathy noted.  Biopsy of the mass in question 04/06/2013 showed ([id number]) and invasive ductal carcinoma, grade 3, triple negative, with an MIB-1 of 65%.  Bilateral breast MRI 04/13/2013 found an irregular mass measuring 2.5 cm in the left breast with a few lymph nodes that showed moderate cortical thickness, the largest measuring 12 mm (level I).  The patient's subsequent history is as detailed below  INTERVAL HISTORY: Brittney Vance returns alone today  for followup of her locally advanced left breast cancer. She is being treated neoadjuvantly, and is due for her third of 12 planned weekly doses of carboplatin/paclitaxel, treatment having been held one week ago per patient request.  Lorilynn is feeling better this week, both physically and emotionally she tells me.  Last week she was feeling a little "overwhelmed" and tells me the week off made a significant difference for her. She is ready to resume treatment today as scheduled, her third weekly dose of carboplatin/paclitaxel.  Over the past week, Natasa did have a mild upper respiratory infection, likely a virus, but consisted of sinus congestion, or sore throat, and cough. This has continued to improve. She's taking some Alka-Seltzer cold medication and Mucinex. She still has a cough,  primarily at night, productive of clear phlegm. She's had no fevers of 100 or above, and denies any recent chills or night sweats.  REVIEW OF SYSTEMS: [name]'s energy level is still low, but is slightly improved compared to last week. She is denies any rashes, abnormal bruising or bleeding.  Her appetite is fair. She's had no nausea or emesis.  her bowels have returned to normal, and she denies constipation or diarrhea. She denies any mouth ulcers or oral sensitivity. She's had no change in bladder habits. She denies any  increased shortness of breath and has had no chest pain, or palpitations. She's had no abnormal headaches or dizziness, and denies any unusual myalgias, arthralgias, bony pain, or peripheral swelling.  She denies any signs of peripheral neuropathy.   A detailed review of systems is otherwise stable and noncontributory.   PAST MEDICAL HISTORY: Past Medical History  Diagnosis Date  . Fibroids   . Hypertension   . Hypothyroid   . Breast cancer     PAST SURGICAL HISTORY: Past Surgical History  Procedure Laterality Date  . Tubal ligation    . Portacath placement Right 04/27/2013    Procedure: ULTRASOUND GUIDED PORT INSERTION WITH FLUOROSCOPY;  Surgeon: Adolph Pollack, MD;  Location: [facility];  Service: General;  Laterality: Right;    FAMILY HISTORY Family History  Problem Relation Age of Onset  . Hypertension Mother   . Diabetes Mother   . Alzheimer's disease Mother   . Hypertension Father   . Diabetes Father   . Hypertension Brother    the patient's father died in his 52s from complications of diabetes. The patient's mother is living, in her mid 1s. The patient had 2 brothers,  and 2 sisters. There is no history of cancer in the family to her knowledge.  GYNECOLOGIC HISTORY:  Menarche age 16, first live birth age 89. The patient is GX P2. She is postmenopausal, but did have some perimenopausal bleeding, which was evaluated age 38 08/14/2012 with a transabdominal and  transvaginal ultrasound of the pelvis which found a normal uterine myometrium and left ovary. The right ovary could not be visualized.  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 Her husband [name] also works for the [employer] system. Their children are [name], 8, and [place], 5. Amellia's mother currently lives in the home as well. The patient attends a [employer]   ADVANCED DIRECTIVES: Not in place   HEALTH MAINTENANCE: (Updated 05/21/2013) History  Substance Use Topics  . Smoking status: Never Smoker   . Smokeless tobacco: Never Used  . Alcohol Use: 1.2 oz/week    2 Glasses of wine per week     Comment: occassion     Colonoscopy:- Never  PAP: 2013, Dr. Nicholaus Bloom  Bone density: Never  Lipid panel: Dr. Linford Arnold    No Known Allergies  Current Outpatient Prescriptions  Medication Sig Dispense Refill  . hydrochlorothiazide (HYDRODIURIL) 25 MG tablet Take 25 mg by mouth daily after lunch.      . levothyroxine (SYNTHROID, LEVOTHROID) 88 MCG tablet Take 88 mcg by mouth daily before breakfast.      . lidocaine-prilocaine (EMLA) cream Apply topically as needed. 1-2 hrs before each procedure  30 g  2  . HYDROcodone-acetaminophen (NORCO/VICODIN) 5-325 MG per tablet TAKE 1 OR 2 TABLETS BY MOUTH EVERY 6 HOURS AS NEEDED FOR PAIN  30 tablet  0  . ondansetron (ZOFRAN) 8 MG tablet Take 1 tablet (8 mg total) by mouth 2 (two) times daily as needed for nausea or vomiting.  20 tablet  1  . prochlorperazine (COMPAZINE) 10 MG tablet Take one tablet 4 times a day (ac and hs)starting the evening of chemotherapy, and continue an additional 3 days  30 tablet  2  . tobramycin-dexamethasone (TOBRADEX) ophthalmic solution Place 1 drop into both eyes 2 (two) times daily.  5 mL  0   No current facility-administered medications for this visit.     OBJECTIVE: [name] in no acute distress    Filed Vitals:   07/16/13 1106  BP: 111/81  Pulse: 103  Temp: 98.7 F (37.1 C)  Resp: 18     Body mass index is 24.87 kg/(m^2).    ECOG FS:1 Filed Weights   07/16/13 1106  Weight: 136 lb (61.689 kg)   Physical Exam: HEENT:  Sclerae anicteric.  Oropharynx clear and moist. No ulcerations or candidiasis.  No pharyngeal erythema. Neck is supple. NODES:  No cervical or supraclavicular lymphadenopathy palpated.  BREAST EXAM:   right breast tissue is dense, but otherwise benign. Unable to palpate a distinct mass in the left breast today. No suspicious skin changes, no nipple inversion, no nipple discharge noted. No axillary lymphadenopathy palpated. LUNGS:  Clear to auscultation bilaterally with good excursion.  No wheezes or rhonchi.   HEART:  Regular rate and rhythm.   ABDOMEN:  Soft, nontender to palpation.  Positive, normoactive  bowel sounds.  MSK:  No focal spinal tenderness to palpation. Good range of motion bilaterally in the upper extremities.  EXTREMITIES:  No peripheral edema.  SKIN:  Slight hyperpigmentation noted on the hands bilaterally, no significant nail dyscrasia.   the skin is very dry diffusely. No  additional rashes and no cracking or peeling of the skin.  Port is intact in the right upper chest wall with no erythema and no evidence of infection/cellulitis.  NEURO:  Nonfocal. Well oriented. Fatigued affect.    LAB RESULTS:  Lab Results  Component Value Date   WBC 4.1 07/16/2013   NEUTROABS 2.2 07/16/2013   HGB 11.2* 07/16/2013   HCT 32.7* 07/16/2013   MCV 92.1 07/16/2013   PLT 287 07/16/2013      Chemistry      Component Value Date/Time   NA 144 07/09/2013 1034   NA 136 04/11/2013 0927   K 3.4* 07/09/2013 1034   K 3.9 04/11/2013 0927   CL 103 04/11/2013 0927   CO2 27 07/09/2013 1034   CO2 29 04/11/2013 0927   BUN 8.2 07/09/2013 1034   BUN 10 04/11/2013 0927   CREATININE 0.7 07/09/2013  1034   CREATININE 0.67 04/11/2013 0927   CREATININE 0.65 05/21/2009 2243      Component Value Date/Time   CALCIUM 10.0 07/09/2013 1034   CALCIUM 9.5 04/11/2013 0927   ALKPHOS 63 07/09/2013 1034   ALKPHOS 70 01/19/2013 0910   AST 36* 07/09/2013 1034   AST 21 01/19/2013 0910   ALT 57* 07/09/2013 1034   ALT 20 01/19/2013 0910   BILITOT 0.31 07/09/2013 1034   BILITOT 0.4 01/19/2013 0910        STUDIES: Mr Breast Bilateral W Wo Contrast  06/15/2013   CLINICAL DATA:  Biopsy-proven upper-outer quadrant left breast cancer. Question response to neoadjuvant chemotherapy.  EXAM: MR BILATERAL BREAST WITHOUT AND WITH CONTRAST  LABS:  Not applicable  TECHNIQUE: Multiplanar, multisequence MR images of both breasts were obtained prior to and following the intravenous administration of 12ml of MultiHance.  THREE-DIMENSIONAL MR IMAGE RENDERING ON INDEPENDENT WORKSTATION:  Three-dimensional MR images were rendered by post-processing of the original MR data on an independent workstation. The three-dimensional MR images were interpreted, and findings are reported in the following complete MRI report for this study.  COMPARISON:  Previous exams  FINDINGS: Breast composition: c:  Heterogeneous fibroglandular tissue  Background parenchymal enhancement: Moderate  Right breast: No mass or abnormal enhancement.  Left breast: Interval decrease in size of irregular enhancing mass in the posterior upper outer left breast. The mass measures 1.7 x 0.9 x 1.4 cm compared with 2.5 x 1.9 x 2 cm previously. No additional areas of abnormal enhancement.  Lymph nodes: No abnormal appearing lymph nodes.  Ancillary findings:  None.  IMPRESSION: Interval positive response to therapy, with biopsy proven left breast carcinoma measuring 1.7 x 0.9 x 1.4 cm compared with 2.5 x 1.9 x 2 cm previously.  RECOMMENDATION: Treatment planning.  BI-RADS CATEGORY  6: Known biopsy-proven malignancy - appropriate action should be taken.   Electronically  Signed   By: Jerene Dilling M.D.   On: 06/15/2013 13:42     ASSESSMENT: 47 y.o. [place] woman   (1)  status post left breast upper outer quadrant biopsy 04/06/2013 for a clinical T2 N1, stage IIB invasive ductal carcinoma, high-grade, triple negative, with an MIB-1 of 65%  (2)  being treated in the neoadjuvant setting,   (a) completed 4 dose dense cycles of doxorubicin/ cyclophosphamide 06/11/2013,.  (b)  0n 06/25/2013 starting 12 weekly cycles of carboplatin/ paclitaxel, to be followed by definitive surgery    PLAN: Juliannah will proceed  to treatment today as scheduled for her third weekly dose of neoadjuvant carboplatin/paclitaxel. We'll continue to follow her closely, she'll be seen on a weekly basis, with her next appointment scheduled for her next Monday, December 29.  She's been doing extremely well with regards to her nausea prevention. She'll take no additional dexamethasone following treatment, but has both prochlorperazine and ondansetron to take for the first 2 days following each dose of chemotherapy.  With regards to her sinus congestion, I have encouraged her to continue taking Mucinex, and she can use Robitussin-DM at night for the cough. Also encouraged her to drink plenty of fluids, especially water, and to contact us with any fevers 100 or above.  This plan was reviewed in detail with Brittney Vance today, and she was also given all of the above information in writing. She knows to call with any changes or problems prior to her next scheduled appointment.    Malania Gawthrop, PA-C   07/16/2013 12:28 PM

## 2013-07-16 NOTE — Telephone Encounter (Signed)
, °

## 2013-07-16 NOTE — Telephone Encounter (Signed)
Per staff message and POF I have scheduled appts.  JMW  

## 2013-07-23 ENCOUNTER — Ambulatory Visit (HOSPITAL_BASED_OUTPATIENT_CLINIC_OR_DEPARTMENT_OTHER): Payer: BC Managed Care – PPO | Admitting: Physician Assistant

## 2013-07-23 ENCOUNTER — Telehealth: Payer: Self-pay | Admitting: *Deleted

## 2013-07-23 ENCOUNTER — Ambulatory Visit (HOSPITAL_BASED_OUTPATIENT_CLINIC_OR_DEPARTMENT_OTHER): Payer: BC Managed Care – PPO

## 2013-07-23 ENCOUNTER — Other Ambulatory Visit (HOSPITAL_BASED_OUTPATIENT_CLINIC_OR_DEPARTMENT_OTHER): Payer: BC Managed Care – PPO

## 2013-07-23 ENCOUNTER — Telehealth: Payer: Self-pay | Admitting: Oncology

## 2013-07-23 ENCOUNTER — Encounter: Payer: Self-pay | Admitting: Physician Assistant

## 2013-07-23 VITALS — BP 128/90 | HR 109 | Temp 98.9°F | Resp 20 | Ht 62.0 in | Wt 138.1 lb

## 2013-07-23 DIAGNOSIS — D649 Anemia, unspecified: Secondary | ICD-10-CM

## 2013-07-23 DIAGNOSIS — C50412 Malignant neoplasm of upper-outer quadrant of left female breast: Secondary | ICD-10-CM

## 2013-07-23 DIAGNOSIS — C50419 Malignant neoplasm of upper-outer quadrant of unspecified female breast: Secondary | ICD-10-CM

## 2013-07-23 DIAGNOSIS — Z171 Estrogen receptor negative status [ER-]: Secondary | ICD-10-CM

## 2013-07-23 LAB — COMPREHENSIVE METABOLIC PANEL (CC13)
ALT: 72 U/L — ABNORMAL HIGH (ref 0–55)
AST: 43 U/L — ABNORMAL HIGH (ref 5–34)
Albumin: 3.9 g/dL (ref 3.5–5.0)
Alkaline Phosphatase: 77 U/L (ref 40–150)
BUN: 8 mg/dL (ref 7.0–26.0)
Calcium: 9.5 mg/dL (ref 8.4–10.4)
Chloride: 104 mEq/L (ref 98–109)
Potassium: 3.7 mEq/L (ref 3.5–5.1)
Sodium: 141 mEq/L (ref 136–145)
Total Bilirubin: 0.35 mg/dL (ref 0.20–1.20)
Total Protein: 7.3 g/dL (ref 6.4–8.3)

## 2013-07-23 LAB — CBC WITH DIFFERENTIAL/PLATELET
BASO%: 1.2 % (ref 0.0–2.0)
Basophils Absolute: 0 10*3/uL (ref 0.0–0.1)
EOS%: 2.6 % (ref 0.0–7.0)
Eosinophils Absolute: 0.1 10*3/uL (ref 0.0–0.5)
HGB: 11.3 g/dL — ABNORMAL LOW (ref 11.6–15.9)
MCH: 31.2 pg (ref 25.1–34.0)
MCHC: 34.1 g/dL (ref 31.5–36.0)
MCV: 91.4 fL (ref 79.5–101.0)
MONO%: 7.1 % (ref 0.0–14.0)
RBC: 3.62 10*6/uL — ABNORMAL LOW (ref 3.70–5.45)
RDW: 15.5 % — ABNORMAL HIGH (ref 11.2–14.5)
lymph#: 1.1 10*3/uL (ref 0.9–3.3)

## 2013-07-23 MED ORDER — DIPHENHYDRAMINE HCL 50 MG/ML IJ SOLN
50.0000 mg | Freq: Once | INTRAMUSCULAR | Status: AC
  Administered 2013-07-23: 50 mg via INTRAVENOUS

## 2013-07-23 MED ORDER — FAMOTIDINE IN NACL 20-0.9 MG/50ML-% IV SOLN
INTRAVENOUS | Status: AC
  Filled 2013-07-23: qty 50

## 2013-07-23 MED ORDER — SODIUM CHLORIDE 0.9 % IJ SOLN
10.0000 mL | INTRAMUSCULAR | Status: DC | PRN
  Administered 2013-07-23: 10 mL
  Filled 2013-07-23: qty 10

## 2013-07-23 MED ORDER — FAMOTIDINE IN NACL 20-0.9 MG/50ML-% IV SOLN
20.0000 mg | Freq: Once | INTRAVENOUS | Status: AC
  Administered 2013-07-23: 20 mg via INTRAVENOUS

## 2013-07-23 MED ORDER — ONDANSETRON 16 MG/50ML IVPB (CHCC)
INTRAVENOUS | Status: AC
  Filled 2013-07-23: qty 16

## 2013-07-23 MED ORDER — ONDANSETRON 16 MG/50ML IVPB (CHCC)
16.0000 mg | Freq: Once | INTRAVENOUS | Status: AC
  Administered 2013-07-23: 16 mg via INTRAVENOUS

## 2013-07-23 MED ORDER — SODIUM CHLORIDE 0.9 % IV SOLN
Freq: Once | INTRAVENOUS | Status: AC
  Administered 2013-07-23: 12:00:00 via INTRAVENOUS

## 2013-07-23 MED ORDER — HEPARIN SOD (PORK) LOCK FLUSH 100 UNIT/ML IV SOLN
500.0000 [IU] | Freq: Once | INTRAVENOUS | Status: AC | PRN
  Administered 2013-07-23: 500 [IU]
  Filled 2013-07-23: qty 5

## 2013-07-23 MED ORDER — DEXAMETHASONE SODIUM PHOSPHATE 20 MG/5ML IJ SOLN
INTRAMUSCULAR | Status: AC
  Filled 2013-07-23: qty 5

## 2013-07-23 MED ORDER — DEXAMETHASONE SODIUM PHOSPHATE 20 MG/5ML IJ SOLN
20.0000 mg | Freq: Once | INTRAMUSCULAR | Status: AC
  Administered 2013-07-23: 20 mg via INTRAVENOUS

## 2013-07-23 MED ORDER — DIPHENHYDRAMINE HCL 50 MG/ML IJ SOLN
INTRAMUSCULAR | Status: AC
  Filled 2013-07-23: qty 1

## 2013-07-23 MED ORDER — SODIUM CHLORIDE 0.9 % IV SOLN
250.0000 mg | Freq: Once | INTRAVENOUS | Status: AC
  Administered 2013-07-23: 250 mg via INTRAVENOUS
  Filled 2013-07-23: qty 25

## 2013-07-23 MED ORDER — SODIUM CHLORIDE 0.9 % IV SOLN
80.0000 mg/m2 | Freq: Once | INTRAVENOUS | Status: AC
  Administered 2013-07-23: 132 mg via INTRAVENOUS
  Filled 2013-07-23: qty 22

## 2013-07-23 NOTE — Patient Instructions (Signed)
[facility] Discharge Instructions for Patients Receiving Chemotherapy  Today you received the following chemotherapy agents: Taxol, Carboplatin  To help prevent nausea and vomiting after your treatment, we encourage you to take your nausea medication as prescribed.    If you develop nausea and vomiting that is not controlled by your nausea medication, call the clinic.   BELOW ARE SYMPTOMS THAT SHOULD BE REPORTED IMMEDIATELY:  *FEVER GREATER THAN 100.5 F  *CHILLS WITH OR WITHOUT FEVER  NAUSEA AND VOMITING THAT IS NOT CONTROLLED WITH YOUR NAUSEA MEDICATION  *UNUSUAL SHORTNESS OF BREATH  *UNUSUAL BRUISING OR BLEEDING  TENDERNESS IN MOUTH AND THROAT WITH OR WITHOUT PRESENCE OF ULCERS  *URINARY PROBLEMS  *BOWEL PROBLEMS  UNUSUAL RASH Items with * indicate a potential emergency and should be followed up as soon as possible.  Feel free to call the clinic you have any questions or concerns. The clinic phone number is [phone].    

## 2013-07-23 NOTE — Progress Notes (Signed)
[name] OB: 03/23/1966  MR#: [id number]  NWG#:[id number]  PCP: Nani Gasser, MD GYN:  Grant Ruts SU: Avel Peace OTHER MD: Hal Neer  CHIEF COMPLAINT:  Left Breast Cancer, neoadjuvant chemotherapy  HISTORY OF PRESENT ILLNESS: Brittney Vance had routine screening mammography 03/09/2013 showing heterogeneously dense breasts, and a possible asymmetry in the left breast. Diagnostic left mammography and ultrasonography at the breast Center 03/30/2013 found a lobulated mass in the left breast upper outer quadrant measuring 1.6 cm. This was palpable. Ultrasound showed a complex microlobulated hypoechoic mass measuring 1.8 cm. There was no left axillary lymphadenopathy noted.  Biopsy of the mass in question 04/06/2013 showed ([id number]) and invasive ductal carcinoma, grade 3, triple negative, with an MIB-1 of 65%.  Bilateral breast MRI 04/13/2013 found an irregular mass measuring 2.5 cm in the left breast with a few lymph nodes that showed moderate cortical thickness, the largest measuring 12 mm (level I).  The patient's subsequent history is as detailed below  INTERVAL HISTORY: Brittney Vance returns alone today  for followup of her locally advanced left breast cancer. She is being treated neoadjuvantly, and is due for her 4th of 12 planned weekly doses of carboplatin/paclitaxel today.  Brittney Vance is feeling "okay". She had a good Christmas. She continues to complain of fatigue, and her biggest complaint today is taste aversion. She is trying to eating keep herself well hydrated, and fortunately denies any problems with nausea or emesis. She has a cough occasionally productive of clear phlegm. She denies any pleurisy, shortness of breath, orthopnea, chest pain, or palpitations. She also denies any fevers or chills.  Brittney Vance appears very frustrated on presentation today. Although she is actually tolerated this regimen quite well, she is ready to have all of this behind her, and is ready to get back to  normal.   REVIEW OF SYSTEMS: Brittney Vance denies any rashes, abnormal bruising or bleeding.  She denies any current problems with constipation or diarrhea, and has had no change in urinary habits. She denies any mouth ulcers, oral sensitivity or problems swallowing.  She's had no abnormal headaches or dizziness, and denies any unusual myalgias, arthralgias, bony pain, or peripheral swelling.  She denies any signs of peripheral neuropathy. Her nail beds are still hyperpigmented as is her skin, but there is no tenderness in the nailbeds, no drainage, no evidence of infection.  A detailed review of systems is otherwise stable and noncontributory.   PAST MEDICAL HISTORY: Past Medical History  Diagnosis Date  . Fibroids   . Hypertension   . Hypothyroid   . Breast cancer     PAST SURGICAL HISTORY: Past Surgical History  Procedure Laterality Date  . Tubal ligation    . Portacath placement Right 04/27/2013    Procedure: ULTRASOUND GUIDED PORT INSERTION WITH FLUOROSCOPY;  Surgeon: Adolph Pollack, MD;  Location: [facility];  Service: General;  Laterality: Right;    FAMILY HISTORY Family History  Problem Relation Age of Onset  . Hypertension Mother   . Diabetes Mother   . Alzheimer's disease Mother   . Hypertension Father   . Diabetes Father   . Hypertension Brother    the patient's father died in his 25s from complications of diabetes. The patient's mother is living, in her mid 51s. The patient had 2 brothers, and 2 sisters. There is no history of cancer in the family to her knowledge.  GYNECOLOGIC HISTORY:  Menarche age 47, first live birth age 24. The patient is GX P2. She is postmenopausal, but did have  some perimenopausal bleeding, which was evaluated age 59 08/14/2012 with a transabdominal and transvaginal ultrasound of the pelvis which found a normal uterine myometrium and left ovary. The right ovary could not be visualized.  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 She's currently out of work on disability due to her breast cancer and chemotherapy. Her husband Brittney Vance also works for the [employer] system. Their children are Brittney Vance, 8, and Brittney Vance, 5. Brittney Vance's mother currently lives in the home as well. The patient attends a [employer]   ADVANCED DIRECTIVES: Not in place   HEALTH MAINTENANCE: (Updated 07/23/2013) History  Substance Use Topics  . Smoking status: Never Smoker   . Smokeless tobacco: Never Used  . Alcohol Use: 1.2 oz/week    2 Glasses of wine per week     Comment: occassion     Colonoscopy:- Never  PAP: 2013, Dr. Nicholaus Bloom  Bone density: Never  Lipid panel: Dr. Linford Arnold    No Known Allergies  Current Outpatient Prescriptions  Medication Sig Dispense Refill  . hydrochlorothiazide (HYDRODIURIL) 25 MG tablet Take 25 mg by mouth daily after lunch.      . levothyroxine (SYNTHROID, LEVOTHROID) 88 MCG tablet Take 88 mcg by mouth daily before breakfast.      . lidocaine-prilocaine (EMLA) cream Apply topically as needed. 1-2 hrs before each procedure  30 g  2  . prochlorperazine (COMPAZINE) 10 MG tablet Take one tablet 4 times a day (ac and hs)starting the evening of chemotherapy, and continue an additional 3 days  30 tablet  2  . HYDROcodone-acetaminophen (NORCO/VICODIN) 5-325 MG per tablet TAKE 1 OR 2 TABLETS BY MOUTH EVERY 6 HOURS AS NEEDED FOR PAIN  30 tablet  0  . ondansetron (ZOFRAN) 8 MG tablet Take 1 tablet (8 mg total) by mouth 2 (two) times daily as needed for nausea or vomiting.  20 tablet  1  . tobramycin-dexamethasone (TOBRADEX) ophthalmic solution Place 1 drop into both eyes 2 (two) times daily.  5 mL  0   No current facility-administered medications for this visit.   Facility-Administered Medications Ordered in Other Visits  Medication Dose Route Frequency Provider Last Rate Last Dose  . CARBOplatin (PARAPLATIN) 250 mg in sodium chloride 0.9 % 100 mL chemo  infusion  250 mg Intravenous Once Sherril Shipman G Piera Downs, PA-C      . heparin lock flush 100 unit/mL  500 Units Intracatheter Once PRN Clayden Withem [name], PA-C      . PACLitaxel (TAXOL) 132 mg in sodium chloride 0.9 % 250 mL chemo infusion (</= 80mg /m2)  80 mg/m2 (Treatment Plan Actual) Intravenous Once Catalina Gravel, PA-C 272 mL/hr at 07/23/13 1306 132 mg at 07/23/13 1306  . sodium chloride 0.9 % injection 10 mL  10 mL Intracatheter PRN Kellyjo Edgren [name], PA-C        OBJECTIVE: [name] in no acute distress    Filed Vitals:   07/23/13 1052  BP: 128/90  Pulse: 109  Temp: 98.9 F (37.2 C)  Resp: 20     Body mass index is 25.25 kg/(m^2).    ECOG FS:1 Filed Weights   07/23/13 1052  Weight: 138 lb 1.6 oz (62.642 kg)   Physical Exam: HEENT:  Sclerae anicteric.  Oropharynx clear. No ulcerations or evidence of oropharyngeal candidiasis.   Neck is supple. NODES:  No cervical or supraclavicular lymphadenopathy palpated.  BREAST EXAM:   Deferred. No murmur. No  axillary lymphadenopathy palpated. LUNGS:  Clear to auscultation bilaterally with good excursion.  No wheezes or rhonchi.   HEART:  Regular rate and rhythm.   ABDOMEN:  Soft, nondistended, nontender to palpation.  Positive bowel sounds.  MSK:  No focal spinal tenderness to palpation. Good range of motion bilaterally in the upper extremities.  EXTREMITIES:  No peripheral edema.   SKIN:  Hyperpigmentation noted on the palms of the hands bilaterally, with hyperpigmentation of the nailbeds as well. No loosening of the nails from the nailbeds, no drainage, no evidence of infection.   Skin is dry.  No  additional rashes and no cracking or peeling of the skin.  Port is intact in the right upper chest wall with no erythema and no evidence of infection/cellulitis.  NEURO:  Nonfocal. Well oriented. Frustrated affect.    LAB RESULTS:  Lab Results  Component Value Date   WBC 3.4* 07/23/2013   NEUTROABS 2.0 07/23/2013   HGB 11.3* 07/23/2013   HCT  33.1* 07/23/2013   MCV 91.4 07/23/2013   PLT 239 07/23/2013      Chemistry      Component Value Date/Time   NA 141 07/23/2013 1022   NA 136 04/11/2013 0927   K 3.7 07/23/2013 1022   K 3.9 04/11/2013 0927   CL 103 04/11/2013 0927   CO2 29 07/23/2013 1022   CO2 29 04/11/2013 0927   BUN 8.0 07/23/2013 1022   BUN 10 04/11/2013 0927   CREATININE 0.6 07/23/2013 1022   CREATININE 0.67 04/11/2013 0927   CREATININE 0.65 05/21/2009 2243      Component Value Date/Time   CALCIUM 9.5 07/23/2013 1022   CALCIUM 9.5 04/11/2013 0927   ALKPHOS 77 07/23/2013 1022   ALKPHOS 70 01/19/2013 0910   AST 43* 07/23/2013 1022   AST 21 01/19/2013 0910   ALT 72* 07/23/2013 1022   ALT 20 01/19/2013 0910   BILITOT 0.35 07/23/2013 1022   BILITOT 0.4 01/19/2013 0910        STUDIES: Mr Breast Bilateral W Wo Contrast  06/15/2013   CLINICAL DATA:  Biopsy-proven upper-outer quadrant left breast cancer. Question response to neoadjuvant chemotherapy.  EXAM: MR BILATERAL BREAST WITHOUT AND WITH CONTRAST  LABS:  Not applicable  TECHNIQUE: Multiplanar, multisequence MR images of both breasts were obtained prior to and following the intravenous administration of 12ml of MultiHance.  THREE-DIMENSIONAL MR IMAGE RENDERING ON INDEPENDENT WORKSTATION:  Three-dimensional MR images were rendered by post-processing of the original MR data on an independent workstation. The three-dimensional MR images were interpreted, and findings are reported in the following complete MRI report for this study.  COMPARISON:  Previous exams  FINDINGS: Breast composition: c:  Heterogeneous fibroglandular tissue  Background parenchymal enhancement: Moderate  Right breast: No mass or abnormal enhancement.  Left breast: Interval decrease in size of irregular enhancing mass in the posterior upper outer left breast. The mass measures 1.7 x 0.9 x 1.4 cm compared with 2.5 x 1.9 x 2 cm previously. No additional areas of abnormal enhancement.  Lymph nodes: No  abnormal appearing lymph nodes.  Ancillary findings:  None.  IMPRESSION: Interval positive response to therapy, with biopsy proven left breast carcinoma measuring 1.7 x 0.9 x 1.4 cm compared with 2.5 x 1.9 x 2 cm previously.  RECOMMENDATION: Treatment planning.  BI-RADS CATEGORY  6: Known biopsy-proven malignancy - appropriate action should be taken.   Electronically Signed   By: Jerene Dilling M.D.   On: 06/15/2013 13:42     ASSESSMENT:  47 y.o. [place] woman   (1)  status post left breast upper outer quadrant biopsy 04/06/2013 for a clinical T2 N1, stage IIB invasive ductal carcinoma, high-grade, triple negative, with an MIB-1 of 65%  (2)  being treated in the neoadjuvant setting,   (a) completed 4 dose dense cycles of doxorubicin/ cyclophosphamide 06/11/2013,.  (b)  0n 06/25/2013 starting 12 weekly cycles of carboplatin/ paclitaxel, to be followed by definitive surgery    PLAN: Alfonso will proceed to treatment today as scheduled for her 4th weekly dose of neoadjuvant carboplatin/paclitaxel. We'll continue to follow her closely, and she'll be seen on a weekly basis for labs and physical exam. I scheduled to see her again next week on January 5 in anticipation of her fifth dose of carboplatin/paclitaxel.  Dalayla did have a repeat breast MRI in November between her regimen of doxorubicin/cyclophosphamide and her first dose of carboplatin/paclitaxel on 06/25/2013. Unfortunately, she missed her appointment with her surgeon, Dr. Abbey Chatters,  in November, so we will get that rescheduled for her as soon as possible to assess response thus far, and further discuss her definitive surgery.  This plan was reviewed in detail with Brittney Vance today, and she was also given all of the above information in writing. She knows to call with any changes or problems prior to her next scheduled appointment.    Jenice Leiner, PA-C   07/23/2013 1:13 PM

## 2013-07-23 NOTE — Telephone Encounter (Signed)
, °

## 2013-07-23 NOTE — Telephone Encounter (Signed)
Per staff message and POF I have scheduled appts.  JMW  

## 2013-07-24 ENCOUNTER — Telehealth: Payer: Self-pay | Admitting: *Deleted

## 2013-07-24 ENCOUNTER — Encounter: Payer: Self-pay | Admitting: Oncology

## 2013-07-24 ENCOUNTER — Other Ambulatory Visit: Payer: Self-pay | Admitting: Oncology

## 2013-07-24 NOTE — Telephone Encounter (Signed)
This RN spoke with Brittney Vance per her call with concerns of diarrhea and gas during the night post chemo yesterday.  Gail states she had 2 watery stools since chemotherapy yesterday with carbo/taxol.  This RN discussed with Brittney Vance above is most likely side effect of taxol relating to the solution it is mixed in. Brittney Vance is to monitor stools, but usually diarrhea is self limiting.  Per conversation, Brylin states she is able to hydrate well, as this RN recommended need to replenish fluids lost with watery stools.  Gas is non painful but this RN did discuss use of " Gas X " ( ie simethecone ) if needed for gas.  Of note per discussion, Emaly states she had these symptoms with 2 previous treatments ( Brittney Vance had 4th treatment yesterday ) with loose stools lasting about 2 days post therapy.  At end of conversation,Brittney Vance had no further questions or concerns.  This note will be given to MD and midlevel for review.

## 2013-07-26 DIAGNOSIS — C50919 Malignant neoplasm of unspecified site of unspecified female breast: Secondary | ICD-10-CM

## 2013-07-26 HISTORY — DX: Malignant neoplasm of unspecified site of unspecified female breast: C50.919

## 2013-07-30 ENCOUNTER — Telehealth: Payer: Self-pay | Admitting: *Deleted

## 2013-07-30 ENCOUNTER — Ambulatory Visit (HOSPITAL_BASED_OUTPATIENT_CLINIC_OR_DEPARTMENT_OTHER): Payer: BC Managed Care – PPO | Admitting: Physician Assistant

## 2013-07-30 ENCOUNTER — Encounter: Payer: Self-pay | Admitting: Physician Assistant

## 2013-07-30 ENCOUNTER — Ambulatory Visit (HOSPITAL_BASED_OUTPATIENT_CLINIC_OR_DEPARTMENT_OTHER): Payer: BC Managed Care – PPO

## 2013-07-30 ENCOUNTER — Other Ambulatory Visit (HOSPITAL_BASED_OUTPATIENT_CLINIC_OR_DEPARTMENT_OTHER): Payer: BC Managed Care – PPO

## 2013-07-30 VITALS — BP 135/89 | HR 100 | Temp 98.4°F | Resp 18 | Ht 62.0 in | Wt 136.5 lb

## 2013-07-30 DIAGNOSIS — D649 Anemia, unspecified: Secondary | ICD-10-CM

## 2013-07-30 DIAGNOSIS — Z171 Estrogen receptor negative status [ER-]: Secondary | ICD-10-CM

## 2013-07-30 DIAGNOSIS — C50419 Malignant neoplasm of upper-outer quadrant of unspecified female breast: Secondary | ICD-10-CM

## 2013-07-30 DIAGNOSIS — R432 Parageusia: Secondary | ICD-10-CM | POA: Insufficient documentation

## 2013-07-30 DIAGNOSIS — C50412 Malignant neoplasm of upper-outer quadrant of left female breast: Secondary | ICD-10-CM

## 2013-07-30 DIAGNOSIS — R5383 Other fatigue: Secondary | ICD-10-CM

## 2013-07-30 DIAGNOSIS — R5381 Other malaise: Secondary | ICD-10-CM

## 2013-07-30 DIAGNOSIS — Z5111 Encounter for antineoplastic chemotherapy: Secondary | ICD-10-CM

## 2013-07-30 LAB — CBC WITH DIFFERENTIAL/PLATELET
BASO%: 0.7 % (ref 0.0–2.0)
Basophils Absolute: 0 10*3/uL (ref 0.0–0.1)
EOS%: 2 % (ref 0.0–7.0)
Eosinophils Absolute: 0.1 10*3/uL (ref 0.0–0.5)
HCT: 30.3 % — ABNORMAL LOW (ref 34.8–46.6)
HGB: 10.3 g/dL — ABNORMAL LOW (ref 11.6–15.9)
LYMPH%: 32.7 % (ref 14.0–49.7)
MCH: 31.3 pg (ref 25.1–34.0)
MCHC: 34 g/dL (ref 31.5–36.0)
MCV: 92.1 fL (ref 79.5–101.0)
MONO#: 0.2 10*3/uL (ref 0.1–0.9)
MONO%: 6.3 % (ref 0.0–14.0)
NEUT#: 1.8 10*3/uL (ref 1.5–6.5)
NEUT%: 58.3 % (ref 38.4–76.8)
Platelets: 269 10*3/uL (ref 145–400)
RBC: 3.29 10*6/uL — ABNORMAL LOW (ref 3.70–5.45)
RDW: 15.8 % — AB (ref 11.2–14.5)
WBC: 3 10*3/uL — ABNORMAL LOW (ref 3.9–10.3)
lymph#: 1 10*3/uL (ref 0.9–3.3)

## 2013-07-30 MED ORDER — SODIUM CHLORIDE 0.9 % IV SOLN
Freq: Once | INTRAVENOUS | Status: AC
  Administered 2013-07-30: 12:00:00 via INTRAVENOUS

## 2013-07-30 MED ORDER — FAMOTIDINE IN NACL 20-0.9 MG/50ML-% IV SOLN
INTRAVENOUS | Status: AC
  Filled 2013-07-30: qty 50

## 2013-07-30 MED ORDER — DIPHENHYDRAMINE HCL 50 MG/ML IJ SOLN
INTRAMUSCULAR | Status: AC
  Filled 2013-07-30: qty 1

## 2013-07-30 MED ORDER — SODIUM CHLORIDE 0.9 % IJ SOLN
10.0000 mL | INTRAMUSCULAR | Status: DC | PRN
  Administered 2013-07-30: 10 mL
  Filled 2013-07-30: qty 10

## 2013-07-30 MED ORDER — ONDANSETRON 16 MG/50ML IVPB (CHCC)
16.0000 mg | Freq: Once | INTRAVENOUS | Status: AC
  Administered 2013-07-30: 16 mg via INTRAVENOUS

## 2013-07-30 MED ORDER — PACLITAXEL CHEMO INJECTION 300 MG/50ML
80.0000 mg/m2 | Freq: Once | INTRAVENOUS | Status: AC
  Administered 2013-07-30: 132 mg via INTRAVENOUS
  Filled 2013-07-30: qty 22

## 2013-07-30 MED ORDER — ONDANSETRON 16 MG/50ML IVPB (CHCC)
INTRAVENOUS | Status: AC
  Filled 2013-07-30: qty 16

## 2013-07-30 MED ORDER — FAMOTIDINE IN NACL 20-0.9 MG/50ML-% IV SOLN
20.0000 mg | Freq: Once | INTRAVENOUS | Status: AC
  Administered 2013-07-30: 20 mg via INTRAVENOUS

## 2013-07-30 MED ORDER — SODIUM CHLORIDE 0.9 % IV SOLN
278.0000 mg | Freq: Once | INTRAVENOUS | Status: AC
  Administered 2013-07-30: 280 mg via INTRAVENOUS
  Filled 2013-07-30: qty 28

## 2013-07-30 MED ORDER — DIPHENHYDRAMINE HCL 50 MG/ML IJ SOLN
50.0000 mg | Freq: Once | INTRAMUSCULAR | Status: AC
  Administered 2013-07-30: 50 mg via INTRAVENOUS

## 2013-07-30 MED ORDER — DEXAMETHASONE SODIUM PHOSPHATE 20 MG/5ML IJ SOLN
INTRAMUSCULAR | Status: AC
  Filled 2013-07-30: qty 5

## 2013-07-30 MED ORDER — DEXAMETHASONE SODIUM PHOSPHATE 20 MG/5ML IJ SOLN
20.0000 mg | Freq: Once | INTRAMUSCULAR | Status: AC
  Administered 2013-07-30: 20 mg via INTRAVENOUS

## 2013-07-30 MED ORDER — HEPARIN SOD (PORK) LOCK FLUSH 100 UNIT/ML IV SOLN
500.0000 [IU] | Freq: Once | INTRAVENOUS | Status: AC | PRN
  Administered 2013-07-30: 500 [IU]
  Filled 2013-07-30: qty 5

## 2013-07-30 NOTE — Patient Instructions (Signed)
[facility] Discharge Instructions for Patients Receiving Chemotherapy  Today you received the following chemotherapy agents Taxol/Carboplatin To help prevent nausea and vomiting after your treatment, we encourage you to take your nausea medication as prescribed.     If you develop nausea and vomiting that is not controlled by your nausea medication, call the clinic.   BELOW ARE SYMPTOMS THAT SHOULD BE REPORTED IMMEDIATELY:  *FEVER GREATER THAN 100.5 F  *CHILLS WITH OR WITHOUT FEVER  NAUSEA AND VOMITING THAT IS NOT CONTROLLED WITH YOUR NAUSEA MEDICATION  *UNUSUAL SHORTNESS OF BREATH  *UNUSUAL BRUISING OR BLEEDING  TENDERNESS IN MOUTH AND THROAT WITH OR WITHOUT PRESENCE OF ULCERS  *URINARY PROBLEMS  *BOWEL PROBLEMS  UNUSUAL RASH Items with * indicate a potential emergency and should be followed up as soon as possible.  Feel free to call the clinic should you have any questions or concerns. The clinic phone number is [phone].    

## 2013-07-30 NOTE — Progress Notes (Signed)
IDNevin Vance Vance OB: 1965/08/23  MR#: [id number]  WPY#:[id number]  PCP: Brittney Lecher, MD GYN:  Brittney Vance SU: Brittney Vance OTHER MD: Brittney Vance  CHIEF COMPLAINT:  Left Breast Cancer, neoadjuvant chemotherapy  HISTORY OF PRESENT ILLNESS: Brittney Vance had routine screening mammography 03/09/2013 showing heterogeneously dense breasts, and a possible asymmetry in the left breast. Diagnostic left mammography and ultrasonography at the breast Center 03/30/2013 found a lobulated mass in the left breast upper outer quadrant measuring 1.6 cm. This was palpable. Ultrasound showed a complex microlobulated hypoechoic mass measuring 1.8 cm. There was no left axillary lymphadenopathy noted.  Biopsy of the mass in question 04/06/2013 showed ([id number]) and invasive ductal carcinoma, grade 3, triple negative, with an MIB-1 of 65%.  Bilateral breast MRI 04/13/2013 found an irregular mass measuring 2.5 cm in the left breast with a few lymph nodes that showed moderate cortical thickness, the largest measuring 12 mm (level I).  The patient's subsequent history is as detailed below  INTERVAL HISTORY: Brittney Vance returns alone today  for followup of her locally advanced left breast cancer. She is being treated neoadjuvantly, and is due for her 5th of 12 planned weekly doses of carboplatin/paclitaxel today.  Brittney Vance is tolerating treatment well overall, although I think she is getting weary coming on weekly basis. She continues to complain of some fatigue and is under quite a bit of stress. She also continues to have some taste aversion and is concerned about her nutrition. She would like to meet with our nutritionist for a brief consult. Fortunately, she's had no significant nausea and denies any change in bowel or bladder habits.   REVIEW OF SYSTEMS: In fact, overall Brittney Vance is doing very well. She's had no recent illnesses and denies any fevers or chills. Brittney Vance denies any rashes, abnormal bruising or bleeding.  Her  nail beds are still hyperpigmented as is the skin on her hands and feet, but but the nails are not tender, and there is no drainage or evidence of infection. She denies any mouth ulcers, oral sensitivity or problems swallowing.  She's had no abnormal headaches or dizziness, and denies any unusual myalgias, arthralgias, bony pain, or peripheral swelling.  She denies any signs of peripheral neuropathy in either the upper or lower extremities.  A detailed review of systems is otherwise stable and noncontributory.   PAST MEDICAL HISTORY: Past Medical History  Diagnosis Date  . Fibroids   . Hypertension   . Hypothyroid   . Breast cancer     PAST SURGICAL HISTORY: Past Surgical History  Procedure Laterality Date  . Tubal ligation    . Portacath placement Right 04/27/2013    Procedure: ULTRASOUND GUIDED PORT INSERTION WITH FLUOROSCOPY;  Surgeon: Brittney Hollingshead, MD;  Location: [facility];  Service: General;  Laterality: Right;    FAMILY HISTORY Family History  Problem Relation Age of Onset  . Hypertension Mother   . Diabetes Mother   . Alzheimer's disease Mother   . Hypertension Father   . Diabetes Father   . Hypertension Brother    the patient's father died in his 05L from complications of diabetes. The patient's mother is living, in her mid 57s. The patient had 2 brothers, and 2 sisters. There is no history of cancer in the family to her knowledge.  GYNECOLOGIC HISTORY:  Menarche age 83, first live birth age 33. The patient is GX P2. She is postmenopausal, but did have some perimenopausal bleeding, which was evaluated age 67 08/14/2012 with a transabdominal and transvaginal ultrasound  of the pelvis which found a normal uterine myometrium and left ovary. The right ovary could not be visualized.  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 She's currently out of work on disability due to her breast cancer and chemotherapy. Her  husband Brittney Vance also works for the [employer] system. Their children are Brittney Vance, Brittney Vance, 5. Brittney Vance's mother currently lives in the home as well. The patient attends a [employer].   ADVANCED DIRECTIVES: Not in place   HEALTH MAINTENANCE: (Updated 07/23/2013) History  Substance Use Topics  . Smoking status: Never Smoker   . Smokeless tobacco: Never Used  . Alcohol Use: 1.2 oz/week    2 Glasses of wine per week     Comment: occassion     Colonoscopy:- Never  PAP: 2013, Dr. [name]  Bone density: Never  Lipid panel: Dr. [name]    No Known Allergies  Current Outpatient Prescriptions  Medication Sig Dispense Refill  . hydrochlorothiazide (HYDRODIURIL) 25 MG tablet Take 25 mg by mouth daily after lunch.      . levothyroxine (SYNTHROID, LEVOTHROID) 88 MCG tablet Take 88 mcg by mouth daily before breakfast.      . lidocaine-prilocaine (EMLA) cream Apply topically as needed. 1-2 hrs before each procedure  30 g  2  . HYDROcodone-acetaminophen (NORCO/VICODIN) 5-325 MG per tablet TAKE 1 OR 2 TABLETS BY MOUTH EVERY 6 HOURS AS NEEDED FOR PAIN  30 tablet  0  . ondansetron (ZOFRAN) 8 MG tablet Take 1 tablet (8 mg total) by mouth 2 (two) times daily as needed for nausea or vomiting.  20 tablet  1  . prochlorperazine (COMPAZINE) 10 MG tablet Take one tablet 4 times a day (ac and hs)starting the evening of chemotherapy, and continue an additional 3 days  30 tablet  2  . tobramycin-dexamethasone (TOBRADEX) ophthalmic solution Place 1 drop into both eyes 2 (two) times daily.  5 mL  0   No current facility-administered medications for this visit.   Facility-Administered Medications Ordered in Other Visits  Medication Dose Route Frequency Provider Last Rate Last Dose  . sodium chloride 0.9 % injection 10 mL  10 mL Intracatheter PRN Brittney Burrow, PA-C   10 mL at 07/30/13 1455    OBJECTIVE: [name] in no acute distress    Filed Vitals:   07/30/13 1046   BP: 135/89  Pulse: 100  Temp: 98.4 F (36.9 C)  Resp: 18     Body mass index is 24.96 kg/(m^2).    ECOG FS:1 Filed Weights   07/30/13 1046  Weight: 136 lb 8 oz (61.916 kg)   Physical Exam: HEENT:  Sclerae anicteric.  Oropharynx clear an moist. No ulcerations or candidiasis.   Neck is supple. NODES:  No cervical or supraclavicular lymphadenopathy palpated.  BREAST EXAM:   Deferred. No axillary lymphadenopathy palpated. LUNGS:  Clear to auscultation bilaterally with good excursion.  No wheezes or rhonchi.   HEART:  Regular rate and rhythm.   No murmur appreciated ABDOMEN:  Soft, nondistended, nontender to palpation.  Positive bowel sounds.  MSK:  No focal spinal tenderness to palpation. Good range of motion bilaterally in the upper extremities.  EXTREMITIES:  No peripheral edema.   SKIN:  Hyperpigmentation noted on the palms of the hands bilaterally. The fingernails are also hyperpigmented, but there is no loosening of the nails from the nailbeds, no drainage, no evidence of infection. No  additional  rashes and no cracking or peeling of the skin.  Port is intact in the right upper chest wall with no erythema and no evidence of infection/cellulitis.  NEURO:  Nonfocal. Well oriented.  Flat and frustrated  affect.    LAB RESULTS:  Lab Results  Component Value Date   WBC 3.0* 07/30/2013   NEUTROABS 1.8 07/30/2013   HGB 10.3* 07/30/2013   HCT 30.3* 07/30/2013   MCV 92.1 07/30/2013   PLT 269 07/30/2013      Chemistry      Component Value Date/Time   NA 141 07/23/2013 1022   NA 136 04/11/2013 0927   K 3.7 07/23/2013 1022   K 3.9 04/11/2013 0927   CL 103 04/11/2013 0927   CO2 29 07/23/2013 1022   CO2 29 04/11/2013 0927   BUN 8.0 07/23/2013 1022   BUN 10 04/11/2013 0927   CREATININE 0.6 07/23/2013 1022   CREATININE 0.67 04/11/2013 0927   CREATININE 0.65 05/21/2009 2243      Component Value Date/Time   CALCIUM 9.5 07/23/2013 1022   CALCIUM 9.5 04/11/2013 0927   ALKPHOS 77 07/23/2013 1022    ALKPHOS 70 01/19/2013 0910   AST 43* 07/23/2013 1022   AST 21 01/19/2013 0910   ALT 72* 07/23/2013 1022   ALT 20 01/19/2013 0910   BILITOT 0.35 07/23/2013 1022   BILITOT 0.4 01/19/2013 0910        STUDIES: Mr Breast Bilateral W Wo Contrast  06/15/2013   CLINICAL DATA:  Biopsy-proven upper-outer quadrant left breast cancer. Question response to neoadjuvant chemotherapy.  EXAM: MR BILATERAL BREAST WITHOUT AND WITH CONTRAST  LABS:  Not applicable  TECHNIQUE: Multiplanar, multisequence MR images of both breasts were obtained prior to and following the intravenous administration of 80ml of MultiHance.  THREE-DIMENSIONAL MR IMAGE RENDERING ON INDEPENDENT WORKSTATION:  Three-dimensional MR images were rendered by post-processing of the original MR data on an independent workstation. The three-dimensional MR images were interpreted, and findings are reported in the following complete MRI report for this study.  COMPARISON:  Previous exams  FINDINGS: Breast composition: c:  Heterogeneous fibroglandular tissue  Background parenchymal enhancement: Moderate  Right breast: No mass or abnormal enhancement.  Left breast: Interval decrease in size of irregular enhancing mass in the posterior upper outer left breast. The mass measures 1.7 x 0.9 x 1.4 cm compared with 2.5 x 1.9 x 2 cm previously. No additional areas of abnormal enhancement.  Lymph nodes: No abnormal appearing lymph nodes.  Ancillary findings:  None.  IMPRESSION: Interval positive response to therapy, with biopsy proven left breast carcinoma measuring 1.7 x 0.9 x 1.4 cm compared with 2.5 x 1.9 x 2 cm previously.  RECOMMENDATION: Treatment planning.  BI-RADS CATEGORY  6: Known biopsy-proven malignancy - appropriate action should be taken.   Electronically Signed   By: Donavan Burnet M.D.   On: 06/15/2013 13:42     ASSESSMENT: 48 y.o. Brittney Vance woman   (1)  status post left breast upper outer quadrant biopsy 04/06/2013 for a clinical T2 N1, stage  IIB invasive ductal carcinoma, high-grade, triple negative, with an MIB-1 of 65%  (2)  being treated in the neoadjuvant setting,   (a) completed 4 dose dense cycles of doxorubicin/ cyclophosphamide 06/11/2013,.  (b)  0n 06/25/2013 starting 12 weekly cycles of carboplatin/ paclitaxel, to be followed by definitive surgery    PLAN: Brittney Vance will proceed to treatment today as scheduled for her 5th weekly dose of neoadjuvant carboplatin/paclitaxel. We'll continue to follow her closely, and  I plan to see her again next week on January 12 prior to her sixth weekly dose of  carboplatin/paclitaxel.  Brittney Vance did have a repeat breast MRI in November between her regimen of doxorubicin/cyclophosphamide and her first dose of carboplatin/paclitaxel on 06/25/2013, but has not yet seen her surgeon. She has been scheduled to see Dr. Clarise Cruz in late January to assess response thus far and to discuss definitive surgery.  This plan was again reviewed with Brittney Vance today, and she  voiced her understanding and agreement. She knows to call with any changes or problems prior to her next scheduled appointment.    Tamela Elsayed, PA-C   07/30/2013 4:58 PM

## 2013-07-30 NOTE — Telephone Encounter (Signed)
appts made and printed. Pt is aware that tx will be added. i emailed MW to add the tx's...td

## 2013-07-30 NOTE — Telephone Encounter (Signed)
Per staff message and POF I have scheduled appts.  JMW  

## 2013-08-01 ENCOUNTER — Encounter: Payer: Self-pay | Admitting: Oncology

## 2013-08-03 ENCOUNTER — Telehealth: Payer: Self-pay | Admitting: *Deleted

## 2013-08-03 NOTE — Telephone Encounter (Signed)
Called pt to make her aware that Santiago Glad will be out of the office on 2/19 & we need to switch her appt.  She is not happy about this, but cannot do anything about it cause she needs her testing done.  I confirmed 09/17/13 genetic appt.  Reschedule genetic appt and cancelled lab appt for the 19th since pt already had a lab appt scheduled for the 23rd and placed a note in Appt notes in [facility] for genetics to be drawn at the same time as Amy's labs.  Emailed Santiago Glad to make her aware.

## 2013-08-06 ENCOUNTER — Encounter: Payer: BC Managed Care – PPO | Admitting: Nutrition

## 2013-08-06 ENCOUNTER — Ambulatory Visit (HOSPITAL_BASED_OUTPATIENT_CLINIC_OR_DEPARTMENT_OTHER): Payer: BC Managed Care – PPO

## 2013-08-06 ENCOUNTER — Other Ambulatory Visit: Payer: Self-pay | Admitting: Physician Assistant

## 2013-08-06 ENCOUNTER — Ambulatory Visit: Payer: BC Managed Care – PPO | Admitting: Nutrition

## 2013-08-06 ENCOUNTER — Encounter: Payer: Self-pay | Admitting: Physician Assistant

## 2013-08-06 ENCOUNTER — Other Ambulatory Visit (HOSPITAL_BASED_OUTPATIENT_CLINIC_OR_DEPARTMENT_OTHER): Payer: BC Managed Care – PPO

## 2013-08-06 ENCOUNTER — Ambulatory Visit (HOSPITAL_BASED_OUTPATIENT_CLINIC_OR_DEPARTMENT_OTHER): Payer: BC Managed Care – PPO | Admitting: Physician Assistant

## 2013-08-06 VITALS — BP 122/86 | HR 109 | Temp 98.3°F | Resp 18 | Ht 62.0 in | Wt 133.4 lb

## 2013-08-06 DIAGNOSIS — C50419 Malignant neoplasm of upper-outer quadrant of unspecified female breast: Secondary | ICD-10-CM

## 2013-08-06 DIAGNOSIS — Z171 Estrogen receptor negative status [ER-]: Secondary | ICD-10-CM

## 2013-08-06 DIAGNOSIS — C50412 Malignant neoplasm of upper-outer quadrant of left female breast: Secondary | ICD-10-CM

## 2013-08-06 DIAGNOSIS — D649 Anemia, unspecified: Secondary | ICD-10-CM

## 2013-08-06 DIAGNOSIS — R432 Parageusia: Secondary | ICD-10-CM

## 2013-08-06 DIAGNOSIS — Z5111 Encounter for antineoplastic chemotherapy: Secondary | ICD-10-CM

## 2013-08-06 LAB — COMPREHENSIVE METABOLIC PANEL (CC13)
ALBUMIN: 4.2 g/dL (ref 3.5–5.0)
ALT: 40 U/L (ref 0–55)
ANION GAP: 12 meq/L — AB (ref 3–11)
AST: 31 U/L (ref 5–34)
Alkaline Phosphatase: 72 U/L (ref 40–150)
BILIRUBIN TOTAL: 0.43 mg/dL (ref 0.20–1.20)
BUN: 13.4 mg/dL (ref 7.0–26.0)
CO2: 26 mEq/L (ref 22–29)
Calcium: 10.1 mg/dL (ref 8.4–10.4)
Chloride: 103 mEq/L (ref 98–109)
Creatinine: 0.7 mg/dL (ref 0.6–1.1)
Glucose: 90 mg/dl (ref 70–140)
Potassium: 3.4 mEq/L — ABNORMAL LOW (ref 3.5–5.1)
Sodium: 141 mEq/L (ref 136–145)
Total Protein: 7.4 g/dL (ref 6.4–8.3)

## 2013-08-06 LAB — CBC WITH DIFFERENTIAL/PLATELET
BASO%: 0.8 % (ref 0.0–2.0)
Basophils Absolute: 0 10*3/uL (ref 0.0–0.1)
EOS ABS: 0 10*3/uL (ref 0.0–0.5)
EOS%: 0.8 % (ref 0.0–7.0)
HCT: 31.7 % — ABNORMAL LOW (ref 34.8–46.6)
HGB: 10.9 g/dL — ABNORMAL LOW (ref 11.6–15.9)
LYMPH#: 0.8 10*3/uL — AB (ref 0.9–3.3)
LYMPH%: 34.3 % (ref 14.0–49.7)
MCH: 32.2 pg (ref 25.1–34.0)
MCHC: 34.4 g/dL (ref 31.5–36.0)
MCV: 93.5 fL (ref 79.5–101.0)
MONO#: 0.2 10*3/uL (ref 0.1–0.9)
MONO%: 8.2 % (ref 0.0–14.0)
NEUT%: 55.9 % (ref 38.4–76.8)
NEUTROS ABS: 1.4 10*3/uL — AB (ref 1.5–6.5)
PLATELETS: 364 10*3/uL (ref 145–400)
RBC: 3.39 10*6/uL — AB (ref 3.70–5.45)
RDW: 15.9 % — ABNORMAL HIGH (ref 11.2–14.5)
WBC: 2.5 10*3/uL — AB (ref 3.9–10.3)

## 2013-08-06 MED ORDER — ONDANSETRON 16 MG/50ML IVPB (CHCC)
INTRAVENOUS | Status: AC
  Filled 2013-08-06: qty 16

## 2013-08-06 MED ORDER — DEXAMETHASONE SODIUM PHOSPHATE 20 MG/5ML IJ SOLN
20.0000 mg | Freq: Once | INTRAMUSCULAR | Status: AC
  Administered 2013-08-06: 20 mg via INTRAVENOUS

## 2013-08-06 MED ORDER — FAMOTIDINE IN NACL 20-0.9 MG/50ML-% IV SOLN
INTRAVENOUS | Status: AC
  Filled 2013-08-06: qty 50

## 2013-08-06 MED ORDER — SODIUM CHLORIDE 0.9 % IV SOLN
Freq: Once | INTRAVENOUS | Status: AC
  Administered 2013-08-06: 11:00:00 via INTRAVENOUS

## 2013-08-06 MED ORDER — FAMOTIDINE IN NACL 20-0.9 MG/50ML-% IV SOLN
20.0000 mg | Freq: Once | INTRAVENOUS | Status: AC
  Administered 2013-08-06: 20 mg via INTRAVENOUS

## 2013-08-06 MED ORDER — SODIUM CHLORIDE 0.9 % IJ SOLN
10.0000 mL | INTRAMUSCULAR | Status: DC | PRN
  Administered 2013-08-06: 10 mL
  Filled 2013-08-06: qty 10

## 2013-08-06 MED ORDER — HEPARIN SOD (PORK) LOCK FLUSH 100 UNIT/ML IV SOLN
500.0000 [IU] | Freq: Once | INTRAVENOUS | Status: AC | PRN
  Administered 2013-08-06: 500 [IU]
  Filled 2013-08-06: qty 5

## 2013-08-06 MED ORDER — ONDANSETRON 16 MG/50ML IVPB (CHCC)
16.0000 mg | Freq: Once | INTRAVENOUS | Status: AC
  Administered 2013-08-06: 16 mg via INTRAVENOUS

## 2013-08-06 MED ORDER — SODIUM CHLORIDE 0.9 % IV SOLN
80.0000 mg/m2 | Freq: Once | INTRAVENOUS | Status: AC
  Administered 2013-08-06: 132 mg via INTRAVENOUS
  Filled 2013-08-06: qty 22

## 2013-08-06 MED ORDER — DIPHENHYDRAMINE HCL 50 MG/ML IJ SOLN
INTRAMUSCULAR | Status: AC
  Filled 2013-08-06: qty 1

## 2013-08-06 MED ORDER — SODIUM CHLORIDE 0.9 % IV SOLN
278.0000 mg | Freq: Once | INTRAVENOUS | Status: AC
  Administered 2013-08-06: 280 mg via INTRAVENOUS
  Filled 2013-08-06: qty 28

## 2013-08-06 MED ORDER — DEXAMETHASONE SODIUM PHOSPHATE 20 MG/5ML IJ SOLN
INTRAMUSCULAR | Status: AC
  Filled 2013-08-06: qty 5

## 2013-08-06 MED ORDER — DIPHENHYDRAMINE HCL 50 MG/ML IJ SOLN
50.0000 mg | Freq: Once | INTRAMUSCULAR | Status: AC
  Administered 2013-08-06: 50 mg via INTRAVENOUS

## 2013-08-06 NOTE — Patient Instructions (Signed)
[facility] Discharge Instructions for Patients Receiving Chemotherapy  Today you received the following chemotherapy agents Taxol/Carboplatin To help prevent nausea and vomiting after your treatment, we encourage you to take your nausea medication as prescribed.  If you develop nausea and vomiting that is not controlled by your nausea medication, call the clinic.   BELOW ARE SYMPTOMS THAT SHOULD BE REPORTED IMMEDIATELY:  *FEVER GREATER THAN 100.5 F  *CHILLS WITH OR WITHOUT FEVER  NAUSEA AND VOMITING THAT IS NOT CONTROLLED WITH YOUR NAUSEA MEDICATION  *UNUSUAL SHORTNESS OF BREATH  *UNUSUAL BRUISING OR BLEEDING  TENDERNESS IN MOUTH AND THROAT WITH OR WITHOUT PRESENCE OF ULCERS  *URINARY PROBLEMS  *BOWEL PROBLEMS  UNUSUAL RASH Items with * indicate a potential emergency and should be followed up as soon as possible.  Feel free to call the clinic you have any questions or concerns. The clinic phone number is [phone].    

## 2013-08-06 NOTE — Progress Notes (Signed)
This patient is a 48 year old female diagnosed with breast cancer.  She is a patient of Dr. [name] Hakim.  Past medical history includes hypertension, hypothyroidism.  Medications include Zofran, Compazine, and Synthroid.  Labs were reviewed.  Height: 62 inches. Weight: 133.4 pounds. Usual body weight 142 pounds July 2014. BMI: 24.39.  Patient reports fatigue and taste alterations.  She requested nutrition appointment to clarify nutrition strategies for eating during breast cancer treatment.  Nutrition diagnosis: Food and nutrition related knowledge deficit related to diagnosis of breast cancer and associated treatments as evidenced by no prior need for nutrition related information.  Intervention: I educated patient on strategies for consuming small, frequent meals with adequate protein to support weight maintenance.  I have encouraged patient to consume lower fat diet and incorporate plant-based foods to reduce chance for recurrence.  I have provided many references for patient to review, including organic foods, sugar and cancer, recipes, and references.  Questions were answered.  Teach back method used.  Monitoring, evaluation, goals: Patient will tolerate healthy, plant-based diet to promote weight maintenance throughout treatment.  Next visit: There is no followup scheduled.  Patient has my contact information if she develops questions.

## 2013-08-06 NOTE — Progress Notes (Signed)
Ok to treat with ANC 1.4 per Micah Flesher, PA

## 2013-08-07 NOTE — Progress Notes (Signed)
IDNevin Vance Vance OB: 12/15/1965  MR#: [id number]  [id number]  PCP: Beatrice Lecher, MD GYN:  Veneda Melter SU: [name] OTHER MD: Lance Morin  CHIEF COMPLAINT:  Left Breast Cancer, neoadjuvant chemotherapy  HISTORY OF PRESENT ILLNESS: [name] had routine screening mammography 03/09/2013 showing heterogeneously dense breasts, and a possible asymmetry in the left breast. Diagnostic left mammography and ultrasonography at the breast Center 03/30/2013 found a lobulated mass in the left breast upper outer quadrant measuring 1.6 cm. This was palpable. Ultrasound showed a complex microlobulated hypoechoic mass measuring 1.8 cm. There was no left axillary lymphadenopathy noted.  Biopsy of the mass in question 04/06/2013 showed ([id number]) and invasive ductal carcinoma, grade 3, triple negative, with an MIB-1 of 65%.  Bilateral breast MRI 04/13/2013 found an irregular mass measuring 2.5 cm in the left breast with a few lymph nodes that showed moderate cortical thickness, the largest measuring 12 mm (level I).  The patient's subsequent history is as detailed below  INTERVAL HISTORY: Brittney Vance returns alone today  for followup of her locally advanced left breast cancer. She is being treated neoadjuvantly, and is due for her 6th of 12 planned weekly doses of carboplatin/paclitaxel today.  Although Brittney Vance is actually tolerating treatment well, she continues to be extremely tired. This is probably her biggest complaint. She is having some hot flashes, but denies any fevers or chills. Her appetite is decreased, but she's had no nausea or emesis.  REVIEW OF SYSTEMS: In fact, overall Brittney Vance is doing well. She's had no recent illnesses. She has some hyperpigmentation of the hands and nailbeds bilaterally, but no loosening of the nails, no pain in the nailbeds, no drainage or evidence of infection. She's had no additional rashes or skin changes elsewhere. She denies any abnormal bruising or bleeding.  She denies any mouth ulcers, oral sensitivity or problems swallowing. She's had no change in bowel or bladder habits.  She's had no abnormal headaches or dizziness, and denies any unusual myalgias, arthralgias, bony pain, or peripheral swelling.  She denies any signs of peripheral neuropathy in either the upper or lower extremities.  A detailed review of systems is otherwise stable and noncontributory.   PAST MEDICAL HISTORY: Past Medical History  Diagnosis Date  . Fibroids   . Hypertension   . Hypothyroid   . Breast cancer     PAST SURGICAL HISTORY: Past Surgical History  Procedure Laterality Date  . Tubal ligation    . Portacath placement Right 04/27/2013    Procedure: ULTRASOUND GUIDED PORT INSERTION WITH FLUOROSCOPY;  Surgeon: [name], MD;  Location: [facility];  Service: General;  Laterality: Right;    FAMILY HISTORY Family History  Problem Relation Age of Onset  . Hypertension Mother   . Diabetes Mother   . Alzheimer's disease Mother   . Hypertension Father   . Diabetes Father   . Hypertension Brother    the patient's father died in his [id number] from complications of diabetes. The patient's mother is living, in her mid 49s. The patient had 2 brothers, and 2 sisters. There is no history of cancer in the family to her knowledge.  GYNECOLOGIC HISTORY:  Menarche age 1, first live birth age 79. The patient is GX P2. She is postmenopausal, but did have some perimenopausal bleeding, which was evaluated age 48 08/14/2012 with a transabdominal and transvaginal ultrasound of the pelvis which found a normal uterine myometrium and left ovary. The right ovary could not be visualized.  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 She's currently out of work on disability due to her breast cancer and chemotherapy. Her husband Brittney Vance also works for the [employer]. Their children are Brittney Vance, Brittney Vance, 5. Brittney Vance's  mother currently lives in the home as well. The patient attends a [employer].   ADVANCED DIRECTIVES: Not in place   HEALTH MAINTENANCE: (Updated 07/23/2013) History  Substance Use Topics  . Smoking status: Never Smoker   . Smokeless tobacco: Never Used  . Alcohol Use: 1.2 oz/week    2 Glasses of wine per week     Comment: occassion     Colonoscopy:- Never  PAP: 2013, Dr. [name]  Bone density: Never  Lipid panel: Dr. [name]    No Known Allergies  Current Outpatient Prescriptions  Medication Sig Dispense Refill  . hydrochlorothiazide (HYDRODIURIL) 25 MG tablet Take 25 mg by mouth daily after lunch.      Marland Kitchen HYDROcodone-acetaminophen (NORCO/VICODIN) 5-325 MG per tablet TAKE 1 OR 2 TABLETS BY MOUTH EVERY 6 HOURS AS NEEDED FOR PAIN  30 tablet  0  . levothyroxine (SYNTHROID, LEVOTHROID) 88 MCG tablet Take 88 mcg by mouth daily before breakfast.      . lidocaine-prilocaine (EMLA) cream Apply topically as needed. 1-2 hrs before each procedure  30 g  2  . ondansetron (ZOFRAN) 8 MG tablet Take 1 tablet (8 mg total) by mouth 2 (two) times daily as needed for nausea or vomiting.  20 tablet  1  . prochlorperazine (COMPAZINE) 10 MG tablet Take one tablet 4 times a day (ac and hs)starting the evening of chemotherapy, and continue an additional 3 days  30 tablet  2  . tobramycin-dexamethasone (TOBRADEX) ophthalmic solution Place 1 drop into both eyes 2 (two) times daily.  5 mL  0   No current facility-administered medications for this visit.    OBJECTIVE: [name] in no acute distress    Filed Vitals:   08/06/13 0957  BP: 122/86  Pulse: 109  Temp: 98.3 F (36.8 C)  Resp: 18     Body mass index is 24.39 kg/(m^2).    ECOG FS:1 Filed Weights   08/06/13 0957  Weight: 133 lb 6.4 oz (60.51 kg)   Physical Exam: HEENT:  Sclerae anicteric.  Oropharynx clear, pink, and moist. No ulcerations or oropharyngeal candidiasis.   Neck is supple, trachea  midline. NODES:  No cervical or supraclavicular lymphadenopathy palpated.  BREAST EXAM:   Deferred. No axillary lymphadenopathy palpated. LUNGS:  Clear to auscultation bilaterally with good excursion.  No wheezes or rhonchi.   HEART:  Regular rate and rhythm.   No murmurs, gallops or rubs. ABDOMEN:  Soft, nondistended, nontender to palpation. No organomegaly palpated. Positive bowel sounds.  MSK:  No focal spinal tenderness to palpation. Good range of motion bilaterally in the upper extremities.  EXTREMITIES:  No peripheral edema.   SKIN:  Hyperpigmentation noted on the palms of the hands and nailbeds bilaterally. There is no loosening of the nails from the nailbeds, no drainage, no evidence of infection. No  additional rashes and no cracking or peeling of the skin.  Port is intact in the right upper chest wall with no erythema and no evidence of infection/cellulitis.  NEURO:  Nonfocal. Well oriented.  Flat 1affect.    LAB RESULTS:  Lab Results  Component Value Date   WBC 2.5* 08/06/2013   NEUTROABS 1.4* 08/06/2013   HGB 10.9* 08/06/2013   HCT  31.7* 08/06/2013   MCV 93.5 08/06/2013   PLT 364 08/06/2013      Chemistry      Component Value Date/Time   NA 141 08/06/2013 0933   NA 136 04/11/2013 0927   K 3.4* 08/06/2013 0933   K 3.9 04/11/2013 0927   CL 103 04/11/2013 0927   CO2 26 08/06/2013 0933   CO2 29 04/11/2013 0927   BUN 13.4 08/06/2013 0933   BUN 10 04/11/2013 0927   CREATININE 0.7 08/06/2013 0933   CREATININE 0.67 04/11/2013 0927   CREATININE 0.65 05/21/2009 2243      Component Value Date/Time   CALCIUM 10.1 08/06/2013 0933   CALCIUM 9.5 04/11/2013 0927   ALKPHOS 72 08/06/2013 0933   ALKPHOS 70 01/19/2013 0910   AST 31 08/06/2013 0933   AST 21 01/19/2013 0910   ALT 40 08/06/2013 0933   ALT 20 01/19/2013 0910   BILITOT 0.43 08/06/2013 0933   BILITOT 0.4 01/19/2013 0910        STUDIES: Mr Breast Bilateral W Wo Contrast  06/15/2013   CLINICAL DATA:  Biopsy-proven upper-outer  quadrant left breast cancer. Question response to neoadjuvant chemotherapy.  EXAM: MR BILATERAL BREAST WITHOUT AND WITH CONTRAST  LABS:  Not applicable  TECHNIQUE: Multiplanar, multisequence MR images of both breasts were obtained prior to and following the intravenous administration of 3ml of MultiHance.  THREE-DIMENSIONAL MR IMAGE RENDERING ON INDEPENDENT WORKSTATION:  Three-dimensional MR images were rendered by post-processing of the original MR data on an independent workstation. The three-dimensional MR images were interpreted, and findings are reported in the following complete MRI report for this study.  COMPARISON:  Previous exams  FINDINGS: Breast composition: c:  Heterogeneous fibroglandular tissue  Background parenchymal enhancement: Moderate  Right breast: No mass or abnormal enhancement.  Left breast: Interval decrease in size of irregular enhancing mass in the posterior upper outer left breast. The mass measures 1.7 x 0.9 x 1.4 cm compared with 2.5 x 1.9 x 2 cm previously. No additional areas of abnormal enhancement.  Lymph nodes: No abnormal appearing lymph nodes.  Ancillary findings:  None.  IMPRESSION: Interval positive response to therapy, with biopsy proven left breast carcinoma measuring 1.7 x 0.9 x 1.4 cm compared with 2.5 x 1.9 x 2 cm previously.  RECOMMENDATION: Treatment planning.  BI-RADS CATEGORY  6: Known biopsy-proven malignancy - appropriate action should be taken.   Electronically Signed   By: Donavan Burnet M.D.   On: 06/15/2013 13:42     ASSESSMENT: 48 y.o. [place] woman   (1)  status post left breast upper outer quadrant biopsy 04/06/2013 for a clinical T2 N1, stage IIB invasive ductal carcinoma, high-grade, triple negative, with an MIB-1 of 65%  (2)  being treated in the neoadjuvant setting,   (a) completed 4 dose dense cycles of doxorubicin/ cyclophosphamide 06/11/2013,.  (b)  0n 06/25/2013 started 12 weekly cycles of carboplatin/ paclitaxel, to be followed by  definitive surgery    PLAN: Parys will proceed to treatment today as scheduled for her 6th weekly dose of neoadjuvant carboplatin/paclitaxel. We'll continue to follow her closely, and I plan to see her again next week on January 19 for labs and physical exam prior to her 7th weekly dose of  carboplatin/paclitaxel.  I will mention that Florestine's neutrophils are slightly low today at 1.4.  The plan is to treat as long as her neutrophils are equal to or greater than 1.0.  I have notified our pharmacy with these guidelines.  This plan was  reviewed with Brittney Vance today, and she  voiced her understanding and agreement. She knows to call with any changes or problems prior to her next scheduled appointment.    Avrianna Smart, PA-C   08/07/2013 1:01 PM

## 2013-08-13 ENCOUNTER — Encounter: Payer: Self-pay | Admitting: Physician Assistant

## 2013-08-13 ENCOUNTER — Ambulatory Visit (HOSPITAL_BASED_OUTPATIENT_CLINIC_OR_DEPARTMENT_OTHER): Payer: BC Managed Care – PPO

## 2013-08-13 ENCOUNTER — Other Ambulatory Visit (HOSPITAL_BASED_OUTPATIENT_CLINIC_OR_DEPARTMENT_OTHER): Payer: BC Managed Care – PPO

## 2013-08-13 ENCOUNTER — Ambulatory Visit (HOSPITAL_BASED_OUTPATIENT_CLINIC_OR_DEPARTMENT_OTHER): Payer: BC Managed Care – PPO | Admitting: Physician Assistant

## 2013-08-13 ENCOUNTER — Other Ambulatory Visit: Payer: BC Managed Care – PPO

## 2013-08-13 ENCOUNTER — Other Ambulatory Visit: Payer: Self-pay | Admitting: Oncology

## 2013-08-13 VITALS — BP 118/83 | HR 118 | Temp 98.3°F | Resp 18 | Ht 62.0 in | Wt 134.2 lb

## 2013-08-13 DIAGNOSIS — E876 Hypokalemia: Secondary | ICD-10-CM

## 2013-08-13 DIAGNOSIS — C50412 Malignant neoplasm of upper-outer quadrant of left female breast: Secondary | ICD-10-CM

## 2013-08-13 DIAGNOSIS — F411 Generalized anxiety disorder: Secondary | ICD-10-CM

## 2013-08-13 DIAGNOSIS — C50419 Malignant neoplasm of upper-outer quadrant of unspecified female breast: Secondary | ICD-10-CM

## 2013-08-13 DIAGNOSIS — Z5111 Encounter for antineoplastic chemotherapy: Secondary | ICD-10-CM

## 2013-08-13 DIAGNOSIS — Z171 Estrogen receptor negative status [ER-]: Secondary | ICD-10-CM

## 2013-08-13 DIAGNOSIS — T7840XA Allergy, unspecified, initial encounter: Secondary | ICD-10-CM

## 2013-08-13 DIAGNOSIS — D649 Anemia, unspecified: Secondary | ICD-10-CM

## 2013-08-13 DIAGNOSIS — R110 Nausea: Secondary | ICD-10-CM

## 2013-08-13 DIAGNOSIS — D709 Neutropenia, unspecified: Secondary | ICD-10-CM

## 2013-08-13 LAB — CBC WITH DIFFERENTIAL/PLATELET
BASO%: 2 % (ref 0.0–2.0)
BASOS ABS: 0 10*3/uL (ref 0.0–0.1)
EOS ABS: 0 10*3/uL (ref 0.0–0.5)
EOS%: 1 % (ref 0.0–7.0)
HEMATOCRIT: 29.9 % — AB (ref 34.8–46.6)
HEMOGLOBIN: 10.3 g/dL — AB (ref 11.6–15.9)
LYMPH#: 0.5 10*3/uL — AB (ref 0.9–3.3)
LYMPH%: 25.9 % (ref 14.0–49.7)
MCH: 32.2 pg (ref 25.1–34.0)
MCHC: 34.4 g/dL (ref 31.5–36.0)
MCV: 93.4 fL (ref 79.5–101.0)
MONO#: 0.2 10*3/uL (ref 0.1–0.9)
MONO%: 8.8 % (ref 0.0–14.0)
NEUT%: 62.3 % (ref 38.4–76.8)
NEUTROS ABS: 1.3 10*3/uL — AB (ref 1.5–6.5)
Platelets: 244 10*3/uL (ref 145–400)
RBC: 3.2 10*6/uL — ABNORMAL LOW (ref 3.70–5.45)
RDW: 15.6 % — AB (ref 11.2–14.5)
WBC: 2.1 10*3/uL — ABNORMAL LOW (ref 3.9–10.3)
nRBC: 0 % (ref 0–0)

## 2013-08-13 MED ORDER — FAMOTIDINE IN NACL 20-0.9 MG/50ML-% IV SOLN
20.0000 mg | Freq: Once | INTRAVENOUS | Status: AC
  Administered 2013-08-13: 20 mg via INTRAVENOUS

## 2013-08-13 MED ORDER — DIPHENHYDRAMINE HCL 25 MG PO CAPS
ORAL_CAPSULE | ORAL | Status: AC
  Filled 2013-08-13: qty 1

## 2013-08-13 MED ORDER — SODIUM CHLORIDE 0.9 % IV SOLN
240.0000 mg | Freq: Once | INTRAVENOUS | Status: AC
  Administered 2013-08-13: 240 mg via INTRAVENOUS
  Filled 2013-08-13: qty 24

## 2013-08-13 MED ORDER — DIPHENHYDRAMINE HCL 50 MG/ML IJ SOLN
INTRAMUSCULAR | Status: AC
  Filled 2013-08-13: qty 1

## 2013-08-13 MED ORDER — SODIUM CHLORIDE 0.9 % IV SOLN
80.0000 mg/m2 | Freq: Once | INTRAVENOUS | Status: AC
  Administered 2013-08-13: 132 mg via INTRAVENOUS
  Filled 2013-08-13: qty 22

## 2013-08-13 MED ORDER — ONDANSETRON 16 MG/50ML IVPB (CHCC)
INTRAVENOUS | Status: AC
  Filled 2013-08-13: qty 16

## 2013-08-13 MED ORDER — DEXAMETHASONE SODIUM PHOSPHATE 20 MG/5ML IJ SOLN
20.0000 mg | Freq: Once | INTRAMUSCULAR | Status: AC
  Administered 2013-08-13: 20 mg via INTRAVENOUS

## 2013-08-13 MED ORDER — SODIUM CHLORIDE 0.9 % IV SOLN
Freq: Once | INTRAVENOUS | Status: AC
  Administered 2013-08-13: 11:00:00 via INTRAVENOUS

## 2013-08-13 MED ORDER — DEXAMETHASONE SODIUM PHOSPHATE 20 MG/5ML IJ SOLN
INTRAMUSCULAR | Status: AC
  Filled 2013-08-13: qty 5

## 2013-08-13 MED ORDER — FAMOTIDINE IN NACL 20-0.9 MG/50ML-% IV SOLN
INTRAVENOUS | Status: AC
  Filled 2013-08-13: qty 50

## 2013-08-13 MED ORDER — DIPHENHYDRAMINE HCL 50 MG/ML IJ SOLN
25.0000 mg | Freq: Once | INTRAMUSCULAR | Status: AC
  Administered 2013-08-13: 25 mg via INTRAVENOUS

## 2013-08-13 MED ORDER — SODIUM CHLORIDE 0.9 % IJ SOLN
10.0000 mL | INTRAMUSCULAR | Status: DC | PRN
  Administered 2013-08-13: 10 mL
  Filled 2013-08-13: qty 10

## 2013-08-13 MED ORDER — ONDANSETRON 16 MG/50ML IVPB (CHCC)
16.0000 mg | Freq: Once | INTRAVENOUS | Status: AC
  Administered 2013-08-13: 16 mg via INTRAVENOUS

## 2013-08-13 MED ORDER — LORAZEPAM 1 MG PO TABS
ORAL_TABLET | ORAL | Status: AC
  Filled 2013-08-13: qty 1

## 2013-08-13 MED ORDER — HEPARIN SOD (PORK) LOCK FLUSH 100 UNIT/ML IV SOLN
500.0000 [IU] | Freq: Once | INTRAVENOUS | Status: AC | PRN
  Administered 2013-08-13: 500 [IU]
  Filled 2013-08-13: qty 5

## 2013-08-13 NOTE — Progress Notes (Signed)
IDNevin Vance Vance OB: [birth year]  MR#: [id number]  UXL#:[id number]  PCP: Beatrice Lecher, MD GYN:  Veneda Melter SU: [name] OTHER MD: Lance Morin  CHIEF COMPLAINT:  Left Breast Cancer, neoadjuvant chemotherapy  HISTORY OF PRESENT ILLNESS: Brittney Vance had routine screening mammography 03/09/2013 showing heterogeneously dense breasts, and a possible asymmetry in the left breast. Diagnostic left mammography and ultrasonography at the breast Center 03/30/2013 found a lobulated mass in the left breast upper outer quadrant measuring 1.6 cm. This was palpable. Ultrasound showed a complex microlobulated hypoechoic mass measuring 1.8 cm. There was no left axillary lymphadenopathy noted.  Biopsy of the mass in question 04/06/2013 showed ([id number]) and invasive ductal carcinoma, grade 3, triple negative, with an MIB-1 of 65%.  Bilateral breast MRI 04/13/2013 found an irregular mass measuring 2.5 cm in the left breast with a few lymph nodes that showed moderate cortical thickness, the largest measuring 12 mm (level I).  The patient's subsequent history is as detailed below  INTERVAL HISTORY: Brittney Vance returns today accompanied by a friend/coworker for followup of her locally advanced left breast cancer. She is being treated neoadjuvantly, and is due for her 7th of 12 planned weekly doses of carboplatin/paclitaxel today.  At presentation today, Brittney Vance is complaining of itching in the soles of both feet. She was given Benadryl 25 mg by mouth as soon as she came into the office today, and his itching is beginning to improve. She's currently having no shortness of breath, no problems swallowing, and notes no swelling of the tongue. She tells me that yesterday her scalp and hands were itching. The common denominator is the fact that she ate the same muffins both mornings. She's made no additional changes in her diet, and has used no new products (soaps, lotions, detergents, etc.).    During our appointment,  she became increasingly anxious, and felt nauseous. Fortunately, she did not actually vomit. She was given lorazepam, 0.5 mg sublingual, and within a few minutes was feeling much better, and was ready to proceed with treatment.  With regards to chemotherapy, Brittney Vance is actually tolerating treatment well. She continues to be tired, and this continues to be her biggest complaint. She continues to have hot flashes. She does have some taste aversion and a poor appetite, but typically no nausea or change in bowel habits.  Fortunately, she has had no signs of numbness or 2 going in the upper or lower extremities.   REVIEW OF SYSTEMS: Brittney Vance denies any recent illnesses and has had no fevers or chills. She continues to have some hyperpigmentation of the hands and nailbeds, but no sensitivity, drainage, or evidence of infection. She has no additional skin changes elsewhere. She's had no signs of abnormal bleeding.  She denies any mouth ulcers, oral sensitivity or problems swallowing. She's had no change in bowel or bladder habits.  She's had no abnormal headaches or dizziness, and denies any unusual myalgias, arthralgias, bony pain, or peripheral swelling. She continues to have some depression and anxiety, but denies suicidal ideation. She feels a little forgetful at times.  A detailed review of systems is otherwise stable and noncontributory.   PAST MEDICAL HISTORY: Past Medical History  Diagnosis Date  . Fibroids   . Hypertension   . Hypothyroid   . Breast cancer     PAST SURGICAL HISTORY: Past Surgical History  Procedure Laterality Date  . Tubal ligation    . Portacath placement Right 04/27/2013    Procedure: ULTRASOUND GUIDED PORT INSERTION WITH FLUOROSCOPY;  Surgeon: Rhunette Croft  Rosenbower, MD;  Location: [facility];  Service: General;  Laterality: Right;    FAMILY HISTORY Family History  Problem Relation Age of Onset  . Hypertension Mother   . Diabetes Mother   . Alzheimer's disease Mother   .  Hypertension Father   . Diabetes Father   . Hypertension Brother    the patient's father died in his [id number] from complications of diabetes. The patient's mother is living, in her mid 78s. The patient had 2 brothers, and 2 sisters. There is no history of cancer in the family to her knowledge.  GYNECOLOGIC HISTORY:  Menarche age 65, first live birth age 62. The patient is GX P2. She is postmenopausal, but did have some perimenopausal bleeding, which was evaluated age 62 08/14/2012 with a transabdominal and transvaginal ultrasound of the pelvis which found a normal uterine myometrium and left ovary. The right ovary could not be visualized.  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 She's currently out of work on disability due to her breast cancer and chemotherapy. Her husband [name] also works for the [employer] system. Their children are [name], [place], 5. Brittney Vance's mother currently lives in the home as well. The patient attends a [employer].   ADVANCED DIRECTIVES: Not in place   HEALTH MAINTENANCE: (Updated 07/23/2013) History  Substance Use Topics  . Smoking status: Never Smoker   . Smokeless tobacco: Never Used  . Alcohol Use: 1.2 oz/week    2 Glasses of wine per week     Comment: occassion     Colonoscopy:- Never  PAP: 2013, Dr. [name]  Bone density: Never  Lipid panel: Dr. [name]    No Known Allergies  Current Outpatient Prescriptions  Medication Sig Dispense Refill  . hydrochlorothiazide (HYDRODIURIL) 25 MG tablet Take 25 mg by mouth daily after lunch.      Marland Kitchen HYDROcodone-acetaminophen (NORCO/VICODIN) 5-325 MG per tablet TAKE 1 OR 2 TABLETS BY MOUTH EVERY 6 HOURS AS NEEDED FOR PAIN  30 tablet  0  . levothyroxine (SYNTHROID, LEVOTHROID) 88 MCG tablet Take 88 mcg by mouth daily before breakfast.      . lidocaine-prilocaine (EMLA) cream Apply topically as needed. 1-2 hrs before each procedure   30 g  2  . tobramycin-dexamethasone (TOBRADEX) ophthalmic solution Place 1 drop into both eyes 2 (two) times daily.  5 mL  0  . ondansetron (ZOFRAN) 8 MG tablet Take 1 tablet (8 mg total) by mouth 2 (two) times daily as needed for nausea or vomiting.  20 tablet  1  . prochlorperazine (COMPAZINE) 10 MG tablet Take one tablet 4 times a day (ac and hs)starting the evening of chemotherapy, and continue an additional 3 days  30 tablet  2   No current facility-administered medications for this visit.    OBJECTIVE: [name] in no acute distress    Filed Vitals:   08/13/13 0934  BP: 118/83  Pulse: 118  Temp: 98.3 F (36.8 C)  Resp: 18     Body mass index is 24.54 kg/(m^2).    ECOG FS:1 Filed Weights   08/13/13 0934  Weight: 134 lb 3.2 oz (60.873 kg)   By the time I examined the patient during our visit today, the pruritis has improved, almost resolved.  Physical Exam: HEENT:  Sclerae anicteric.  Oropharynx clear, moist. No ulcerations or oropharyngeal candidiasis.   Neck is supple, trachea midline. NODES:  No cervical or  supraclavicular lymphadenopathy palpated.  BREAST EXAM:   Deferred. No axillary lymphadenopathy palpated. LUNGS:  Clear to auscultation bilaterally.  No wheezes or rhonchi.   HEART:  Regular rate and rhythm.   No murmurs, gallops or rubs. ABDOMEN:  Soft, nondistended, nontender to palpation. No organomegaly or masses palpated. Positive bowel sounds.  MSK:  No focal spinal tenderness to palpation. Good range of motion bilaterally in the upper extremities.  EXTREMITIES:  No peripheral edema.   SKIN:  Hyperpigmentation noted on the palms of the hands and nailbeds bilaterally. There is no loosening of the nails from the nailbeds, no drainage, no evidence of infection. The soles of the feet were also examined, and there was no visible rashes, no skin changes, and no excessive erythema. Port is intact in the right upper chest wall with no erythema and no  evidence of infection/cellulitis.  NEURO:  Nonfocal. Well oriented.  Anxious affect.    LAB RESULTS:  Lab Results  Component Value Date   WBC 2.1* 08/13/2013   NEUTROABS 1.3* 08/13/2013   HGB 10.3* 08/13/2013   HCT 29.9* 08/13/2013   MCV 93.4 08/13/2013   PLT 244 08/13/2013      Chemistry      Component Value Date/Time   NA 141 08/06/2013 0933   NA 136 04/11/2013 0927   K 3.4* 08/06/2013 0933   K 3.9 04/11/2013 0927   CL 103 04/11/2013 0927   CO2 26 08/06/2013 0933   CO2 29 04/11/2013 0927   BUN 13.4 08/06/2013 0933   BUN 10 04/11/2013 0927   CREATININE 0.7 08/06/2013 0933   CREATININE 0.67 04/11/2013 0927   CREATININE 0.65 05/21/2009 2243      Component Value Date/Time   CALCIUM 10.1 08/06/2013 0933   CALCIUM 9.5 04/11/2013 0927   ALKPHOS 72 08/06/2013 0933   ALKPHOS 70 01/19/2013 0910   AST 31 08/06/2013 0933   AST 21 01/19/2013 0910   ALT 40 08/06/2013 0933   ALT 20 01/19/2013 0910   BILITOT 0.43 08/06/2013 0933   BILITOT 0.4 01/19/2013 0910        STUDIES: Mr Breast Bilateral W Wo Contrast  06/15/2013   CLINICAL DATA:  Biopsy-proven upper-outer quadrant left breast cancer. Question response to neoadjuvant chemotherapy.  EXAM: MR BILATERAL BREAST WITHOUT AND WITH CONTRAST  LABS:  Not applicable  TECHNIQUE: Multiplanar, multisequence MR images of both breasts were obtained prior to and following the intravenous administration of 4ml of MultiHance.  THREE-DIMENSIONAL MR IMAGE RENDERING ON INDEPENDENT WORKSTATION:  Three-dimensional MR images were rendered by post-processing of the original MR data on an independent workstation. The three-dimensional MR images were interpreted, and findings are reported in the following complete MRI report for this study.  COMPARISON:  Previous exams  FINDINGS: Breast composition: c:  Heterogeneous fibroglandular tissue  Background parenchymal enhancement: Moderate  Right breast: No mass or abnormal enhancement.  Left breast: Interval decrease in size of  irregular enhancing mass in the posterior upper outer left breast. The mass measures 1.7 x 0.9 x 1.4 cm compared with 2.5 x 1.9 x 2 cm previously. No additional areas of abnormal enhancement.  Lymph nodes: No abnormal appearing lymph nodes.  Ancillary findings:  None.  IMPRESSION: Interval positive response to therapy, with biopsy proven left breast carcinoma measuring 1.7 x 0.9 x 1.4 cm compared with 2.5 x 1.9 x 2 cm previously.  RECOMMENDATION: Treatment planning.  BI-RADS CATEGORY  6: Known biopsy-proven malignancy - appropriate action should be taken.   Electronically Signed  By: Donavan Burnet M.D.   On: 06/15/2013 13:42     ASSESSMENT: 48 y.o. [place] woman   (1)  status post left breast upper outer quadrant biopsy 04/06/2013 for a clinical T2 N1, stage IIB invasive ductal carcinoma, high-grade, triple negative, with an MIB-1 of 65%  (2)  being treated in the neoadjuvant setting,   (a) completed 4 dose dense cycles of doxorubicin/ cyclophosphamide 06/11/2013,.  (b)  0n 06/25/2013 started 12 weekly cycles of carboplatin/ paclitaxel, to be followed by definitive surgery    PLAN: As noted above, Veridiana was given Benadryl and lorazepam during our clinical visit today, and by the time she departed the office on her way to chemotherapy, she was beginning to feel a little better. Her nausea was beginning to resolve. She was feeling less anxious, and the itching in the soles of her feet had resolved. She will be sure she takes a look at the ingredients of the muffins she has been eating, as it sounds like they may likely be the cause of an allergic reaction.   Otherwise, Consuella will receive her seventh dose of neoadjuvant carboplatin/paclitaxel today as scheduled. I will see her again next week on January 26 in anticipation of her eighth cycle. The plan is to complete a total of 12 cycles prior to surgery.  I will mention that Faylynn's neutrophils remain slightly low.  The plan is to treat as  long as her neutrophils are equal to or greater than 1.0.  I have notified our pharmacy with these guidelines.  This plan was reviewed with Brittney Vance today, and she  voiced her understanding and agreement. She knows to call with any changes or problems prior to her next scheduled appointment.    Dustin Bumbaugh, PA-C   08/13/2013 4:53 PM

## 2013-08-13 NOTE — Progress Notes (Signed)
Pt presented this am in office with pruritus to both feet. Pt states it started this morning after her shower. She has used no new soaps or lotions. Gave pt 25 mg Benadryl PO @9 :47a per Amy Berry PA-C. Message to be forwarded to [employer].

## 2013-08-13 NOTE — Progress Notes (Signed)
Per Micah Flesher, PA, okay to treat today with ANC 1.3.  SLJ

## 2013-08-13 NOTE — Patient Instructions (Signed)
[facility] Discharge Instructions for Patients Receiving Chemotherapy  Today you received the following chemotherapy agents carboplatin, taxol To help prevent nausea and vomiting after your treatment, we encourage you to take your nausea medication as needed   If you develop nausea and vomiting that is not controlled by your nausea medication, call the clinic.   BELOW ARE SYMPTOMS THAT SHOULD BE REPORTED IMMEDIATELY:  *FEVER GREATER THAN 100.5 F  *CHILLS WITH OR WITHOUT FEVER  NAUSEA AND VOMITING THAT IS NOT CONTROLLED WITH YOUR NAUSEA MEDICATION  *UNUSUAL SHORTNESS OF BREATH  *UNUSUAL BRUISING OR BLEEDING  TENDERNESS IN MOUTH AND THROAT WITH OR WITHOUT PRESENCE OF ULCERS  *URINARY PROBLEMS  *BOWEL PROBLEMS  UNUSUAL RASH Items with * indicate a potential emergency and should be followed up as soon as possible.  Feel free to call the clinic you have any questions or concerns. The clinic phone number is [phone].

## 2013-08-14 ENCOUNTER — Telehealth: Payer: Self-pay | Admitting: *Deleted

## 2013-08-14 MED ORDER — AMBULATORY NON FORMULARY MEDICATION: Status: DC

## 2013-08-14 NOTE — Telephone Encounter (Signed)
Pt called and would like rx for Wig since she has [facility]. rx printed. Pt called and informed also given information regarding who she can call about this.Brittney Vance [place]

## 2013-08-20 ENCOUNTER — Ambulatory Visit (HOSPITAL_BASED_OUTPATIENT_CLINIC_OR_DEPARTMENT_OTHER): Payer: BC Managed Care – PPO | Admitting: Physician Assistant

## 2013-08-20 ENCOUNTER — Encounter: Payer: Self-pay | Admitting: Physician Assistant

## 2013-08-20 ENCOUNTER — Other Ambulatory Visit (HOSPITAL_BASED_OUTPATIENT_CLINIC_OR_DEPARTMENT_OTHER): Payer: BC Managed Care – PPO

## 2013-08-20 ENCOUNTER — Telehealth: Payer: Self-pay | Admitting: *Deleted

## 2013-08-20 ENCOUNTER — Ambulatory Visit (HOSPITAL_BASED_OUTPATIENT_CLINIC_OR_DEPARTMENT_OTHER): Payer: BC Managed Care – PPO

## 2013-08-20 ENCOUNTER — Telehealth: Payer: Self-pay | Admitting: Oncology

## 2013-08-20 VITALS — BP 121/81 | HR 102 | Temp 98.2°F | Resp 18 | Ht 62.0 in | Wt 135.0 lb

## 2013-08-20 DIAGNOSIS — C50419 Malignant neoplasm of upper-outer quadrant of unspecified female breast: Secondary | ICD-10-CM

## 2013-08-20 DIAGNOSIS — D649 Anemia, unspecified: Secondary | ICD-10-CM | POA: Insufficient documentation

## 2013-08-20 DIAGNOSIS — C50412 Malignant neoplasm of upper-outer quadrant of left female breast: Secondary | ICD-10-CM

## 2013-08-20 DIAGNOSIS — D709 Neutropenia, unspecified: Secondary | ICD-10-CM

## 2013-08-20 DIAGNOSIS — Z171 Estrogen receptor negative status [ER-]: Secondary | ICD-10-CM

## 2013-08-20 DIAGNOSIS — Z5111 Encounter for antineoplastic chemotherapy: Secondary | ICD-10-CM

## 2013-08-20 DIAGNOSIS — F341 Dysthymic disorder: Secondary | ICD-10-CM

## 2013-08-20 DIAGNOSIS — L819 Disorder of pigmentation, unspecified: Secondary | ICD-10-CM

## 2013-08-20 LAB — COMPREHENSIVE METABOLIC PANEL (CC13)
ALBUMIN: 4 g/dL (ref 3.5–5.0)
ALT: 32 U/L (ref 0–55)
AST: 26 U/L (ref 5–34)
Alkaline Phosphatase: 71 U/L (ref 40–150)
Anion Gap: 9 mEq/L (ref 3–11)
BUN: 10 mg/dL (ref 7.0–26.0)
CALCIUM: 9.9 mg/dL (ref 8.4–10.4)
CHLORIDE: 104 meq/L (ref 98–109)
CO2: 29 meq/L (ref 22–29)
Creatinine: 0.6 mg/dL (ref 0.6–1.1)
Glucose: 89 mg/dl (ref 70–140)
POTASSIUM: 3.6 meq/L (ref 3.5–5.1)
Sodium: 141 mEq/L (ref 136–145)
Total Bilirubin: 0.3 mg/dL (ref 0.20–1.20)
Total Protein: 6.9 g/dL (ref 6.4–8.3)

## 2013-08-20 LAB — CBC WITH DIFFERENTIAL/PLATELET
BASO%: 0.8 % (ref 0.0–2.0)
BASOS ABS: 0 10*3/uL (ref 0.0–0.1)
EOS ABS: 0 10*3/uL (ref 0.0–0.5)
EOS%: 1.2 % (ref 0.0–7.0)
HCT: 30.1 % — ABNORMAL LOW (ref 34.8–46.6)
HEMOGLOBIN: 10.4 g/dL — AB (ref 11.6–15.9)
LYMPH%: 37.5 % (ref 14.0–49.7)
MCH: 32.6 pg (ref 25.1–34.0)
MCHC: 34.6 g/dL (ref 31.5–36.0)
MCV: 94.4 fL (ref 79.5–101.0)
MONO#: 0.2 10*3/uL (ref 0.1–0.9)
MONO%: 7.9 % (ref 0.0–14.0)
NEUT#: 1.3 10*3/uL — ABNORMAL LOW (ref 1.5–6.5)
NEUT%: 52.6 % (ref 38.4–76.8)
PLATELETS: 162 10*3/uL (ref 145–400)
RBC: 3.19 10*6/uL — ABNORMAL LOW (ref 3.70–5.45)
RDW: 15.1 % — ABNORMAL HIGH (ref 11.2–14.5)
WBC: 2.5 10*3/uL — AB (ref 3.9–10.3)
lymph#: 1 10*3/uL (ref 0.9–3.3)
nRBC: 0 % (ref 0–0)

## 2013-08-20 MED ORDER — DIPHENHYDRAMINE HCL 50 MG/ML IJ SOLN
50.0000 mg | Freq: Once | INTRAMUSCULAR | Status: AC
  Administered 2013-08-20: 50 mg via INTRAVENOUS

## 2013-08-20 MED ORDER — SODIUM CHLORIDE 0.9 % IJ SOLN
10.0000 mL | INTRAMUSCULAR | Status: DC | PRN
  Administered 2013-08-20: 10 mL
  Filled 2013-08-20: qty 10

## 2013-08-20 MED ORDER — FAMOTIDINE IN NACL 20-0.9 MG/50ML-% IV SOLN
20.0000 mg | Freq: Once | INTRAVENOUS | Status: AC
  Administered 2013-08-20: 20 mg via INTRAVENOUS

## 2013-08-20 MED ORDER — DEXAMETHASONE SODIUM PHOSPHATE 20 MG/5ML IJ SOLN
20.0000 mg | Freq: Once | INTRAMUSCULAR | Status: AC
  Administered 2013-08-20: 20 mg via INTRAVENOUS

## 2013-08-20 MED ORDER — ONDANSETRON 16 MG/50ML IVPB (CHCC)
16.0000 mg | Freq: Once | INTRAVENOUS | Status: AC
  Administered 2013-08-20: 16 mg via INTRAVENOUS

## 2013-08-20 MED ORDER — ONDANSETRON 16 MG/50ML IVPB (CHCC)
INTRAVENOUS | Status: AC
  Filled 2013-08-20: qty 16

## 2013-08-20 MED ORDER — SODIUM CHLORIDE 0.9 % IV SOLN
Freq: Once | INTRAVENOUS | Status: AC
  Administered 2013-08-20: 13:00:00 via INTRAVENOUS

## 2013-08-20 MED ORDER — SODIUM CHLORIDE 0.9 % IV SOLN
240.0000 mg | Freq: Once | INTRAVENOUS | Status: AC
  Administered 2013-08-20: 240 mg via INTRAVENOUS
  Filled 2013-08-20: qty 24

## 2013-08-20 MED ORDER — DEXAMETHASONE SODIUM PHOSPHATE 20 MG/5ML IJ SOLN
INTRAMUSCULAR | Status: AC
  Filled 2013-08-20: qty 5

## 2013-08-20 MED ORDER — HEPARIN SOD (PORK) LOCK FLUSH 100 UNIT/ML IV SOLN
500.0000 [IU] | Freq: Once | INTRAVENOUS | Status: AC | PRN
  Administered 2013-08-20: 500 [IU]
  Filled 2013-08-20: qty 5

## 2013-08-20 MED ORDER — DIPHENHYDRAMINE HCL 50 MG/ML IJ SOLN
INTRAMUSCULAR | Status: AC
  Filled 2013-08-20: qty 1

## 2013-08-20 MED ORDER — FAMOTIDINE IN NACL 20-0.9 MG/50ML-% IV SOLN
INTRAVENOUS | Status: AC
  Filled 2013-08-20: qty 50

## 2013-08-20 MED ORDER — SODIUM CHLORIDE 0.9 % IV SOLN
80.0000 mg/m2 | Freq: Once | INTRAVENOUS | Status: AC
  Administered 2013-08-20: 132 mg via INTRAVENOUS
  Filled 2013-08-20: qty 22

## 2013-08-20 NOTE — Patient Instructions (Signed)
[facility] Discharge Instructions for Patients Receiving Chemotherapy  Today you received the following chemotherapy agents: Taxol and Carboplatin  To help prevent nausea and vomiting after your treatment, we encourage you to take your nausea medication as prescribed by your physician.   If you develop nausea and vomiting that is not controlled by your nausea medication, call the clinic.   BELOW ARE SYMPTOMS THAT SHOULD BE REPORTED IMMEDIATELY:  *FEVER GREATER THAN 100.5 F  *CHILLS WITH OR WITHOUT FEVER  NAUSEA AND VOMITING THAT IS NOT CONTROLLED WITH YOUR NAUSEA MEDICATION  *UNUSUAL SHORTNESS OF BREATH  *UNUSUAL BRUISING OR BLEEDING  TENDERNESS IN MOUTH AND THROAT WITH OR WITHOUT PRESENCE OF ULCERS  *URINARY PROBLEMS  *BOWEL PROBLEMS  UNUSUAL RASH Items with * indicate a potential emergency and should be followed up as soon as possible.  Feel free to call the clinic you have any questions or concerns. The clinic phone number is [phone].    

## 2013-08-20 NOTE — Progress Notes (Signed)
Per Micah Flesher PA, okay to treat today with today's lab values.

## 2013-08-20 NOTE — Telephone Encounter (Signed)
Per staff message I have adjusted 2/2 appt

## 2013-08-20 NOTE — Telephone Encounter (Signed)
, °

## 2013-08-20 NOTE — Progress Notes (Signed)
IDNevin Bloodgood Vance OB: 03-Mar-1966  MR#: [id number]  [id number]  PCP: Brittney Lecher, MD GYN:  Brittney Vance SU: Brittney Vance OTHER MD: Brittney Vance  CHIEF COMPLAINT:  Left Breast Cancer, neoadjuvant chemotherapy  HISTORY OF PRESENT ILLNESS: Brittney Vance had routine screening mammography 03/09/2013 showing heterogeneously dense breasts, and a possible asymmetry in the left breast. Diagnostic left mammography and ultrasonography at the breast Center 03/30/2013 found a lobulated mass in the left breast upper outer quadrant measuring 1.6 cm. This was palpable. Ultrasound showed a complex microlobulated hypoechoic mass measuring 1.8 cm. There was no left axillary lymphadenopathy noted.  Biopsy of the mass in question 04/06/2013 showed ([id number]) and invasive ductal carcinoma, grade 3, triple negative, with an MIB-1 of 65%.  Bilateral breast MRI 04/13/2013 found an irregular mass measuring 2.5 cm in the left breast with a few lymph nodes that showed moderate cortical thickness, the largest measuring 12 mm (level I).  The patient's subsequent history is as detailed below  INTERVAL HISTORY: Brittney Vance returns alone today  for followup of her locally advanced left breast cancer. She is being treated neoadjuvantly, and is due for her 8th of 12 planned weekly doses of carboplatin/paclitaxel today.  Brittney Vance is still having some intermittent and she and her palms of her hands. She is trying to narrow down what she has been eating, and determine if that it might be a food allergy. It happens even when she is not taking any of her antinausea medications, and it does not happen when she receives her chemotherapy, so I do not think is going to be anything related to her treatment here. She's had no visible rashes. She also denies any shortness of breath or problems swallowing, and has had no swelling of the tongue.  Otherwise, Brittney Vance's biggest concern today is the fact that her son has been diagnosed with the flu.  She's taking as many precautions and she can to avoid exposure. She herself has had no fevers, chills, or night sweats. She continues to be very tired. She's had no increased signs of neuropathy.   REVIEW OF SYSTEMS: Brittney Vance denies any recent illnesses. She continues to have some hyperpigmentation of the hands and nailbeds, but no tenderness or evidence of infection. She has no rashes elsewhere. She's had no signs of abnormal bleeding.  She denies any mouth ulcers, oral sensitivity or problems swallowing. She had one isolated episode of emesis last week, but typically has no problems with nausea or emesis. She's had no change in bowel or bladder habits.  She's had no abnormal headaches or dizziness, and denies any unusual myalgias, arthralgias, bony pain, or peripheral swelling. She continues to have some depression and anxiety.  A detailed review of systems is otherwise stable and noncontributory.   PAST MEDICAL HISTORY: Past Medical History  Diagnosis Date  . Fibroids   . Hypertension   . Hypothyroid   . Breast cancer     PAST SURGICAL HISTORY: Past Surgical History  Procedure Laterality Date  . Tubal ligation    . Portacath placement Right 04/27/2013    Procedure: ULTRASOUND GUIDED PORT INSERTION WITH FLUOROSCOPY;  Surgeon: Brittney Hollingshead, MD;  Location: [facility];  Service: General;  Laterality: Right;    FAMILY HISTORY Family History  Problem Relation Age of Onset  . Hypertension Mother   . Diabetes Mother   . Alzheimer's disease Mother   . Hypertension Father   . Diabetes Father   . Hypertension Brother    the patient's father died in his  16X from complications of diabetes. The patient's mother is living, in her mid 10s. The patient had 2 brothers, and 2 sisters. There is no history of cancer in the family to her knowledge.  GYNECOLOGIC HISTORY:  Menarche age 73, first live birth age 32. The patient is GX P2. She is postmenopausal, but did have some perimenopausal bleeding,  which was evaluated age 30 08/14/2012 with a transabdominal and transvaginal ultrasound of the pelvis which found a normal uterine myometrium and left ovary. The right ovary could not be visualized.  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 She's currently out of work on disability due to her breast cancer and chemotherapy. Her husband Brittney Vance also works for the [employer] system. Their children are Brittney Vance, Brittney Vance, 5. Brittney Vance's mother currently lives in the home as well. The patient attends a [employer].   ADVANCED DIRECTIVES: Not in place   HEALTH MAINTENANCE: (Updated 07/23/2013) History  Substance Use Topics  . Smoking status: Never Smoker   . Smokeless tobacco: Never Used  . Alcohol Use: 1.2 oz/week    2 Glasses of wine per week     Comment: occassion     Colonoscopy:- Never  PAP: 2013, Dr. Clovia Vance  Bone density: Never  Lipid panel: Dr. Madilyn Vance    No Known Allergies  Current Outpatient Prescriptions  Medication Sig Dispense Refill  . AMBULATORY NON FORMULARY MEDICATION Medication Name: Wig  (Medical necessity) Dx:174.4  1 Units  0  . hydrochlorothiazide (HYDRODIURIL) 25 MG tablet Take 25 mg by mouth daily after lunch.      Marland Kitchen HYDROcodone-acetaminophen (NORCO/VICODIN) 5-325 MG per tablet TAKE 1 OR 2 TABLETS BY MOUTH EVERY 6 HOURS AS NEEDED FOR PAIN  30 tablet  0  . levothyroxine (SYNTHROID, LEVOTHROID) 88 MCG tablet Take 88 mcg by mouth daily before breakfast.      . lidocaine-prilocaine (EMLA) cream Apply topically as needed. 1-2 hrs before each procedure  30 g  2  . ondansetron (ZOFRAN) 8 MG tablet Take 1 tablet (8 mg total) by mouth 2 (two) times daily as needed for nausea or vomiting.  20 tablet  1  . prochlorperazine (COMPAZINE) 10 MG tablet Take one tablet 4 times a day (ac and hs)starting the evening of chemotherapy, and continue an additional 3 days  30 tablet  2  .  tobramycin-dexamethasone (TOBRADEX) ophthalmic solution Place 1 drop into both eyes 2 (two) times daily.  5 mL  0   No current facility-administered medications for this visit.    OBJECTIVE: [name] in no acute distress    Filed Vitals:   08/20/13 1216  BP: 121/81  Pulse: 102  Temp: 98.2 F (36.8 C)  Resp: 18     Body mass index is 24.69 kg/(m^2).    ECOG FS:1 Filed Weights   08/20/13 1216  Weight: 135 lb (61.236 kg)  Physical Exam: HEENT:  Sclerae anicteric.  Oropharynx clear and moist with no ulcerations or evidence of oropharyngeal candidiasis.   Neck is supple, trachea midline. NODES:  No cervical or supraclavicular lymphadenopathy palpated.  BREAST EXAM:   Breast tissue is dense bilaterally. Right breast is unremarkable. Unable to palpate a distinct mass in the left breast today. Axillae are benign. No axillary lymphadenopathy palpated. LUNGS:  Clear to auscultation bilaterally.  No wheezes or rhonchi.   HEART:  Regular rate and rhythm.   No murmurs. ABDOMEN:  Soft, nontender to  palpation. No organomegaly or masses palpated. Positive bowel sounds.  MSK:  No focal spinal tenderness to palpation. Good range of motion bilaterally in the upper extremities.  EXTREMITIES:  No peripheral edema.   SKIN:  Hyperpigmentation noted on the palms of the hands and nailbeds bilaterally. There is no loosening of the nails from the nailbeds, no drainage, no evidence of infection. Port is intact in the right upper chest wall with no erythema and no evidence of infection/cellulitis.  NEURO:  Nonfocal. Well oriented.  Flat affect.    LAB RESULTS:  Lab Results  Component Value Date   WBC 2.5* 08/20/2013   NEUTROABS 1.3* 08/20/2013   HGB 10.4* 08/20/2013   HCT 30.1* 08/20/2013   MCV 94.4 08/20/2013   PLT 162 08/20/2013      Chemistry      Component Value Date/Time   NA 141 08/06/2013 0933   NA 136 04/11/2013 0927   K 3.4* 08/06/2013 0933   K 3.9 04/11/2013 0927   CL 103  04/11/2013 0927   CO2 26 08/06/2013 0933   CO2 29 04/11/2013 0927   BUN 13.4 08/06/2013 0933   BUN 10 04/11/2013 0927   CREATININE 0.7 08/06/2013 0933   CREATININE 0.67 04/11/2013 0927   CREATININE 0.65 05/21/2009 2243      Component Value Date/Time   CALCIUM 10.1 08/06/2013 0933   CALCIUM 9.5 04/11/2013 0927   ALKPHOS 72 08/06/2013 0933   ALKPHOS 70 01/19/2013 0910   AST 31 08/06/2013 0933   AST 21 01/19/2013 0910   ALT 40 08/06/2013 0933   ALT 20 01/19/2013 0910   BILITOT 0.43 08/06/2013 0933   BILITOT 0.4 01/19/2013 0910        STUDIES: Mr Breast Bilateral W Wo Contrast  06/15/2013   CLINICAL DATA:  Biopsy-proven upper-outer quadrant left breast cancer. Question response to neoadjuvant chemotherapy.  EXAM: MR BILATERAL BREAST WITHOUT AND WITH CONTRAST  LABS:  Not applicable  TECHNIQUE: Multiplanar, multisequence MR images of both breasts were obtained prior to and following the intravenous administration of 106ml of MultiHance.  THREE-DIMENSIONAL MR IMAGE RENDERING ON INDEPENDENT WORKSTATION:  Three-dimensional MR images were rendered by post-processing of the original MR data on an independent workstation. The three-dimensional MR images were interpreted, and findings are reported in the following complete MRI report for this study.  COMPARISON:  Previous exams  FINDINGS: Breast composition: c:  Heterogeneous fibroglandular tissue  Background parenchymal enhancement: Moderate  Right breast: No mass or abnormal enhancement.  Left breast: Interval decrease in size of irregular enhancing mass in the posterior upper outer left breast. The mass measures 1.7 x 0.9 x 1.4 cm compared with 2.5 x 1.9 x 2 cm previously. No additional areas of abnormal enhancement.  Lymph nodes: No abnormal appearing lymph nodes.  Ancillary findings:  None.  IMPRESSION: Interval positive response to therapy, with biopsy proven left breast carcinoma measuring 1.7 x 0.9 x 1.4 cm compared with 2.5 x 1.9 x 2 cm previously.   RECOMMENDATION: Treatment planning.  BI-RADS CATEGORY  6: Known biopsy-proven malignancy - appropriate action should be taken.   Electronically Signed   By: Donavan Burnet M.D.   On: 06/15/2013 13:42     ASSESSMENT: 48 y.o. [place] woman   (1)  status post left breast upper outer quadrant biopsy 04/06/2013 for a clinical T2 N1, stage IIB invasive ductal carcinoma, high-grade, triple negative, with an MIB-1 of 65%  (2)  being treated in the neoadjuvant setting,   (a) completed 4 dose  dense cycles of doxorubicin/ cyclophosphamide 06/11/2013,.  (b)  0n 06/25/2013 started 12 weekly cycles of carboplatin/ paclitaxel, to be followed by definitive surgery    PLAN: Mekiya will proceed to treatment today as scheduled for her eighth weekly dose of carboplatin/paclitaxel. Of course our plan is to complete a total of 12 cycles prior to surgery. She scheduled to meet with her surgeon, Dr. Zella Richer, later this week. I will also set her up for a final breast MRI in late February after completion of neoadjuvant chemotherapy.   We will continue to follow Brierra very closely on weekly basis. I will mention that Danira's neutrophils remain slightly low.  The plan is to treat as long as her neutrophils are equal to or greater than 1.0.  I have notified our pharmacy with these guidelines.  This plan was reviewed with Brittney Bloodgood today, and she  voiced her understanding and agreement. She knows to call with any changes or problems prior to her next scheduled appointment.    Flint Hakeem, PA-C   08/20/2013 12:30 PM

## 2013-08-22 ENCOUNTER — Ambulatory Visit (INDEPENDENT_AMBULATORY_CARE_PROVIDER_SITE_OTHER): Payer: BC Managed Care – PPO | Admitting: General Surgery

## 2013-08-22 ENCOUNTER — Encounter (INDEPENDENT_AMBULATORY_CARE_PROVIDER_SITE_OTHER): Payer: Self-pay | Admitting: General Surgery

## 2013-08-22 VITALS — BP 130/70 | HR 105 | Temp 98.4°F | Resp 14 | Ht 62.0 in | Wt 138.2 lb

## 2013-08-22 DIAGNOSIS — C50919 Malignant neoplasm of unspecified site of unspecified female breast: Secondary | ICD-10-CM

## 2013-08-22 NOTE — Patient Instructions (Signed)
We will refer you to the plastic surgeon.

## 2013-08-22 NOTE — Progress Notes (Signed)
Patient ID: Brittney Vance, female   DOB: Aug 16, 1965, 48 y.o.   MRN: [id number]  Chief Complaint  Patient presents with  . Breast Cancer Long Term Follow Up    breast f/u during treatment    HPI Brittney Vance is a 48 y.o. female.   HPI  She is undergoing neoadjuvant chemotherapy for invasive left breast cancer. She had a biopsy of an atypical-appearing lymph node in the left axilla that was benign. She is due to complete her treatments at the end of next month. She is here to discuss surgical treatment.  Her last MRI demonstrated decreased size of the tumor.  Past Medical History  Diagnosis Date  . Fibroids   . Hypertension   . Hypothyroid   . Breast cancer     Past Surgical History  Procedure Laterality Date  . Tubal ligation    . Portacath placement Right 04/27/2013    Procedure: ULTRASOUND GUIDED PORT INSERTION WITH FLUOROSCOPY;  Surgeon: Brittney Hollingshead, MD;  Location: [facility];  Service: General;  Laterality: Right;    Family History  Problem Relation Age of Onset  . Hypertension Mother   . Diabetes Mother   . Alzheimer's disease Mother   . Hypertension Father   . Diabetes Father   . Hypertension Brother     Social History History  Substance Use Topics  . Smoking status: Never Smoker   . Smokeless tobacco: Never Used  . Alcohol Use: 1.2 oz/week    2 Glasses of wine per week     Comment: occassion    No Known Allergies  Current Outpatient Prescriptions  Medication Sig Dispense Refill  . AMBULATORY NON FORMULARY MEDICATION Medication Name: Wig  (Medical necessity) Dx:174.4  1 Units  0  . hydrochlorothiazide (HYDRODIURIL) 25 MG tablet Take 25 mg by mouth daily after lunch.      Marland Kitchen HYDROcodone-acetaminophen (NORCO/VICODIN) 5-325 MG per tablet TAKE 1 OR 2 TABLETS BY MOUTH EVERY 6 HOURS AS NEEDED FOR PAIN  30 tablet  0  . levothyroxine (SYNTHROID, LEVOTHROID) 88 MCG tablet Take 88 mcg by mouth daily before breakfast.      . lidocaine-prilocaine (EMLA) cream Apply  topically as needed. 1-2 hrs before each procedure  30 g  2  . ondansetron (ZOFRAN) 8 MG tablet Take 1 tablet (8 mg total) by mouth 2 (two) times daily as needed for nausea or vomiting.  20 tablet  1  . prochlorperazine (COMPAZINE) 10 MG tablet Take one tablet 4 times a day (ac and hs)starting the evening of chemotherapy, and continue an additional 3 days  30 tablet  2  . tobramycin-dexamethasone (TOBRADEX) ophthalmic solution Place 1 drop into both eyes 2 (two) times daily.  5 mL  0   No current facility-administered medications for this visit.    Review of Systems Review of Systems  Constitutional: Negative.   HENT: Negative.   Respiratory: Negative.   Cardiovascular: Negative.   Gastrointestinal: Negative.   Genitourinary: Negative.   Musculoskeletal: Negative.     Blood pressure 130/70, pulse 105, temperature 98.4 F (36.9 C), temperature source Temporal, resp. rate 14, height 5\' 2"  (1.575 m), weight 138 lb 3.2 oz (62.687 kg), last menstrual period 03/10/2013.  Physical Exam Physical Exam  Constitutional: She appears well-developed and well-nourished. No distress.  HENT:  Head: Normocephalic and atraumatic.  Eyes: No scleral icterus.  Cardiovascular: Normal rate and regular rhythm.   Pulmonary/Chest: Effort normal.  Left breast-no palpable dominant mass. Small scars noted in left  upper outer quadrant.  Right breast-no palpable masses.  Abdominal: Soft. She exhibits no distension and no mass. There is no tenderness.  Musculoskeletal:  No axillary or supraclavicular adenopathy.  Lymphadenopathy:    She has no cervical adenopathy.  Neurological: She is alert.  Skin: Skin is warm and dry.    Data Reviewed MRI results. Nodes in Epic.  Assessment    Invasive left breast cancer-responding to neoadjuvant chemotherapy. We discussed surgical options including breast conservation therapy versus mastectomy. She would need a left axillary sentinel lymph node biopsy with either  procedure. She is interested in trying to avoid radiation if possible.     Plan    We discussed left breast lumpectomy versus mastectomy with sentinel lymph node biopsy in either case. We talked about the need for radiation following lumpectomy versus a very small chance of needing radiation after mastectomy. She is also interested in immediate reconstruction on the left side.  Will refer her to see a Psychiatric nurse. We will have her come back in 4-5 weeks with her husband and discussed surgical options again as she seems to be a little overwhelmed today.  I  explained the procedure, risks, and aftercare to her.  Risks include but are not limited to bleeding, infection, wound problems, anesthesia, chronic chest wall pain, nerve injury, seroma formation, lymphedema.      Brittney Vance 08/22/2013, 12:38 PM

## 2013-08-27 ENCOUNTER — Other Ambulatory Visit (HOSPITAL_BASED_OUTPATIENT_CLINIC_OR_DEPARTMENT_OTHER): Payer: BC Managed Care – PPO

## 2013-08-27 ENCOUNTER — Ambulatory Visit: Payer: BC Managed Care – PPO

## 2013-08-27 ENCOUNTER — Ambulatory Visit (HOSPITAL_BASED_OUTPATIENT_CLINIC_OR_DEPARTMENT_OTHER): Payer: BC Managed Care – PPO | Admitting: Hematology and Oncology

## 2013-08-27 ENCOUNTER — Telehealth: Payer: Self-pay | Admitting: *Deleted

## 2013-08-27 ENCOUNTER — Ambulatory Visit (HOSPITAL_BASED_OUTPATIENT_CLINIC_OR_DEPARTMENT_OTHER): Payer: BC Managed Care – PPO

## 2013-08-27 ENCOUNTER — Other Ambulatory Visit: Payer: BC Managed Care – PPO

## 2013-08-27 VITALS — BP 122/87 | HR 118 | Temp 99.0°F | Resp 18 | Ht 62.0 in | Wt 135.9 lb

## 2013-08-27 DIAGNOSIS — D649 Anemia, unspecified: Secondary | ICD-10-CM

## 2013-08-27 DIAGNOSIS — Z171 Estrogen receptor negative status [ER-]: Secondary | ICD-10-CM

## 2013-08-27 DIAGNOSIS — D72819 Decreased white blood cell count, unspecified: Secondary | ICD-10-CM

## 2013-08-27 DIAGNOSIS — C50412 Malignant neoplasm of upper-outer quadrant of left female breast: Secondary | ICD-10-CM

## 2013-08-27 DIAGNOSIS — C50419 Malignant neoplasm of upper-outer quadrant of unspecified female breast: Secondary | ICD-10-CM

## 2013-08-27 DIAGNOSIS — D709 Neutropenia, unspecified: Secondary | ICD-10-CM

## 2013-08-27 LAB — CBC WITH DIFFERENTIAL/PLATELET
BASO%: 3.3 % — AB (ref 0.0–2.0)
BASOS ABS: 0.1 10*3/uL (ref 0.0–0.1)
EOS%: 1.1 % (ref 0.0–7.0)
Eosinophils Absolute: 0 10*3/uL (ref 0.0–0.5)
HEMATOCRIT: 30.5 % — AB (ref 34.8–46.6)
HEMOGLOBIN: 10.7 g/dL — AB (ref 11.6–15.9)
LYMPH%: 43.4 % (ref 14.0–49.7)
MCH: 32.8 pg (ref 25.1–34.0)
MCHC: 35.1 g/dL (ref 31.5–36.0)
MCV: 93.6 fL (ref 79.5–101.0)
MONO#: 0.1 10*3/uL (ref 0.1–0.9)
MONO%: 7.7 % (ref 0.0–14.0)
NEUT#: 0.8 10*3/uL — ABNORMAL LOW (ref 1.5–6.5)
NEUT%: 44.5 % (ref 38.4–76.8)
Platelets: 180 10*3/uL (ref 145–400)
RBC: 3.26 10*6/uL — ABNORMAL LOW (ref 3.70–5.45)
RDW: 15 % — ABNORMAL HIGH (ref 11.2–14.5)
WBC: 1.8 10*3/uL — AB (ref 3.9–10.3)
lymph#: 0.8 10*3/uL — ABNORMAL LOW (ref 0.9–3.3)
nRBC: 0 % (ref 0–0)

## 2013-08-27 MED ORDER — FILGRASTIM 480 MCG/0.8ML IJ SOLN
480.0000 ug | Freq: Once | INTRAMUSCULAR | Status: AC
  Administered 2013-08-27: 480 ug via SUBCUTANEOUS
  Filled 2013-08-27: qty 0.8

## 2013-08-27 NOTE — Patient Instructions (Signed)
Filgrastim, G-CSF injection What is this medicine? FILGRASTIM, G-CSF (fil GRA stim) stimulates the formation of white blood cells. This medicine is given to patients with conditions that may cause a decrease in white blood cells, like those receiving certain types of chemotherapy or bone marrow transplant. It helps the bone marrow recover its ability to produce white blood cells. Increasing the amount of white blood cells helps to decrease the risk of infection and fever. This medicine may be used for other purposes; ask your health care provider or pharmacist if you have questions. COMMON BRAND NAME(S): Neupogen What should I tell my health care provider before I take this medicine? They need to know if you have any of these conditions: -currently receiving radiation therapy -sickle cell disease -an unusual or allergic reaction to filgrastim, E. coli protein, other medicines, foods, dyes, or preservatives -pregnant or trying to get pregnant -breast-feeding How should I use this medicine? This medicine is for injection into a vein or injection under the skin. It is usually given by a health care professional in a hospital or clinic setting. If you get this medicine at home, you will be taught how to prepare and give this medicine. Always change the site for the injection under the skin. Let the solution warm to room temperature before you use it. Do not shake the solution before you withdraw a dose. Throw away any unused portion. Use exactly as directed. Take your medicine at regular intervals. Do not take your medicine more often than directed. It is important that you put your used needles and syringes in a special sharps container. Do not put them in a trash can. If you do not have a sharps container, call your pharmacist or healthcare provider to get one. Talk to your pediatrician regarding the use of this medicine in children. While this medicine may be prescribed for children for selected  conditions, precautions do apply. Overdosage: If you think you have taken too much of this medicine contact a poison control center or emergency room at once. NOTE: This medicine is only for you. Do not share this medicine with others. What if I miss a dose? Try not to miss doses. If you miss a dose take the dose as soon as you remember. If it is almost time for the next dose, do not take double doses unless told to by your doctor or health care professional. What may interact with this medicine? -lithium -medicines for cancer chemotherapy This list may not describe all possible interactions. Give your health care provider a list of all the medicines, herbs, non-prescription drugs, or dietary supplements you use. Also tell them if you smoke, drink alcohol, or use illegal drugs. Some items may interact with your medicine. What should I watch for while using this medicine? Visit your doctor or health care professional for regular checks on your progress. If you get a fever or any sign of infection while you are using this medicine, do not treat yourself. Check with your doctor or health care professional. Bone pain can usually be relieved by mild pain relievers such as acetaminophen or ibuprofen. Check with your doctor or health care professional before taking these medicines as they may hide a fever. Call your doctor or health care professional if the aches and pains are severe or do not go away. What side effects may I notice from receiving this medicine? Side effects that you should report to your doctor or health care professional as soon as possible: -allergic reactions   like skin rash, itching or hives, swelling of the face, lips, or tongue -difficulty breathing, wheezing -fever -pain, redness, or swelling at the injection site -stomach or side pain, or pain at the shoulder Side effects that usually do not require medical attention (report to your doctor or health care professional if they  continue or are bothersome): -bone pain (ribs, lower back, breast bone) -headache -skin rash This list may not describe all possible side effects. Call your doctor for medical advice about side effects. You may report side effects to FDA at 1-800-FDA-1088. Where should I keep my medicine? Keep out of the reach of children. Store in a refrigerator between 2 and 8 degrees C (36 and 46 degrees F). Do not freeze or leave in direct sunlight. If vials or syringes are left out of the refrigerator for more than 24 hours, they must be thrown away. Throw away unused vials after the expiration date on the carton. NOTE: This sheet is a summary. It may not cover all possible information. If you have questions about this medicine, talk to your doctor, pharmacist, or health care provider.  2014, [employer]vier/Gold Standard. (2007-09-27 13:33:21)  

## 2013-08-27 NOTE — Telephone Encounter (Signed)
appts made and printed...td 

## 2013-08-27 NOTE — Progress Notes (Signed)
ID: Newell Coral Gallop OB: 1966/01/10  MR#: [id number]  [id number]  PCP: Beatrice Lecher, MD GYN:  Veneda Melter SU: [name] OTHER MD: Lance Morin   DIAGNOSIS: Left Breast Cancer, neoadjuvant chemotherapy  CHIEF COMPLAINT: For initiation of chemotherapy   HISTORY OF PRESENT ILLNESS: Brittney Vance had routine screening mammography 03/09/2013 showing heterogeneously dense breasts, and a possible asymmetry in the left breast. Diagnostic left mammography and ultrasonography at the breast Center 03/30/2013 found a lobulated mass in the left breast upper outer quadrant measuring 1.6 cm. This was palpable. Ultrasound showed a complex microlobulated hypoechoic mass measuring 1.8 cm. There was no left axillary lymphadenopathy noted.  Biopsy of the mass in question 04/06/2013 showed ([id number]) and invasive ductal carcinoma, grade 3, triple negative, with an MIB-1 of 65%.  Bilateral breast MRI 04/13/2013 found an irregular mass measuring 2.5 cm in the left breast with a few lymph nodes that showed moderate cortical thickness, the largest measuring 12 mm (level I).  The patient's subsequent history is as detailed below  INTERVAL HISTORY: [name] returns alone today  for followup of her locally advanced left breast cancer. She is being treated neoadjuvantly, and is due for her 9th cycle of  12 planned weekly doses of carboplatin/paclitaxel today. Since the time of last visit on she has seen Dr. Zella Richer and surgical options were discussed and  was referred to plastic surgery. She denies any fever, shortness of breath, chest pains, she does complain of intermittent tingling and numbness in the upper extremities and nail changes. Her [place] today is 800 with a WBC of 1.8. She denies any headaches, blurred vision, palpitations, constipation or diarrhea  REVIEW OF SYSTEMS: A 14 review point systems have been assessed and pertinent findings are mentioned in interval history    PAST MEDICAL  HISTORY: Past Medical History  Diagnosis Date  . Fibroids   . Hypertension   . Hypothyroid   . Breast cancer     PAST SURGICAL HISTORY: Past Surgical History  Procedure Laterality Date  . Tubal ligation    . Portacath placement Right 04/27/2013    Procedure: ULTRASOUND GUIDED PORT INSERTION WITH FLUOROSCOPY;  Surgeon: [name], MD;  Location: [facility];  Service: General;  Laterality: Right;    FAMILY HISTORY Family History  Problem Relation Age of Onset  . Hypertension Mother   . Diabetes Mother   . Alzheimer's disease Mother   . Hypertension Father   . Diabetes Father   . Hypertension Brother    the patient's father died in his [id number] from complications of diabetes. The patient's mother is living, in her mid 38s. The patient had 2 brothers, and 2 sisters. There is no history of cancer in the family to her knowledge.  GYNECOLOGIC HISTORY:  Menarche age 77, first live birth age 89. The patient is GX P2. She is postmenopausal, but did have some perimenopausal bleeding, which was evaluated age 52 08/14/2012 with a transabdominal and transvaginal ultrasound of the pelvis which found a normal uterine myometrium and left ovary. The right ovary could not be visualized.  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 She's currently out of work on disability due to her breast cancer and chemotherapy. Her husband [name] also works for the [employer] system. Their children are [name], [place], 5. Jahanna's mother currently lives in the home as well. The patient attends a [employer].   ADVANCED DIRECTIVES: Not in place  HEALTH MAINTENANCE: (Updated 07/23/2013) History  Substance Use Topics  . Smoking status: Never Smoker   . Smokeless tobacco: Never Used  . Alcohol Use: 1.2 oz/week    2 Glasses of wine per week     Comment: occassion     Colonoscopy:- Never  PAP: 2013, Dr. [name]  Bone density:  Never  Lipid panel: Dr. [name]    No Known Allergies  Current Outpatient Prescriptions  Medication Sig Dispense Refill  . AMBULATORY NON FORMULARY MEDICATION Medication Name: Wig  (Medical necessity) Dx:174.4  1 Units  0  . hydrochlorothiazide (HYDRODIURIL) 25 MG tablet Take 25 mg by mouth daily after lunch.      Marland Kitchen HYDROcodone-acetaminophen (NORCO/VICODIN) 5-325 MG per tablet TAKE 1 OR 2 TABLETS BY MOUTH EVERY 6 HOURS AS NEEDED FOR PAIN  30 tablet  0  . levothyroxine (SYNTHROID, LEVOTHROID) 88 MCG tablet Take 88 mcg by mouth daily before breakfast.      . lidocaine-prilocaine (EMLA) cream Apply topically as needed. 1-2 hrs before each procedure  30 g  2  . ondansetron (ZOFRAN) 8 MG tablet Take 1 tablet (8 mg total) by mouth 2 (two) times daily as needed for nausea or vomiting.  20 tablet  1  . prochlorperazine (COMPAZINE) 10 MG tablet Take one tablet 4 times a day (ac and hs)starting the evening of chemotherapy, and continue an additional 3 days  30 tablet  2  . tobramycin-dexamethasone (TOBRADEX) ophthalmic solution Place 1 drop into both eyes 2 (two) times daily.  5 mL  0   No current facility-administered medications for this visit.    OBJECTIVE: [name] in no acute distress    Filed Vitals:   08/27/13 0900  BP: 122/87  Pulse: 118  Temp: 99 F (37.2 C)  Resp: 18     Body mass index is 24.85 kg/(m^2).    ECOG FS:1 Filed Weights   08/27/13 0900  Weight: 135 lb 14.4 oz (61.644 kg)  Physical Exam: HEENT:  Sclerae anicteric.  Oropharynx clear and moist with no ulcerations or evidence of oropharyngeal candidiasis.   Neck is supple, trachea midline. NODES:  No cervical or supraclavicular lymphadenopathy palpated.  BREAST EXAM:   Breast tissue is dense bilaterally. Right breast is unremarkable. Unable to palpate a distinct mass in the left breast today. Axillae are benign. No axillary lymphadenopathy palpated. LUNGS:  Clear to auscultation bilaterally.  No  wheezes or rhonchi.   HEART:  Regular rate and rhythm.   No murmurs. ABDOMEN:  Soft, nontender to palpation. No organomegaly or masses palpated. Positive bowel sounds.  MSK:  No focal spinal tenderness to palpation. Good range of motion bilaterally in the upper extremities.  EXTREMITIES:  No peripheral edema.   SKIN:  Hyperpigmentation noted on the palms of the hands and nailbeds bilaterally. There is no loosening of the nails from the nailbeds, no drainage, no evidence of infection. Port is intact in the right upper chest wall with no erythema and no evidence of infection/cellulitis.  NEURO:  Nonfocal. Well oriented.  Flat affect.    LAB RESULTS:  Lab Results  Component Value Date   WBC 1.8* 08/27/2013   NEUTROABS 0.8* 08/27/2013   HGB 10.7* 08/27/2013   HCT 30.5* 08/27/2013   MCV 93.6 08/27/2013   PLT 180 08/27/2013      Chemistry      Component Value Date/Time   NA 141 08/20/2013 1136   NA 136 04/11/2013 0927   K 3.6 08/20/2013 1136  K 3.9 04/11/2013 0927   CL 103 04/11/2013 0927   CO2 29 08/20/2013 1136   CO2 29 04/11/2013 0927   BUN 10.0 08/20/2013 1136   BUN 10 04/11/2013 0927   CREATININE 0.6 08/20/2013 1136   CREATININE 0.67 04/11/2013 0927   CREATININE 0.65 05/21/2009 2243      Component Value Date/Time   CALCIUM 9.9 08/20/2013 1136   CALCIUM 9.5 04/11/2013 0927   ALKPHOS 71 08/20/2013 1136   ALKPHOS 70 01/19/2013 0910   AST 26 08/20/2013 1136   AST 21 01/19/2013 0910   ALT 32 08/20/2013 1136   ALT 20 01/19/2013 0910   BILITOT 0.30 08/20/2013 1136   BILITOT 0.4 01/19/2013 0910        STUDIES: Mr Breast Bilateral W Wo Contrast  06/15/2013   CLINICAL DATA:  Biopsy-proven upper-outer quadrant left breast cancer. Question response to neoadjuvant chemotherapy.  EXAM: MR BILATERAL BREAST WITHOUT AND WITH CONTRAST  LABS:  Not applicable  TECHNIQUE: Multiplanar, multisequence MR images of both breasts were obtained prior to and following the intravenous administration of 18ml of MultiHance.   THREE-DIMENSIONAL MR IMAGE RENDERING ON INDEPENDENT WORKSTATION:  Three-dimensional MR images were rendered by post-processing of the original MR data on an independent workstation. The three-dimensional MR images were interpreted, and findings are reported in the following complete MRI report for this study.  COMPARISON:  Previous exams  FINDINGS: Breast composition: c:  Heterogeneous fibroglandular tissue  Background parenchymal enhancement: Moderate  Right breast: No mass or abnormal enhancement.  Left breast: Interval decrease in size of irregular enhancing mass in the posterior upper outer left breast. The mass measures 1.7 x 0.9 x 1.4 cm compared with 2.5 x 1.9 x 2 cm previously. No additional areas of abnormal enhancement.  Lymph nodes: No abnormal appearing lymph nodes.  Ancillary findings:  None.  IMPRESSION: Interval positive response to therapy, with biopsy proven left breast carcinoma measuring 1.7 x 0.9 x 1.4 cm compared with 2.5 x 1.9 x 2 cm previously.  RECOMMENDATION: Treatment planning.  BI-RADS CATEGORY  6: Known biopsy-proven malignancy - appropriate action should be taken.   Electronically Signed   By: Donavan Burnet M.D.   On: 06/15/2013 13:42     ASSESSMENT: 48 y.o. [place] woman   (1)  status post left breast upper outer quadrant biopsy 04/06/2013 for a clinical T2 N1, stage IIB invasive ductal carcinoma, high-grade, triple negative, with an MIB-1 of 65%  (2)  being treated in the neoadjuvant setting,   (a) completed 4 dose dense cycles of doxorubicin/ cyclophosphamide 06/11/2013,.  (b)  0n 06/25/2013 started 12 weekly cycles of carboplatin/ paclitaxel, to be followed by definitive surgery   (3) leukopenia with neutropenia secondary to chemotherapy-induced   PLAN: We'll hold ninth week of  Carboplatin/paclitaxel today, in view of neutropenia We'll arrange for  480 mcg of 2 days of Neupogen today and tomorrow  We will reschedule ninth week of chemotherapy next  Monday She was evaluated by Dr. Zella Richer last week and surgical options were discussed.she would like to proceed with mastectomy. She was referred to plastic surgery .  Breast MRI is scheduled on 09/01/2013   Follow up in 1 week with CBC differential and CMP, office visit and chemotherapy if counts are acceptable  She is in agreement with the current plan of care . She knows to call with any changes or problems prior to her next scheduled appointment.    Wilmon Arms, MD Medical oncology     08/27/2013 12:43  PM

## 2013-08-28 ENCOUNTER — Ambulatory Visit (HOSPITAL_BASED_OUTPATIENT_CLINIC_OR_DEPARTMENT_OTHER): Payer: BC Managed Care – PPO

## 2013-08-28 VITALS — BP 129/71 | HR 107 | Temp 98.5°F

## 2013-08-28 DIAGNOSIS — C50412 Malignant neoplasm of upper-outer quadrant of left female breast: Secondary | ICD-10-CM

## 2013-08-28 DIAGNOSIS — D709 Neutropenia, unspecified: Secondary | ICD-10-CM

## 2013-08-28 MED ORDER — FILGRASTIM 480 MCG/0.8ML IJ SOLN
480.0000 ug | Freq: Once | INTRAMUSCULAR | Status: AC
  Administered 2013-08-28: 480 ug via SUBCUTANEOUS
  Filled 2013-08-28: qty 0.8

## 2013-08-31 ENCOUNTER — Encounter: Payer: Self-pay | Admitting: Pharmacist

## 2013-09-03 ENCOUNTER — Encounter: Payer: Self-pay | Admitting: Physician Assistant

## 2013-09-03 ENCOUNTER — Other Ambulatory Visit (HOSPITAL_BASED_OUTPATIENT_CLINIC_OR_DEPARTMENT_OTHER): Payer: BC Managed Care – PPO

## 2013-09-03 ENCOUNTER — Telehealth: Payer: Self-pay | Admitting: Oncology

## 2013-09-03 ENCOUNTER — Other Ambulatory Visit: Payer: Self-pay | Admitting: Oncology

## 2013-09-03 ENCOUNTER — Ambulatory Visit (HOSPITAL_BASED_OUTPATIENT_CLINIC_OR_DEPARTMENT_OTHER): Payer: BC Managed Care – PPO

## 2013-09-03 ENCOUNTER — Ambulatory Visit (HOSPITAL_BASED_OUTPATIENT_CLINIC_OR_DEPARTMENT_OTHER): Payer: BC Managed Care – PPO | Admitting: Physician Assistant

## 2013-09-03 VITALS — BP 123/85 | HR 96 | Temp 98.4°F | Resp 20 | Ht 62.0 in | Wt 135.9 lb

## 2013-09-03 DIAGNOSIS — Z171 Estrogen receptor negative status [ER-]: Secondary | ICD-10-CM

## 2013-09-03 DIAGNOSIS — C50412 Malignant neoplasm of upper-outer quadrant of left female breast: Secondary | ICD-10-CM

## 2013-09-03 DIAGNOSIS — C50419 Malignant neoplasm of upper-outer quadrant of unspecified female breast: Secondary | ICD-10-CM

## 2013-09-03 DIAGNOSIS — Z5111 Encounter for antineoplastic chemotherapy: Secondary | ICD-10-CM

## 2013-09-03 DIAGNOSIS — D709 Neutropenia, unspecified: Secondary | ICD-10-CM

## 2013-09-03 DIAGNOSIS — D649 Anemia, unspecified: Secondary | ICD-10-CM

## 2013-09-03 DIAGNOSIS — T451X5A Adverse effect of antineoplastic and immunosuppressive drugs, initial encounter: Secondary | ICD-10-CM

## 2013-09-03 DIAGNOSIS — D701 Agranulocytosis secondary to cancer chemotherapy: Secondary | ICD-10-CM

## 2013-09-03 LAB — CBC WITH DIFFERENTIAL/PLATELET
BASO%: 0.5 % (ref 0.0–2.0)
Basophils Absolute: 0 10*3/uL (ref 0.0–0.1)
EOS ABS: 0 10*3/uL (ref 0.0–0.5)
EOS%: 1 % (ref 0.0–7.0)
HEMATOCRIT: 29.8 % — AB (ref 34.8–46.6)
HEMOGLOBIN: 10.2 g/dL — AB (ref 11.6–15.9)
LYMPH%: 40 % (ref 14.0–49.7)
MCH: 32.8 pg (ref 25.1–34.0)
MCHC: 34.2 g/dL (ref 31.5–36.0)
MCV: 95.8 fL (ref 79.5–101.0)
MONO#: 0.3 10*3/uL (ref 0.1–0.9)
MONO%: 15.5 % — AB (ref 0.0–14.0)
NEUT#: 0.9 10*3/uL — ABNORMAL LOW (ref 1.5–6.5)
NEUT%: 43 % (ref 38.4–76.8)
PLATELETS: 256 10*3/uL (ref 145–400)
RBC: 3.11 10*6/uL — ABNORMAL LOW (ref 3.70–5.45)
RDW: 15.8 % — AB (ref 11.2–14.5)
WBC: 2 10*3/uL — ABNORMAL LOW (ref 3.9–10.3)
lymph#: 0.8 10*3/uL — ABNORMAL LOW (ref 0.9–3.3)
nRBC: 0 % (ref 0–0)

## 2013-09-03 LAB — COMPREHENSIVE METABOLIC PANEL (CC13)
ALK PHOS: 71 U/L (ref 40–150)
ALT: 23 U/L (ref 0–55)
AST: 23 U/L (ref 5–34)
Albumin: 3.8 g/dL (ref 3.5–5.0)
Anion Gap: 8 mEq/L (ref 3–11)
BILIRUBIN TOTAL: 0.27 mg/dL (ref 0.20–1.20)
BUN: 8.9 mg/dL (ref 7.0–26.0)
CO2: 25 mEq/L (ref 22–29)
CREATININE: 0.6 mg/dL (ref 0.6–1.1)
Calcium: 9.6 mg/dL (ref 8.4–10.4)
Chloride: 110 mEq/L — ABNORMAL HIGH (ref 98–109)
Glucose: 113 mg/dl (ref 70–140)
Potassium: 3.7 mEq/L (ref 3.5–5.1)
Sodium: 143 mEq/L (ref 136–145)
Total Protein: 6.7 g/dL (ref 6.4–8.3)

## 2013-09-03 MED ORDER — ONDANSETRON 16 MG/50ML IVPB (CHCC)
16.0000 mg | Freq: Once | INTRAVENOUS | Status: AC
  Administered 2013-09-03: 16 mg via INTRAVENOUS

## 2013-09-03 MED ORDER — FAMOTIDINE IN NACL 20-0.9 MG/50ML-% IV SOLN
20.0000 mg | Freq: Once | INTRAVENOUS | Status: AC
  Administered 2013-09-03: 20 mg via INTRAVENOUS

## 2013-09-03 MED ORDER — HEPARIN SOD (PORK) LOCK FLUSH 100 UNIT/ML IV SOLN
500.0000 [IU] | Freq: Once | INTRAVENOUS | Status: AC | PRN
  Administered 2013-09-03: 500 [IU]
  Filled 2013-09-03: qty 5

## 2013-09-03 MED ORDER — SODIUM CHLORIDE 0.9 % IV SOLN
278.0000 mg | Freq: Once | INTRAVENOUS | Status: AC
  Administered 2013-09-03: 280 mg via INTRAVENOUS
  Filled 2013-09-03: qty 28

## 2013-09-03 MED ORDER — SODIUM CHLORIDE 0.9 % IV SOLN
80.0000 mg/m2 | Freq: Once | INTRAVENOUS | Status: AC
  Administered 2013-09-03: 132 mg via INTRAVENOUS
  Filled 2013-09-03: qty 22

## 2013-09-03 MED ORDER — DIPHENHYDRAMINE HCL 50 MG/ML IJ SOLN
50.0000 mg | Freq: Once | INTRAMUSCULAR | Status: AC
  Administered 2013-09-03: 50 mg via INTRAVENOUS

## 2013-09-03 MED ORDER — CIPROFLOXACIN HCL 500 MG PO TABS: 500.0000 mg | ORAL_TABLET | Freq: Two times a day (BID) | ORAL | Status: DC

## 2013-09-03 MED ORDER — DIPHENHYDRAMINE HCL 50 MG/ML IJ SOLN
INTRAMUSCULAR | Status: AC
  Filled 2013-09-03: qty 1

## 2013-09-03 MED ORDER — SODIUM CHLORIDE 0.9 % IJ SOLN
10.0000 mL | INTRAMUSCULAR | Status: DC | PRN
  Administered 2013-09-03: 10 mL
  Filled 2013-09-03: qty 10

## 2013-09-03 MED ORDER — DEXAMETHASONE SODIUM PHOSPHATE 20 MG/5ML IJ SOLN
INTRAMUSCULAR | Status: AC
  Filled 2013-09-03: qty 5

## 2013-09-03 MED ORDER — ONDANSETRON 16 MG/50ML IVPB (CHCC)
INTRAVENOUS | Status: AC
  Filled 2013-09-03: qty 16

## 2013-09-03 MED ORDER — SODIUM CHLORIDE 0.9 % IV SOLN
Freq: Once | INTRAVENOUS | Status: AC
  Administered 2013-09-03: 12:00:00 via INTRAVENOUS

## 2013-09-03 MED ORDER — FAMOTIDINE IN NACL 20-0.9 MG/50ML-% IV SOLN
INTRAVENOUS | Status: AC
Start: 2013-09-03 — End: 2013-09-03
  Filled 2013-09-03: qty 50

## 2013-09-03 MED ORDER — DEXAMETHASONE SODIUM PHOSPHATE 20 MG/5ML IJ SOLN
20.0000 mg | Freq: Once | INTRAMUSCULAR | Status: AC
  Administered 2013-09-03: 20 mg via INTRAVENOUS

## 2013-09-03 NOTE — Patient Instructions (Signed)
[facility] Discharge Instructions for Patients Receiving Chemotherapy  Today you received the following chemotherapy agents: taxol, carboplatin  To help prevent nausea and vomiting after your treatment, we encourage you to take your nausea medication.  Take it as often as prescribed.     If you develop nausea and vomiting that is not controlled by your nausea medication, call the clinic. If it is after clinic hours your family physician or the after hours number for the clinic or go to the Emergency Department.   BELOW ARE SYMPTOMS THAT SHOULD BE REPORTED IMMEDIATELY:  *FEVER GREATER THAN 100.5 F  *CHILLS WITH OR WITHOUT FEVER  NAUSEA AND VOMITING THAT IS NOT CONTROLLED WITH YOUR NAUSEA MEDICATION  *UNUSUAL SHORTNESS OF BREATH  *UNUSUAL BRUISING OR BLEEDING  TENDERNESS IN MOUTH AND THROAT WITH OR WITHOUT PRESENCE OF ULCERS  *URINARY PROBLEMS  *BOWEL PROBLEMS  UNUSUAL RASH Items with * indicate a potential emergency and should be followed up as soon as possible.  Feel free to call the clinic you have any questions or concerns. The clinic phone number is [phone].   I have been informed and understand all the instructions given to me. I know to contact the clinic, my physician, or go to the Emergency Department if any problems should occur. I do not have any questions at this time, but understand that I may call the clinic during office hours   should I have any questions or need assistance in obtaining follow up care.    __________________________________________  _____________  __________ Signature of Patient or Authorized Representative            Date                   Time    __________________________________________ Nurse's Signature    

## 2013-09-03 NOTE — Progress Notes (Signed)
ID: Newell Coral Clack OB: May 30, 1966  MR#: [id number]  [id number]  PCP: Beatrice Lecher, MD GYN:  Veneda Melter SU: [name]; [name] OTHER MD: [name]  CHIEF COMPLAINT:  Left Breast Cancer, neoadjuvant chemotherapy  HISTORY OF PRESENT ILLNESS: Ralyn had routine screening mammography 03/09/2013 showing heterogeneously dense breasts, and a possible asymmetry in the left breast. Diagnostic left mammography and ultrasonography at the breast Center 03/30/2013 found a lobulated mass in the left breast upper outer quadrant measuring 1.6 cm. This was palpable. Ultrasound showed a complex microlobulated hypoechoic mass measuring 1.8 cm. There was no left axillary lymphadenopathy noted.  Biopsy of the mass in question 04/06/2013 showed ([id number]) and invasive ductal carcinoma, grade 3, triple negative, with an MIB-1 of 65%.  Bilateral breast MRI 04/13/2013 found an irregular mass measuring 2.5 cm in the left breast with a few lymph nodes that showed moderate cortical thickness, the largest measuring 12 mm (level I).  The patient's subsequent history is as detailed below  INTERVAL HISTORY: Soha returns alone today  for followup of her locally advanced left breast cancer. She is being treated neoadjuvantly, and is due for her 9th of 12 planned weekly doses of carboplatin/paclitaxel today.  This is after a one-week delay, treatment having been held last week on 08/27/2013 due to neutropenia and an ANC of 0.8. Rukia is status post 2 injections of Neupogen, 480 mcg each, on February 2 and February 3. Her neutrophils have increased only slightly, up to 0.9 today. Fortunately, she denies any fevers, chills, or night sweats. She tolerated the injections well with no bony aches or pains.  Jaquelinne's last living aunt who resides in [place] is in the  ICU in [place], and is not expected to live. Donnajean is planning to take her mother to [place] later this week to visit, likely Friday  through Sunday.   REVIEW OF SYSTEMS: Akia denies any recent illnesses. Her skin and nail beds are hyperpigmented, but she's had no additional skin changes or rashes. She continues to have some intermittent itching with no associated skin changes or rashes. This has occurred over the last couple of days, even 2 weeks after her last infusion of chemotherapy, and I do not believe this is going to be associated with those drugs. She's had no abnormal bruising or abnormal bleeding. She denies any oral ulcerations or sensitivity. She's had no nausea or emesis and denies any change in bowel or bladder habits. She's had no increased cough, increased shortness of breath, chest pain, or palpitations. She denies any abnormal headaches or dizziness, and also denies any current myalgias, arthralgias, bony pain, or peripheral swelling. She tells me the neuropathy is "fine" and has improved since last week.  She denies any signs of numbness or tingling in the upper or lower extremities today.  A detailed review of systems is otherwise stable and noncontributory.   PAST MEDICAL HISTORY: Past Medical History  Diagnosis Date  . Fibroids   . Hypertension   . Hypothyroid   . Breast cancer     PAST SURGICAL HISTORY: Past Surgical History  Procedure Laterality Date  . Tubal ligation    . Portacath placement Right 04/27/2013    Procedure: ULTRASOUND GUIDED PORT INSERTION WITH FLUOROSCOPY;  Surgeon: [name], MD;  Location: [facility];  Service: General;  Laterality: Right;    FAMILY HISTORY Family History  Problem Relation Age of Onset  . Hypertension Mother   . Diabetes Mother   . Alzheimer's disease Mother   .  Hypertension Father   . Diabetes Father   . Hypertension Brother    the patient's father died in his 66Y from complications of diabetes. The patient's mother is living, in her mid 67s. The patient had 2 brothers, and 2 sisters. There is no history of cancer in the family to her  knowledge.  GYNECOLOGIC HISTORY:  Menarche age 46, first live birth age 36. The patient is GX P2. She is postmenopausal, but did have some perimenopausal bleeding, which was evaluated age 65 08/14/2012 with a transabdominal and transvaginal ultrasound of the pelvis which found a normal uterine myometrium and left ovary. The right ovary could not be visualized.  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 She's currently out of work on disability due to her breast cancer and chemotherapy. Her husband [name] also works for the [employer] system. Their children are [name], [place], 5. Reianna's mother currently lives in the home as well. The patient attends a [employer].   ADVANCED DIRECTIVES: Not in place   HEALTH MAINTENANCE: (Updated 09/03/2013) History  Substance Use Topics  . Smoking status: Never Smoker   . Smokeless tobacco: Never Used  . Alcohol Use: 1.2 oz/week    2 Glasses of wine per week     Comment: occassion     Colonoscopy:- Never  PAP: 2013, Dr. [name]  Bone density: Never  Lipid panel: Dr. [name]    No Known Allergies  Current Outpatient Prescriptions  Medication Sig Dispense Refill  . hydrochlorothiazide (HYDRODIURIL) 25 MG tablet Take 25 mg by mouth daily after lunch.      Marland Kitchen HYDROcodone-acetaminophen (NORCO/VICODIN) 5-325 MG per tablet TAKE 1 OR 2 TABLETS BY MOUTH EVERY 6 HOURS AS NEEDED FOR PAIN  30 tablet  0  . levothyroxine (SYNTHROID, LEVOTHROID) 88 MCG tablet Take 88 mcg by mouth daily before breakfast.      . lidocaine-prilocaine (EMLA) cream Apply topically as needed. 1-2 hrs before each procedure  30 g  2  . AMBULATORY NON FORMULARY MEDICATION Medication Name: Wig  (Medical necessity) Dx:174.4  1 Units  0  . ciprofloxacin (CIPRO) 500 MG tablet Take 1 tablet (500 mg total) by mouth 2 (two) times daily.  14 tablet  0  . ondansetron (ZOFRAN) 8 MG tablet Take 1 tablet (8  mg total) by mouth 2 (two) times daily as needed for nausea or vomiting.  20 tablet  1  . prochlorperazine (COMPAZINE) 10 MG tablet Take one tablet 4 times a day (ac and hs)starting the evening of chemotherapy, and continue an additional 3 days  30 tablet  2  . tobramycin-dexamethasone (TOBRADEX) ophthalmic solution Place 1 drop into both eyes 2 (two) times daily.  5 mL  0   No current facility-administered medications for this visit.   Facility-Administered Medications Ordered in Other Visits  Medication Dose Route Frequency Provider Last Rate Last Dose  . CARBOplatin (PARAPLATIN) 280 mg in sodium chloride 0.9 % 100 mL chemo infusion  280 mg Intravenous Once Braelynne Garinger G Horris Speros, PA-C      . heparin lock flush 100 unit/mL  500 Units Intracatheter Once PRN Kaetlin Bullen Milda Smart, PA-C      . PACLitaxel (TAXOL) 132 mg in sodium chloride 0.9 % 250 mL chemo infusion (</= 80mg /m2)  80 mg/m2 (Treatment Plan Actual) Intravenous Once Mashelle Busick G Jozlin Bently, PA-C      . sodium chloride 0.9 % injection 10 mL  10 mL  Intracatheter PRN Savan Ruta Milda Smart, PA-C        OBJECTIVE: [name] in no acute distress    Filed Vitals:   09/03/13 1030  BP: 123/85  Pulse: 96  Temp: 98.4 F (36.9 C)  Resp: 20     Body mass index is 24.85 kg/(m^2).    ECOG FS:1 Filed Weights   09/03/13 1030  Weight: 135 lb 14.4 oz (61.644 kg)  Physical Exam: HEENT:  Sclerae anicteric.  Oropharynx clear and moist.  No mucositis or candidiasis noted.  Neck is supple, trachea midline. NODES:  No cervical or supraclavicular lymphadenopathy palpated.  BREAST EXAM:    Deferred.  Axillae are benign. No axillary lymphadenopathy palpated. LUNGS:  Clear to auscultation bilaterally.  No wheezes, rales,  or rhonchi.   HEART:  Regular rate and rhythm.   No murmurs. ABDOMEN:  Soft, nontender to palpation. No organomegaly or masses palpated. Positive, normoactive  bowel sounds.  MSK:  No focal spinal tenderness to palpation. Good range of motion bilaterally  in the upper extremities.  EXTREMITIES:  No peripheral edema.   SKIN:  Hyperpigmentation noted on the hands and nailbeds bilaterally. There is no drainage around the nails, and no evidence of infection. Port is intact in the right upper chest wall with no erythema and no evidence of infection/cellulitis.  NEURO:  Nonfocal. Well oriented.  Flat affect.    LAB RESULTS:  Lab Results  Component Value Date   WBC 2.0* 09/03/2013   NEUTROABS 0.9* 09/03/2013   HGB 10.2* 09/03/2013   HCT 29.8* 09/03/2013   MCV 95.8 09/03/2013   PLT 256 09/03/2013      Chemistry      Component Value Date/Time   NA 143 09/03/2013 1015   NA 136 04/11/2013 0927   K 3.7 09/03/2013 1015   K 3.9 04/11/2013 0927   CL 103 04/11/2013 0927   CO2 25 09/03/2013 1015   CO2 29 04/11/2013 0927   BUN 8.9 09/03/2013 1015   BUN 10 04/11/2013 0927   CREATININE 0.6 09/03/2013 1015   CREATININE 0.67 04/11/2013 0927   CREATININE 0.65 05/21/2009 2243      Component Value Date/Time   CALCIUM 9.6 09/03/2013 1015   CALCIUM 9.5 04/11/2013 0927   ALKPHOS 71 09/03/2013 1015   ALKPHOS 70 01/19/2013 0910   AST 23 09/03/2013 1015   AST 21 01/19/2013 0910   ALT 23 09/03/2013 1015   ALT 20 01/19/2013 0910   BILITOT 0.27 09/03/2013 1015   BILITOT 0.4 01/19/2013 0910        STUDIES: Mr Breast Bilateral W Wo Contrast  06/15/2013   CLINICAL DATA:  Biopsy-proven upper-outer quadrant left breast cancer. Question response to neoadjuvant chemotherapy.  EXAM: MR BILATERAL BREAST WITHOUT AND WITH CONTRAST  LABS:  Not applicable  TECHNIQUE: Multiplanar, multisequence MR images of both breasts were obtained prior to and following the intravenous administration of 67ml of MultiHance.  THREE-DIMENSIONAL MR IMAGE RENDERING ON INDEPENDENT WORKSTATION:  Three-dimensional MR images were rendered by post-processing of the original MR data on an independent workstation. The three-dimensional MR images were interpreted, and findings are reported in the following complete MRI report for  this study.  COMPARISON:  Previous exams  FINDINGS: Breast composition: c:  Heterogeneous fibroglandular tissue  Background parenchymal enhancement: Moderate  Right breast: No mass or abnormal enhancement.  Left breast: Interval decrease in size of irregular enhancing mass in the posterior upper outer left breast. The mass measures 1.7 x 0.9 x 1.4  cm compared with 2.5 x 1.9 x 2 cm previously. No additional areas of abnormal enhancement.  Lymph nodes: No abnormal appearing lymph nodes.  Ancillary findings:  None.  IMPRESSION: Interval positive response to therapy, with biopsy proven left breast carcinoma measuring 1.7 x 0.9 x 1.4 cm compared with 2.5 x 1.9 x 2 cm previously.  RECOMMENDATION: Treatment planning.  BI-RADS CATEGORY  6: Known biopsy-proven malignancy - appropriate action should be taken.   Electronically Signed   By: Donavan Burnet M.D.   On: 06/15/2013 13:42     ASSESSMENT: 48 y.o. [place] woman   (1)  status post left breast upper outer quadrant biopsy 04/06/2013 for a clinical T2 N1, stage IIB invasive ductal carcinoma, high-grade, triple negative, with an MIB-1 of 65%  (2)  being treated in the neoadjuvant setting,   (a) completed 4 dose dense cycles of doxorubicin/ cyclophosphamide 06/11/2013,.  (b)  0n 06/25/2013 started 12 weekly cycles of carboplatin/ paclitaxel, to be followed by definitive surgery    PLAN: This case was reviewed with Dr. [name] Hakim today, and he has agreed for polyps receive her ninth weekly dose of paclitaxel/carboplatin today as scheduled. We will plan on her receiving 3 daily Neupogen injections this week, 480 mcg each, given on Tuesday, Wednesday, and Thursday prior to leaving for [place] on Friday.   Since she is going out of town, with an [place] of 0.9, I am also going to cover her with Cipro prophylactically, 500 mg by mouth twice a day for 7 days.  She will follow neutropenic precautions, and I encouraged her to wear a mask on the airplane. She  certainly would need to let us know if she has any fevers of 100 or above.  We will see Rehema back in one week, February 16, for followup in anticipation of her 10th weekly dose of neoadjuvant paclitaxel/carboplatin. If she continues to tolerate treatment well and we are able to proceed with all 12 planned weekly doses, her final chemotherapy will be on March 2. She'll be ready for surgery approximately 4 weeks later.    She is already scheduled for breast MRI just prior to that final chemotherapy on January 27, and we will keep her schedule as it stands. She will need to see Dr. Zella Richer again in early March to discuss her definitive surgery.  This plan was reviewed with [name] today, and she  voiced her understanding and agreement. She knows to call with any changes or problems prior to her next scheduled appointment.   Taleia Sadowski, PA-C   09/03/2013 1:13 PM

## 2013-09-03 NOTE — Progress Notes (Signed)
OK to treat with lab values of 2/9 per Dr. [name] Hakim.  Pt to receive neupogen x3 this week.

## 2013-09-04 ENCOUNTER — Telehealth: Payer: Self-pay | Admitting: Oncology

## 2013-09-04 ENCOUNTER — Ambulatory Visit (HOSPITAL_BASED_OUTPATIENT_CLINIC_OR_DEPARTMENT_OTHER): Payer: BC Managed Care – PPO

## 2013-09-04 ENCOUNTER — Telehealth: Payer: Self-pay | Admitting: *Deleted

## 2013-09-04 VITALS — BP 136/92 | HR 112 | Temp 98.7°F

## 2013-09-04 DIAGNOSIS — C50419 Malignant neoplasm of upper-outer quadrant of unspecified female breast: Secondary | ICD-10-CM

## 2013-09-04 DIAGNOSIS — D709 Neutropenia, unspecified: Secondary | ICD-10-CM

## 2013-09-04 DIAGNOSIS — C50412 Malignant neoplasm of upper-outer quadrant of left female breast: Secondary | ICD-10-CM

## 2013-09-04 MED ORDER — TBO-FILGRASTIM 480 MCG/0.8ML ~~LOC~~ SOSY
480.0000 ug | PREFILLED_SYRINGE | Freq: Once | SUBCUTANEOUS | Status: AC
  Administered 2013-09-04: 480 ug via SUBCUTANEOUS
  Filled 2013-09-04: qty 0.8

## 2013-09-04 NOTE — Telephone Encounter (Signed)
, °

## 2013-09-04 NOTE — Telephone Encounter (Signed)
Patient called regarding her appt times, I have called her back. I left her a message to call me.

## 2013-09-05 ENCOUNTER — Ambulatory Visit: Payer: BC Managed Care – PPO

## 2013-09-05 ENCOUNTER — Telehealth (INDEPENDENT_AMBULATORY_CARE_PROVIDER_SITE_OTHER): Payer: Self-pay | Admitting: General Surgery

## 2013-09-05 ENCOUNTER — Ambulatory Visit (HOSPITAL_BASED_OUTPATIENT_CLINIC_OR_DEPARTMENT_OTHER): Payer: BC Managed Care – PPO

## 2013-09-05 VITALS — BP 115/71 | HR 108 | Temp 98.0°F

## 2013-09-05 DIAGNOSIS — C50412 Malignant neoplasm of upper-outer quadrant of left female breast: Secondary | ICD-10-CM

## 2013-09-05 DIAGNOSIS — D709 Neutropenia, unspecified: Secondary | ICD-10-CM

## 2013-09-05 DIAGNOSIS — C50419 Malignant neoplasm of upper-outer quadrant of unspecified female breast: Secondary | ICD-10-CM

## 2013-09-05 MED ORDER — TBO-FILGRASTIM 480 MCG/0.8ML ~~LOC~~ SOSY
480.0000 ug | PREFILLED_SYRINGE | Freq: Once | SUBCUTANEOUS | Status: AC
  Administered 2013-09-05: 480 ug via SUBCUTANEOUS
  Filled 2013-09-05: qty 0.8

## 2013-09-05 NOTE — Telephone Encounter (Signed)
Pt called was transferred to surgery scheduling wanted to speak w Dr Zella Richer nurse. I told her i would gladly get a message to her and Dr Zella Richer.  She states that her last chemo is 3/2 and wants to know when her coordination case with Harlow Mares will be scheduled she is coming in to see Dr Zella Richer 09/26/13

## 2013-09-05 NOTE — Telephone Encounter (Signed)
I will discuss it with her during her appointment.

## 2013-09-06 ENCOUNTER — Ambulatory Visit (HOSPITAL_BASED_OUTPATIENT_CLINIC_OR_DEPARTMENT_OTHER): Payer: BC Managed Care – PPO

## 2013-09-06 ENCOUNTER — Telehealth: Payer: Self-pay | Admitting: *Deleted

## 2013-09-06 VITALS — BP 122/70 | HR 117 | Temp 98.4°F

## 2013-09-06 DIAGNOSIS — Z5189 Encounter for other specified aftercare: Secondary | ICD-10-CM

## 2013-09-06 DIAGNOSIS — C50419 Malignant neoplasm of upper-outer quadrant of unspecified female breast: Secondary | ICD-10-CM

## 2013-09-06 DIAGNOSIS — C50412 Malignant neoplasm of upper-outer quadrant of left female breast: Secondary | ICD-10-CM

## 2013-09-06 DIAGNOSIS — D709 Neutropenia, unspecified: Secondary | ICD-10-CM

## 2013-09-06 MED ORDER — TBO-FILGRASTIM 480 MCG/0.8ML ~~LOC~~ SOSY
480.0000 ug | PREFILLED_SYRINGE | Freq: Once | SUBCUTANEOUS | Status: AC
  Administered 2013-09-06: 480 ug via SUBCUTANEOUS
  Filled 2013-09-06: qty 0.8

## 2013-09-06 NOTE — Telephone Encounter (Signed)
Per staff voicemail I have scheduled appts. I have called and gave patient the appts

## 2013-09-06 NOTE — Patient Instructions (Signed)
Tbo-Filgrastim injection What is this medicine? TBO-FILGRASTIM is used to help decrease the time you have low amounts of white blood cells after cancer treatment. It helps the body make more white blood cells. Increasing the amount of white blood cells helps to decrease the risk of infection and fever. This medicine may be used for other purposes; ask your health care provider or pharmacist if you have questions. COMMON BRAND NAME(S): Granix What should I tell my health care provider before I take this medicine? They need to know if you have any of these conditions: -history of blood diseases, like sickle cell anemia or leukemia -an unusual or allergic reaction to tbo-filgrastim, filgrastim, pegfilgrastim, other medicines, foods, dyes, or preservatives -pregnant or trying to get pregnant -breast-feeding How should I use this medicine? This medicine is for injection under the skin. It is given by a health care professional in a hospital or clinic setting. Talk to your pediatrician regarding the use of this medicine in children. Special care may be needed. Overdosage: If you think you've taken too much of this medicine contact a poison control center or emergency room at once. Overdosage: If you think you have taken too much of this medicine contact a poison control center or emergency room at once. NOTE: This medicine is only for you. Do not share this medicine with others. What if I miss a dose? Keep appointments for follow-up doses as directed. It is important not to miss your dose. Call your doctor or health care professional if you are unable to keep an appointment. What may interact with this medicine? -lithium This list may not describe all possible interactions. Give your health care provider a list of all the medicines, herbs, non-prescription drugs, or dietary supplements you use. Also tell them if you smoke, drink alcohol, or use illegal drugs. Some items may interact with your  medicine. What should I watch for while using this medicine? Your condition will be monitored carefully while you are receiving this medicine. You may need blood work done while you are taking this medicine. Avoid taking products that contain aspirin, acetaminophen, ibuprofen, naproxen, or ketoprofen unless instructed by your doctor. These medicines may hide a fever. Call your doctor or health care professional for advice if you get a fever, chills or sore throat, or other symptoms of a cold or flu. Do not treat yourself. What side effects may I notice from receiving this medicine? Side effects that you should report to your doctor or health care professional as soon as possible: -allergic reactions like skin rash, itching or hives, swelling of the face, lips, or tongue -breathing problems -fever -stomach pain  Side effects that usually do not require medical attention (Report these to your doctor or health care professional if they continue or are bothersome.): -bone pain This list may not describe all possible side effects. Call your doctor for medical advice about side effects. You may report side effects to FDA at 1-800-FDA-1088. Where should I keep my medicine? This drug is given in a hospital or clinic and will not be stored at home. NOTE: This sheet is a summary. It may not cover all possible information. If you have questions about this medicine, talk to your doctor, pharmacist, or health care provider.  2014, Elsevier/Gold Standard. (2011-03-31 16:41:24)  

## 2013-09-10 ENCOUNTER — Ambulatory Visit (HOSPITAL_BASED_OUTPATIENT_CLINIC_OR_DEPARTMENT_OTHER): Payer: BC Managed Care – PPO | Admitting: Physician Assistant

## 2013-09-10 ENCOUNTER — Other Ambulatory Visit: Payer: BC Managed Care – PPO

## 2013-09-10 ENCOUNTER — Other Ambulatory Visit: Payer: Self-pay | Admitting: Physician Assistant

## 2013-09-10 ENCOUNTER — Ambulatory Visit (HOSPITAL_BASED_OUTPATIENT_CLINIC_OR_DEPARTMENT_OTHER): Payer: BC Managed Care – PPO

## 2013-09-10 ENCOUNTER — Encounter: Payer: Self-pay | Admitting: Physician Assistant

## 2013-09-10 ENCOUNTER — Other Ambulatory Visit (HOSPITAL_BASED_OUTPATIENT_CLINIC_OR_DEPARTMENT_OTHER): Payer: BC Managed Care – PPO

## 2013-09-10 VITALS — BP 126/86 | HR 104 | Temp 97.8°F | Resp 18

## 2013-09-10 DIAGNOSIS — D709 Neutropenia, unspecified: Secondary | ICD-10-CM

## 2013-09-10 DIAGNOSIS — D649 Anemia, unspecified: Secondary | ICD-10-CM

## 2013-09-10 DIAGNOSIS — Z171 Estrogen receptor negative status [ER-]: Secondary | ICD-10-CM

## 2013-09-10 DIAGNOSIS — C50412 Malignant neoplasm of upper-outer quadrant of left female breast: Secondary | ICD-10-CM

## 2013-09-10 DIAGNOSIS — Z5111 Encounter for antineoplastic chemotherapy: Secondary | ICD-10-CM

## 2013-09-10 DIAGNOSIS — C50419 Malignant neoplasm of upper-outer quadrant of unspecified female breast: Secondary | ICD-10-CM

## 2013-09-10 LAB — CBC WITH DIFFERENTIAL/PLATELET
BASO%: 1.1 % (ref 0.0–2.0)
Basophils Absolute: 0 10*3/uL (ref 0.0–0.1)
EOS%: 0.7 % (ref 0.0–7.0)
Eosinophils Absolute: 0 10*3/uL (ref 0.0–0.5)
HEMATOCRIT: 32.4 % — AB (ref 34.8–46.6)
HGB: 11 g/dL — ABNORMAL LOW (ref 11.6–15.9)
LYMPH%: 43.4 % (ref 14.0–49.7)
MCH: 32.7 pg (ref 25.1–34.0)
MCHC: 34 g/dL (ref 31.5–36.0)
MCV: 96.4 fL (ref 79.5–101.0)
MONO#: 0.6 10*3/uL (ref 0.1–0.9)
MONO%: 22.2 % — ABNORMAL HIGH (ref 0.0–14.0)
NEUT#: 0.9 10*3/uL — ABNORMAL LOW (ref 1.5–6.5)
NEUT%: 32.6 % — AB (ref 38.4–76.8)
PLATELETS: 265 10*3/uL (ref 145–400)
RBC: 3.36 10*6/uL — ABNORMAL LOW (ref 3.70–5.45)
RDW: 15.5 % — ABNORMAL HIGH (ref 11.2–14.5)
WBC: 2.8 10*3/uL — ABNORMAL LOW (ref 3.9–10.3)
lymph#: 1.2 10*3/uL (ref 0.9–3.3)
nRBC: 0 % (ref 0–0)

## 2013-09-10 MED ORDER — SODIUM CHLORIDE 0.9 % IJ SOLN
10.0000 mL | INTRAMUSCULAR | Status: DC | PRN
  Administered 2013-09-10: 10 mL
  Filled 2013-09-10: qty 10

## 2013-09-10 MED ORDER — SODIUM CHLORIDE 0.9 % IV SOLN
80.0000 mg/m2 | Freq: Once | INTRAVENOUS | Status: AC
  Administered 2013-09-10: 132 mg via INTRAVENOUS
  Filled 2013-09-10: qty 22

## 2013-09-10 MED ORDER — ONDANSETRON 16 MG/50ML IVPB (CHCC)
INTRAVENOUS | Status: AC
  Filled 2013-09-10: qty 16

## 2013-09-10 MED ORDER — DEXAMETHASONE SODIUM PHOSPHATE 20 MG/5ML IJ SOLN
INTRAMUSCULAR | Status: AC
  Filled 2013-09-10: qty 5

## 2013-09-10 MED ORDER — SODIUM CHLORIDE 0.9 % IV SOLN
Freq: Once | INTRAVENOUS | Status: AC
  Administered 2013-09-10: 11:00:00 via INTRAVENOUS

## 2013-09-10 MED ORDER — HEPARIN SOD (PORK) LOCK FLUSH 100 UNIT/ML IV SOLN
500.0000 [IU] | Freq: Once | INTRAVENOUS | Status: AC | PRN
  Administered 2013-09-10: 500 [IU]
  Filled 2013-09-10: qty 5

## 2013-09-10 MED ORDER — FAMOTIDINE IN NACL 20-0.9 MG/50ML-% IV SOLN
20.0000 mg | Freq: Once | INTRAVENOUS | Status: AC
  Administered 2013-09-10: 20 mg via INTRAVENOUS

## 2013-09-10 MED ORDER — DIPHENHYDRAMINE HCL 50 MG/ML IJ SOLN
50.0000 mg | Freq: Once | INTRAMUSCULAR | Status: AC
  Administered 2013-09-10: 50 mg via INTRAVENOUS

## 2013-09-10 MED ORDER — SODIUM CHLORIDE 0.9 % IV SOLN
278.0000 mg | Freq: Once | INTRAVENOUS | Status: AC
  Administered 2013-09-10: 280 mg via INTRAVENOUS
  Filled 2013-09-10: qty 28

## 2013-09-10 MED ORDER — ONDANSETRON 16 MG/50ML IVPB (CHCC)
16.0000 mg | Freq: Once | INTRAVENOUS | Status: AC
  Administered 2013-09-10: 16 mg via INTRAVENOUS

## 2013-09-10 MED ORDER — DEXAMETHASONE SODIUM PHOSPHATE 20 MG/5ML IJ SOLN
20.0000 mg | Freq: Once | INTRAMUSCULAR | Status: AC
  Administered 2013-09-10: 20 mg via INTRAVENOUS

## 2013-09-10 MED ORDER — FAMOTIDINE IN NACL 20-0.9 MG/50ML-% IV SOLN
INTRAVENOUS | Status: AC
  Filled 2013-09-10: qty 50

## 2013-09-10 MED ORDER — DIPHENHYDRAMINE HCL 50 MG/ML IJ SOLN
INTRAMUSCULAR | Status: AC
  Filled 2013-09-10: qty 1

## 2013-09-10 NOTE — Progress Notes (Signed)
OK to tx per Micah Flesher PA, plan neupogen 480 mg x3 doses.

## 2013-09-10 NOTE — Progress Notes (Signed)
ID: Newell Coral Earnhart OB: 23-Nov-1965  MR#: [id number]  [id number]  PCP: Beatrice Lecher, MD GYN:  Veneda Melter SU: [name]; [name] OTHER MD: [name]  CHIEF COMPLAINT:  Left Breast Cancer, neoadjuvant chemotherapy  HISTORY OF PRESENT ILLNESS: Keyawna had routine screening mammography 03/09/2013 showing heterogeneously dense breasts, and a possible asymmetry in the left breast. Diagnostic left mammography and ultrasonography at the breast Center 03/30/2013 found a lobulated mass in the left breast upper outer quadrant measuring 1.6 cm. This was palpable. Ultrasound showed a complex microlobulated hypoechoic mass measuring 1.8 cm. There was no left axillary lymphadenopathy noted.  Biopsy of the mass in question 04/06/2013 showed ([id number]) and invasive ductal carcinoma, grade 3, triple negative, with an MIB-1 of 65%.  Bilateral breast MRI 04/13/2013 found an irregular mass measuring 2.5 cm in the left breast with a few lymph nodes that showed moderate cortical thickness, the largest measuring 12 mm (level I).  The patient's subsequent history is as detailed below  INTERVAL HISTORY: Vickey returns alone today for followup of her locally advanced left breast cancer and is seen in the infusion room while she is receiving her 10th weekly dose of neoadjuvant carboplatin/paclitaxel. Talula's counts remain stable, with an ANC of 0.9 today. She is status post 3 Neupogen injections last week, 480 micrograms each. She has had no problems with fevers, chills, or night sweats. She tolerated the injections well, and denies any associated bone pain.  In fact, Jazalyn is actually feeling quite well today. She was able to travel to [place] last weekend to visit her aunt. Her energy level is fair. She has had less itching this past week and denies any associated skin changes or rashes. She continues to deny any signs of peripheral neuropathy in either the upper or lower extremities.     REVIEW OF SYSTEMS: Jillanne denies any recent illnesses.  she continues to have darkening of the skin and nailbeds, but no tenderness, no cracking or peeling, and no drainage from beneath the nails. She's eating and drinking well denies any nausea or emesis. She's had no change in bowel or bladder habits. She denies any oral ulcerations or oral sensitivity. She's had no increased cough, increased shortness of breath, chest pain, or palpitations. She denies any abnormal headaches, change in vision,  or dizzines. She's had no new or unusual myalgias, arthralgias, or bony pain, and denies any peripheral swelling.   A detailed review of systems is otherwise stable and noncontributory.   PAST MEDICAL HISTORY: Past Medical History  Diagnosis Date  . Fibroids   . Hypertension   . Hypothyroid   . Breast cancer     PAST SURGICAL HISTORY: Past Surgical History  Procedure Laterality Date  . Tubal ligation    . Portacath placement Right 04/27/2013    Procedure: ULTRASOUND GUIDED PORT INSERTION WITH FLUOROSCOPY;  Surgeon: [name], MD;  Location: [facility];  Service: General;  Laterality: Right;    FAMILY HISTORY Family History  Problem Relation Age of Onset  . Hypertension Mother   . Diabetes Mother   . Alzheimer's disease Mother   . Hypertension Father   . Diabetes Father   . Hypertension Brother    the patient's father died in his [id number] from complications of diabetes. The patient's mother is living, in her mid 40s. The patient had 2 brothers, and 2 sisters. There is no history of cancer in the family to her knowledge.  GYNECOLOGIC HISTORY:  Menarche age 89, first live birth age  39. The patient is GX P2. She is postmenopausal, but did have some perimenopausal bleeding, which was evaluated age 30 08/14/2012 with a transabdominal and transvaginal ultrasound of the pelvis which found a normal uterine myometrium and left ovary. The right ovary could not be visualized.  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 She's currently out of work on disability due to her breast cancer and chemotherapy. Her husband [name] also works for the [employer] system. Their children are [name], [place], 5. Kamyiah's mother currently lives in the home as well. The patient attends a [employer].   ADVANCED DIRECTIVES: Not in place   HEALTH MAINTENANCE: (Updated 09/03/2013) History  Substance Use Topics  . Smoking status: Never Smoker   . Smokeless tobacco: Never Used  . Alcohol Use: 1.2 oz/week    2 Glasses of wine per week     Comment: occassion     Colonoscopy:- Never  PAP: 2013, Dr. [name]  Bone density: Never  Lipid panel: Dr. [name]    No Known Allergies  Current Outpatient Prescriptions  Medication Sig Dispense Refill  . AMBULATORY NON FORMULARY MEDICATION Medication Name: Wig  (Medical necessity) Dx:174.4  1 Units  0  . ciprofloxacin (CIPRO) 500 MG tablet Take 1 tablet (500 mg total) by mouth 2 (two) times daily.  14 tablet  0  . hydrochlorothiazide (HYDRODIURIL) 25 MG tablet Take 25 mg by mouth daily after lunch.      Marland Kitchen HYDROcodone-acetaminophen (NORCO/VICODIN) 5-325 MG per tablet TAKE 1 OR 2 TABLETS BY MOUTH EVERY 6 HOURS AS NEEDED FOR PAIN  30 tablet  0  . levothyroxine (SYNTHROID, LEVOTHROID) 88 MCG tablet Take 88 mcg by mouth daily before breakfast.      . lidocaine-prilocaine (EMLA) cream Apply topically as needed. 1-2 hrs before each procedure  30 g  2  . ondansetron (ZOFRAN) 8 MG tablet Take 1 tablet (8 mg total) by mouth 2 (two) times daily as needed for nausea or vomiting.  20 tablet  1  . prochlorperazine (COMPAZINE) 10 MG tablet Take one tablet 4 times a day (ac and hs)starting the evening of chemotherapy, and continue an additional 3 days  30 tablet  2  . tobramycin-dexamethasone (TOBRADEX) ophthalmic solution Place 1 drop into both eyes 2 (two) times daily.  5 mL  0   No  current facility-administered medications for this visit.   Facility-Administered Medications Ordered in Other Visits  Medication Dose Route Frequency Provider Last Rate Last Dose  . CARBOplatin (PARAPLATIN) 280 mg in sodium chloride 0.9 % 100 mL chemo infusion  280 mg Intravenous Once Anglia Blakley G Miaa Latterell, PA-C      . famotidine (PEPCID) IVPB 20 mg  20 mg Intravenous Once Jakhi Dishman G Neesha Langton, PA-C   20 mg at 09/10/13 1055  . heparin lock flush 100 unit/mL  500 Units Intracatheter Once PRN Rachelle Edwards Milda Smart, PA-C      . PACLitaxel (TAXOL) 132 mg in sodium chloride 0.9 % 250 mL chemo infusion (</= 80mg /m2)  80 mg/m2 (Treatment Plan Actual) Intravenous Once Edithe Dobbin G Yaneli Keithley, PA-C      . sodium chloride 0.9 % injection 10 mL  10 mL Intracatheter PRN Khyler Urda Milda Smart, PA-C        OBJECTIVE:  young [place] American female who is examined in the infusion room today while receiving her chemotherapy treatment. She appears comfortable and is in no acute distress.  VITALS:  Blood pressure: 126/86, left arm sitting Pulse: 104 Respirations: 18 Temp: 97.8 Weight: 135 pounds 14.4 ounces (last checked on 09/03/2013) Height: 5 feet 2 inches (last measured 09/03/2013) BMI: 24.9 ECOG: 1  Physical Exam: EXTREMITIES:  No peripheral edema.   SKIN:  Hyperpigmentation noted on the hands and nailbeds bilaterally. There is no drainage around the nails, and no evidence of infection. Port is intact in the right upper chest wall with no erythema and no evidence of infection/cellulitis.  NEURO:  Nonfocal. Well oriented.   Appropriate affect.  Remainder of physical exam was deferred today.     LAB RESULTS:  Lab Results  Component Value Date   WBC 2.8* 09/10/2013   NEUTROABS 0.9* 09/10/2013   HGB 11.0* 09/10/2013   HCT 32.4* 09/10/2013   MCV 96.4 09/10/2013   PLT 265 09/10/2013      Chemistry      Component Value Date/Time   NA 143 09/03/2013 1015   NA 136 04/11/2013 0927   K 3.7 09/03/2013 1015   K 3.9 04/11/2013 0927   CL 103 04/11/2013  0927   CO2 25 09/03/2013 1015   CO2 29 04/11/2013 0927   BUN 8.9 09/03/2013 1015   BUN 10 04/11/2013 0927   CREATININE 0.6 09/03/2013 1015   CREATININE 0.67 04/11/2013 0927   CREATININE 0.65 05/21/2009 2243      Component Value Date/Time   CALCIUM 9.6 09/03/2013 1015   CALCIUM 9.5 04/11/2013 0927   ALKPHOS 71 09/03/2013 1015   ALKPHOS 70 01/19/2013 0910   AST 23 09/03/2013 1015   AST 21 01/19/2013 0910   ALT 23 09/03/2013 1015   ALT 20 01/19/2013 0910   BILITOT 0.27 09/03/2013 1015   BILITOT 0.4 01/19/2013 0910        STUDIES: Mr Breast Bilateral W Wo Contrast 06/15/2013   CLINICAL DATA:  Biopsy-proven upper-outer quadrant left breast cancer. Question response to neoadjuvant chemotherapy.  EXAM: MR BILATERAL BREAST WITHOUT AND WITH CONTRAST  LABS:  Not applicable  TECHNIQUE: Multiplanar, multisequence MR images of both breasts were obtained prior to and following the intravenous administration of 12ml of MultiHance.  THREE-DIMENSIONAL MR IMAGE RENDERING ON INDEPENDENT WORKSTATION:  Three-dimensional MR images were rendered by post-processing of the original MR data on an independent workstation. The three-dimensional MR images were interpreted, and findings are reported in the following complete MRI report for this study.  COMPARISON:  Previous exams  FINDINGS: Breast composition: c:  Heterogeneous fibroglandular tissue  Background parenchymal enhancement: Moderate  Right breast: No mass or abnormal enhancement.  Left breast: Interval decrease in size of irregular enhancing mass in the posterior upper outer left breast. The mass measures 1.7 x 0.9 x 1.4 cm compared with 2.5 x 1.9 x 2 cm previously. No additional areas of abnormal enhancement.  Lymph nodes: No abnormal appearing lymph nodes.  Ancillary findings:  None.  IMPRESSION: Interval positive response to therapy, with biopsy proven left breast carcinoma measuring 1.7 x 0.9 x 1.4 cm compared with 2.5 x 1.9 x 2 cm previously.  RECOMMENDATION: Treatment  planning.  BI-RADS CATEGORY  6: Known biopsy-proven malignancy - appropriate action should be taken.   Electronically Signed   By: Donavan Burnet M.D.   On: 06/15/2013 13:42     ASSESSMENT: 48 y.o.  woman   (1)  status post left breast upper outer quadrant biopsy 04/06/2013 for a clinical T2 N1, stage IIB invasive ductal carcinoma, high-grade, triple negative, with an MIB-1 of 65%  (2)  being treated  in the neoadjuvant setting,   (a) completed 4 dose dense cycles of doxorubicin/ cyclophosphamide 06/11/2013,.  (b)  0n 06/25/2013 started 12 weekly cycles of carboplatin/ paclitaxel, to be followed by definitive surgery    PLAN: Our entire 15 minute appointment today was spent reviewing the patient's case, answering her questions, discussing our treatment plan, and coordinating care.   We are making no changes to Eddie Dibbles was regimen, and will follow the same exact plan as one week ago since her counts are all stable. She will receive her carboplatin/paclitaxel today as planned, her temp weekly dose. She will receive Neupogen for the next 3 days, 480 mcg each. (If there is snow and ice tomorrow and Samhita is unable to get into the office for her injection, she will receive her injections Wednesday, Thursday, and Friday. Otherwise, these will be given on Tuesday, Wednesday, and Thursday.)   Rhythm will monitor herself closely and will contact us with any elevated temperatures of 100 or above.  I will see her again in one week, February 23, for her 11th weekly dose of neoadjuvant carboplatin/paclitaxel.  If she continues to tolerate treatment well and we are able to proceed with all 12 planned weekly doses, her final chemotherapy will be on March 2. She'll be ready for surgery approximately 4 weeks later.    She is already scheduled for breast MRI just prior to that final chemotherapy on January 27, and we will keep her schedule as it stands. She will  see Dr. Zella Richer again in early March  to discuss her definitive surgery.  This plan was reviewed with [name] today, and she  voiced her understanding and agreement. She knows to call with any changes or problems prior to her next scheduled appointment.   Aveion Nguyen, PA-C   09/10/2013 11:08 AM

## 2013-09-10 NOTE — Patient Instructions (Signed)
[facility] Discharge Instructions for Patients Receiving Chemotherapy  Today you received the following chemotherapy agents TAXOL, CARBOPLATIN  To help prevent nausea and vomiting after your treatment, we encourage you to take your nausea medication IF NEEDED   If you develop nausea and vomiting that is not controlled by your nausea medication, call the clinic.   BELOW ARE SYMPTOMS THAT SHOULD BE REPORTED IMMEDIATELY:  *FEVER GREATER THAN 100.5 F  *CHILLS WITH OR WITHOUT FEVER  NAUSEA AND VOMITING THAT IS NOT CONTROLLED WITH YOUR NAUSEA MEDICATION  *UNUSUAL SHORTNESS OF BREATH  *UNUSUAL BRUISING OR BLEEDING  TENDERNESS IN MOUTH AND THROAT WITH OR WITHOUT PRESENCE OF ULCERS  *URINARY PROBLEMS  *BOWEL PROBLEMS  UNUSUAL RASH Items with * indicate a potential emergency and should be followed up as soon as possible.  Feel free to call the clinic you have any questions or concerns. The clinic phone number is [phone].    

## 2013-09-11 ENCOUNTER — Telehealth: Payer: Self-pay | Admitting: *Deleted

## 2013-09-11 ENCOUNTER — Ambulatory Visit: Payer: BC Managed Care – PPO

## 2013-09-11 NOTE — Telephone Encounter (Signed)
Have transfer the patient to [facility] to reschedule her appt

## 2013-09-11 NOTE — Telephone Encounter (Signed)
Per patient request and Val ok to move appts to earlier in the day

## 2013-09-11 NOTE — Telephone Encounter (Signed)
Pt called and spoke with [name] in Med Rec stating that she was unable to make her genetic appt on 2/23 due to schedule change for chemo.  [name] called me to make me aware.  I called pt and rescheduled her genetic appt for 10/08/13.

## 2013-09-12 ENCOUNTER — Telehealth (INDEPENDENT_AMBULATORY_CARE_PROVIDER_SITE_OTHER): Payer: Self-pay

## 2013-09-12 ENCOUNTER — Ambulatory Visit (HOSPITAL_BASED_OUTPATIENT_CLINIC_OR_DEPARTMENT_OTHER): Payer: BC Managed Care – PPO

## 2013-09-12 ENCOUNTER — Ambulatory Visit: Payer: BC Managed Care – PPO

## 2013-09-12 VITALS — BP 114/72 | HR 103 | Temp 98.7°F

## 2013-09-12 DIAGNOSIS — C50419 Malignant neoplasm of upper-outer quadrant of unspecified female breast: Secondary | ICD-10-CM

## 2013-09-12 DIAGNOSIS — D709 Neutropenia, unspecified: Secondary | ICD-10-CM

## 2013-09-12 DIAGNOSIS — C50412 Malignant neoplasm of upper-outer quadrant of left female breast: Secondary | ICD-10-CM

## 2013-09-12 DIAGNOSIS — Z5189 Encounter for other specified aftercare: Secondary | ICD-10-CM

## 2013-09-12 MED ORDER — TBO-FILGRASTIM 480 MCG/0.8ML ~~LOC~~ SOSY
480.0000 ug | PREFILLED_SYRINGE | Freq: Once | SUBCUTANEOUS | Status: AC
  Administered 2013-09-12: 480 ug via SUBCUTANEOUS
  Filled 2013-09-12: qty 0.8

## 2013-09-12 NOTE — Telephone Encounter (Signed)
Spoke with the patient and she has decided to have bilateral mastectomies with reconstruction.  She was also informed that this surgery would be scheduled no sooner than 4 weeks after her last chemo treatment.  She is expecting a call from our surgery schedulers on 09/13/13.

## 2013-09-13 ENCOUNTER — Encounter: Payer: BC Managed Care – PPO | Admitting: Genetic Counselor

## 2013-09-13 ENCOUNTER — Other Ambulatory Visit: Payer: BC Managed Care – PPO

## 2013-09-13 ENCOUNTER — Ambulatory Visit (HOSPITAL_BASED_OUTPATIENT_CLINIC_OR_DEPARTMENT_OTHER): Payer: BC Managed Care – PPO

## 2013-09-13 ENCOUNTER — Ambulatory Visit: Payer: BC Managed Care – PPO

## 2013-09-13 VITALS — BP 120/80 | HR 115 | Temp 98.5°F

## 2013-09-13 DIAGNOSIS — D709 Neutropenia, unspecified: Secondary | ICD-10-CM

## 2013-09-13 DIAGNOSIS — C50419 Malignant neoplasm of upper-outer quadrant of unspecified female breast: Secondary | ICD-10-CM

## 2013-09-13 DIAGNOSIS — Z5189 Encounter for other specified aftercare: Secondary | ICD-10-CM

## 2013-09-13 DIAGNOSIS — C50412 Malignant neoplasm of upper-outer quadrant of left female breast: Secondary | ICD-10-CM

## 2013-09-13 MED ORDER — TBO-FILGRASTIM 480 MCG/0.8ML ~~LOC~~ SOSY
480.0000 ug | PREFILLED_SYRINGE | Freq: Once | SUBCUTANEOUS | Status: AC
  Administered 2013-09-13: 480 ug via SUBCUTANEOUS
  Filled 2013-09-13: qty 0.8

## 2013-09-14 ENCOUNTER — Ambulatory Visit (HOSPITAL_BASED_OUTPATIENT_CLINIC_OR_DEPARTMENT_OTHER): Payer: BC Managed Care – PPO

## 2013-09-14 ENCOUNTER — Other Ambulatory Visit: Payer: Self-pay | Admitting: Physician Assistant

## 2013-09-14 ENCOUNTER — Telehealth: Payer: Self-pay | Admitting: *Deleted

## 2013-09-14 VITALS — BP 136/81 | HR 116 | Temp 98.9°F

## 2013-09-14 DIAGNOSIS — Z5189 Encounter for other specified aftercare: Secondary | ICD-10-CM

## 2013-09-14 DIAGNOSIS — D709 Neutropenia, unspecified: Secondary | ICD-10-CM

## 2013-09-14 DIAGNOSIS — C50412 Malignant neoplasm of upper-outer quadrant of left female breast: Secondary | ICD-10-CM

## 2013-09-14 DIAGNOSIS — C50419 Malignant neoplasm of upper-outer quadrant of unspecified female breast: Secondary | ICD-10-CM

## 2013-09-14 MED ORDER — TBO-FILGRASTIM 480 MCG/0.8ML ~~LOC~~ SOSY
480.0000 ug | PREFILLED_SYRINGE | Freq: Once | SUBCUTANEOUS | Status: AC
Start: 2013-09-14 — End: 2013-09-14
  Administered 2013-09-14: 480 ug via SUBCUTANEOUS
  Filled 2013-09-14: qty 0.8

## 2013-09-14 NOTE — Telephone Encounter (Signed)
lvm for return call.Brittney Vance  

## 2013-09-17 ENCOUNTER — Ambulatory Visit (HOSPITAL_BASED_OUTPATIENT_CLINIC_OR_DEPARTMENT_OTHER): Payer: BC Managed Care – PPO | Admitting: Oncology

## 2013-09-17 ENCOUNTER — Encounter: Payer: BC Managed Care – PPO | Admitting: Genetic Counselor

## 2013-09-17 ENCOUNTER — Telehealth (INDEPENDENT_AMBULATORY_CARE_PROVIDER_SITE_OTHER): Payer: Self-pay | Admitting: *Deleted

## 2013-09-17 ENCOUNTER — Ambulatory Visit (HOSPITAL_BASED_OUTPATIENT_CLINIC_OR_DEPARTMENT_OTHER): Payer: BC Managed Care – PPO

## 2013-09-17 ENCOUNTER — Other Ambulatory Visit (HOSPITAL_BASED_OUTPATIENT_CLINIC_OR_DEPARTMENT_OTHER): Payer: BC Managed Care – PPO

## 2013-09-17 VITALS — BP 139/91 | HR 102 | Temp 98.4°F | Resp 18 | Ht 62.0 in | Wt 132.9 lb

## 2013-09-17 DIAGNOSIS — Z171 Estrogen receptor negative status [ER-]: Secondary | ICD-10-CM

## 2013-09-17 DIAGNOSIS — R432 Parageusia: Secondary | ICD-10-CM

## 2013-09-17 DIAGNOSIS — D6481 Anemia due to antineoplastic chemotherapy: Secondary | ICD-10-CM | POA: Insufficient documentation

## 2013-09-17 DIAGNOSIS — D709 Neutropenia, unspecified: Secondary | ICD-10-CM

## 2013-09-17 DIAGNOSIS — E039 Hypothyroidism, unspecified: Secondary | ICD-10-CM

## 2013-09-17 DIAGNOSIS — C50412 Malignant neoplasm of upper-outer quadrant of left female breast: Secondary | ICD-10-CM

## 2013-09-17 DIAGNOSIS — C50419 Malignant neoplasm of upper-outer quadrant of unspecified female breast: Secondary | ICD-10-CM

## 2013-09-17 DIAGNOSIS — Z5111 Encounter for antineoplastic chemotherapy: Secondary | ICD-10-CM

## 2013-09-17 DIAGNOSIS — I10 Essential (primary) hypertension: Secondary | ICD-10-CM

## 2013-09-17 DIAGNOSIS — D649 Anemia, unspecified: Secondary | ICD-10-CM

## 2013-09-17 DIAGNOSIS — T451X5A Adverse effect of antineoplastic and immunosuppressive drugs, initial encounter: Secondary | ICD-10-CM

## 2013-09-17 DIAGNOSIS — R232 Flushing: Secondary | ICD-10-CM

## 2013-09-17 DIAGNOSIS — R439 Unspecified disturbances of smell and taste: Secondary | ICD-10-CM

## 2013-09-17 DIAGNOSIS — N951 Menopausal and female climacteric states: Secondary | ICD-10-CM

## 2013-09-17 DIAGNOSIS — E876 Hypokalemia: Secondary | ICD-10-CM

## 2013-09-17 LAB — CBC WITH DIFFERENTIAL/PLATELET
BASO%: 1.7 % (ref 0.0–2.0)
Basophils Absolute: 0.1 10*3/uL (ref 0.0–0.1)
EOS%: 0.2 % (ref 0.0–7.0)
Eosinophils Absolute: 0 10*3/uL (ref 0.0–0.5)
HCT: 33.4 % — ABNORMAL LOW (ref 34.8–46.6)
HGB: 11.6 g/dL (ref 11.6–15.9)
LYMPH%: 28 % (ref 14.0–49.7)
MCH: 32.9 pg (ref 25.1–34.0)
MCHC: 34.7 g/dL (ref 31.5–36.0)
MCV: 94.6 fL (ref 79.5–101.0)
MONO#: 0.9 10*3/uL (ref 0.1–0.9)
MONO%: 21.1 % — ABNORMAL HIGH (ref 0.0–14.0)
NEUT#: 2 10*3/uL (ref 1.5–6.5)
NEUT%: 49 % (ref 38.4–76.8)
NRBC: 3 % — AB (ref 0–0)
Platelets: 291 10*3/uL (ref 145–400)
RBC: 3.53 10*6/uL — AB (ref 3.70–5.45)
RDW: 15.6 % — ABNORMAL HIGH (ref 11.2–14.5)
WBC: 4 10*3/uL (ref 3.9–10.3)
lymph#: 1.1 10*3/uL (ref 0.9–3.3)

## 2013-09-17 LAB — COMPREHENSIVE METABOLIC PANEL (CC13)
ALBUMIN: 4.2 g/dL (ref 3.5–5.0)
ALK PHOS: 95 U/L (ref 40–150)
ALT: 30 U/L (ref 0–55)
AST: 27 U/L (ref 5–34)
Anion Gap: 10 mEq/L (ref 3–11)
BUN: 9.3 mg/dL (ref 7.0–26.0)
CALCIUM: 10 mg/dL (ref 8.4–10.4)
CHLORIDE: 102 meq/L (ref 98–109)
CO2: 28 mEq/L (ref 22–29)
Creatinine: 0.7 mg/dL (ref 0.6–1.1)
Glucose: 92 mg/dl (ref 70–140)
POTASSIUM: 3 meq/L — AB (ref 3.5–5.1)
Sodium: 140 mEq/L (ref 136–145)
TOTAL PROTEIN: 7.2 g/dL (ref 6.4–8.3)
Total Bilirubin: 0.27 mg/dL (ref 0.20–1.20)

## 2013-09-17 MED ORDER — HEPARIN SOD (PORK) LOCK FLUSH 100 UNIT/ML IV SOLN
500.0000 [IU] | Freq: Once | INTRAVENOUS | Status: AC | PRN
  Administered 2013-09-17: 500 [IU]
  Filled 2013-09-17: qty 5

## 2013-09-17 MED ORDER — DEXAMETHASONE SODIUM PHOSPHATE 20 MG/5ML IJ SOLN
20.0000 mg | Freq: Once | INTRAMUSCULAR | Status: AC
  Administered 2013-09-17: 20 mg via INTRAVENOUS

## 2013-09-17 MED ORDER — ONDANSETRON 16 MG/50ML IVPB (CHCC)
INTRAVENOUS | Status: AC
  Filled 2013-09-17: qty 16

## 2013-09-17 MED ORDER — FAMOTIDINE IN NACL 20-0.9 MG/50ML-% IV SOLN
20.0000 mg | Freq: Once | INTRAVENOUS | Status: AC
  Administered 2013-09-17: 20 mg via INTRAVENOUS

## 2013-09-17 MED ORDER — SODIUM CHLORIDE 0.9 % IV SOLN
80.0000 mg/m2 | Freq: Once | INTRAVENOUS | Status: AC
  Administered 2013-09-17: 132 mg via INTRAVENOUS
  Filled 2013-09-17: qty 22

## 2013-09-17 MED ORDER — SODIUM CHLORIDE 0.9 % IV SOLN
278.0000 mg | Freq: Once | INTRAVENOUS | Status: AC
  Administered 2013-09-17: 280 mg via INTRAVENOUS
  Filled 2013-09-17: qty 28

## 2013-09-17 MED ORDER — DEXAMETHASONE SODIUM PHOSPHATE 20 MG/5ML IJ SOLN
INTRAMUSCULAR | Status: AC
  Filled 2013-09-17: qty 5

## 2013-09-17 MED ORDER — SODIUM CHLORIDE 0.9 % IV SOLN
Freq: Once | INTRAVENOUS | Status: AC
  Administered 2013-09-17: 10:00:00 via INTRAVENOUS

## 2013-09-17 MED ORDER — DIPHENHYDRAMINE HCL 50 MG/ML IJ SOLN
INTRAMUSCULAR | Status: AC
  Filled 2013-09-17: qty 1

## 2013-09-17 MED ORDER — ONDANSETRON 16 MG/50ML IVPB (CHCC)
16.0000 mg | Freq: Once | INTRAVENOUS | Status: AC
  Administered 2013-09-17: 16 mg via INTRAVENOUS

## 2013-09-17 MED ORDER — DIPHENHYDRAMINE HCL 50 MG/ML IJ SOLN
50.0000 mg | Freq: Once | INTRAMUSCULAR | Status: AC
  Administered 2013-09-17: 50 mg via INTRAVENOUS

## 2013-09-17 MED ORDER — POTASSIUM CHLORIDE CRYS ER 20 MEQ PO TBCR: 20.0000 meq | EXTENDED_RELEASE_TABLET | Freq: Once | ORAL | Status: DC

## 2013-09-17 MED ORDER — FAMOTIDINE IN NACL 20-0.9 MG/50ML-% IV SOLN
INTRAVENOUS | Status: AC
  Filled 2013-09-17: qty 50

## 2013-09-17 MED ORDER — SODIUM CHLORIDE 0.9 % IJ SOLN
10.0000 mL | INTRAMUSCULAR | Status: DC | PRN
  Administered 2013-09-17: 10 mL
  Filled 2013-09-17: qty 10

## 2013-09-17 NOTE — Progress Notes (Signed)
ID: Brittney Vance OB: [birth year]  MR#: [id number]  [id number]  PCP: Beatrice Lecher, MD GYN:  Veneda Melter SU: [name]; [name] OTHER MD: [name]  CHIEF COMPLAINT:  Left Breast Cancer, neoadjuvant chemotherapy  HISTORY OF PRESENT ILLNESS: Brittney Vance had routine screening mammography 03/09/2013 showing heterogeneously dense breasts, and a possible asymmetry in the left breast. Diagnostic left mammography and ultrasonography at the breast Center 03/30/2013 found a lobulated mass in the left breast upper outer quadrant measuring 1.6 cm. This was palpable. Ultrasound showed a complex microlobulated hypoechoic mass measuring 1.8 cm. There was no left axillary lymphadenopathy noted.  Biopsy of the mass in question 04/06/2013 showed ([id number]) and invasive ductal carcinoma, grade 3, triple negative, with an MIB-1 of 65%.  Bilateral breast MRI 04/13/2013 found an irregular mass measuring 2.5 cm in the left breast with a few lymph nodes that showed moderate cortical thickness, the largest measuring 12 mm (level I).  The patient's subsequent history is as detailed below  INTERVAL HISTORY: Brittney Vance returns today for followup of her locally advanced left breast cancer. Today is day 1 cycle 11 of 12 planned weekly carboplatin/paclitaxel doses.     REVIEW OF SYSTEMS: Brittney Vance is doing remarkably well as far as the chemotherapy is concerned. Specifically she denies any significant peripheral neuropathy. Sometimes but she feels a little funny feeling" in her fingertips, but this is not constant, and right now this moment she has normal sensation in her fingers and toes, with no numbness or tingling at all. Her counts have been on the low side and we are supplementing with Neulasta for 3 days after each chemotherapy dose. She has had no fever. She is tolerating prophylactic Cipro without any problems. She does have taste alteration, and the only things have taste but are salty or garlic he.  She feels tired and is not sleeping well at night. Sometimes she has a little bit of back pain, which is fleeting, and which has been present on and off since she had her epidurals. She is having some hot flashes. Otherwise, a detailed review of systems today was noncontributory.   PAST MEDICAL HISTORY: Past Medical History  Diagnosis Date  . Fibroids   . Hypertension   . Hypothyroid   . Breast cancer     PAST SURGICAL HISTORY: Past Surgical History  Procedure Laterality Date  . Tubal ligation    . Portacath placement Right 04/27/2013    Procedure: ULTRASOUND GUIDED PORT INSERTION WITH FLUOROSCOPY;  Surgeon: [name], MD;  Location: [facility];  Service: General;  Laterality: Right;    FAMILY HISTORY Family History  Problem Relation Age of Onset  . Hypertension Mother   . Diabetes Mother   . Alzheimer's disease Mother   . Hypertension Father   . Diabetes Father   . Hypertension Brother    the patient's father died in his [id number] from complications of diabetes. The patient's mother is living, in her mid 73s. The patient had 2 brothers, and 2 sisters. There is no history of cancer in the family to her knowledge.  GYNECOLOGIC HISTORY:  Menarche age 19, first live birth age 52. The patient is GX P2. She is postmenopausal, but did have some perimenopausal bleeding, which was evaluated age 11 08/14/2012 with a transabdominal and transvaginal ultrasound of the pelvis which found a normal uterine myometrium and left ovary. The right ovary could not be visualized.  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 She's currently out of work on disability due to her breast cancer and chemotherapy. Her husband Brittney Vance also works for the [employer] system. Their children are [name], [place], 5. Klaire's mother currently lives in the home as well. The patient attends a [employer].   ADVANCED DIRECTIVES: Not in  place   HEALTH MAINTENANCE: (Updated 09/03/2013) History  Substance Use Topics  . Smoking status: Never Smoker   . Smokeless tobacco: Never Used  . Alcohol Use: 1.2 oz/week    2 Glasses of wine per week     Comment: occassion     Colonoscopy:- Never  PAP: 2013, Dr. [name]  Bone density: Never  Lipid panel: Dr. [name]    No Known Allergies  Current Outpatient Prescriptions  Medication Sig Dispense Refill  . AMBULATORY NON FORMULARY MEDICATION Medication Name: Wig  (Medical necessity) Dx:174.4  1 Units  0  . ciprofloxacin (CIPRO) 500 MG tablet Take 1 tablet (500 mg total) by mouth 2 (two) times daily.  14 tablet  0  . hydrochlorothiazide (HYDRODIURIL) 25 MG tablet Take 25 mg by mouth daily after lunch.      Marland Kitchen HYDROcodone-acetaminophen (NORCO/VICODIN) 5-325 MG per tablet TAKE 1 OR 2 TABLETS BY MOUTH EVERY 6 HOURS AS NEEDED FOR PAIN  30 tablet  0  . levothyroxine (SYNTHROID, LEVOTHROID) 88 MCG tablet Take 88 mcg by mouth daily before breakfast.      . lidocaine-prilocaine (EMLA) cream Apply topically as needed. 1-2 hrs before each procedure  30 g  2  . ondansetron (ZOFRAN) 8 MG tablet Take 1 tablet (8 mg total) by mouth 2 (two) times daily as needed for nausea or vomiting.  20 tablet  1  . prochlorperazine (COMPAZINE) 10 MG tablet Take one tablet 4 times a day (ac and hs)starting the evening of chemotherapy, and continue an additional 3 days  30 tablet  2  . tobramycin-dexamethasone (TOBRADEX) ophthalmic solution Place 1 drop into both eyes 2 (two) times daily.  5 mL  0   No current facility-administered medications for this visit.    Middle-aged [place] American woman in no acute distress Filed Vitals:   09/17/13 0918  BP: 139/91  Pulse: 102  Temp: 98.4 F (36.9 C)  Resp: 18   Sclerae unicteric, pupils equal and round Oropharynx clear and slightly dry No cervical or supraclavicular adenopathy Lungs no rales or rhonchi Heart regular rate and rhythm Abd soft,  nontender, positive bowel sounds MSK no focal spinal tenderness, no upper extremity lymphedema Neuro: nonfocal, well oriented, appropriate affect Breasts: Deferred  '  LAB RESULTS:  Lab Results  Component Value Date   WBC 2.8* 09/10/2013   NEUTROABS 0.9* 09/10/2013   HGB 11.0* 09/10/2013   HCT 32.4* 09/10/2013   MCV 96.4 09/10/2013   PLT 265 09/10/2013      Chemistry      Component Value Date/Time   NA 143 09/03/2013 1015   NA 136 04/11/2013 0927   K 3.7 09/03/2013 1015   K 3.9 04/11/2013 0927   CL 103 04/11/2013 0927   CO2 25 09/03/2013 1015   CO2 29 04/11/2013 0927   BUN 8.9 09/03/2013 1015   BUN 10 04/11/2013 0927   CREATININE 0.6 09/03/2013 1015   CREATININE 0.67 04/11/2013 0927   CREATININE 0.65 05/21/2009 2243      Component Value Date/Time   CALCIUM 9.6 09/03/2013 1015   CALCIUM 9.5 04/11/2013 0927   ALKPHOS 71 09/03/2013 1015   ALKPHOS  70 01/19/2013 0910   AST 23 09/03/2013 1015   AST 21 01/19/2013 0910   ALT 23 09/03/2013 1015   ALT 20 01/19/2013 0910   BILITOT 0.27 09/03/2013 1015   BILITOT 0.4 01/19/2013 0910        STUDIES: No results found.  ASSESSMENT: 48 y.o. [place] woman   (1)  status post left breast upper outer quadrant biopsy 04/06/2013 for a clinical T2 N1, stage IIB invasive ductal carcinoma, high-grade, triple negative, with an MIB-1 of 65%  (2)  being treated in the neoadjuvant setting,   (a) completed 4 dose dense cycles of doxorubicin/ cyclophosphamide 06/11/2013,.  (b)  0n 06/25/2013 started 12 weekly cycles of carboplatin/ paclitaxel, to be followed by definitive surgery    PLAN: Eddie Dibbles will proceed today to her 11th of 12 planned cycles of weekly carboplatin and paclitaxel. Her ANC is 0.9, which it was at the last treatment as well. She has had no intercurrent fevers, or other evidence of infection. She is tolerating the prophylactic Cipro without any side effects.  She will have her repeat breast MRI on February 27. She will call afternoon for results. She  will see [place] a week from now for her final dose, and she is a ready scheduled for her surgery in early April.  I have encouraged her to increase her fluid intake and to do some walking, but actually overall she is doing terrific very close to completing her treatment without any major complications.  Chauncey Cruel, MD   09/17/2013 8:53 AM

## 2013-09-17 NOTE — Telephone Encounter (Signed)
Because of the way the scheduling system works, I need to wait to within 30 days of the operative date to put in orders. At that time the we'll be some lab work orders.

## 2013-09-17 NOTE — Telephone Encounter (Signed)
Patient called to ask about lab work prior to her surgery in April.  She is asking if she can have the orders to take with her to the cancer center since they do lab work on her there regularly.  I explained that I do not currently see any orders for labs but I will check with Dr. Zella Richer to see if he is requiring any then we will let her know.  Patient states understanding and agreeable at this time.

## 2013-09-17 NOTE — Telephone Encounter (Signed)
Pt was calling about rx for wig.Audelia Hives [place]

## 2013-09-17 NOTE — Patient Instructions (Signed)
[facility] Discharge Instructions for Patients Receiving Chemotherapy  Today you received the following chemotherapy agents: Taxol and Carboplatin.  To help prevent nausea and vomiting after your treatment, we encourage you to take your nausea medication as prescribed.   If you develop nausea and vomiting that is not controlled by your nausea medication, call the clinic.   BELOW ARE SYMPTOMS THAT SHOULD BE REPORTED IMMEDIATELY:  *FEVER GREATER THAN 100.5 F  *CHILLS WITH OR WITHOUT FEVER  NAUSEA AND VOMITING THAT IS NOT CONTROLLED WITH YOUR NAUSEA MEDICATION  *UNUSUAL SHORTNESS OF BREATH  *UNUSUAL BRUISING OR BLEEDING  TENDERNESS IN MOUTH AND THROAT WITH OR WITHOUT PRESENCE OF ULCERS  *URINARY PROBLEMS  *BOWEL PROBLEMS  UNUSUAL RASH Items with * indicate a potential emergency and should be followed up as soon as possible.  Feel free to call the clinic you have any questions or concerns. The clinic phone number is [phone].    

## 2013-09-17 NOTE — Progress Notes (Signed)
Hypokalemia noted on todays labs, per Dr [name] Hakim, orders for Klor-Con sent to pharmacy, patient notified.

## 2013-09-17 NOTE — Addendum Note (Signed)
Addended by: Lannette Donath E on: 09/17/2013 11:46 AM   Modules accepted: Orders

## 2013-09-18 ENCOUNTER — Other Ambulatory Visit: Payer: Self-pay | Admitting: Oncology

## 2013-09-18 ENCOUNTER — Telehealth: Payer: Self-pay | Admitting: *Deleted

## 2013-09-18 ENCOUNTER — Ambulatory Visit: Payer: BC Managed Care – PPO

## 2013-09-18 NOTE — Telephone Encounter (Signed)
sw pt she called to cancel her inj appt for today due to the weather. i will send an email to [facility] due to she wants to rs the inj...td

## 2013-09-18 NOTE — Telephone Encounter (Signed)
Patient called and needed her start date of chemo. Appts date given   JMW

## 2013-09-19 ENCOUNTER — Ambulatory Visit (HOSPITAL_BASED_OUTPATIENT_CLINIC_OR_DEPARTMENT_OTHER): Payer: BC Managed Care – PPO

## 2013-09-19 VITALS — BP 121/84 | HR 101 | Temp 98.2°F

## 2013-09-19 DIAGNOSIS — D709 Neutropenia, unspecified: Secondary | ICD-10-CM

## 2013-09-19 DIAGNOSIS — C50419 Malignant neoplasm of upper-outer quadrant of unspecified female breast: Secondary | ICD-10-CM

## 2013-09-19 DIAGNOSIS — Z5189 Encounter for other specified aftercare: Secondary | ICD-10-CM

## 2013-09-19 DIAGNOSIS — C50412 Malignant neoplasm of upper-outer quadrant of left female breast: Secondary | ICD-10-CM

## 2013-09-19 MED ORDER — TBO-FILGRASTIM 480 MCG/0.8ML ~~LOC~~ SOSY
480.0000 ug | PREFILLED_SYRINGE | Freq: Once | SUBCUTANEOUS | Status: AC
  Administered 2013-09-19: 480 ug via SUBCUTANEOUS
  Filled 2013-09-19: qty 0.8

## 2013-09-19 NOTE — Telephone Encounter (Signed)
Called patient to make her aware of the below message.  Patient asked that we print out the orders so that she can take them with her to the cancer center so they can draw them since they already draw regular labs on her.  I explained that we will try to get those mailed out to her as soon as the orders are placed within 30 days of her surgery.

## 2013-09-20 ENCOUNTER — Ambulatory Visit (HOSPITAL_BASED_OUTPATIENT_CLINIC_OR_DEPARTMENT_OTHER): Payer: BC Managed Care – PPO

## 2013-09-20 ENCOUNTER — Ambulatory Visit: Payer: BC Managed Care – PPO

## 2013-09-20 ENCOUNTER — Telehealth: Payer: Self-pay | Admitting: *Deleted

## 2013-09-20 VITALS — BP 121/75 | HR 97 | Temp 98.4°F

## 2013-09-20 DIAGNOSIS — C50419 Malignant neoplasm of upper-outer quadrant of unspecified female breast: Secondary | ICD-10-CM

## 2013-09-20 DIAGNOSIS — D709 Neutropenia, unspecified: Secondary | ICD-10-CM

## 2013-09-20 DIAGNOSIS — Z5189 Encounter for other specified aftercare: Secondary | ICD-10-CM

## 2013-09-20 DIAGNOSIS — C50412 Malignant neoplasm of upper-outer quadrant of left female breast: Secondary | ICD-10-CM

## 2013-09-20 MED ORDER — TBO-FILGRASTIM 480 MCG/0.8ML ~~LOC~~ SOSY
480.0000 ug | PREFILLED_SYRINGE | Freq: Once | SUBCUTANEOUS | Status: AC
  Administered 2013-09-20: 480 ug via SUBCUTANEOUS
  Filled 2013-09-20: qty 0.8

## 2013-09-20 NOTE — Telephone Encounter (Signed)
   Provider input needed: Dizziness, possible neuropathy   Reason for call: new sign and symptoms reported  Neurological: positive for dizziness and neuropathy to hands and feet.   Patient last received chemotherapy/ treatment on 09-17-2013 received C11 of taxol/carbo  Patient was last seen in the office on 09-17-2013  Next appt is 09-24-2013  Is patient having fevers greater than 100.5?  no   Is patient having uncontrolled pain, or new pain? yes, "but I don't know what neuropathy feels like.  I just know my hands feel funny."  Admits to feeling like they've fallen asleep.   Is patient having new back pain that changes with position (worsens or eases when laying down?)  no   Is patient able to eat and drink? yes    Is patient able to pass stool without difficulty?   yes     Is patient having uncontrolled nausea?  no    patient calls 09/20/2013 with complaint of  Neurological: positive for dizziness and nailbeds continue darkened, fingers "feel funny", Denies trouble using hands to button, dress or use hands.Denies visual changes, has not checked b/p at home but today at [facility] b/p wnl = 121/75 and afebrile. Walking without problems or difficulty..   Summary Based on the above information advised patient to get up slowly to get her bearings before standing.  Drink at least 64 oz water and fluids daily to prevent dehydration and will call her at [phone] with any provider instructions.   Winston-Spruiell, Blandon Offerdahl  09/20/2013, 3:31 PM   Background Info  Kareli Hossain Katzenberger   DOB: [birth year]   MR#: [id number]   CSN#   [id number] 09/20/2013

## 2013-09-21 ENCOUNTER — Ambulatory Visit (HOSPITAL_BASED_OUTPATIENT_CLINIC_OR_DEPARTMENT_OTHER): Payer: BC Managed Care – PPO

## 2013-09-21 ENCOUNTER — Ambulatory Visit (HOSPITAL_COMMUNITY): Payer: BC Managed Care – PPO

## 2013-09-21 ENCOUNTER — Ambulatory Visit (HOSPITAL_COMMUNITY)
Admission: RE | Admit: 2013-09-21 | Discharge: 2013-09-21 | Disposition: A | Discharge status: Home / Self Care | Payer: BC Managed Care – PPO | Source: Ambulatory Visit | Attending: Physician Assistant | Admitting: Physician Assistant

## 2013-09-21 VITALS — BP 107/72 | HR 111 | Temp 99.0°F

## 2013-09-21 DIAGNOSIS — C50412 Malignant neoplasm of upper-outer quadrant of left female breast: Secondary | ICD-10-CM

## 2013-09-21 DIAGNOSIS — D709 Neutropenia, unspecified: Secondary | ICD-10-CM

## 2013-09-21 DIAGNOSIS — Z5189 Encounter for other specified aftercare: Secondary | ICD-10-CM

## 2013-09-21 DIAGNOSIS — C50419 Malignant neoplasm of upper-outer quadrant of unspecified female breast: Secondary | ICD-10-CM | POA: Insufficient documentation

## 2013-09-21 DIAGNOSIS — Z9221 Personal history of antineoplastic chemotherapy: Secondary | ICD-10-CM | POA: Insufficient documentation

## 2013-09-21 MED ORDER — GADOBENATE DIMEGLUMINE 529 MG/ML IV SOLN
14.0000 mL | Freq: Once | INTRAVENOUS | Status: AC | PRN
  Administered 2013-09-21: 14 mL via INTRAVENOUS

## 2013-09-21 MED ORDER — TBO-FILGRASTIM 480 MCG/0.8ML ~~LOC~~ SOSY
480.0000 ug | PREFILLED_SYRINGE | Freq: Once | SUBCUTANEOUS | Status: AC
  Administered 2013-09-21: 480 ug via SUBCUTANEOUS
  Filled 2013-09-21: qty 0.8

## 2013-09-24 ENCOUNTER — Telehealth: Payer: Self-pay | Admitting: *Deleted

## 2013-09-24 ENCOUNTER — Other Ambulatory Visit (HOSPITAL_BASED_OUTPATIENT_CLINIC_OR_DEPARTMENT_OTHER): Payer: BC Managed Care – PPO

## 2013-09-24 ENCOUNTER — Ambulatory Visit (HOSPITAL_BASED_OUTPATIENT_CLINIC_OR_DEPARTMENT_OTHER): Payer: BC Managed Care – PPO | Admitting: Oncology

## 2013-09-24 ENCOUNTER — Other Ambulatory Visit: Payer: Self-pay | Admitting: Oncology

## 2013-09-24 ENCOUNTER — Ambulatory Visit (HOSPITAL_BASED_OUTPATIENT_CLINIC_OR_DEPARTMENT_OTHER): Payer: BC Managed Care – PPO

## 2013-09-24 VITALS — BP 132/89 | HR 110 | Temp 98.4°F | Resp 18 | Ht 62.0 in | Wt 133.3 lb

## 2013-09-24 DIAGNOSIS — C50419 Malignant neoplasm of upper-outer quadrant of unspecified female breast: Secondary | ICD-10-CM

## 2013-09-24 DIAGNOSIS — R432 Parageusia: Secondary | ICD-10-CM

## 2013-09-24 DIAGNOSIS — C50412 Malignant neoplasm of upper-outer quadrant of left female breast: Secondary | ICD-10-CM

## 2013-09-24 DIAGNOSIS — I10 Essential (primary) hypertension: Secondary | ICD-10-CM

## 2013-09-24 DIAGNOSIS — T451X5A Adverse effect of antineoplastic and immunosuppressive drugs, initial encounter: Secondary | ICD-10-CM

## 2013-09-24 DIAGNOSIS — Z5111 Encounter for antineoplastic chemotherapy: Secondary | ICD-10-CM

## 2013-09-24 DIAGNOSIS — N951 Menopausal and female climacteric states: Secondary | ICD-10-CM

## 2013-09-24 DIAGNOSIS — D6481 Anemia due to antineoplastic chemotherapy: Secondary | ICD-10-CM

## 2013-09-24 DIAGNOSIS — C50919 Malignant neoplasm of unspecified site of unspecified female breast: Secondary | ICD-10-CM

## 2013-09-24 DIAGNOSIS — D649 Anemia, unspecified: Secondary | ICD-10-CM

## 2013-09-24 DIAGNOSIS — G609 Hereditary and idiopathic neuropathy, unspecified: Secondary | ICD-10-CM

## 2013-09-24 LAB — CBC WITH DIFFERENTIAL/PLATELET
BASO%: 1.6 % (ref 0.0–2.0)
BASOS ABS: 0.1 10*3/uL (ref 0.0–0.1)
EOS%: 0.3 % (ref 0.0–7.0)
Eosinophils Absolute: 0 10*3/uL (ref 0.0–0.5)
HEMATOCRIT: 31.6 % — AB (ref 34.8–46.6)
HGB: 10.7 g/dL — ABNORMAL LOW (ref 11.6–15.9)
LYMPH%: 23.7 % (ref 14.0–49.7)
MCH: 32.9 pg (ref 25.1–34.0)
MCHC: 33.9 g/dL (ref 31.5–36.0)
MCV: 97.2 fL (ref 79.5–101.0)
MONO#: 1.1 10*3/uL — ABNORMAL HIGH (ref 0.1–0.9)
MONO%: 18.7 % — AB (ref 0.0–14.0)
NEUT#: 3.2 10*3/uL (ref 1.5–6.5)
NEUT%: 55.7 % (ref 38.4–76.8)
Platelets: 283 10*3/uL (ref 145–400)
RBC: 3.25 10*6/uL — ABNORMAL LOW (ref 3.70–5.45)
RDW: 16.5 % — ABNORMAL HIGH (ref 11.2–14.5)
WBC: 5.8 10*3/uL (ref 3.9–10.3)
lymph#: 1.4 10*3/uL (ref 0.9–3.3)
nRBC: 2 % — ABNORMAL HIGH (ref 0–0)

## 2013-09-24 MED ORDER — SODIUM CHLORIDE 0.9 % IV SOLN
Freq: Once | INTRAVENOUS | Status: AC
  Administered 2013-09-24: 10:00:00 via INTRAVENOUS

## 2013-09-24 MED ORDER — DIPHENHYDRAMINE HCL 50 MG/ML IJ SOLN
INTRAMUSCULAR | Status: AC
  Filled 2013-09-24: qty 1

## 2013-09-24 MED ORDER — SODIUM CHLORIDE 0.9 % IV SOLN
80.0000 mg/m2 | Freq: Once | INTRAVENOUS | Status: AC
  Administered 2013-09-24: 132 mg via INTRAVENOUS
  Filled 2013-09-24: qty 22

## 2013-09-24 MED ORDER — SODIUM CHLORIDE 0.9 % IV SOLN
240.0000 mg | Freq: Once | INTRAVENOUS | Status: AC
  Administered 2013-09-24: 240 mg via INTRAVENOUS
  Filled 2013-09-24: qty 24

## 2013-09-24 MED ORDER — DEXAMETHASONE SODIUM PHOSPHATE 20 MG/5ML IJ SOLN
20.0000 mg | Freq: Once | INTRAMUSCULAR | Status: AC
  Administered 2013-09-24: 20 mg via INTRAVENOUS

## 2013-09-24 MED ORDER — SODIUM CHLORIDE 0.9 % IJ SOLN
10.0000 mL | INTRAMUSCULAR | Status: DC | PRN
  Administered 2013-09-24: 10 mL
  Filled 2013-09-24: qty 10

## 2013-09-24 MED ORDER — FAMOTIDINE IN NACL 20-0.9 MG/50ML-% IV SOLN
20.0000 mg | Freq: Once | INTRAVENOUS | Status: AC
  Administered 2013-09-24: 20 mg via INTRAVENOUS

## 2013-09-24 MED ORDER — ONDANSETRON 16 MG/50ML IVPB (CHCC)
INTRAVENOUS | Status: AC
  Filled 2013-09-24: qty 16

## 2013-09-24 MED ORDER — ONDANSETRON 16 MG/50ML IVPB (CHCC)
16.0000 mg | Freq: Once | INTRAVENOUS | Status: AC
  Administered 2013-09-24: 16 mg via INTRAVENOUS

## 2013-09-24 MED ORDER — DEXAMETHASONE SODIUM PHOSPHATE 20 MG/5ML IJ SOLN
INTRAMUSCULAR | Status: AC
  Filled 2013-09-24: qty 5

## 2013-09-24 MED ORDER — DIPHENHYDRAMINE HCL 50 MG/ML IJ SOLN
50.0000 mg | Freq: Once | INTRAMUSCULAR | Status: AC
Start: 2013-09-24 — End: 2013-09-24
  Administered 2013-09-24: 50 mg via INTRAVENOUS

## 2013-09-24 MED ORDER — HEPARIN SOD (PORK) LOCK FLUSH 100 UNIT/ML IV SOLN
500.0000 [IU] | Freq: Once | INTRAVENOUS | Status: AC | PRN
Start: 2013-09-24 — End: 2013-09-24
  Administered 2013-09-24: 500 [IU]
  Filled 2013-09-24: qty 5

## 2013-09-24 MED ORDER — FAMOTIDINE IN NACL 20-0.9 MG/50ML-% IV SOLN
INTRAVENOUS | Status: AC
  Filled 2013-09-24: qty 50

## 2013-09-24 NOTE — Patient Instructions (Signed)
[facility] Discharge Instructions for Patients Receiving Chemotherapy  Today you received the following chemotherapy agents: Taxol and Carboplatin.  To help prevent nausea and vomiting after your treatment, we encourage you to take your nausea medication as prescribed.   If you develop nausea and vomiting that is not controlled by your nausea medication, call the clinic.   BELOW ARE SYMPTOMS THAT SHOULD BE REPORTED IMMEDIATELY:  *FEVER GREATER THAN 100.5 F  *CHILLS WITH OR WITHOUT FEVER  NAUSEA AND VOMITING THAT IS NOT CONTROLLED WITH YOUR NAUSEA MEDICATION  *UNUSUAL SHORTNESS OF BREATH  *UNUSUAL BRUISING OR BLEEDING  TENDERNESS IN MOUTH AND THROAT WITH OR WITHOUT PRESENCE OF ULCERS  *URINARY PROBLEMS  *BOWEL PROBLEMS  UNUSUAL RASH Items with * indicate a potential emergency and should be followed up as soon as possible.  Feel free to call the clinic you have any questions or concerns. The clinic phone number is [phone].    

## 2013-09-24 NOTE — Telephone Encounter (Signed)
appts made and printed. Pt is aware that she will be called with an appt for Dr. Pablo Ledger once her referral is entered. i emailed GCM asking him to enter the order...td

## 2013-09-24 NOTE — Progress Notes (Signed)
ID: [name] OB: [birth year]  MR#: [id number]  [id number]  PCP: Beatrice Lecher, MD GYN:  Veneda Melter SU: [name]; [name] OTHER MD: [name]  CHIEF COMPLAINT:  Left Breast Cancer, neoadjuvant chemotherapy  HISTORY OF PRESENT ILLNESS: Brittney Vance had routine screening mammography 03/09/2013 showing heterogeneously dense breasts, and a possible asymmetry in the left breast. Diagnostic left mammography and ultrasonography at the breast Center 03/30/2013 found a lobulated mass in the left breast upper outer quadrant measuring 1.6 cm. This was palpable. Ultrasound showed a complex microlobulated hypoechoic mass measuring 1.8 cm. There was no left axillary lymphadenopathy noted.  Biopsy of the mass in question 04/06/2013 showed ([id number]) and invasive ductal carcinoma, grade 3, triple negative, with an MIB-1 of 65%.  Bilateral breast MRI 04/13/2013 found an irregular mass measuring 2.5 cm in the left breast with a few lymph nodes that showed moderate cortical thickness, the largest measuring 12 mm (level I).  The patient's subsequent history is as detailed below  INTERVAL HISTORY: Brittney Vance returns today for followup of her locally advanced left breast cancer. Today is day 1 cycle 12 of 12 planned weekly carboplatin /paclitaxel doses.     REVIEW OF SYSTEMS: Brittney Vance has tolerated her treatments remarkably well. When asked about neuropathy she tells me she has had a little bit of numbness over her fingertips, not toe tips, for several weeks. She just didn't realize that was what we were asking her about. She has had no tingling. It is just an altered feeling when she touches something. This clearly does not affect her activities of daily living. She has discovered that she is now allergic to the gloves she is using to wash dishes. This is a new a shoe for her. She sleeps poorly and remains fatigued. That was the main problem with her treatments. A second problem was difficulty  focusing. Of course she lost not only her head hair but also her eyebrows and eyelashes. She had no problems with nausea or vomiting in her port functioned well. She does experience hot flashes. They are not terribly troublesome. Otherwise a detailed review of systems today was noncontributory  PAST MEDICAL HISTORY: Past Medical History  Diagnosis Date  . Fibroids   . Hypertension   . Hypothyroid   . Breast cancer     PAST SURGICAL HISTORY: Past Surgical History  Procedure Laterality Date  . Tubal ligation    . Portacath placement Right 04/27/2013    Procedure: ULTRASOUND GUIDED PORT INSERTION WITH FLUOROSCOPY;  Surgeon: [name], MD;  Location: [facility];  Service: General;  Laterality: Right;    FAMILY HISTORY Family History  Problem Relation Age of Onset  . Hypertension Mother   . Diabetes Mother   . Alzheimer's disease Mother   . Hypertension Father   . Diabetes Father   . Hypertension Brother    the patient's father died in his [id number] from complications of diabetes. The patient's mother is living, in her mid 26s. The patient had 2 brothers, and 2 sisters. There is no history of cancer in the family to her knowledge.  GYNECOLOGIC HISTORY:  Menarche age 42, first live birth age 48. The patient is GX P2. She is postmenopausal, but did have some perimenopausal bleeding, which was evaluated age 24 08/14/2012 with a transabdominal and transvaginal ultrasound of the pelvis which found a normal uterine myometrium and left ovary. The right ovary could not be visualized.  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 She's currently out of work on disability due to her breast cancer and chemotherapy. Her husband [name] also works for the [employer] system. Their children are [name], [name], 5. Brittney Vance's mother currently lives in the home as well. The patient attends a [employer].   ADVANCED DIRECTIVES:  Not in place   HEALTH MAINTENANCE: (Updated 09/03/2013) History  Substance Use Topics  . Smoking status: Never Smoker   . Smokeless tobacco: Never Used  . Alcohol Use: 1.2 oz/week    2 Glasses of wine per week     Comment: occassion     Colonoscopy:- Never  PAP: 2013, Dr. [name]  Bone density: Never  Lipid panel: Dr. [name]    No Known Allergies  Current Outpatient Prescriptions  Medication Sig Dispense Refill  . AMBULATORY NON FORMULARY MEDICATION Medication Name: Wig  (Medical necessity) Dx:174.4  1 Units  0  . ciprofloxacin (CIPRO) 500 MG tablet Take 1 tablet (500 mg total) by mouth 2 (two) times daily.  14 tablet  0  . hydrochlorothiazide (HYDRODIURIL) 25 MG tablet Take 25 mg by mouth daily after lunch.      Marland Kitchen HYDROcodone-acetaminophen (NORCO/VICODIN) 5-325 MG per tablet TAKE 1 OR 2 TABLETS BY MOUTH EVERY 6 HOURS AS NEEDED FOR PAIN  30 tablet  0  . levothyroxine (SYNTHROID, LEVOTHROID) 88 MCG tablet Take 88 mcg by mouth daily before breakfast.      . lidocaine-prilocaine (EMLA) cream Apply topically as needed. 1-2 hrs before each procedure  30 g  2  . ondansetron (ZOFRAN) 8 MG tablet Take 1 tablet (8 mg total) by mouth 2 (two) times daily as needed for nausea or vomiting.  20 tablet  1  . potassium chloride SA (K-DUR,KLOR-CON) 20 MEQ tablet Take 1 tablet (20 mEq total) by mouth once.  30 tablet  0  . prochlorperazine (COMPAZINE) 10 MG tablet Take one tablet 4 times a day (ac and hs)starting the evening of chemotherapy, and continue an additional 3 days  30 tablet  2  . tobramycin-dexamethasone (TOBRADEX) ophthalmic solution Place 1 drop into both eyes 2 (two) times daily.  5 mL  0   No current facility-administered medications for this visit.    Middle-aged [place] American woman who appears stated age 48 Vitals:   09/24/13 0910  BP: 132/89  Pulse: 110  Temp: 98.4 F (36.9 C)  Resp: 18   Sclerae unicteric, extraocular movements intact Oropharynx clear and  moist No cervical or supraclavicular adenopathy Lungs no rales or rhonchi Heart regular rate and rhythm Abd soft, nontender, positive bowel sounds MSK no focal spinal tenderness, no upper extremity lymphedema Neuro: nonfocal, well oriented, positive affect Breasts: I do not palpate a well-defined mass in the right breast. The right axilla is benign. The left breast is unremarkable.  '  LAB RESULTS:  Lab Results  Component Value Date   WBC 5.8 09/24/2013   NEUTROABS 3.2 09/24/2013   HGB 10.7* 09/24/2013   HCT 31.6* 09/24/2013   MCV 97.2 09/24/2013   PLT 283 09/24/2013      Chemistry      Component Value Date/Time   NA 140 09/17/2013 0902   NA 136 04/11/2013 0927   K 3.0* 09/17/2013 0902   K 3.9 04/11/2013 0927   CL 103 04/11/2013 0927   CO2 28 09/17/2013 0902   CO2 29 04/11/2013 0927   BUN 9.3 09/17/2013 0902   BUN 10 04/11/2013 0927   CREATININE  0.7 09/17/2013 0902   CREATININE 0.67 04/11/2013 0927   CREATININE 0.65 05/21/2009 2243      Component Value Date/Time   CALCIUM 10.0 09/17/2013 0902   CALCIUM 9.5 04/11/2013 0927   ALKPHOS 95 09/17/2013 0902   ALKPHOS 70 01/19/2013 0910   AST 27 09/17/2013 0902   AST 21 01/19/2013 0910   ALT 30 09/17/2013 0902   ALT 20 01/19/2013 0910   BILITOT 0.27 09/17/2013 0902   BILITOT 0.4 01/19/2013 0910        STUDIES: Mr Breast Bilateral W Wo Contrast  09/21/2013   CLINICAL DATA:  Biopsy proven left upper outer quadrant breast cancer in September, 2014, for which the patient has undergone neoadjuvant chemotherapy. Evaluate treatment response.  EXAM: BILATERAL BREAST MRI WITH AND WITHOUT CONTRAST  LABS:  Not obtained.  TECHNIQUE: Multiplanar, multisequence MR images of both breasts were obtained prior to and following the intravenous administration of 12 ml of MultiHance.  THREE-DIMENSIONAL MR IMAGE RENDERING ON INDEPENDENT WORKSTATION:  Three-dimensional MR images were rendered by post-processing of the original MR data on an independent workstation. The  three-dimensional MR images were interpreted, and findings are reported in the following complete MRI report for this study. Three dimensional images were evaluated at the independent DynaCad workstation.  COMPARISON:  Bilateral breast MRI 06/15/2013, 04/13/2013. Mammography 04/06/2013, 03/30/2013, 03/09/2013, dating back to 05/26/2009. Left breast ultrasound 04/24/2013, 04/06/2013, 03/30/2013.  FINDINGS: Breast composition: c.  Heterogeneous fibroglandular tissue.  Background parenchymal enhancement: Mild and symmetric.  Right breast: No mass or abnormal enhancement. Port-A-Cath port in the subcutaneous tissues of the right chest wall of the far upper-inner right breast.  Left breast: Near-complete resolution of the mass in the upper-outer quadrant of the left breast. Minimal residual enhancement measures 0.9 x 0.2 x 0.3 cm (previously 1.7 x 0.9 x 1.4 cm on 06/15/2013 and 2.5 x 1.9 x 2.0 cm on 04/13/2013). No new or suspicious findings elsewhere.  Lymph nodes: No abnormal appearing lymph nodes.  Ancillary findings:  None.  IMPRESSION: 1. Near-complete response to neoadjuvant chemotherapy. There is minimal residual enhancement at the site of the mass in the upper-outer quadrant of the left breast which measures 0.9 x 0.2 x 0.3 cm. 2. No new or suspicious findings elsewhere in either breast.  RECOMMENDATION: Treatment plan.  BI-RADS CATEGORY  6: Known biopsy-proven malignancy - appropriate action should be taken.   Electronically Signed   By: Evangeline Dakin M.D.   On: 09/21/2013 15:22    ASSESSMENT: 48 y.o.  woman   (1)  status post left breast upper outer quadrant biopsy 04/06/2013 for a clinical T2 N1, stage IIB invasive ductal carcinoma, high-grade, triple negative, with an MIB-1 of 65%  (2) left axillary lymph node biopsy 04/24/2013 negative for malignancy  (3)  treated in the neoadjuvant setting:   (a) completed 4 dose dense cycles of doxorubicin/ cyclophosphamide 06/11/2013,.  (b)   completed 12 weekly cycles of carboplatin/ paclitaxel 09/24/2013, to be followed by definitive surgery    PLAN: Daania will receive her 12th and final dose of paclitaxel/carboplatin today. This completes her chemotherapy. She will receive Neupogen for the next 3 days and see [place] in a week just to make sure she's not having any unusual symptoms. She really has an appointment with Dr. Zella Richer with planned bilateral mastectomies and left sentinel lymph node sampling early April.  Today we discussed her MRI, which is very favorable, although it does not show a complete radiologic response. We also discussed radiation, and assuming  her lymph nodes are negative she will probably not need postmastectomy radiation, but I am making her an appointment with Dr. Pablo Ledger just in case for some time late April to make that decision. She will see me around the same time to set up [facility] for  long-term followup   She has grade 1 neuropathy, which hopefully will resolve over time. 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  Karstyn is eager to get rid of her port. I have let her surgeon no this is fine after today.  Chauncey Cruel, MD   09/24/2013 9:17 AM

## 2013-09-25 ENCOUNTER — Ambulatory Visit (HOSPITAL_BASED_OUTPATIENT_CLINIC_OR_DEPARTMENT_OTHER): Payer: BC Managed Care – PPO

## 2013-09-25 VITALS — BP 131/83 | HR 97 | Temp 98.6°F

## 2013-09-25 DIAGNOSIS — C50412 Malignant neoplasm of upper-outer quadrant of left female breast: Secondary | ICD-10-CM

## 2013-09-25 DIAGNOSIS — Z5189 Encounter for other specified aftercare: Secondary | ICD-10-CM

## 2013-09-25 DIAGNOSIS — D709 Neutropenia, unspecified: Secondary | ICD-10-CM

## 2013-09-25 DIAGNOSIS — C50419 Malignant neoplasm of upper-outer quadrant of unspecified female breast: Secondary | ICD-10-CM

## 2013-09-25 MED ORDER — TBO-FILGRASTIM 480 MCG/0.8ML ~~LOC~~ SOSY
480.0000 ug | PREFILLED_SYRINGE | Freq: Once | SUBCUTANEOUS | Status: AC
  Administered 2013-09-25: 480 ug via SUBCUTANEOUS
  Filled 2013-09-25: qty 0.8

## 2013-09-25 NOTE — Patient Instructions (Signed)
Tbo-Filgrastim injection What is this medicine? TBO-FILGRASTIM is used to help decrease the time you have low amounts of white blood cells after cancer treatment. It helps the body make more white blood cells. Increasing the amount of white blood cells helps to decrease the risk of infection and fever. This medicine may be used for other purposes; ask your health care provider or pharmacist if you have questions. COMMON BRAND NAME(S): Granix What should I tell my health care provider before I take this medicine? They need to know if you have any of these conditions: -history of blood diseases, like sickle cell anemia or leukemia -an unusual or allergic reaction to tbo-filgrastim, filgrastim, pegfilgrastim, other medicines, foods, dyes, or preservatives -pregnant or trying to get pregnant -breast-feeding How should I use this medicine? This medicine is for injection under the skin. It is given by a health care professional in a hospital or clinic setting. Talk to your pediatrician regarding the use of this medicine in children. Special care may be needed. Overdosage: If you think you've taken too much of this medicine contact a poison control center or emergency room at once. Overdosage: If you think you have taken too much of this medicine contact a poison control center or emergency room at once. NOTE: This medicine is only for you. Do not share this medicine with others. What if I miss a dose? Keep appointments for follow-up doses as directed. It is important not to miss your dose. Call your doctor or health care professional if you are unable to keep an appointment. What may interact with this medicine? -lithium This list may not describe all possible interactions. Give your health care provider a list of all the medicines, herbs, non-prescription drugs, or dietary supplements you use. Also tell them if you smoke, drink alcohol, or use illegal drugs. Some items may interact with your  medicine. What should I watch for while using this medicine? Your condition will be monitored carefully while you are receiving this medicine. You may need blood work done while you are taking this medicine. Avoid taking products that contain aspirin, acetaminophen, ibuprofen, naproxen, or ketoprofen unless instructed by your doctor. These medicines may hide a fever. Call your doctor or health care professional for advice if you get a fever, chills or sore throat, or other symptoms of a cold or flu. Do not treat yourself. What side effects may I notice from receiving this medicine? Side effects that you should report to your doctor or health care professional as soon as possible: -allergic reactions like skin rash, itching or hives, swelling of the face, lips, or tongue -breathing problems -fever -stomach pain  Side effects that usually do not require medical attention (Report these to your doctor or health care professional if they continue or are bothersome.): -bone pain This list may not describe all possible side effects. Call your doctor for medical advice about side effects. You may report side effects to FDA at 1-800-FDA-1088. Where should I keep my medicine? This drug is given in a hospital or clinic and will not be stored at home. NOTE: This sheet is a summary. It may not cover all possible information. If you have questions about this medicine, talk to your doctor, pharmacist, or health care provider.  2014, Elsevier/Gold Standard. (2011-03-31 16:41:24)  

## 2013-09-26 ENCOUNTER — Encounter (INDEPENDENT_AMBULATORY_CARE_PROVIDER_SITE_OTHER): Payer: Self-pay | Admitting: General Surgery

## 2013-09-26 ENCOUNTER — Telehealth (INDEPENDENT_AMBULATORY_CARE_PROVIDER_SITE_OTHER): Payer: Self-pay | Admitting: General Surgery

## 2013-09-26 ENCOUNTER — Ambulatory Visit (HOSPITAL_BASED_OUTPATIENT_CLINIC_OR_DEPARTMENT_OTHER): Payer: BC Managed Care – PPO

## 2013-09-26 ENCOUNTER — Ambulatory Visit (INDEPENDENT_AMBULATORY_CARE_PROVIDER_SITE_OTHER): Payer: BC Managed Care – PPO | Admitting: General Surgery

## 2013-09-26 VITALS — BP 120/74 | HR 89 | Temp 98.9°F

## 2013-09-26 VITALS — BP 118/74 | HR 112 | Temp 98.7°F | Resp 15 | Ht 62.0 in | Wt 133.4 lb

## 2013-09-26 DIAGNOSIS — C50419 Malignant neoplasm of upper-outer quadrant of unspecified female breast: Secondary | ICD-10-CM

## 2013-09-26 DIAGNOSIS — D709 Neutropenia, unspecified: Secondary | ICD-10-CM

## 2013-09-26 DIAGNOSIS — C50919 Malignant neoplasm of unspecified site of unspecified female breast: Secondary | ICD-10-CM

## 2013-09-26 DIAGNOSIS — C50412 Malignant neoplasm of upper-outer quadrant of left female breast: Secondary | ICD-10-CM

## 2013-09-26 DIAGNOSIS — Z5189 Encounter for other specified aftercare: Secondary | ICD-10-CM

## 2013-09-26 MED ORDER — TBO-FILGRASTIM 480 MCG/0.8ML ~~LOC~~ SOSY
480.0000 ug | PREFILLED_SYRINGE | Freq: Once | SUBCUTANEOUS | Status: AC
  Administered 2013-09-26: 480 ug via SUBCUTANEOUS
  Filled 2013-09-26: qty 0.8

## 2013-09-26 NOTE — Patient Instructions (Signed)
Please call if you have any questions about the surgery.

## 2013-09-26 NOTE — Progress Notes (Addendum)
Patient ID: Kryssa Risenhoover Gainor, female   DOB: 26-May-1966, 48 y.o.   MRN: [id number]  Chief Complaint  Patient presents with  . Routine Post Op    5 wk br f/u     HPI Abbygael Curtiss Stanard is a 48 y.o. female.   HPI  She is here for a preoperative visit. She has completed her chemotherapy. Dr. [name] Hakim has said it is okay to remove the Port-A-Cath. Her surgery is scheduled for early April.  Past Medical History  Diagnosis Date  . Fibroids   . Hypertension   . Hypothyroid   . Breast cancer     Past Surgical History  Procedure Laterality Date  . Tubal ligation    . Portacath placement Right 04/27/2013    Procedure: ULTRASOUND GUIDED PORT INSERTION WITH FLUOROSCOPY;  Surgeon: [name], MD;  Location: [facility];  Service: General;  Laterality: Right;    Family History  Problem Relation Age of Onset  . Hypertension Mother   . Diabetes Mother   . Alzheimer's disease Mother   . Hypertension Father   . Diabetes Father   . Hypertension Brother     Social History History  Substance Use Topics  . Smoking status: Never Smoker   . Smokeless tobacco: Never Used  . Alcohol Use: 1.2 oz/week    2 Glasses of wine per week     Comment: occassion    No Known Allergies  Current Outpatient Prescriptions  Medication Sig Dispense Refill  . AMBULATORY NON FORMULARY MEDICATION Medication Name: Wig  (Medical necessity) Dx:174.4  1 Units  0  . ciprofloxacin (CIPRO) 500 MG tablet Take 1 tablet (500 mg total) by mouth 2 (two) times daily.  14 tablet  0  . hydrochlorothiazide (HYDRODIURIL) 25 MG tablet Take 25 mg by mouth daily after lunch.      Marland Kitchen HYDROcodone-acetaminophen (NORCO/VICODIN) 5-325 MG per tablet TAKE 1 OR 2 TABLETS BY MOUTH EVERY 6 HOURS AS NEEDED FOR PAIN  30 tablet  0  . levothyroxine (SYNTHROID, LEVOTHROID) 88 MCG tablet Take 88 mcg by mouth daily before breakfast.      . lidocaine-prilocaine (EMLA) cream Apply topically as needed. 1-2 hrs before each procedure  30 g  2  . ondansetron  (ZOFRAN) 8 MG tablet Take 1 tablet (8 mg total) by mouth 2 (two) times daily as needed for nausea or vomiting.  20 tablet  1  . potassium chloride SA (K-DUR,KLOR-CON) 20 MEQ tablet Take 1 tablet (20 mEq total) by mouth once.  30 tablet  0  . prochlorperazine (COMPAZINE) 10 MG tablet Take one tablet 4 times a day (ac and hs)starting the evening of chemotherapy, and continue an additional 3 days  30 tablet  2  . tobramycin-dexamethasone (TOBRADEX) ophthalmic solution Place 1 drop into both eyes 2 (two) times daily.  5 mL  0   No current facility-administered medications for this visit.    Review of Systems Review of Systems  Constitutional: Negative.   Respiratory: Negative.   Cardiovascular: Negative.     Blood pressure 118/74, pulse 112, temperature 98.7 F (37.1 C), temperature source Oral, resp. rate 15, height 5\' 2"  (1.575 m), weight 133 lb 6.4 oz (60.51 kg), last menstrual period 03/10/2013.  Physical Exam Physical Exam  Constitutional: She appears well-developed and well-nourished. No distress.  HENT:  Head: Normocephalic and atraumatic.  Cardiovascular: Normal rate and regular rhythm.   Pulmonary/Chest: Effort normal and breath sounds normal.  No palpable breast masses.  Palpable Port-A-Cath in  the right upper chest.  Abdominal: Soft. There is no tenderness.  Musculoskeletal:  No supraclavicular or axillary adenopathy.  Neurological: She is alert.  Skin: Skin is warm and dry.  Psychiatric: She has a normal mood and affect. Her behavior is normal.    Data Reviewed Previous notes.  Assessment    Invasive left breast cancer. She has completed neoadjuvant chemotherapy.     Plan    Bilateral mastectomies, left axillary sentinel lymph node biopsy, Port-A-Cath removal, reconstruction per Dr. Harlow Mares. The procedure and the risks have been discussed with her previously.        Kaytelyn Glore J 09/26/2013, 12:11 PM

## 2013-09-26 NOTE — Telephone Encounter (Signed)
Per Dr Zella Richer OV 09/26/13 10:40 am patient does not have to sign insurance authorization waiver for right side breast surgery 10/30/13

## 2013-09-26 NOTE — Telephone Encounter (Signed)
Called to scheduled nuc med injection - orders state LEFT axillary sentinel lymph node biopsy  However OV notes from 09/26/13 stated right axillary sentinel lymph node biopsy  nuc med needs clarification as to which side

## 2013-09-27 ENCOUNTER — Ambulatory Visit (HOSPITAL_BASED_OUTPATIENT_CLINIC_OR_DEPARTMENT_OTHER): Payer: BC Managed Care – PPO

## 2013-09-27 ENCOUNTER — Other Ambulatory Visit (INDEPENDENT_AMBULATORY_CARE_PROVIDER_SITE_OTHER): Payer: Self-pay | Admitting: General Surgery

## 2013-09-27 VITALS — BP 127/83 | HR 102 | Temp 98.8°F

## 2013-09-27 DIAGNOSIS — C50412 Malignant neoplasm of upper-outer quadrant of left female breast: Secondary | ICD-10-CM

## 2013-09-27 DIAGNOSIS — D709 Neutropenia, unspecified: Secondary | ICD-10-CM

## 2013-09-27 DIAGNOSIS — C50419 Malignant neoplasm of upper-outer quadrant of unspecified female breast: Secondary | ICD-10-CM

## 2013-09-27 DIAGNOSIS — Z5189 Encounter for other specified aftercare: Secondary | ICD-10-CM

## 2013-09-27 MED ORDER — TBO-FILGRASTIM 480 MCG/0.8ML ~~LOC~~ SOSY
480.0000 ug | PREFILLED_SYRINGE | Freq: Once | SUBCUTANEOUS | Status: AC
  Administered 2013-09-27: 480 ug via SUBCUTANEOUS
  Filled 2013-09-27: qty 0.8

## 2013-09-27 NOTE — Telephone Encounter (Signed)
Agreed -

## 2013-09-27 NOTE — Telephone Encounter (Signed)
Corrected

## 2013-09-28 ENCOUNTER — Other Ambulatory Visit (INDEPENDENT_AMBULATORY_CARE_PROVIDER_SITE_OTHER): Payer: Self-pay | Admitting: General Surgery

## 2013-09-28 DIAGNOSIS — C50919 Malignant neoplasm of unspecified site of unspecified female breast: Secondary | ICD-10-CM

## 2013-10-03 ENCOUNTER — Other Ambulatory Visit: Payer: Self-pay | Admitting: *Deleted

## 2013-10-03 ENCOUNTER — Encounter: Payer: Self-pay | Admitting: Physician Assistant

## 2013-10-03 ENCOUNTER — Ambulatory Visit (HOSPITAL_BASED_OUTPATIENT_CLINIC_OR_DEPARTMENT_OTHER): Payer: BC Managed Care – PPO | Admitting: Physician Assistant

## 2013-10-03 ENCOUNTER — Other Ambulatory Visit (HOSPITAL_BASED_OUTPATIENT_CLINIC_OR_DEPARTMENT_OTHER): Payer: BC Managed Care – PPO

## 2013-10-03 VITALS — BP 156/88 | HR 105 | Temp 98.7°F | Resp 20 | Ht 62.0 in | Wt 134.0 lb

## 2013-10-03 DIAGNOSIS — C50419 Malignant neoplasm of upper-outer quadrant of unspecified female breast: Secondary | ICD-10-CM

## 2013-10-03 DIAGNOSIS — G609 Hereditary and idiopathic neuropathy, unspecified: Secondary | ICD-10-CM

## 2013-10-03 DIAGNOSIS — D709 Neutropenia, unspecified: Secondary | ICD-10-CM

## 2013-10-03 DIAGNOSIS — C50412 Malignant neoplasm of upper-outer quadrant of left female breast: Secondary | ICD-10-CM

## 2013-10-03 DIAGNOSIS — E876 Hypokalemia: Secondary | ICD-10-CM

## 2013-10-03 DIAGNOSIS — D6481 Anemia due to antineoplastic chemotherapy: Secondary | ICD-10-CM

## 2013-10-03 DIAGNOSIS — T451X5A Adverse effect of antineoplastic and immunosuppressive drugs, initial encounter: Secondary | ICD-10-CM

## 2013-10-03 LAB — CBC WITH DIFFERENTIAL/PLATELET
BASO%: 0.9 % (ref 0.0–2.0)
Basophils Absolute: 0 10*3/uL (ref 0.0–0.1)
EOS%: 0.6 % (ref 0.0–7.0)
Eosinophils Absolute: 0 10*3/uL (ref 0.0–0.5)
HCT: 30.3 % — ABNORMAL LOW (ref 34.8–46.6)
HGB: 10.5 g/dL — ABNORMAL LOW (ref 11.6–15.9)
LYMPH%: 31.4 % (ref 14.0–49.7)
MCH: 34.1 pg — AB (ref 25.1–34.0)
MCHC: 34.7 g/dL (ref 31.5–36.0)
MCV: 98.3 fL (ref 79.5–101.0)
MONO#: 0.6 10*3/uL (ref 0.1–0.9)
MONO%: 24.1 % — AB (ref 0.0–14.0)
NEUT#: 1.1 10*3/uL — ABNORMAL LOW (ref 1.5–6.5)
NEUT%: 43 % (ref 38.4–76.8)
PLATELETS: 197 10*3/uL (ref 145–400)
RBC: 3.08 10*6/uL — AB (ref 3.70–5.45)
RDW: 18.9 % — AB (ref 11.2–14.5)
WBC: 2.6 10*3/uL — ABNORMAL LOW (ref 3.9–10.3)
lymph#: 0.8 10*3/uL — ABNORMAL LOW (ref 0.9–3.3)

## 2013-10-03 LAB — COMPREHENSIVE METABOLIC PANEL (CC13)
ALK PHOS: 96 U/L (ref 40–150)
ALT: 49 U/L (ref 0–55)
AST: 37 U/L — AB (ref 5–34)
Albumin: 4 g/dL (ref 3.5–5.0)
Anion Gap: 11 mEq/L (ref 3–11)
BUN: 6.9 mg/dL — ABNORMAL LOW (ref 7.0–26.0)
CO2: 26 mEq/L (ref 22–29)
Calcium: 9.7 mg/dL (ref 8.4–10.4)
Chloride: 105 mEq/L (ref 98–109)
Creatinine: 0.6 mg/dL (ref 0.6–1.1)
Glucose: 95 mg/dl (ref 70–140)
Potassium: 3.4 mEq/L — ABNORMAL LOW (ref 3.5–5.1)
Sodium: 142 mEq/L (ref 136–145)
TOTAL PROTEIN: 6.9 g/dL (ref 6.4–8.3)
Total Bilirubin: 0.38 mg/dL (ref 0.20–1.20)

## 2013-10-03 NOTE — Progress Notes (Signed)
ID: Newell Coral Gammell OB: 1965-10-30  MR#: [id number]  TSV#:[id number]  PCP: Beatrice Lecher, MD GYN:  Veneda Melter SU: [name]; [name] OTHER MD: [name]  CHIEF COMPLAINT:  Left Breast Cancer, neoadjuvant chemotherapy  HISTORY OF PRESENT ILLNESS: Jerlyn had routine screening mammography 03/09/2013 showing heterogeneously dense breasts, and a possible asymmetry in the left breast. Diagnostic left mammography and ultrasonography at the breast Center 03/30/2013 found a lobulated mass in the left breast upper outer quadrant measuring 1.6 cm. This was palpable. Ultrasound showed a complex microlobulated hypoechoic mass measuring 1.8 cm. There was no left axillary lymphadenopathy noted.  Biopsy of the mass in question 04/06/2013 showed ([id number]) and invasive ductal carcinoma, grade 3, triple negative, with an MIB-1 of 65%.  Bilateral breast MRI 04/13/2013 found an irregular mass measuring 2.5 cm in the left breast with a few lymph nodes that showed moderate cortical thickness, the largest measuring 12 mm (level I).  The patient's subsequent history is as detailed below  INTERVAL HISTORY: Oda returns alone today for followup of her locally advanced left breast cancer. She has completed all of her neoadjuvant chemotherapy, her final dose of weekly carboplatin/paclitaxel given last week on 09/24/2013. She is already scheduled for surgery under the care of Dr.  Zella Richer and is anticipating bilateral mastectomies, scheduled for 10/30/2013.  Meganne is still very tired, but she is very glad that she has completed her chemotherapy. She tells me she is just "ready to have all of this behind her". She still has some residual peripheral neuropathy which is stable, affecting both her fingertips and her toes. Fortunately, and is not affecting any of her day-to-day activities or fine motor skills. She describes it as feeling "uncomfortable".    REVIEW OF SYSTEMS: Anetra has had no recent  illnesses and denies any fevers. She does have some hot flashes and night sweats. She's had no new skin changes and denies any abnormal bruising or bleeding. Her appetite is fair. She's had no nausea or change in bowel or bladder habits. She currently denies any cough, increased shortness of breath, chest pain, or palpitations. She's had no peripheral swelling. She denies any abnormal headaches or dizziness and has had no change in vision. She also denies any unusual myalgias, arthralgias, or bony pain at the current time.  Detailed review of systems is otherwise stable and noncontributory.   PAST MEDICAL HISTORY: Past Medical History  Diagnosis Date  . Fibroids   . Hypertension   . Hypothyroid   . Breast cancer     PAST SURGICAL HISTORY: Past Surgical History  Procedure Laterality Date  . Tubal ligation    . Portacath placement Right 04/27/2013    Procedure: ULTRASOUND GUIDED PORT INSERTION WITH FLUOROSCOPY;  Surgeon: [name], MD;  Location: [facility];  Service: General;  Laterality: Right;    FAMILY HISTORY Family History  Problem Relation Age of Onset  . Hypertension Mother   . Diabetes Mother   . Alzheimer's disease Mother   . Hypertension Father   . Diabetes Father   . Hypertension Brother    the patient's father died in his 92Z from complications of diabetes. The patient's mother is living, in her mid 80s. The patient had 2 brothers, and 2 sisters. There is no history of cancer in the family to her knowledge.  GYNECOLOGIC HISTORY:    (updated 10/03/2013)  Menarche age 23, first live birth age 8. The patient is GX P2. She is postmenopausal, but did have some perimenopausal bleeding,  which was evaluated age 53 08/14/2012 with a transabdominal and transvaginal ultrasound of the pelvis which found a normal uterine myometrium and left ovary. The right ovary could not be visualized.  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 She's currently out of work on disability due to her breast cancer and chemotherapy. Her husband [name] also works for the [employer] system. Their children are [name], [place], 5. Peyson's mother currently lives in the home as well. The patient attends a [employer].   ADVANCED DIRECTIVES: Not in place   HEALTH MAINTENANCE: (Updated 10/03/2013) History  Substance Use Topics  . Smoking status: Never Smoker   . Smokeless tobacco: Never Used  . Alcohol Use: 1.2 oz/week    2 Glasses of wine per week     Comment: occassion     Colonoscopy:- Never  PAP: 2013, Dr. [name]  Bone density: Never  Lipid panel: Dr. [name]    No Known Allergies  Current Outpatient Prescriptions  Medication Sig Dispense Refill  . AMBULATORY NON FORMULARY MEDICATION Medication Name: Wig  (Medical necessity) Dx:174.4  1 Units  0  . hydrochlorothiazide (HYDRODIURIL) 25 MG tablet Take 25 mg by mouth daily after lunch.      . levothyroxine (SYNTHROID, LEVOTHROID) 88 MCG tablet Take 88 mcg by mouth daily before breakfast.      . lidocaine-prilocaine (EMLA) cream Apply topically as needed. 1-2 hrs before each procedure  30 g  2  . potassium chloride SA (K-DUR,KLOR-CON) 20 MEQ tablet Take 1 tablet (20 mEq total) by mouth once.  30 tablet  0  . HYDROcodone-acetaminophen (NORCO/VICODIN) 5-325 MG per tablet TAKE 1 OR 2 TABLETS BY MOUTH EVERY 6 HOURS AS NEEDED FOR PAIN  30 tablet  0  . tobramycin-dexamethasone (TOBRADEX) ophthalmic solution Place 1 drop into both eyes 2 (two) times daily.  5 mL  0   No current facility-administered medications for this visit.   PHYSICAL EXAM: Middle-aged [place] American woman who appears tired but is in no acute distress Filed Vitals:   10/03/13 1016  BP: 156/88  Pulse: 105  Temp: 98.7 F (37.1 C)  Resp: 20  Body mass index is 24.5 kg/(m^2).  ECOG: 1 Filed Weights   10/03/13 1016  Weight: 134 lb (60.782 kg)   Physical Exam: HEENT:   Sclerae anicteric.  Oropharynx clear and moist. No lacerations and no evidence of oropharyngeal candidiasis. Neck is supple. NODES:  No cervical or supraclavicular lymphadenopathy palpated.  BREAST EXAM:  Deferred. Axillae are benign bilaterally, no palpable lymphadenopathy. LUNGS:  Clear to auscultation bilaterally.  No wheezes or rhonchi HEART:  Regular rate and rhythm. No murmur  ABDOMEN:  Soft, nontender.  Positive bowel sounds.  MSK:  No focal spinal tenderness to palpation. Good range of motion in the upper extremities. EXTREMITIES:  No peripheral edema.   SKIN:  Hyperpigmentation noted on the skin of the hands bilaterally, along with hyperpigmentation of the nailbeds. No drainage or evidence of infection. No excessive ecchymoses or petechiae. Port is intact in the chest wall with no erythema, edema, or evidence of infection/cellulitis. NEURO:  Nonfocal. Well oriented.  Fatigued affect.     LAB RESULTS:  Lab Results  Component Value Date   WBC 2.6* 10/03/2013   NEUTROABS 1.1* 10/03/2013   HGB 10.5* 10/03/2013   HCT 30.3* 10/03/2013   MCV 98.3 10/03/2013   PLT 197 10/03/2013      Chemistry  Component Value Date/Time   NA 142 10/03/2013 0952   NA 136 04/11/2013 0927   K 3.4* 10/03/2013 0952   K 3.9 04/11/2013 0927   CL 103 04/11/2013 0927   CO2 26 10/03/2013 0952   CO2 29 04/11/2013 0927   BUN 6.9* 10/03/2013 0952   BUN 10 04/11/2013 0927   CREATININE 0.6 10/03/2013 0952   CREATININE 0.67 04/11/2013 0927   CREATININE 0.65 05/21/2009 2243      Component Value Date/Time   CALCIUM 9.7 10/03/2013 0952   CALCIUM 9.5 04/11/2013 0927   ALKPHOS 96 10/03/2013 0952   ALKPHOS 70 01/19/2013 0910   AST 37* 10/03/2013 0952   AST 21 01/19/2013 0910   ALT 49 10/03/2013 0952   ALT 20 01/19/2013 0910   BILITOT 0.38 10/03/2013 0952   BILITOT 0.4 01/19/2013 0910        STUDIES: Mr Breast Bilateral W Wo Contrast  09/21/2013   CLINICAL DATA:  Biopsy proven left upper outer quadrant breast cancer in  September, 2014, for which the patient has undergone neoadjuvant chemotherapy. Evaluate treatment response.  EXAM: BILATERAL BREAST MRI WITH AND WITHOUT CONTRAST  LABS:  Not obtained.  TECHNIQUE: Multiplanar, multisequence MR images of both breasts were obtained prior to and following the intravenous administration of 12 ml of MultiHance.  THREE-DIMENSIONAL MR IMAGE RENDERING ON INDEPENDENT WORKSTATION:  Three-dimensional MR images were rendered by post-processing of the original MR data on an independent workstation. The three-dimensional MR images were interpreted, and findings are reported in the following complete MRI report for this study. Three dimensional images were evaluated at the independent DynaCad workstation.  COMPARISON:  Bilateral breast MRI 06/15/2013, 04/13/2013. Mammography 04/06/2013, 03/30/2013, 03/09/2013, dating back to 05/26/2009. Left breast ultrasound 04/24/2013, 04/06/2013, 03/30/2013.  FINDINGS: Breast composition: c.  Heterogeneous fibroglandular tissue.  Background parenchymal enhancement: Mild and symmetric.  Right breast: No mass or abnormal enhancement. Port-A-Cath port in the subcutaneous tissues of the right chest wall of the far upper-inner right breast.  Left breast: Near-complete resolution of the mass in the upper-outer quadrant of the left breast. Minimal residual enhancement measures 0.9 x 0.2 x 0.3 cm (previously 1.7 x 0.9 x 1.4 cm on 06/15/2013 and 2.5 x 1.9 x 2.0 cm on 04/13/2013). No new or suspicious findings elsewhere.  Lymph nodes: No abnormal appearing lymph nodes.  Ancillary findings:  None.  IMPRESSION: 1. Near-complete response to neoadjuvant chemotherapy. There is minimal residual enhancement at the site of the mass in the upper-outer quadrant of the left breast which measures 0.9 x 0.2 x 0.3 cm. 2. No new or suspicious findings elsewhere in either breast.  RECOMMENDATION: Treatment plan.  BI-RADS CATEGORY  6: Known biopsy-proven malignancy - appropriate action  should be taken.   Electronically Signed   By: Evangeline Dakin M.D.   On: 09/21/2013 15:22    ASSESSMENT: 48 y.o. [place] woman   (1)  status post left breast upper outer quadrant biopsy 04/06/2013 for a clinical T2 N1, stage IIB invasive ductal carcinoma, high-grade, triple negative, with an MIB-1 of 65%  (2) left axillary lymph node biopsy 04/24/2013 negative for malignancy  (3)  treated in the neoadjuvant setting:   (a) completed 4 dose dense cycles of doxorubicin/ cyclophosphamide 06/11/2013,.  (b)  completed 12 weekly cycles of carboplatin/ paclitaxel 09/24/2013  (4)  Currently scheduled for definitive surgery on 10/30/2013, anticipating bilateral mastectomies  (5)  Genetic testing pending  (6)  hypokalemia, increasing potassium supplements 20 mEq twice daily     PLAN:  Kimeka has completed her neoadjuvant chemotherapy and is now ready to proceed with her definitive surgery, already scheduled for April 7. She's having her port removed at the same time. She has been scheduled to meet with Dr. Pablo Ledger in late April to discuss the possibility of postmastectomy radiation. She will then see Dr. [name] Hakim in early May to review her final pathology report and discuss her long-term followup.   Of note, she is also scheduled for genetic testing next week, 10/08/2013.  Aldyn voices her understanding and agreement with our plan as stated above. Of course as always, she knows to call any changes or problems prior to her next appointment.  Harper Smoker, PA-C   10/03/2013 11:30 AM

## 2013-10-08 ENCOUNTER — Ambulatory Visit: Payer: BC Managed Care – PPO | Admitting: Genetic Counselor

## 2013-10-08 ENCOUNTER — Ambulatory Visit (HOSPITAL_BASED_OUTPATIENT_CLINIC_OR_DEPARTMENT_OTHER): Payer: BC Managed Care – PPO

## 2013-10-08 ENCOUNTER — Encounter: Payer: Self-pay | Admitting: Genetic Counselor

## 2013-10-08 ENCOUNTER — Other Ambulatory Visit: Payer: BC Managed Care – PPO

## 2013-10-08 VITALS — BP 130/87 | HR 106 | Temp 98.6°F

## 2013-10-08 DIAGNOSIS — Z95828 Presence of other vascular implants and grafts: Secondary | ICD-10-CM

## 2013-10-08 DIAGNOSIS — C50419 Malignant neoplasm of upper-outer quadrant of unspecified female breast: Secondary | ICD-10-CM

## 2013-10-08 DIAGNOSIS — Z452 Encounter for adjustment and management of vascular access device: Secondary | ICD-10-CM

## 2013-10-08 DIAGNOSIS — C50412 Malignant neoplasm of upper-outer quadrant of left female breast: Secondary | ICD-10-CM

## 2013-10-08 MED ORDER — SODIUM CHLORIDE 0.9 % IJ SOLN
10.0000 mL | INTRAMUSCULAR | Status: DC | PRN
  Administered 2013-10-08: 10 mL via INTRAVENOUS
  Filled 2013-10-08: qty 10

## 2013-10-08 MED ORDER — HEPARIN SOD (PORK) LOCK FLUSH 100 UNIT/ML IV SOLN
500.0000 [IU] | Freq: Once | INTRAVENOUS | Status: AC
  Administered 2013-10-08: 500 [IU] via INTRAVENOUS
  Filled 2013-10-08: qty 5

## 2013-10-08 NOTE — Patient Instructions (Signed)

## 2013-10-08 NOTE — Progress Notes (Signed)
Dr.  Sarajane Jews Magrinat requested a consultation for genetic counseling and risk assessment for [facility], a 48 y.o. female, for discussion of her personal history of breast cancer.  She presents to clinic today to discuss the possibility of a genetic predisposition to cancer, and to further clarify her risks, as well as her family members' risks for cancer.   HISTORY OF PRESENT ILLNESS: In 2014, at the age of 89, Brittney Vance was diagnosed with invasive ductal carcinoma of the left breast. This was treated with chemotherapy. She is scheduled for a double mastectomy on October 30, 2013, and does not know if she is going to have radiation.       Past Medical History  Diagnosis Date  . Fibroids   . Hypertension   . Hypothyroid   . Breast cancer 2015    triple negative     Past Surgical History  Procedure Laterality Date  . Tubal ligation    . Portacath placement Right 04/27/2013    Procedure: ULTRASOUND GUIDED PORT INSERTION WITH FLUOROSCOPY;  Surgeon: [name], MD;  Location: [facility];  Service: General;  Laterality: Right;    History   Social History  . Marital Status: Married    Spouse Name: [name]     Number of Children: 2  . Years of Education: Masters    Occupational History  . media specialist      [employer]   Social History Main Topics  . Smoking status: Never Smoker   . Smokeless tobacco: Never Used  . Alcohol Use: 1.2 oz/week    2 Glasses of wine per week     Comment: occassion  . Drug Use: No  . Sexual Activity: Yes    Partners: Male    Birth Control/ Protection: Post-menopausal   Other Topics Concern  . None   Social History Narrative   1-2 caffeine drinks per day. No regular exercise.    REPRODUCTIVE HISTORY AND PERSONAL RISK ASSESSMENT FACTORS: Menarche was at age 37.   postmenopausal Uterus Intact: yes Ovaries Intact: yes G2P2A0, first live birth at age 40  She has not previously undergone treatment for infertility.   Oral Contraceptive use:  5-10 years   She has not used HRT in the past.    FAMILY HISTORY:  We obtained a detailed, 4-generation family history.  Significant diagnoses are listed below: Family History  Problem Relation Age of Onset  . Hypertension Mother   . Diabetes Mother   . Alzheimer's disease Mother   . Hypertension Father   . Diabetes Father   . Hypertension Brother   . Epilepsy Son 4    being worked up for autism  Father was adopted, so there is limited information on his side of the family.  Patient's maternal ancestors are of [place] and [place] descent, and paternal ancestors are of unknown descent. There is no reported Ashkenazi Jewish ancestry. There is no known consanguinity.  GENETIC COUNSELING ASSESSMENT: Brittney Vance is a 48 y.o. female with a personal history of triple negative breast cancer which somewhat suggestive of a hereditary cacner syndrome and predisposition to cancer. We, therefore, discussed and recommended the following at today's visit.   DISCUSSION: We reviewed the characteristics, features and inheritance patterns of hereditary cancer syndromes. We also discussed genetic testing, including the appropriate family members to test, the process of testing, insurance coverage and turn-around-time for results. We reviewed the common reasons why an individual would have triple negative breast cancer  under age 70, including BRCA1, BRCA2 and PALB2 mutations. The patient did not have questions about testing, and wanted to pursue genetic testing.  PLAN: After considering the risks, benefits, and limitations, Brittney Vance provided informed consent to pursue genetic testing and the blood sample will be sent to [facility] for analysis of the Breast/Ovarian Cancer Panel. We discussed the implications of a positive, negative and/ or variant of uncertain significance genetic test result. Results should be available within approximately 3 weeks' time, at which point they will be  disclosed by telephone to [facility], as will any additional recommendations warranted by these results. Brittney Vance will receive a summary of her genetic counseling visit and a copy of her results once available. This information will also be available in Epic. We encouraged Brittney Vance to remain in contact with cancer genetics annually so that we can continuously update the family history and inform her of any changes in cancer genetics and testing that may be of benefit for her family. Brittney Vance's questions were answered to her satisfaction today. Our contact information was provided should additional questions or concerns arise.  The patient was seen for a total of 45 minutes, greater than 50% of which was spent face-to-face counseling.  This note will also be sent to the referring provider via the electronic medical record. The patient will be supplied with a summary of this genetic counseling discussion as well as educational information on the discussed hereditary cancer syndromes following the conclusion of their visit.   Patient was discussed with Dr. Marcy Panning.   _______________________________________________________________________ For Office Staff:  Number of people involved in session: 1 Was an Intern/ student involved with case: yes

## 2013-10-15 ENCOUNTER — Encounter (HOSPITAL_COMMUNITY): Payer: Self-pay | Admitting: Pharmacy Technician

## 2013-10-19 NOTE — Pre-Procedure Instructions (Signed)
Brittney Vance  10/19/2013   Your procedure is scheduled on: Tuesday, April 7.  Report to [facility] Entrance/ Entrance "A" at 5:30 AM.  Call this number if you have problems the morning of surgery: [phone]   Remember:   Do not eat food or drink liquids after midnight Monday, April 6.    Take these medicines the morning of surgery with A SIP OF WATER: levothyroxine (SYNTHROID, LEVOTHROID.               Take if needed: HYDROcodone-acetaminophen (NORCO/VICODIN).     Do not wear jewelry, make-up or nail polish.  Do not wear lotions, powders, or perfumes.   Do not shave 48 hours prior to surgery.   Do not bring valuables to the hospital.  [facility] is not responsible for any belongings or valuables.               Contacts, dentures or bridgework may not be worn into surgery.  Leave suitcase in the car. After surgery it may be brought to your room.  For patients admitted to the hospital, discharge time is determined by your treatment team.               Patients discharged the day of surgery will not be allowed to drive home.  Name and phone number of your driver:    Special Instructions: -   Please read over the following fact sheets that you were given: Pain Booklet, Coughing and Deep Breathing, Blood Transfusion Information and Surgical Site Infection Prevention

## 2013-10-22 ENCOUNTER — Encounter (HOSPITAL_COMMUNITY): Payer: Self-pay

## 2013-10-22 ENCOUNTER — Encounter (HOSPITAL_COMMUNITY)
Admission: RE | Admit: 2013-10-22 | Discharge: 2013-10-22 | Disposition: A | Discharge status: Home / Self Care | Payer: BC Managed Care – PPO | Source: Ambulatory Visit | Attending: General Surgery | Admitting: General Surgery

## 2013-10-22 ENCOUNTER — Other Ambulatory Visit: Payer: Self-pay | Admitting: Plastic Surgery

## 2013-10-22 DIAGNOSIS — Z01812 Encounter for preprocedural laboratory examination: Secondary | ICD-10-CM | POA: Insufficient documentation

## 2013-10-22 LAB — COMPREHENSIVE METABOLIC PANEL
ALT: 30 U/L (ref 0–35)
AST: 30 U/L (ref 0–37)
Albumin: 3.8 g/dL (ref 3.5–5.2)
Alkaline Phosphatase: 81 U/L (ref 39–117)
BUN: 7 mg/dL (ref 6–23)
CO2: 27 mEq/L (ref 19–32)
Calcium: 9.9 mg/dL (ref 8.4–10.5)
Chloride: 102 mEq/L (ref 96–112)
Creatinine, Ser: 0.51 mg/dL (ref 0.50–1.10)
GFR calc Af Amer: >90 mL/min (ref >90)
GFR calc non Af Amer: >90 mL/min (ref >90)
Glucose, Bld: 110 mg/dL — ABNORMAL HIGH (ref 70–99)
Potassium: 4.1 mEq/L (ref 3.7–5.3)
Sodium: 143 mEq/L (ref 137–147)
Total Bilirubin: 0.3 mg/dL (ref 0.3–1.2)
Total Protein: 7 g/dL (ref 6.0–8.3)

## 2013-10-22 LAB — CBC WITH DIFFERENTIAL/PLATELET
BASOS ABS: 0 10*3/uL (ref 0.0–0.1)
Basophils Relative: 0 % (ref 0–1)
Eosinophils Absolute: 0.1 10*3/uL (ref 0.0–0.7)
Eosinophils Relative: 2 % (ref 0–5)
HCT: 32.7 % — ABNORMAL LOW (ref 36.0–46.0)
HEMOGLOBIN: 11.3 g/dL — AB (ref 12.0–15.0)
LYMPHS PCT: 39 % (ref 12–46)
Lymphs Abs: 1.3 10*3/uL (ref 0.7–4.0)
MCH: 33.2 pg (ref 26.0–34.0)
MCHC: 34.6 g/dL (ref 30.0–36.0)
MCV: 96.2 fL (ref 78.0–100.0)
MONO ABS: 0.4 10*3/uL (ref 0.1–1.0)
MONOS PCT: 11 % (ref 3–12)
NEUTROS PCT: 48 % (ref 43–77)
Neutro Abs: 1.6 10*3/uL — ABNORMAL LOW (ref 1.7–7.7)
Platelets: 170 10*3/uL (ref 150–400)
RBC: 3.4 MIL/uL — ABNORMAL LOW (ref 3.87–5.11)
RDW: 14.9 % (ref 11.5–15.5)
WBC: 3.3 10*3/uL — AB (ref 4.0–10.5)

## 2013-10-22 LAB — TYPE AND SCREEN
ABO/RH(D): B POS
Antibody Screen: NEGATIVE

## 2013-10-22 LAB — PROTIME-INR
INR: 1.03 (ref 0.00–1.49)
Prothrombin Time: 13.3 seconds (ref 11.6–15.2)

## 2013-10-22 LAB — ABO/RH: ABO/RH(D): B POS

## 2013-10-22 NOTE — Progress Notes (Signed)
Spoke with Dr Harlow Mares office patient has an appointment at 1100 AM and they will talk with her concerning his part in her surgery. She requested  not to sign consent for Dr Zella Richer until seeing Dr Harlow Mares.

## 2013-10-24 ENCOUNTER — Encounter: Payer: Self-pay | Admitting: Genetic Counselor

## 2013-10-24 ENCOUNTER — Telehealth: Payer: Self-pay | Admitting: Genetic Counselor

## 2013-10-24 NOTE — Progress Notes (Signed)
Location of Breast Cancer: Left breast  Histology per Pathology Report 04/24/13  Diagnosis Breast, left, needle core biopsy, left axilla, lymph node - BENIGN LYMPH NODE. - NEGATIVE FOR MALIGNANCY. [name] DO  Receptor Status: ER(-), PR (-), Her2-neu no amplification  Did patient present with symptoms (if so, please note symptoms) or was this found on screening mammography?:  Screening mammogram  Past/Anticipated interventions by surgeon, if any: tentatively scheduled for bilateral mastectomies, reconstruction on 10/30/13 with Dr.Rosenbower, Dr Bowers  Past/Anticipated interventions by medical oncology, if any: Dr Magrinat: Chemotherapy of 4 doses of doxorubicin/cclophophamide and 12 weekly treatments,    carboplatin/paclitaxel completed week of 09/24/2013.  Lymphedema issues, if any: no  Pain issues, if any:  no  SAFETY ISSUES:  Prior radiation? no  Pacemaker/ICD? no  Possible current pregnancy? no  Is the patient on methotrexate? no  Current Complaints / other details: Menarche age 14, first live birth age 39. GX P2, ages 9- daughter, age 6- son. Postmenopausal. Married. 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 Loss of appetite, fatigue.    Vance, Brittney Marie, RN 10/24/2013,4:31 PM   

## 2013-10-24 NOTE — Telephone Encounter (Signed)
Revealed negative genetic test results but has a BRCA2 VUS.

## 2013-10-25 ENCOUNTER — Other Ambulatory Visit: Payer: Self-pay | Admitting: Oncology

## 2013-10-25 NOTE — Progress Notes (Unsigned)
she had a negative genetic test, but had a BRCA2 VUS

## 2013-10-26 ENCOUNTER — Encounter: Payer: Self-pay | Admitting: Radiation Oncology

## 2013-10-26 ENCOUNTER — Ambulatory Visit
Admission: RE | Admit: 2013-10-26 | Discharge: 2013-10-26 | Disposition: A | Discharge status: Home / Self Care | Payer: BC Managed Care – PPO | Source: Ambulatory Visit | Attending: Radiation Oncology | Admitting: Radiation Oncology

## 2013-10-26 VITALS — BP 128/89 | HR 108 | Temp 98.5°F | Resp 20 | Wt 135.0 lb

## 2013-10-26 DIAGNOSIS — C50419 Malignant neoplasm of upper-outer quadrant of unspecified female breast: Secondary | ICD-10-CM | POA: Insufficient documentation

## 2013-10-26 DIAGNOSIS — C50919 Malignant neoplasm of unspecified site of unspecified female breast: Secondary | ICD-10-CM | POA: Insufficient documentation

## 2013-10-26 DIAGNOSIS — Z9221 Personal history of antineoplastic chemotherapy: Secondary | ICD-10-CM | POA: Insufficient documentation

## 2013-10-26 DIAGNOSIS — C50412 Malignant neoplasm of upper-outer quadrant of left female breast: Secondary | ICD-10-CM

## 2013-10-26 DIAGNOSIS — Z79899 Other long term (current) drug therapy: Secondary | ICD-10-CM | POA: Insufficient documentation

## 2013-10-26 NOTE — Addendum Note (Signed)
Encounter addended by: [name], MD on: 10/26/2013  8:34 AM<BR>     Documentation filed: Notes Section

## 2013-10-26 NOTE — Progress Notes (Signed)
   Department of Radiation Oncology  Phone:  [phone] Fax:        [phone]   Name: Brittney Vance MRN: [id number]  DOB: 1965/10/23  Date: 10/26/2013  Follow Up Visit Note  Diagnosis: T2N0 triple negative left breast cancer  Interval History: Brittney Vance presents today for routine followup.  She has completed her chemotherapy and is scheduled with Dr. Zella Richer and Dr. Harlow Mares for bilateral mastectomies next week. Her MRI showed a positive treatment response. When I initially had seen her in breast clinic there was concerned about a left axillary lymph node. She has had.biopsied and it was negative. She wanted to discuss whether she would require radiation with me today.  Allergies:  Allergies  Allergen Reactions  . Chocolate Itching  . Latex Itching  . Shrimp [Shellfish Allergy] Itching  . Wheat Bran Itching    Medications:  Current Outpatient Prescriptions  Medication Sig Dispense Refill  . hydrochlorothiazide (HYDRODIURIL) 25 MG tablet Take 25 mg by mouth daily after lunch.      Marland Kitchen HYDROcodone-acetaminophen (NORCO/VICODIN) 5-325 MG per tablet TAKE 1 OR 2 TABLETS BY MOUTH EVERY 6 HOURS AS NEEDED FOR PAIN  30 tablet  0  . levothyroxine (SYNTHROID, LEVOTHROID) 88 MCG tablet Take 88 mcg by mouth daily before breakfast.      . lidocaine-prilocaine (EMLA) cream Apply topically as needed. 1-2 hrs before each procedure  30 g  2  . Multiple Vitamins-Minerals (MULTIVITAMIN PO) Take 1 tablet by mouth daily.      Marland Kitchen tobramycin-dexamethasone (TOBRADEX) ophthalmic solution Place 1 drop into both eyes 2 (two) times daily.  5 mL  0   No current facility-administered medications for this encounter.    Physical Exam:  Filed Vitals:   10/26/13 0843  BP: 128/89  Pulse: 108  Temp: 98.5 F (36.9 C)  TempSrc: Oral  Resp: 20  Weight: 135 lb (61.236 kg)   pleasant female in no distress sitting comfortably on examining room table. Alert and oriented x3.  IMPRESSION: Brittney Vance is a 48 y.o. female  with a T2 N0 triple negative left breast cancer  PLAN:  [name] does not have any criteria for postmastectomy radiation. Luckily we biopsied these lymph nodes at the beginning and found they were negative. For this reason she can proceed on with mastectomy and immediate reconstruction with a low rate of needing postmastectomy radiation. I discussed this with her. We discussed the possibilities of postmastectomy radiation in patients who are node positive for her tumor size greater than 5 cm. She currently has neither of these criteria and did not have these criteria diagnosis. I think she can proceed on. I will be happy to see her back on an as-needed basis.    [name], MD

## 2013-10-26 NOTE — Progress Notes (Signed)
Please see the Nurse Progress Note in the MD Initial Consult Encounter for this patient. 

## 2013-10-29 ENCOUNTER — Telehealth (INDEPENDENT_AMBULATORY_CARE_PROVIDER_SITE_OTHER): Payer: Self-pay

## 2013-10-29 ENCOUNTER — Telehealth (INDEPENDENT_AMBULATORY_CARE_PROVIDER_SITE_OTHER): Payer: Self-pay | Admitting: General Surgery

## 2013-10-29 MED ORDER — HEPARIN SODIUM (PORCINE) 5000 UNIT/ML IJ SOLN
5000.0000 [IU] | Freq: Once | INTRAMUSCULAR | Status: AC
  Administered 2013-10-30: 5000 [IU] via SUBCUTANEOUS
  Filled 2013-10-29: qty 1

## 2013-10-29 MED ORDER — SODIUM CHLORIDE 0.9 % IV SOLN
Freq: Once | INTRAVENOUS | Status: DC
  Filled 2013-10-29: qty 1

## 2013-10-29 MED ORDER — CEFAZOLIN SODIUM-DEXTROSE 2-3 GM-% IV SOLR
2.0000 g | INTRAVENOUS | Status: AC
  Administered 2013-10-30: 1 g via INTRAVENOUS
  Administered 2013-10-30: 2 g via INTRAVENOUS
  Filled 2013-10-29: qty 50

## 2013-10-29 NOTE — Telephone Encounter (Signed)
She had questions about her surgery tomorrow specifically the nuclear medicine injection for sentinel lymph node biopsy. We have discussed this in detail in the past. She is also having a runny nose and thinks it's allergies. She's been taking some Benadryl I told her was okay. I told her she can also take some Claritin or something else to help her with a runny nose. She states she's not running a fever. I answered her questions about the sentinel lymph node biopsy. Plan to proceed with the operation tomorrow.

## 2013-10-29 NOTE — Telephone Encounter (Signed)
Pt called stating she does not understand why she has to have radioactive study to locate nodes at time of surgery. I did try to explain to pt to procedure and need. Pt states she still wants Dr Zella Richer to call her today. Pt can be reached at [phone].

## 2013-10-29 NOTE — Telephone Encounter (Signed)
The procedure has been discussed extensively with her and at least 2 separate occasions. Will try to call her later today.

## 2013-10-30 ENCOUNTER — Encounter (HOSPITAL_COMMUNITY)
Admission: RE | Admit: 2013-10-30 | Discharge: 2013-10-30 | Disposition: A | Discharge status: Home / Self Care | Payer: BC Managed Care – PPO | Source: Ambulatory Visit | Attending: General Surgery | Admitting: General Surgery

## 2013-10-30 ENCOUNTER — Ambulatory Visit (HOSPITAL_COMMUNITY): Payer: BC Managed Care – PPO | Admitting: Anesthesiology

## 2013-10-30 ENCOUNTER — Inpatient Hospital Stay (HOSPITAL_COMMUNITY)
Admission: RE | Admit: 2013-10-30 | Discharge: 2013-11-02 | DRG: 581 | Disposition: A | Discharge status: Home / Self Care | Payer: BC Managed Care – PPO | Source: Ambulatory Visit | Attending: Plastic Surgery | Admitting: Plastic Surgery

## 2013-10-30 ENCOUNTER — Other Ambulatory Visit (HOSPITAL_COMMUNITY): Payer: BC Managed Care – PPO

## 2013-10-30 ENCOUNTER — Encounter (HOSPITAL_COMMUNITY): Payer: Self-pay | Admitting: *Deleted

## 2013-10-30 ENCOUNTER — Encounter (HOSPITAL_COMMUNITY): Payer: BC Managed Care – PPO | Admitting: Anesthesiology

## 2013-10-30 ENCOUNTER — Encounter (HOSPITAL_COMMUNITY)
Admission: RE | Disposition: A | Discharge status: Home / Self Care | Payer: Self-pay | Source: Ambulatory Visit | Attending: Plastic Surgery

## 2013-10-30 DIAGNOSIS — C50919 Malignant neoplasm of unspecified site of unspecified female breast: Secondary | ICD-10-CM

## 2013-10-30 DIAGNOSIS — Z8249 Family history of ischemic heart disease and other diseases of the circulatory system: Secondary | ICD-10-CM

## 2013-10-30 DIAGNOSIS — Z7901 Long term (current) use of anticoagulants: Secondary | ICD-10-CM

## 2013-10-30 DIAGNOSIS — Z91018 Allergy to other foods: Secondary | ICD-10-CM

## 2013-10-30 DIAGNOSIS — Z452 Encounter for adjustment and management of vascular access device: Secondary | ICD-10-CM

## 2013-10-30 DIAGNOSIS — Z820 Family history of epilepsy and other diseases of the nervous system: Secondary | ICD-10-CM

## 2013-10-30 DIAGNOSIS — R110 Nausea: Secondary | ICD-10-CM | POA: Diagnosis not present

## 2013-10-30 DIAGNOSIS — Z79899 Other long term (current) drug therapy: Secondary | ICD-10-CM

## 2013-10-30 DIAGNOSIS — N6089 Other benign mammary dysplasias of unspecified breast: Secondary | ICD-10-CM

## 2013-10-30 DIAGNOSIS — Z91040 Latex allergy status: Secondary | ICD-10-CM

## 2013-10-30 DIAGNOSIS — I10 Essential (primary) hypertension: Secondary | ICD-10-CM | POA: Diagnosis present

## 2013-10-30 DIAGNOSIS — Z9851 Tubal ligation status: Secondary | ICD-10-CM

## 2013-10-30 DIAGNOSIS — E039 Hypothyroidism, unspecified: Secondary | ICD-10-CM | POA: Diagnosis present

## 2013-10-30 DIAGNOSIS — Z833 Family history of diabetes mellitus: Secondary | ICD-10-CM

## 2013-10-30 DIAGNOSIS — Z91013 Allergy to seafood: Secondary | ICD-10-CM

## 2013-10-30 HISTORY — PX: TOTAL MASTECTOMY: SHX6129

## 2013-10-30 HISTORY — PX: BREAST RECONSTRUCTION WITH PLACEMENT OF TISSUE EXPANDER AND FLEX HD (ACELLULAR HYDRATED DERMIS): SHX6295

## 2013-10-30 HISTORY — PX: PORT-A-CATH REMOVAL: SHX5289

## 2013-10-30 HISTORY — PX: AXILLARY LYMPH NODE BIOPSY: SHX5737

## 2013-10-30 SURGERY — MASTECTOMY, SIMPLE
Anesthesia: General | Site: Chest | Laterality: Right

## 2013-10-30 MED ORDER — HYDROMORPHONE HCL PF 1 MG/ML IJ SOLN
INTRAMUSCULAR | Status: AC
  Filled 2013-10-30: qty 1

## 2013-10-30 MED ORDER — TECHNETIUM TC 99M SULFUR COLLOID FILTERED: 1.0000 | Freq: Once | INTRAVENOUS | Status: AC | PRN

## 2013-10-30 MED ORDER — FENTANYL CITRATE 0.05 MG/ML IJ SOLN
INTRAMUSCULAR | Status: AC
  Filled 2013-10-30: qty 5

## 2013-10-30 MED ORDER — OXYCODONE HCL 5 MG/5ML PO SOLN: 5.0000 mg | Freq: Once | ORAL | Status: AC | PRN

## 2013-10-30 MED ORDER — METHOCARBAMOL 500 MG PO TABS
500.0000 mg | ORAL_TABLET | Freq: Four times a day (QID) | ORAL | Status: DC | PRN
  Administered 2013-10-30 – 2013-10-31 (×2): 500 mg via ORAL
  Filled 2013-10-30: qty 1

## 2013-10-30 MED ORDER — DEXAMETHASONE SODIUM PHOSPHATE 4 MG/ML IJ SOLN
INTRAMUSCULAR | Status: DC | PRN
  Administered 2013-10-30: 4 mg via INTRAVENOUS

## 2013-10-30 MED ORDER — 0.9 % SODIUM CHLORIDE (POUR BTL) OPTIME
TOPICAL | Status: DC | PRN
  Administered 2013-10-30: 3000 mL
  Administered 2013-10-30: 2000 mL

## 2013-10-30 MED ORDER — HYDROMORPHONE HCL PF 1 MG/ML IJ SOLN
INTRAMUSCULAR | Status: DC | PRN
  Administered 2013-10-30 (×2): 1 mg via INTRAVENOUS

## 2013-10-30 MED ORDER — ONDANSETRON HCL 4 MG/2ML IJ SOLN
4.0000 mg | Freq: Four times a day (QID) | INTRAMUSCULAR | Status: DC | PRN
  Administered 2013-10-30 – 2013-10-31 (×2): 4 mg via INTRAVENOUS
  Filled 2013-10-30 (×3): qty 2

## 2013-10-30 MED ORDER — HYDROCHLOROTHIAZIDE 25 MG PO TABS
25.0000 mg | ORAL_TABLET | Freq: Every day | ORAL | Status: DC
  Administered 2013-10-31 – 2013-11-02 (×3): 25 mg via ORAL
  Filled 2013-10-30 (×3): qty 1

## 2013-10-30 MED ORDER — SODIUM CHLORIDE 0.9 % IJ SOLN
INTRAMUSCULAR | Status: AC
  Filled 2013-10-30: qty 10

## 2013-10-30 MED ORDER — NEOSTIGMINE METHYLSULFATE 1 MG/ML IJ SOLN
INTRAMUSCULAR | Status: DC | PRN
  Administered 2013-10-30: 3 mg via INTRAVENOUS

## 2013-10-30 MED ORDER — ROCURONIUM BROMIDE 100 MG/10ML IV SOLN
INTRAVENOUS | Status: DC | PRN
  Administered 2013-10-30: 50 mg via INTRAVENOUS

## 2013-10-30 MED ORDER — DEXTROSE-NACL 5-0.45 % IV SOLN
INTRAVENOUS | Status: DC
  Administered 2013-10-30: 100 mL/h via INTRAVENOUS
  Administered 2013-11-01 – 2013-11-02 (×3): via INTRAVENOUS

## 2013-10-30 MED ORDER — ONDANSETRON HCL 4 MG/2ML IJ SOLN: 4.0000 mg | Freq: Four times a day (QID) | INTRAMUSCULAR | Status: DC | PRN

## 2013-10-30 MED ORDER — VECURONIUM BROMIDE 10 MG IV SOLR
INTRAVENOUS | Status: AC
  Filled 2013-10-30: qty 10

## 2013-10-30 MED ORDER — PROPOFOL 10 MG/ML IV BOLUS
INTRAVENOUS | Status: DC | PRN
  Administered 2013-10-30: 170 mg via INTRAVENOUS

## 2013-10-30 MED ORDER — OXYCODONE HCL 5 MG PO TABS
ORAL_TABLET | ORAL | Status: AC
Start: 2013-10-30 — End: 2013-10-31
  Filled 2013-10-30: qty 1

## 2013-10-30 MED ORDER — SODIUM CHLORIDE 0.9 % IJ SOLN
9.0000 mL | INTRAMUSCULAR | Status: DC | PRN
Start: 2013-10-30 — End: 2013-10-31

## 2013-10-30 MED ORDER — HYDROMORPHONE 0.3 MG/ML IV SOLN
INTRAVENOUS | Status: AC
  Filled 2013-10-30: qty 25

## 2013-10-30 MED ORDER — METOPROLOL TARTRATE 1 MG/ML IV SOLN
INTRAVENOUS | Status: DC | PRN
  Administered 2013-10-30: 2.5 mg via INTRAVENOUS

## 2013-10-30 MED ORDER — MIDAZOLAM HCL 2 MG/2ML IJ SOLN
INTRAMUSCULAR | Status: AC
  Filled 2013-10-30: qty 2

## 2013-10-30 MED ORDER — ACETAMINOPHEN 325 MG PO TABS
650.0000 mg | ORAL_TABLET | Freq: Four times a day (QID) | ORAL | Status: DC | PRN
  Administered 2013-10-31 – 2013-11-01 (×2): 650 mg via ORAL
  Filled 2013-10-30 (×2): qty 2

## 2013-10-30 MED ORDER — LIDOCAINE HCL (CARDIAC) 20 MG/ML IV SOLN
INTRAVENOUS | Status: DC | PRN
  Administered 2013-10-30: 80 mg via INTRAVENOUS

## 2013-10-30 MED ORDER — HYDROMORPHONE HCL PF 1 MG/ML IJ SOLN
0.2500 mg | INTRAMUSCULAR | Status: DC | PRN
Start: 2013-10-30 — End: 2013-10-30
  Administered 2013-10-30: 0.25 mg via INTRAVENOUS
  Administered 2013-10-30 (×2): 0.5 mg via INTRAVENOUS

## 2013-10-30 MED ORDER — GLYCOPYRROLATE 0.2 MG/ML IJ SOLN
INTRAMUSCULAR | Status: AC
  Filled 2013-10-30: qty 2

## 2013-10-30 MED ORDER — DOCUSATE SODIUM 100 MG PO CAPS
100.0000 mg | ORAL_CAPSULE | Freq: Every day | ORAL | Status: DC
  Administered 2013-10-31 – 2013-11-02 (×3): 100 mg via ORAL
  Filled 2013-10-30 (×4): qty 1

## 2013-10-30 MED ORDER — LEVOTHYROXINE SODIUM 88 MCG PO TABS
88.0000 ug | ORAL_TABLET | Freq: Every day | ORAL | Status: DC
  Administered 2013-10-31 – 2013-11-01 (×2): 88 ug via ORAL
  Filled 2013-10-30 (×5): qty 1

## 2013-10-30 MED ORDER — ONDANSETRON HCL 4 MG/2ML IJ SOLN
INTRAMUSCULAR | Status: AC
  Filled 2013-10-30: qty 2

## 2013-10-30 MED ORDER — HYDROMORPHONE 0.3 MG/ML IV SOLN
INTRAVENOUS | Status: DC
  Administered 2013-10-30: 14:00:00 via INTRAVENOUS
  Administered 2013-10-30: 1.99 mg via INTRAVENOUS
  Administered 2013-10-31: 2.19 mg via INTRAVENOUS

## 2013-10-30 MED ORDER — ROCURONIUM BROMIDE 50 MG/5ML IV SOLN
INTRAVENOUS | Status: AC
  Filled 2013-10-30: qty 1

## 2013-10-30 MED ORDER — OXYCODONE HCL 5 MG PO TABS
5.0000 mg | ORAL_TABLET | Freq: Once | ORAL | Status: AC | PRN
  Administered 2013-10-30: 5 mg via ORAL

## 2013-10-30 MED ORDER — CEFAZOLIN SODIUM 1-5 GM-% IV SOLN
1.0000 g | Freq: Four times a day (QID) | INTRAVENOUS | Status: DC
  Administered 2013-10-30 – 2013-11-02 (×9): 1 g via INTRAVENOUS
  Filled 2013-10-30 (×15): qty 50

## 2013-10-30 MED ORDER — PHENYLEPHRINE HCL 10 MG/ML IJ SOLN
INTRAMUSCULAR | Status: DC | PRN
  Administered 2013-10-30: 80 ug via INTRAVENOUS

## 2013-10-30 MED ORDER — MIDAZOLAM HCL 5 MG/5ML IJ SOLN
INTRAMUSCULAR | Status: DC | PRN
  Administered 2013-10-30: 2 mg via INTRAVENOUS

## 2013-10-30 MED ORDER — TOBRAMYCIN-DEXAMETHASONE 0.3-0.1 % OP SUSP
1.0000 [drp] | Freq: Two times a day (BID) | OPHTHALMIC | Status: DC
  Administered 2013-10-30: 1 [drp] via OPHTHALMIC
  Filled 2013-10-30: qty 2.5

## 2013-10-30 MED ORDER — CEFAZOLIN SODIUM-DEXTROSE 2-3 GM-% IV SOLR: 2.0000 g | INTRAVENOUS | Status: DC

## 2013-10-30 MED ORDER — VECURONIUM BROMIDE 10 MG IV SOLR
INTRAVENOUS | Status: DC | PRN
  Administered 2013-10-30: 2 mg via INTRAVENOUS
  Administered 2013-10-30 (×2): 1 mg via INTRAVENOUS
  Administered 2013-10-30: 2 mg via INTRAVENOUS
  Administered 2013-10-30 (×3): 1 mg via INTRAVENOUS

## 2013-10-30 MED ORDER — CHLORHEXIDINE GLUCONATE 4 % EX LIQD
1.0000 "application " | Freq: Once | CUTANEOUS | Status: DC
  Filled 2013-10-30: qty 15

## 2013-10-30 MED ORDER — CEFAZOLIN SODIUM 1-5 GM-% IV SOLN
1.0000 g | INTRAVENOUS | Status: DC
  Filled 2013-10-30: qty 50

## 2013-10-30 MED ORDER — FENTANYL CITRATE 0.05 MG/ML IJ SOLN
INTRAMUSCULAR | Status: DC | PRN
  Administered 2013-10-30: 100 ug via INTRAVENOUS
  Administered 2013-10-30 (×2): 50 ug via INTRAVENOUS
  Administered 2013-10-30: 100 ug via INTRAVENOUS
  Administered 2013-10-30 (×4): 50 ug via INTRAVENOUS

## 2013-10-30 MED ORDER — LIDOCAINE HCL (CARDIAC) 20 MG/ML IV SOLN
INTRAVENOUS | Status: AC
  Filled 2013-10-30: qty 5

## 2013-10-30 MED ORDER — SODIUM CHLORIDE 0.9 % IR SOLN
Status: DC | PRN
  Administered 2013-10-30: 1000 mL

## 2013-10-30 MED ORDER — GLYCOPYRROLATE 0.2 MG/ML IJ SOLN
INTRAMUSCULAR | Status: DC | PRN
  Administered 2013-10-30: 0.4 mg via INTRAVENOUS

## 2013-10-30 MED ORDER — SODIUM CHLORIDE 0.9 % IV SOLN
INTRAVENOUS | Status: DC | PRN
  Administered 2013-10-30 (×2)

## 2013-10-30 MED ORDER — LACTATED RINGERS IV SOLN
INTRAVENOUS | Status: DC | PRN
  Administered 2013-10-30 (×4): via INTRAVENOUS

## 2013-10-30 MED ORDER — NALOXONE HCL 0.4 MG/ML IJ SOLN
0.4000 mg | INTRAMUSCULAR | Status: DC | PRN
  Filled 2013-10-30: qty 1

## 2013-10-30 MED ORDER — SODIUM CHLORIDE 0.9 % IV SOLN
INTRAVENOUS | Status: DC
  Filled 2013-10-30: qty 1

## 2013-10-30 MED ORDER — ONDANSETRON HCL 4 MG/2ML IJ SOLN
INTRAMUSCULAR | Status: DC | PRN
  Administered 2013-10-30: 4 mg via INTRAVENOUS

## 2013-10-30 MED ORDER — NEOSTIGMINE METHYLSULFATE 1 MG/ML IJ SOLN
INTRAMUSCULAR | Status: AC
  Filled 2013-10-30: qty 10

## 2013-10-30 MED ORDER — DIPHENHYDRAMINE HCL 12.5 MG/5ML PO ELIX
12.5000 mg | ORAL_SOLUTION | Freq: Four times a day (QID) | ORAL | Status: DC | PRN
Start: 2013-10-30 — End: 2013-10-31
  Filled 2013-10-30: qty 5

## 2013-10-30 MED ORDER — PROPOFOL 10 MG/ML IV BOLUS
INTRAVENOUS | Status: AC
  Filled 2013-10-30: qty 20

## 2013-10-30 MED ORDER — METHOCARBAMOL 500 MG PO TABS
ORAL_TABLET | ORAL | Status: AC
  Filled 2013-10-30: qty 1

## 2013-10-30 MED ORDER — PROMETHAZINE HCL 25 MG/ML IJ SOLN: 6.2500 mg | INTRAMUSCULAR | Status: DC | PRN

## 2013-10-30 MED ORDER — DIPHENHYDRAMINE HCL 50 MG/ML IJ SOLN
12.5000 mg | Freq: Four times a day (QID) | INTRAMUSCULAR | Status: DC | PRN
  Filled 2013-10-30: qty 0.25

## 2013-10-30 SURGICAL SUPPLY — 93 items
ADH SKN CLS APL DERMABOND .7 (GAUZE/BANDAGES/DRESSINGS) ×2
APPLIER CLIP 9.375 MED OPEN (MISCELLANEOUS) ×5
APR CLP MED 9.3 20 MLT OPN (MISCELLANEOUS) ×1
ATCH SMKEVC FLXB CAUT HNDSWH (FILTER) ×3 IMPLANT
BAG DECANTER FOR FLEXI CONT (MISCELLANEOUS) ×10 IMPLANT
BENZOIN TINCTURE PRP APPL 2/3 (GAUZE/BANDAGES/DRESSINGS) ×5 IMPLANT
BINDER BREAST LRG (GAUZE/BANDAGES/DRESSINGS) ×5 IMPLANT
BINDER BREAST XLRG (GAUZE/BANDAGES/DRESSINGS) IMPLANT
BIOPATCH RED 1 DISK 7.0 (GAUZE/BANDAGES/DRESSINGS) ×16 IMPLANT
BIOPATCH RED 1IN DISK 7.0MM (GAUZE/BANDAGES/DRESSINGS) ×4
BLADE SURG 15 STRL LF DISP TIS (BLADE) ×9 IMPLANT
BLADE SURG 15 STRL SS (BLADE) ×9
CANISTER SUCTION 2500CC (MISCELLANEOUS) ×5 IMPLANT
CATH FOLEY LATEX FREE 16FR (CATHETERS) ×3
CATH FOLEY LF 16FR (CATHETERS) ×3 IMPLANT
CHLORAPREP W/TINT 10.5 ML (MISCELLANEOUS) ×5 IMPLANT
CHLORAPREP W/TINT 26ML (MISCELLANEOUS) ×10 IMPLANT
CLIP APPLIE 9.375 MED OPEN (MISCELLANEOUS) ×3 IMPLANT
CLOSURE WOUND 1/2 X4 (GAUZE/BANDAGES/DRESSINGS) ×1
CONT SPECI 4OZ STER CLIK (MISCELLANEOUS) ×10 IMPLANT
COVER SURGICAL LIGHT HANDLE (MISCELLANEOUS) ×10 IMPLANT
DECANTER SPIKE VIAL GLASS SM (MISCELLANEOUS) ×10 IMPLANT
DERMABOND ADVANCED (GAUZE/BANDAGES/DRESSINGS) ×4
DERMABOND ADVANCED .7 DNX12 (GAUZE/BANDAGES/DRESSINGS) ×6 IMPLANT
DRAIN CHANNEL 19F RND (DRAIN) ×20 IMPLANT
DRAPE CHEST BREAST 15X10 FENES (DRAPES) IMPLANT
DRAPE ORTHO SPLIT 77X108 STRL (DRAPES) ×4
DRAPE PED LAPAROTOMY (DRAPES) ×5 IMPLANT
DRAPE PROXIMA HALF (DRAPES) ×20 IMPLANT
DRAPE SURG 17X23 STRL (DRAPES) ×10 IMPLANT
DRAPE SURG ORHT 6 SPLT 77X108 (DRAPES) ×6 IMPLANT
DRAPE UTILITY 15X26 W/TAPE STR (DRAPE) ×10 IMPLANT
DRAPE WARM FLUID 44X44 (DRAPE) ×10 IMPLANT
DRSG PAD ABDOMINAL 8X10 ST (GAUZE/BANDAGES/DRESSINGS) ×5 IMPLANT
DRSG TEGADERM 4X4.75 (GAUZE/BANDAGES/DRESSINGS) ×20 IMPLANT
ELECT BLADE 6.5 EXT (BLADE) ×5 IMPLANT
ELECT CAUTERY BLADE 6.4 (BLADE) ×10 IMPLANT
ELECT REM PT RETURN 9FT ADLT (ELECTROSURGICAL) ×10
ELECTRODE REM PT RTRN 9FT ADLT (ELECTROSURGICAL) ×6 IMPLANT
EVACUATOR SILICONE 100CC (DRAIN) ×15 IMPLANT
EVACUATOR SMOKE ACCUVAC VALLEY (FILTER) ×2
GAUZE SPONGE 2X2 8PLY STRL LF (GAUZE/BANDAGES/DRESSINGS) ×3 IMPLANT
GAUZE SPONGE 4X4 16PLY XRAY LF (GAUZE/BANDAGES/DRESSINGS) ×5 IMPLANT
GLOVE BIO SURGEON STRL SZ7.5 (GLOVE) ×5 IMPLANT
GLOVE BIOGEL PI IND STRL 6.5 (GLOVE) ×3 IMPLANT
GLOVE BIOGEL PI IND STRL 7.0 (GLOVE) ×9 IMPLANT
GLOVE BIOGEL PI IND STRL 8 (GLOVE) ×6 IMPLANT
GLOVE BIOGEL PI INDICATOR 6.5 (GLOVE) ×2
GLOVE BIOGEL PI INDICATOR 7.0 (GLOVE) ×6
GLOVE BIOGEL PI INDICATOR 8 (GLOVE) ×4
GLOVE ECLIPSE 8.0 STRL XLNG CF (GLOVE) IMPLANT
GLOVE SURG SS PI 8.0 STRL IVOR (GLOVE) ×10 IMPLANT
GOWN STRL REUS W/ TWL LRG LVL3 (GOWN DISPOSABLE) ×12 IMPLANT
GOWN STRL REUS W/ TWL XL LVL3 (GOWN DISPOSABLE) ×9 IMPLANT
GOWN STRL REUS W/TWL 2XL LVL3 (GOWN DISPOSABLE) ×5 IMPLANT
GOWN STRL REUS W/TWL LRG LVL3 (GOWN DISPOSABLE) ×8
GOWN STRL REUS W/TWL XL LVL3 (GOWN DISPOSABLE) ×15
GRAFT FLEX HD 4X16 THICK (Tissue Mesh) ×5 IMPLANT
GRAFT FLEX HD 8X16 THICK (Tissue Mesh) ×5 IMPLANT
IMPL BREAST TIS EXP M 650CC (Breast) ×6 IMPLANT
IMPLANT BREAST TIS EXP M 650CC (Breast) ×10 IMPLANT
KIT BASIN OR (CUSTOM PROCEDURE TRAY) ×10 IMPLANT
KIT ROOM TURNOVER OR (KITS) ×10 IMPLANT
MARKER SKIN DUAL TIP RULER LAB (MISCELLANEOUS) ×5 IMPLANT
NEEDLE HYPO 25GX1X1/2 BEV (NEEDLE) ×5 IMPLANT
NS IRRIG 1000ML POUR BTL (IV SOLUTION) ×15 IMPLANT
PACK GENERAL/GYN (CUSTOM PROCEDURE TRAY) ×10 IMPLANT
PACK SURGICAL SETUP 50X90 (CUSTOM PROCEDURE TRAY) ×5 IMPLANT
PAD ARMBOARD 7.5X6 YLW CONV (MISCELLANEOUS) ×15 IMPLANT
PENCIL BUTTON HOLSTER BLD 10FT (ELECTRODE) ×5 IMPLANT
PIN SAFETY STERILE (MISCELLANEOUS) ×5 IMPLANT
PREFILTER EVAC NS 1 1/3-3/8IN (MISCELLANEOUS) ×5 IMPLANT
SPONGE GAUZE 2X2 STER 10/PKG (GAUZE/BANDAGES/DRESSINGS) ×2
SPONGE GAUZE 4X4 12PLY (GAUZE/BANDAGES/DRESSINGS) ×5 IMPLANT
SPONGE LAP 18X18 X RAY DECT (DISPOSABLE) ×5 IMPLANT
STAPLER VISISTAT (STAPLE) ×5 IMPLANT
STAPLER VISISTAT 35W (STAPLE) ×5 IMPLANT
STRIP CLOSURE SKIN 1/2X4 (GAUZE/BANDAGES/DRESSINGS) ×4 IMPLANT
SUT ETHILON 2 0 FS 18 (SUTURE) IMPLANT
SUT MNCRL AB 3-0 PS2 18 (SUTURE) ×25 IMPLANT
SUT MON AB 4-0 PC3 18 (SUTURE) ×5 IMPLANT
SUT PDS AB 3-0 SH 27 (SUTURE) ×25 IMPLANT
SUT PROLENE 3 0 PS 2 (SUTURE) ×20 IMPLANT
SUT VIC AB 3-0 SH 18 (SUTURE) ×15 IMPLANT
SUT VIC AB 3-0 SH 27 (SUTURE) ×4
SUT VIC AB 3-0 SH 27X BRD (SUTURE) ×3 IMPLANT
SYR BULB IRRIGATION 50ML (SYRINGE) ×10 IMPLANT
SYR CONTROL 10ML LL (SYRINGE) ×5 IMPLANT
TOWEL OR 17X24 6PK STRL BLUE (TOWEL DISPOSABLE) ×15 IMPLANT
TOWEL OR 17X26 10 PK STRL BLUE (TOWEL DISPOSABLE) ×15 IMPLANT
TUBE CONNECTING 12'X1/4 (SUCTIONS) ×1
TUBE CONNECTING 12X1/4 (SUCTIONS) ×4 IMPLANT
WATER STERILE IRR 1000ML POUR (IV SOLUTION) ×5 IMPLANT

## 2013-10-30 NOTE — Brief Op Note (Signed)
10/30/2013  1:35 PM  PATIENT:  Brittney Vance  48 y.o. female  PRE-OPERATIVE DIAGNOSIS:  left breast cancer   POST-OPERATIVE DIAGNOSIS:  left breast cancer   PROCEDURE:  Procedure(s): TOTAL MASTECTOMY (Bilateral) AXILLARY LYMPH NODE BIOPSY (Left) REMOVAL PORT-A-CATH (Right) BILATERAL BREAST RECONSTRUCTION WITH PLACEMENT OF TISSUE EXPANDER AND FLEX HD (ACELLULAR HYDRATED DERMIS) (Bilateral)  SURGEON:  Surgeon(s) and Role: Panel 1:    * Stark Klein, MD - Assisting    * [name], MD - Primary  Panel 2:    * [name], MD - Primary  PHYSICIAN ASSISTANT:   ASSISTANTS: Judyann Munson, RNFA   ANESTHESIA:   general  EBL:  Total I/O In: 3200 [I.V.:3200] Out: 425 [Urine:275; Blood:150]  BLOOD ADMINISTERED:none  DRAINS: (4) Jackson-Pratt drain(s) with closed bulb suction in the left chest (2) and right chest (2)   LOCAL MEDICATIONS USED:  NONE  SPECIMEN:  No Specimen  DISPOSITION OF SPECIMEN:  N/A  COUNTS:  YES  TOURNIQUET:  * No tourniquets in log *   DICTATION: .Other Dictation: Dictation Number [id number]  PLAN OF CARE: Admit to inpatient   PATIENT DISPOSITION:  PACU - hemodynamically stable.   Delay start of Pharmacological VTE agent (>24hrs) due to surgical blood loss or risk of bleeding: no (she had heparin subcu pre-op)

## 2013-10-30 NOTE — OR Nursing (Addendum)
Right mastectomy weight is 717 grams. Left mastectomy weight is 748 grams. 1st procedure operative time 0804--[id number]

## 2013-10-30 NOTE — Op Note (Signed)
Operative Note  Brittney Vance female 48 y.o. 10/30/2013  PREOPERATIVE DX:  Invasive left breast cancer  POSTOPERATIVE DX:  Same  PROCEDURE:  Left axillary lymphatic mapping. Bilateral mastectomies. Left axillary sentinel lymph node biopsy (2 lymph nodes). Port-A-Cath removal         Surgeon: [name]   Assistants: Stark Klein, M.D.  Anesthesia: General endotracheal anesthesia  Indications:   This is a 48 year old female with invasive left breast cancer. She had neoadjuvant chemotherapy by way of a Port-A-Cath in the right upper chest wall. She has completed her treatments. After being given all options for treatment, she has chosen bilateral mastectomies as well as axillary sentinel lymph node biopsy. She no longer needs the Port-A-Cath so it will also be removed. This is to be followed by a placement of tissue expanders by Dr. Harlow Mares.    Procedure Detail:  She was seen in the holding area and the left axillary area marked my initials. She was given radioactive injection in the left periareolar area. She was then brought to the operating room, placed upon the operating table, and a general anesthetic was given. The upper abdomen, chest wall, axillary areas and proximal arms, and neck were then sterilely prepped and draped.  Beginning on the right side, an elliptical incision was made in the skin and dermis of the breast to include the nipple areolar complex. Using electrocautery subcutaneous flaps were raised medially to the lateral border of the sternum, superiorly to the clavicle, inferiorly to the anterior rectus sheath edge, and laterally to the latissimus dorsi muscle.  During the superior dissection, I removed the Port-A-Cath intact. The breast tissues and dissected off the pectoralis major muscle and fascia using electrocautery.  The medial aspect of the breast was oriented with suture and was passed off the field. It was inspected and bleeding was controlled with  electrocautery. The wound was then irrigated and hemostasis was adequate. 2 warm moist sponges were then placed in the wound and the skin was loosely approximated over the sponges with staples.  Next, the left axilla was approached. A transverse incision was made in the inferior axillary area and the subcutaneous tissues were divided with electrocautery until the axillary contents were entered. Using the neoprobe identified and lymph node with counts of approximately 3000. This was removed with electrocautery and labeled sentinel lymph node #1. I then probed the wound again with the neoprobe and noted area of counts of 600. I identified a lymph node (#2) was radioactive and removed this as well with electrocautery. No other increased counts were noted in the left axillary area.The left axilla was inspected and hemostasis was adequate. The subcutaneous tissues and closed with interrupted 3-0 Vicryl sutures. The skin was closed with 4-0 Monocryl subcuticular stitch. The lymph nodes were sent to pathology.  Next, the left breast was approached. Elliptical incision was made to include the nipple areolar complex through the skin and dermis.  Subcutaneous flaps were raised superiorly to the clavicle, medially to the lateral border of the sternum, inferiorly to the edge of the anterior rectus sheath, and laterally to the latissimus dorsi muscle. The breast was then dissected free from the underlying pectoralis major muscle and fascia using electrocautery. Bleeding points were controlled with electrocautery.  The medial aspect of the breast was marked with a suture and the breast was sent to pathology.  The wound was inspected and hemostasis was adequate. The wound was irrigated. 2 warm moist sponges were then placed in the wound  and the skin was loosely closed with staples. Dr. Harlow Mares then proceeded with the placement of tissue expanders which be dictated in a separate note. She tolerated this portion of the procedure  well without any apparent complications.  Estimated Blood Loss:  200 mL         Drains: none  Blood Given: none          Specimens: Right breast, left breast, left axillary sentinel lymph nodes.        Complications:  * No complications entered in OR log *          Condition: stable

## 2013-10-30 NOTE — H&P (View-Only) (Signed)
Patient ID: Brittney Vance, female   DOB: 06-21-1966, 48 y.o.   MRN: [id number]  CC:  Here for elective surgery for breast cancer.   HPI Brittney Vance is a 48 y.o. female.   HPI  She has invasive left breast cancer and has had neoadjuvant chemotherapy.  She is now here for operative management of her breast cancer.  Past Medical History  Diagnosis Date  . Fibroids   . Hypertension   . Hypothyroid   . Breast cancer 2015    left, triple negative     Past Surgical History  Procedure Laterality Date  . Tubal ligation    . Portacath placement Right 04/27/2013    Procedure: ULTRASOUND GUIDED PORT INSERTION WITH FLUOROSCOPY;  Surgeon: [name], MD;  Location: [facility];  Service: General;  Laterality: Right;  . Breast biopsy Left 04/24/2013    Family History  Problem Relation Age of Onset  . Hypertension Mother   . Diabetes Mother   . Alzheimer's disease Mother   . Hypertension Father   . Diabetes Father   . Hypertension Brother   . Epilepsy Son 4    being worked up for autism    Social History History  Substance Use Topics  . Smoking status: Never Smoker   . Smokeless tobacco: Never Used  . Alcohol Use: 1.2 oz/week    2 Glasses of wine per week     Comment: occassion    Allergies  Allergen Reactions  . Chocolate Itching  . Latex Itching  . Shrimp [Shellfish Allergy] Itching  . Wheat Bran Itching    Current Facility-Administered Medications  Medication Dose Route Frequency Provider Last Rate Last Dose  . bacitracin 50,000 Units, gentamicin (GARAMYCIN) 80 mg, ceFAZolin (ANCEF) 1 g in sodium chloride 0.9 % 1,000 mL   Irrigation Once [name], MD      . ceFAZolin (ANCEF) IVPB 2 g/50 mL premix  2 g Intravenous On Call to OR [name], MD      . ceFAZolin (ANCEF) IVPB 2 g/50 mL premix  2 g Intravenous On Call to OR [name], MD      . chlorhexidine (HIBICLENS) 4 % liquid 1 application  1 application Topical Once [name], MD        Review of  Systems Review of Systems  Constitutional: Negative.   Respiratory: Negative.   Cardiovascular: Negative.     Blood pressure 138/91, pulse 112, temperature 100.2 F (37.9 C), temperature source Oral, resp. rate 20, weight 135 lb (61.236 kg), last menstrual period 03/10/2013, SpO2 100.00%.  Physical Exam Physical Exam  Constitutional: She appears well-developed and well-nourished. No distress.  HENT:  Head: Normocephalic and atraumatic.  Cardiovascular: Normal rate and regular rhythm.   Pulmonary/Chest: Effort normal and breath sounds normal.  No palpable breast masses.  Palpable Port-A-Cath in the right upper chest.  Abdominal: Soft. There is no tenderness.  Musculoskeletal:  No supraclavicular or axillary adenopathy.  Neurological: She is alert.  Skin: Skin is warm and dry.  Psychiatric: She has a normal mood and affect. Her behavior is normal.    Data Reviewed Previous notes.  Assessment    Invasive left breast cancer. She has completed neoadjuvant chemotherapy.     Plan    Bilateral mastectomies, left axillary sentinel lymph node biopsy, Port-A-Cath removal, reconstruction per Dr. Harlow Mares. The procedure and the risks have been discussed with her previously.        Adalyn Pennock J 10/30/2013, 7:21 AM

## 2013-10-30 NOTE — Transfer of Care (Signed)
Immediate Anesthesia Transfer of Care Note  Patient: Brittney Vance  Procedure(s) Performed: Procedure(s): TOTAL MASTECTOMY (Bilateral) AXILLARY LYMPH NODE BIOPSY (Left) REMOVAL PORT-A-CATH (Right) BILATERAL BREAST RECONSTRUCTION WITH PLACEMENT OF TISSUE EXPANDER AND FLEX HD (ACELLULAR HYDRATED DERMIS) (Bilateral)  Patient Location: PACU  Anesthesia Type:General  Level of Consciousness: awake, alert , oriented and patient cooperative  Airway & Oxygen Therapy: Patient Spontanous Breathing and Patient connected to face mask oxygen  Post-op Assessment: Report given to PACU RN and Post -op Vital signs reviewed and stable  Post vital signs: Reviewed and stable  Complications: No apparent anesthesia complications

## 2013-10-30 NOTE — Brief Op Note (Signed)
10/30/2013  10:29 AM  PATIENT:  Brittney Vance  48 y.o. female  PRE-OPERATIVE DIAGNOSIS:  left breast cancer   POST-OPERATIVE DIAGNOSIS:  left breast cancer   PROCEDURE:  Procedure(s): TOTAL MASTECTOMY (Bilateral) AXILLARY LYMPH NODE BIOPSY (Left) REMOVAL PORT-A-CATH (Right) BILATERAL BREAST RECONSTRUCTION WITH PLACEMENT OF TISSUE EXPANDER AND FLEX HD (ACELLULAR HYDRATED DERMIS) (Bilateral)  SURGEON:  Surgeon(s) and Role: Panel 1:    * Stark Klein, MD - Assisting    * [name], MD - Primary  Panel 2:    * [name], MD - Primary  ANESTHESIA:   general  EBL:  Total I/O In: 2000 [I.V.:2000] Out: 225 [Urine:150; Blood:75]  BLOOD ADMINISTERED:none  DRAINS: Per Dr. Harlow Mares   LOCAL MEDICATIONS USED:  NONE  SPECIMEN:  Source of Specimen:  Bilateral breasts, left axillary sentinel lymph nodes.  DISPOSITION OF SPECIMEN:  PATHOLOGY  COUNTS:  NO Case not finished.  TOURNIQUET:  * No tourniquets in log *  DICTATION: .Dragon Dictation  PLAN OF CARE: Admit to inpatient   PATIENT DISPOSITION:  Pending Dr. Harlow Mares   Delay start of Pharmacological VTE agent (>24hrs) due to surgical blood loss or risk of bleeding: no

## 2013-10-30 NOTE — Anesthesia Procedure Notes (Addendum)
Procedure Name: Intubation Date/Time: 10/30/2013 7:38 AM Performed by: Laretta Alstrom Pre-anesthesia Checklist: Patient identified, Emergency Drugs available, Suction available, Patient being monitored and Timeout performed Patient Re-evaluated:Patient Re-evaluated prior to inductionOxygen Delivery Method: Circle system utilized Preoxygenation: Pre-oxygenation with 100% oxygen Intubation Type: IV induction Ventilation: Mask ventilation without difficulty Laryngoscope Size: Miller and 2 Grade View: Grade I Tube type: Oral Tube size: 7.5 mm Number of attempts: 1 Airway Equipment and Method: Stylet Secured at: 22 cm Tube secured with: Tape Dental Injury: Teeth and Oropharynx as per pre-operative assessment

## 2013-10-30 NOTE — Progress Notes (Signed)
Pt. Stating the reason on the consent should be Left breast Cancer. Spoke to Dr. Zella Richer and he stated that is correct and the sentinel node biopsy is on the left.

## 2013-10-30 NOTE — Anesthesia Preprocedure Evaluation (Signed)
Anesthesia Evaluation  Patient identified by MRN, date of birth, ID band Patient awake    Reviewed: Allergy & Precautions, H&P , NPO status , Patient's Chart, lab work & pertinent test results  Airway Mallampati: II  Neck ROM: full    Dental   Pulmonary          Cardiovascular hypertension,     Neuro/Psych    GI/Hepatic   Endo/Other  Hypothyroidism   Renal/GU      Musculoskeletal   Abdominal   Peds  Hematology   Anesthesia Other Findings   Reproductive/Obstetrics                           Anesthesia Physical Anesthesia Plan  ASA: II  Anesthesia Plan: General   Post-op Pain Management:    Induction: Intravenous  Airway Management Planned: Oral ETT  Additional Equipment:   Intra-op Plan:   Post-operative Plan: Extubation in OR  Informed Consent: I have reviewed the patients History and Physical, chart, labs and discussed the procedure including the risks, benefits and alternatives for the proposed anesthesia with the patient or authorized representative who has indicated his/her understanding and acceptance.     Plan Discussed with: CRNA, Anesthesiologist and Surgeon  Anesthesia Plan Comments:         Anesthesia Quick Evaluation

## 2013-10-30 NOTE — Interval H&P Note (Signed)
History and Physical Interval Note:  10/30/2013 7:23 AM  Brittney Vance  has presented today for surgery, with the diagnosis of left breast cancer   The various methods of treatment have been discussed with the patient and family. After consideration of risks, benefits and other options for treatment, the patient has consented to  Procedure(s): TOTAL MASTECTOMY (Bilateral) AXILLARY LYMPH NODE BIOPSY (Left) REMOVAL PORT-A-CATH (N/A) BILATERAL BREAST RECONSTRUCTION WITH PLACEMENT OF TISSUE EXPANDER AND FLEX HD (ACELLULAR HYDRATED DERMIS) (Bilateral) as a surgical intervention .  The patient's history has been reviewed, patient examined, no change in status, stable for surgery.  I have reviewed the patient's chart and labs.  Questions were answered to the patient's satisfaction.     Kden Wagster Lenna Sciara

## 2013-10-30 NOTE — Progress Notes (Signed)
Patient ID: Brittney Vance, female   DOB: 08/28/1965, 48 y.o.   MRN: [id number]  CC:  Here for elective surgery for breast cancer.   HPI Brittney Vance is a 48 y.o. female.   HPI  She has invasive left breast cancer and has had neoadjuvant chemotherapy.  She is now here for operative management of her breast cancer.  Past Medical History  Diagnosis Date  . Fibroids   . Hypertension   . Hypothyroid   . Breast cancer 2015    left, triple negative     Past Surgical History  Procedure Laterality Date  . Tubal ligation    . Portacath placement Right 04/27/2013    Procedure: ULTRASOUND GUIDED PORT INSERTION WITH FLUOROSCOPY;  Surgeon: Yehudah Standing J Quinesha Selinger, MD;  Location: [facility];  Service: General;  Laterality: Right;  . Breast biopsy Left 04/24/2013    Family History  Problem Relation Age of Onset  . Hypertension Mother   . Diabetes Mother   . Alzheimer's disease Mother   . Hypertension Father   . Diabetes Father   . Hypertension Brother   . Epilepsy Son 4    being worked up for autism    Social History History  Substance Use Topics  . Smoking status: Never Smoker   . Smokeless tobacco: Never Used  . Alcohol Use: 1.2 oz/week    2 Glasses of wine per week     Comment: occassion    Allergies  Allergen Reactions  . Chocolate Itching  . Latex Itching  . Shrimp [Shellfish Allergy] Itching  . Wheat Bran Itching    Current Facility-Administered Medications  Medication Dose Route Frequency Provider Last Rate Last Dose  . bacitracin 50,000 Units, gentamicin (GARAMYCIN) 80 mg, ceFAZolin (ANCEF) 1 g in sodium chloride 0.9 % 1,000 mL   Irrigation Once David Bowers, MD      . ceFAZolin (ANCEF) IVPB 2 g/50 mL premix  2 g Intravenous On Call to OR David Bowers, MD      . ceFAZolin (ANCEF) IVPB 2 g/50 mL premix  2 g Intravenous On Call to OR Vernis Eid J Talibah Colasurdo, MD      . chlorhexidine (HIBICLENS) 4 % liquid 1 application  1 application Topical Once David Bowers, MD        Review of  Systems Review of Systems  Constitutional: Negative.   Respiratory: Negative.   Cardiovascular: Negative.     Blood pressure 138/91, pulse 112, temperature 100.2 F (37.9 C), temperature source Oral, resp. rate 20, weight 135 lb (61.236 kg), last menstrual period 03/10/2013, SpO2 100.00%.  Physical Exam Physical Exam  Constitutional: She appears well-developed and well-nourished. No distress.  HENT:  Head: Normocephalic and atraumatic.  Cardiovascular: Normal rate and regular rhythm.   Pulmonary/Chest: Effort normal and breath sounds normal.  No palpable breast masses.  Palpable Port-A-Cath in the right upper chest.  Abdominal: Soft. There is no tenderness.  Musculoskeletal:  No supraclavicular or axillary adenopathy.  Neurological: She is alert.  Skin: Skin is warm and dry.  Psychiatric: She has a normal mood and affect. Her behavior is normal.    Data Reviewed Previous notes.  Assessment    Invasive left breast cancer. She has completed neoadjuvant chemotherapy.     Plan    Bilateral mastectomies, left axillary sentinel lymph node biopsy, Port-A-Cath removal, reconstruction per Dr. Bowers. The procedure and the risks have been discussed with her previously.        Wyatte Dames J 10/30/2013, 7:21 AM    

## 2013-10-31 MED ORDER — MENTHOL 3 MG MT LOZG
1.0000 | LOZENGE | OROMUCOSAL | Status: DC | PRN
  Filled 2013-10-31: qty 9

## 2013-10-31 MED ORDER — HYDROMORPHONE HCL 2 MG PO TABS
2.0000 mg | ORAL_TABLET | ORAL | Status: DC | PRN
  Administered 2013-10-31: 2 mg via ORAL
  Administered 2013-10-31: 4 mg via ORAL
  Administered 2013-10-31: 2 mg via ORAL
  Administered 2013-10-31: 4 mg via ORAL
  Administered 2013-11-01: 2 mg via ORAL
  Administered 2013-11-01 – 2013-11-02 (×5): 4 mg via ORAL
  Filled 2013-10-31 (×2): qty 1
  Filled 2013-10-31 (×6): qty 2
  Filled 2013-10-31: qty 1
  Filled 2013-10-31: qty 2
  Filled 2013-10-31: qty 1
  Filled 2013-10-31: qty 2

## 2013-10-31 MED ORDER — HEPARIN SODIUM (PORCINE) 5000 UNIT/ML IJ SOLN
5000.0000 [IU] | Freq: Three times a day (TID) | INTRAMUSCULAR | Status: DC
  Administered 2013-10-31 – 2013-11-02 (×6): 5000 [IU] via SUBCUTANEOUS
  Filled 2013-10-31 (×8): qty 1

## 2013-10-31 MED ORDER — METOCLOPRAMIDE HCL 5 MG/ML IJ SOLN
10.0000 mg | Freq: Three times a day (TID) | INTRAMUSCULAR | Status: DC
  Administered 2013-10-31 – 2013-11-02 (×7): 10 mg via INTRAVENOUS
  Filled 2013-10-31 (×11): qty 2

## 2013-10-31 NOTE — Discharge Instructions (Addendum)
Follow up appointment with Dr. Zella Richer in 2-3 weeks.  Please call and make this appointment [phone]).No lifting for 6 weeks No vigorous activity for 6 weeks (including outdoor walks) No driving for 4 weeks OK to walk up stairs slowly Stay propped up Use incentive spirometer at home every hour while awake No shower while drains are in place Empty drains at least three times a day and record the amounts separately Change drain dressings every third day if instructed to do so by Dr. Harlow Mares  Apply Bacitracin antibiotic ointment to the drain sites  Place gauze dressing over drains  Secure the gauze with tape Take an over-the-counter Probiotic while on antibiotics Take an over-the-counter stool softener (such as Colace) while on pain medication See Dr. Harlow Mares on Monday in office For questions call [phone] or [phone]

## 2013-10-31 NOTE — Anesthesia Postprocedure Evaluation (Signed)
  Anesthesia Post-op Note  Patient: Brittney Vance  Procedure(s) Performed: Procedure(s): TOTAL MASTECTOMY (Bilateral) AXILLARY LYMPH NODE BIOPSY (Left) REMOVAL PORT-A-CATH (Right) BILATERAL BREAST RECONSTRUCTION WITH PLACEMENT OF TISSUE EXPANDER AND FLEX HD (ACELLULAR HYDRATED DERMIS) (Bilateral)  Patient Location: PACU  Anesthesia Type:General  Level of Consciousness: awake and alert   Airway and Oxygen Therapy: Patient Spontanous Breathing  Post-op Pain: mild  Post-op Assessment: Post-op Vital signs reviewed  Post-op Vital Signs: stable  Last Vitals:  Filed Vitals:   10/31/13 0540  BP: 137/56  Pulse: 97  Temp: 37.3 C  Resp: 15    Complications: No apparent anesthesia complications

## 2013-10-31 NOTE — Op Note (Deleted)
NAME:  Brittney Vance, Brittney Vance NO.:  [id number]  MEDICAL RECORD NO.:  [id number]  LOCATION:                               FACILITY:  [facility]  PHYSICIAN:  [name], M.D.     DATE OF BIRTH:  [birth year]  DATE OF PROCEDURE:  10/30/2013 DATE OF DISCHARGE:  11/02/2013                              OPERATIVE REPORT   PREOPERATIVE DIAGNOSIS:  Breast cancer.  POSTOPERATIVE DIAGNOSIS:  Breast cancer.  PROCEDURE PERFORMED: 1. Bilateral immediate breast reconstruction with tissue expanders. 2. Bilateral chest wall reconstruction with acellular dermal matrix 64     cm2 on the left side and greater than 100 cm2 on the right side for     inadequate chest wall muscle and reconstruction of the inframammary     fold bilateral.  SURGEON:  [name], MD  ANESTHESIA:  General.  ESTIMATED BLOOD LOSS:  30 mL.  DRAINS:  Two 19-French drains on each side.  CLINICAL NOTE:  A 48 year old woman has breast cancer, has had neoadjuvant chemotherapy and now presents for bilateral mastectomy.  She desired reconstruction and this was discussed with her in great detail. She selected the use of tissue expanders as a staged procedure for eventual placement of implants.  A careful discussion involved the possibility of radiation.  She did go to see her radiation oncologist for further counseling regarding this last week and she decided to proceed with her breast reconstruction.  The nature of these procedures and risks and possible complications were discussed with her in detail. These risks include but are not limited to bleeding, infection, healing problems, scarring, loss of sensation, fluid accumulations, anesthesia related complications, pneumothorax, DVT, PE, chronic pain, asymmetry, failure of device, capsular contracture, displacement of device, wrinkles and ripples, loss of skin of the chest wall, overall disappointment, and she understood all of this as well as the possible need for  the use of an acellular dermal matrix for chest wall reconstruction.  She understood that this would be used to supplement muscle and to help recreate the crease underneath the breast.  She understood all of this and wished to proceed.  DESCRIPTION OF PROCEDURE:  The patient was in the operating room and bilateral mastectomy being completed.  The wounds were dressed with antibiotic soaked laps and staple close.  Thorough irrigation was performed with saline and the skin flaps were inspected and found to have excellent color and bright red bleeding at the periphery consistent viability.  On inspection of the chest space, the pectoralis muscles were noted to be inadequate for coverage and the inframammary creases were indistinct.  It was therefore felt that acellular dermal matrix should be used.  FlexHD was used thick and was soaked first in saline for 10 minute, then antibiotic solution for 10 minutes, and then antibiotic solution again for 10 minutes.  Between each soaking, ADM was stripped of any fluid that contained within it and each time operating gloves were thoroughly cleansed.  The expanders were prepared, these were 650 mL Mentor moderate height tissue expanders.  After thoroughly cleaning gloves, these were soaked in antibiotic solution, and 150 mL sterile saline placed using closed filling system.  All the air removed, and the expanders were returned to the antibiotic soak.  Again, the mastectomy space as well as the space deep to the pectoralis major muscles inspected.  The muscles had been lifted in order to allow for placement of the tissue expanders in a combined submuscular position with acellular dermal matrix providing the remainder of the deep coverage.  With the expanders in place after ensuring excellent hemostasis, the ADM was secured.  This was pie crusted thoroughly, both pieces for each side and then the ADM was positioned with the epidermal side facing towards  the tissue expander and the dermal side facing up towards the overlying mastectomy skin flap.  The ADM was sutured with 3- 0 PDS suture and great care was taken to avoid damage to underlying tissue expanders which were kept under direct vision at all times.  A complete coverage of the expanders was achieved using a combination of muscle and acellular dermal matrix.  Again, irrigation with antibiotic solution.  Two 19-French drains were positioned on each side, one medial and one lateral, brought through separate stab wounds inferiorly and secured with 3-0 Prolene sutures.  An additional amount of saline was placed on each side using the closed filling system sterile saline.  The total amount of fill for both sides, 325 mL of sterile saline. Excellent hemostasis had been confirmed.  The skin closure with 3-0 Monocryl interrupted inverted deep dermal sutures and Dermabond.  The drains were secured with 3-0 Prolene sutures and dressed with Biopatch and Tegaderm.  Dry sterile dressings applied and then the breast binder was positioned and she was transferred to the recovery room stable having tolerated the procedure well.  Again, the skin flaps had excellent color at the conclusion of the procedure.     [name], M.D.     DB/MEDQ  D:  10/30/2013  T:  10/31/2013  Job:  [id number]

## 2013-10-31 NOTE — Op Note (Signed)
NAME:  Brittney Vance, Brittney Vance NO.:  [id number]  MEDICAL RECORD NO.:  [id number]  LOCATION:                               FACILITY:  [facility]  PHYSICIAN:  [name], M.D.     DATE OF BIRTH:  [birth year]  DATE OF PROCEDURE:  10/30/2013 DATE OF DISCHARGE:  11/02/2013                              OPERATIVE REPORT   PREOPERATIVE DIAGNOSIS:  Breast cancer.  POSTOPERATIVE DIAGNOSIS:  Breast cancer.  PROCEDURE PERFORMED: 1. Bilateral immediate breast reconstruction with tissue expanders. 2. Bilateral chest wall reconstruction with acellular dermal matrix 64     cm2 on the left side and greater than 100 cm2 on the right side for     inadequate chest wall muscle and reconstruction of the inframammary     fold bilateral.  SURGEON:  [name], MD  ANESTHESIA:  General.  ESTIMATED BLOOD LOSS:  30 mL.  DRAINS:  Two 19-French drains on each side.  CLINICAL NOTE:  A 48 year old woman has breast cancer, has had neoadjuvant chemotherapy and now presents for bilateral mastectomy.  She desired reconstruction and this was discussed with her in great detail. She selected the use of tissue expanders as a staged procedure for eventual placement of implants.  A careful discussion involved the possibility of radiation.  She did go to see her radiation oncologist for further counseling regarding this last week and she decided to proceed with her breast reconstruction.  The nature of these procedures and risks and possible complications were discussed with her in detail. These risks include but are not limited to bleeding, infection, healing problems, scarring, loss of sensation, fluid accumulations, anesthesia related complications, pneumothorax, DVT, PE, chronic pain, asymmetry, failure of device, capsular contracture, displacement of device, wrinkles and ripples, loss of skin of the chest wall, overall disappointment, and she understood all of this as well as the possible need for  the use of an acellular dermal matrix for chest wall reconstruction.  She understood that this would be used to supplement muscle and to help recreate the crease underneath the breast.  She understood all of this and wished to proceed.  DESCRIPTION OF PROCEDURE:  The patient was in the operating room and bilateral mastectomy being completed.  The wounds were dressed with antibiotic soaked laps and staple close.  Thorough irrigation was performed with saline and the skin flaps were inspected and found to have excellent color and bright red bleeding at the periphery consistent viability.  On inspection of the chest space, the pectoralis muscles were noted to be inadequate for coverage and the inframammary creases were indistinct.  It was therefore felt that acellular dermal matrix should be used.  FlexHD was used thick and was soaked first in saline for 10 minute, then antibiotic solution for 10 minutes, and then antibiotic solution again for 10 minutes.  Between each soaking, ADM was stripped of any fluid that contained within it and each time operating gloves were thoroughly cleansed.  The expanders were prepared, these were 650 mL [place] moderate height tissue expanders.  After thoroughly cleaning gloves, these were soaked in antibiotic solution, and 150 mL sterile saline placed using closed filling system.  All the air removed, and the expanders were returned to the antibiotic soak.  Again, the mastectomy space as well as the space deep to the pectoralis major muscles inspected.  The muscles had been lifted in order to allow for placement of the tissue expanders in a combined submuscular position with acellular dermal matrix providing the remainder of the deep coverage.  With the expanders in place after ensuring excellent hemostasis, the ADM was secured.  This was pie crusted thoroughly, both pieces for each side and then the ADM was positioned with the epidermal side facing towards  the tissue expander and the dermal side facing up towards the overlying mastectomy skin flap.  The ADM was sutured with 3- 0 PDS suture and great care was taken to avoid damage to underlying tissue expanders which were kept under direct vision at all times.  A complete coverage of the expanders was achieved using a combination of muscle and acellular dermal matrix.  Again, irrigation with antibiotic solution.  Two 19-French drains were positioned on each side, one medial and one lateral, brought through separate stab wounds inferiorly and secured with 3-0 Prolene sutures.  An additional amount of saline was placed on each side using the closed filling system sterile saline.  The total amount of fill for both sides, 325 mL of sterile saline. Excellent hemostasis had been confirmed.  The skin closure with 3-0 Monocryl interrupted inverted deep dermal sutures and Dermabond.  The drains were secured with 3-0 Prolene sutures and dressed with Biopatch and Tegaderm.  Dry sterile dressings applied and then the breast binder was positioned and she was transferred to the recovery room stable having tolerated the procedure well.  Again, the skin flaps had excellent color at the conclusion of the procedure.     [name], M.D.     DB/MEDQ  D:  10/30/2013  T:  10/31/2013  Job:  [id number]

## 2013-10-31 NOTE — Progress Notes (Signed)
1 Day Post-Op  Subjective: Adequate pain control.    Objective: Vital signs in last 24 hours: Temp:  [97.8 F (36.6 C)-99.1 F (37.3 C)] 99.1 F (37.3 C) (04/08 0540) Pulse Rate:  [97-107] 97 (04/08 0540) Resp:  [10-32] 15 (04/08 0540) BP: (125-161)/(56-91) 137/56 mmHg (04/08 0540) SpO2:  [99 %-100 %] 100 % (04/08 0540)    Intake/Output from previous day: 04/07 0701 - 04/08 0700 In: 3320 [P.O.:120; I.V.:3200] Out: 2508 [Urine:2000; Drains:358; Blood:150] Intake/Output this shift:    PE: General- In NAD Chest-incisions intact, flaps viable  Lab Results:  No results found for this basename: WBC, HGB, HCT, PLT,  in the last 72 hours BMET No results found for this basename: NA, K, CL, CO2, GLUCOSE, BUN, CREATININE, CALCIUM,  in the last 72 hours PT/INR No results found for this basename: LABPROT, INR,  in the last 72 hours Comprehensive Metabolic Panel:    Component Value Date/Time   NA 143 10/22/2013 1019   NA 142 10/03/2013 0952   NA 140 09/17/2013 0902   NA 136 04/11/2013 0927   K 4.1 10/22/2013 1019   K 3.4* 10/03/2013 0952   K 3.0* 09/17/2013 0902   K 3.9 04/11/2013 0927   CL 102 10/22/2013 1019   CL 103 04/11/2013 0927   CO2 27 10/22/2013 1019   CO2 26 10/03/2013 0952   CO2 28 09/17/2013 0902   CO2 29 04/11/2013 0927   BUN 7 10/22/2013 1019   BUN 6.9* 10/03/2013 0952   BUN 9.3 09/17/2013 0902   BUN 10 04/11/2013 0927   CREATININE 0.51 10/22/2013 1019   CREATININE 0.6 10/03/2013 0952   CREATININE 0.7 09/17/2013 0902   CREATININE 0.67 04/11/2013 0927   CREATININE 0.64 01/19/2013 0910   CREATININE 0.65 05/21/2009 2243   GLUCOSE 110* 10/22/2013 1019   GLUCOSE 95 10/03/2013 0952   GLUCOSE 92 09/17/2013 0902   GLUCOSE 83 04/11/2013 0927   CALCIUM 9.9 10/22/2013 1019   CALCIUM 9.7 10/03/2013 0952   CALCIUM 10.0 09/17/2013 0902   CALCIUM 9.5 04/11/2013 0927   AST 30 10/22/2013 1019   AST 37* 10/03/2013 0952   AST 27 09/17/2013 0902   AST 21 01/19/2013 0910   ALT 30 10/22/2013 1019   ALT 49 10/03/2013 0952   ALT 30 09/17/2013 0902   ALT 20 01/19/2013 0910   ALKPHOS 81 10/22/2013 1019   ALKPHOS 96 10/03/2013 0952   ALKPHOS 95 09/17/2013 0902   ALKPHOS 70 01/19/2013 0910   BILITOT 0.3 10/22/2013 1019   BILITOT 0.38 10/03/2013 0952   BILITOT 0.27 09/17/2013 0902   BILITOT 0.4 01/19/2013 0910   PROT 7.0 10/22/2013 1019   PROT 6.9 10/03/2013 0952   PROT 7.2 09/17/2013 0902   PROT 7.3 01/19/2013 0910   ALBUMIN 3.8 10/22/2013 1019   ALBUMIN 4.0 10/03/2013 0952   ALBUMIN 4.2 09/17/2013 0902   ALBUMIN 4.2 01/19/2013 0910     Studies/Results: Nm Sentinel Node Inj-no Rpt (breast)  10/30/2013   CLINICAL DATA: right breast cancer   Sulfur colloid was injected intradermally by the nuclear medicine  technologist for breast cancer sentinel node localization.     Anti-infectives: Anti-infectives   Start     Dose/Rate Route Frequency Ordered Stop   10/30/13 1800  ceFAZolin (ANCEF) IVPB 1 g/50 mL premix    Comments:  Continue while she is in the hospital due to implanted devices for breast reconstruction and use of ADM   1 g 100 mL/hr over 30 Minutes Intravenous 4  times per day 10/30/13 1637     10/30/13 1124  bacitracin 50,000 Units, gentamicin (GARAMYCIN) 80 mg, ceFAZolin (ANCEF) 1 g in sodium chloride 0.9 % 1,000 mL  Status:  Discontinued       As needed 10/30/13 1125 10/30/13 1344   10/30/13 1115  bacitracin 50,000 Units, gentamicin (GARAMYCIN) 80 mg, ceFAZolin (ANCEF) 1 g in sodium chloride 0.9 % 1,000 mL  Status:  Discontinued      Irrigation To Surgery 10/30/13 1107 10/30/13 1625   10/30/13 1045  ceFAZolin (ANCEF) IVPB 1 g/50 mL premix  Status:  Discontinued     1 g 100 mL/hr over 30 Minutes Intravenous To Surgery 10/30/13 1037 10/30/13 1625   10/30/13 0700  bacitracin 50,000 Units, gentamicin (GARAMYCIN) 80 mg, ceFAZolin (ANCEF) 1 g in sodium chloride 0.9 % 1,000 mL  Status:  Discontinued      Irrigation Once 10/29/13 0708 10/30/13 1637   10/30/13 0624  ceFAZolin (ANCEF) IVPB 2 g/50  mL premix  Status:  Discontinued     2 g 100 mL/hr over 30 Minutes Intravenous On call to O.R. 10/30/13 2956 10/30/13 1625   10/30/13 0600  ceFAZolin (ANCEF) IVPB 2 g/50 mL premix     2 g 100 mL/hr over 30 Minutes Intravenous On call to O.R. 10/29/13 1410 10/30/13 1136      Assessment Principal Problem:   Invasive left breast cancer s/p bilateral mastectomies, Port-a-cath removal, left axillary SLNBx 10/30/13-stable overnight    LOS: 1 day   Plan: Mobilize.   Rhunette Croft Amaree Leeper 10/31/2013

## 2013-10-31 NOTE — Progress Notes (Signed)
Subjective: Sore. Some nausea. Good pain control  Objective: Vital signs in last 24 hours: Temp:  [97.8 F (36.6 C)-99.1 F (37.3 C)] 99.1 F (37.3 C) (04/08 0540) Pulse Rate:  [97-107] 97 (04/08 0540) Resp:  [10-32] 15 (04/08 0540) BP: (125-161)/(56-91) 137/56 mmHg (04/08 0540) SpO2:  [99 %-100 %] 100 % (04/08 0540)  Intake/Output from previous day: 04/07 0701 - 04/08 0700 In: 3320 [P.O.:120; I.V.:3200] Out: 2508 [Urine:2000; Drains:358; Blood:150] Intake/Output this shift:    Operative sites: Mastectomy flaps viable. Tissue expanders appear in good position. Drains functioning. Drainage thin. No evidence of bleeding or infection.  No results found for this basename: WBC, HGB, HCT, PLATELETS, NA, K, CL, CO2, BUN, CREATININE, GLU,  in the last 72 hours  Studies/Results: Nm Sentinel Node Inj-no Rpt (breast)  10/30/2013   CLINICAL DATA: right breast cancer   Sulfur colloid was injected intradermally by the nuclear medicine  technologist for breast cancer sentinel node localization.     Assessment/Plan: Ambulate. Spirometry! (She was not given one yesterday). PO pain med. Reglan may help nausea. DVT prophylaxis.   LOS: 1 day    Brittney Vance 10/31/2013 7:37 AM

## 2013-11-01 ENCOUNTER — Encounter (HOSPITAL_COMMUNITY): Payer: Self-pay | Admitting: General Surgery

## 2013-11-01 NOTE — Progress Notes (Signed)
Utilization review completed. Arvid Marengo, RN, BSN. 

## 2013-11-01 NOTE — Progress Notes (Signed)
2 Days Post-Op  Subjective: Having some issues with pain control.  Nausea better.  Walking.    Objective: Vital signs in last 24 hours: Temp:  [98.6 F (37 C)-102 F (38.9 C)] 98.6 F (37 C) (04/09 3419) Pulse Rate:  [102-116] 105 (04/09 0637) Resp:  [15-18] 16 (04/09 0637) BP: (107-156)/(50-73) 107/50 mmHg (04/09 0637) SpO2:  [95 %-100 %] 95 % (04/09 0637) Weight:  [134 lb 3.2 oz (60.873 kg)] 134 lb 3.2 oz (60.873 kg) (04/08 1303) Last BM Date: 10/29/13  Intake/Output from previous day: 04/08 0701 - 04/09 0700 In: 2410 [P.O.:180; I.V.:1940; IV Piggyback:150] Out: 920 [Urine:750; Drains:170] Intake/Output this shift:    PE: General- In NAD Chest-incisions intact, flaps viable  Lab Results:  No results found for this basename: WBC, HGB, HCT, PLT,  in the last 72 hours BMET No results found for this basename: NA, K, CL, CO2, GLUCOSE, BUN, CREATININE, CALCIUM,  in the last 72 hours PT/INR No results found for this basename: LABPROT, INR,  in the last 72 hours Comprehensive Metabolic Panel:    Component Value Date/Time   NA 143 10/22/2013 1019   NA 142 10/03/2013 0952   NA 140 09/17/2013 0902   NA 136 04/11/2013 0927   K 4.1 10/22/2013 1019   K 3.4* 10/03/2013 0952   K 3.0* 09/17/2013 0902   K 3.9 04/11/2013 0927   CL 102 10/22/2013 1019   CL 103 04/11/2013 0927   CO2 27 10/22/2013 1019   CO2 26 10/03/2013 0952   CO2 28 09/17/2013 0902   CO2 29 04/11/2013 0927   BUN 7 10/22/2013 1019   BUN 6.9* 10/03/2013 0952   BUN 9.3 09/17/2013 0902   BUN 10 04/11/2013 0927   CREATININE 0.51 10/22/2013 1019   CREATININE 0.6 10/03/2013 0952   CREATININE 0.7 09/17/2013 0902   CREATININE 0.67 04/11/2013 0927   CREATININE 0.64 01/19/2013 0910   CREATININE 0.65 05/21/2009 2243   GLUCOSE 110* 10/22/2013 1019   GLUCOSE 95 10/03/2013 0952   GLUCOSE 92 09/17/2013 0902   GLUCOSE 83 04/11/2013 0927   CALCIUM 9.9 10/22/2013 1019   CALCIUM 9.7 10/03/2013 0952   CALCIUM 10.0 09/17/2013 0902   CALCIUM 9.5  04/11/2013 0927   AST 30 10/22/2013 1019   AST 37* 10/03/2013 0952   AST 27 09/17/2013 0902   AST 21 01/19/2013 0910   ALT 30 10/22/2013 1019   ALT 49 10/03/2013 0952   ALT 30 09/17/2013 0902   ALT 20 01/19/2013 0910   ALKPHOS 81 10/22/2013 1019   ALKPHOS 96 10/03/2013 0952   ALKPHOS 95 09/17/2013 0902   ALKPHOS 70 01/19/2013 0910   BILITOT 0.3 10/22/2013 1019   BILITOT 0.38 10/03/2013 0952   BILITOT 0.27 09/17/2013 0902   BILITOT 0.4 01/19/2013 0910   PROT 7.0 10/22/2013 1019   PROT 6.9 10/03/2013 0952   PROT 7.2 09/17/2013 0902   PROT 7.3 01/19/2013 0910   ALBUMIN 3.8 10/22/2013 1019   ALBUMIN 4.0 10/03/2013 0952   ALBUMIN 4.2 09/17/2013 0902   ALBUMIN 4.2 01/19/2013 0910     Studies/Results: No results found.  Anti-infectives: Anti-infectives   Start     Dose/Rate Route Frequency Ordered Stop   10/30/13 1800  ceFAZolin (ANCEF) IVPB 1 g/50 mL premix    Comments:  Continue while she is in the hospital due to implanted devices for breast reconstruction and use of ADM   1 g 100 mL/hr over 30 Minutes Intravenous 4 times per day 10/30/13 1637  10/30/13 1124  bacitracin 50,000 Units, gentamicin (GARAMYCIN) 80 mg, ceFAZolin (ANCEF) 1 g in sodium chloride 0.9 % 1,000 mL  Status:  Discontinued       As needed 10/30/13 1125 10/30/13 1344   10/30/13 1115  bacitracin 50,000 Units, gentamicin (GARAMYCIN) 80 mg, ceFAZolin (ANCEF) 1 g in sodium chloride 0.9 % 1,000 mL  Status:  Discontinued      Irrigation To Surgery 10/30/13 1107 10/30/13 1625   10/30/13 1045  ceFAZolin (ANCEF) IVPB 1 g/50 mL premix  Status:  Discontinued     1 g 100 mL/hr over 30 Minutes Intravenous To Surgery 10/30/13 1037 10/30/13 1625   10/30/13 0700  bacitracin 50,000 Units, gentamicin (GARAMYCIN) 80 mg, ceFAZolin (ANCEF) 1 g in sodium chloride 0.9 % 1,000 mL  Status:  Discontinued      Irrigation Once 10/29/13 0708 10/30/13 1637   10/30/13 0624  ceFAZolin (ANCEF) IVPB 2 g/50 mL premix  Status:  Discontinued     2 g 100 mL/hr  over 30 Minutes Intravenous On call to O.R. 10/30/13 6834 10/30/13 1625   10/30/13 0600  ceFAZolin (ANCEF) IVPB 2 g/50 mL premix     2 g 100 mL/hr over 30 Minutes Intravenous On call to O.R. 10/29/13 1410 10/30/13 1136      Assessment Principal Problem:   Invasive left breast cancer s/p bilateral mastectomies, Port-a-cath removal, left axillary SLNBx 10/30/13-slowly feeling better; pathology demonstrates no residual cancer in left breast and lymph nodes are negative for malignancy-we discussed this.  LOS: 2 days   Plan: Home when okay from Dr. Harlow Mares standpoint   Brittney Vance 11/01/2013

## 2013-11-01 NOTE — Progress Notes (Signed)
Subjective: Continues to have some nausea with difficulty tolerating diet and difficulty ambulating  Objective: Vital signs in last 24 hours: Temp:  [98.6 F (37 C)-102 F (38.9 C)] 99.3 F (37.4 C) (04/09 1038) Pulse Rate:  [102-116] 109 (04/09 1038) Resp:  [16-18] 16 (04/09 1038) BP: (107-155)/(50-73) 139/68 mmHg (04/09 1038) SpO2:  [95 %-100 %] 99 % (04/09 1038) Weight:  [134 lb 3.2 oz (60.873 kg)] 134 lb 3.2 oz (60.873 kg) (04/08 1303)  Intake/Output from previous day: 04/08 0701 - 04/09 0700 In: 2410 [P.O.:180; I.V.:1940; IV Piggyback:150] Out: 920 [Urine:750; Drains:170] Intake/Output this shift:    Operative sites: Mastectomy flaps viable. Tissue expanders appear to be in good position. Drains functioning. Drainage thin. There is no evidence of bleeding or infection at either site.  No results found for this basename: WBC, HGB, HCT, PLATELETS, NA, K, CL, CO2, BUN, CREATININE, GLU,  in the last 72 hours  Studies/Results: No results found.  Assessment/Plan: Not yet ready for discharge due to failure to tolerate po and difficulty ambulating. Will need one more day.   LOS: 2 days    Brittney Vance 11/01/2013 11:58 AM

## 2013-11-02 MED ORDER — ENOXAPARIN SODIUM 40 MG/0.4ML ~~LOC~~ SOLN: 40.0000 mg | SUBCUTANEOUS | Status: DC

## 2013-11-02 MED ORDER — ENOXAPARIN SODIUM 40 MG/0.4ML ~~LOC~~ SOLN
40.0000 mg | SUBCUTANEOUS | Status: DC
Start: 2013-11-02 — End: 2013-11-02

## 2013-11-02 MED ORDER — HYDROMORPHONE HCL 2 MG PO TABS: 2.0000 mg | ORAL_TABLET | Freq: Four times a day (QID) | ORAL | Status: DC | PRN

## 2013-11-02 MED ORDER — DSS 100 MG PO CAPS: 100.0000 mg | ORAL_CAPSULE | Freq: Every day | ORAL | Status: DC

## 2013-11-02 MED ORDER — METHOCARBAMOL 500 MG PO TABS: 500.0000 mg | ORAL_TABLET | Freq: Four times a day (QID) | ORAL | Status: DC | PRN

## 2013-11-02 MED ORDER — CEPHALEXIN 500 MG PO CAPS: 500.0000 mg | ORAL_CAPSULE | Freq: Two times a day (BID) | ORAL | Status: DC

## 2013-11-02 NOTE — Discharge Summary (Signed)
Physician Discharge Summary  Patient ID: Brittney Vance MRN: [id number] DOB/AGE: 27-Feb-1966 47 y.o.  Admit date: 10/30/2013 Discharge date: 11/02/2013  Admission Diagnoses: Breast cancer  Discharge Diagnoses: Same  Principal Problem:   Invasive left breast cancer s/p bilateral mastectomies, Port-a-cath removal, left axillary SLNBx 10/30/13   Discharged Condition: good  Hospital Course: On the day of admission the patient was taken to surgery and had bilateral mastectomy and reconstruction with tissue expanders and Flex HD. The patient tolerated the procedures well. Postoperatively, the flap maintained excellent color and capillary refill. The patient was having some difficulty with ambulation and tolerating diet on the first postoperative day. She made gradual progress. DVT prophylaxis continued. By the third day she was tolerating diet and ambulating well. Pain under good control.  Treatments: antibiotics: Ancef, anticoagulation: heparin and surgery: bilateral mastectomy, left sentinel node, port removal, bilateral breast reconstruction with tissue expanders and Flex HD.  Discharge Exam: Blood pressure 125/60, pulse 100, temperature 99.7 F (37.6 C), temperature source Oral, resp. rate 18, height 5\' 2"  (1.575 m), weight 134 lb 3.2 oz (60.873 kg), last menstrual period 03/10/2013, SpO2 100.00%.  Operative sites: Mastectomy flaps viable. Tissue expanders positioned well. Drains functioning. Drainage thin.  There is no evidence of bleeding or infection either side.  Disposition: 01-Home or Self Care   Future Appointments Provider Department Dept Phone   11/16/2013 9:30 AM Hali Marry, MD [place] [facility] [place] [phone]   11/23/2013 9:30 AM [facility] [phone]   11/23/2013 10:00 AM Chauncey Cruel, [facility] [phone]       Medication List    STOP taking these  medications       HYDROcodone-acetaminophen 5-325 MG per tablet  Commonly known as:  NORCO/VICODIN     MULTIVITAMIN PO      TAKE these medications       cephALEXin 500 MG capsule  Commonly known as:  KEFLEX  Take 1 capsule (500 mg total) by mouth every 12 (twelve) hours.     DSS 100 MG Caps  Take 100 mg by mouth daily.     enoxaparin 40 MG/0.4ML injection  Commonly known as:  LOVENOX  Inject 0.4 mLs (40 mg total) into the skin daily.     hydrochlorothiazide 25 MG tablet  Commonly known as:  HYDRODIURIL  Take 25 mg by mouth daily after lunch.     HYDROmorphone 2 MG tablet  Commonly known as:  DILAUDID  Take 1-2 tablets (2-4 mg total) by mouth every 6 (six) hours as needed for severe pain.     levothyroxine 88 MCG tablet  Commonly known as:  SYNTHROID, LEVOTHROID  Take 88 mcg by mouth daily before breakfast.     lidocaine-prilocaine cream  Commonly known as:  EMLA  Apply topically as needed. 1-2 hrs before each procedure     methocarbamol 500 MG tablet  Commonly known as:  ROBAXIN  Take 1 tablet (500 mg total) by mouth every 6 (six) hours as needed for muscle spasms.     tobramycin-dexamethasone ophthalmic solution  Commonly known as:  TOBRADEX  Place 1 drop into both eyes 2 (two) times daily.         Signed: [name] 11/02/2013, 12:06 PM

## 2013-11-02 NOTE — Discharge Planning (Signed)
At 1140 this am, patient complaining of pain to chest mastectomy sites. 4 mg Dilaudid by mouth given with good pain relief. At 1415, Discharge instructions given to patient and patient's niece. Teaching with demonstration and verbal instructions given for homegoing administration of Lovenox. Care of JP drains discussed. Demonstration of care of JP drains done with return demonstration by patient's niece.  Showed measurement of drainage and documentation to be done at home. IV discontinued without incident. Tonita Cong, RN

## 2013-11-06 ENCOUNTER — Encounter: Payer: Self-pay | Admitting: *Deleted

## 2013-11-06 NOTE — Progress Notes (Signed)
[place] Psychosocial Distress Screening Clinical Social Work  Clinical Social Work was referred by distress screening protocol.  The patient scored a 6 on the Psychosocial Distress Thermometer which indicates moderate distress. Clinical Social Worker contacted pt at home to assess for distress and other psychosocial needs.  Pt expressed concerns after here SSD had been denied.  Pt also expressed concern for her children's transportation to school.  CSW and pt discussed other financial resources, and pt stated she had previously used the financial assistance offered by [facility].  CSW informed pt on the Patient [facility]) and encouraged her to contact them, as they may be aware of other resources she may qualify for.  CSW also encouraged pt to call her children's school to see if they were familiar with additional resources for her children.  CSW offered additional support and encouraged pt to call with questions or concerns.     Johnnye Lana, MSW, [place] Worker [facility] [phone]

## 2013-11-08 NOTE — Addendum Note (Signed)
Addendum created 11/08/13 0720 by Albertha Ghee, MD   Modules edited: Anesthesia Attestations

## 2013-11-16 ENCOUNTER — Ambulatory Visit: Payer: BC Managed Care – PPO | Admitting: Family Medicine

## 2013-11-17 ENCOUNTER — Other Ambulatory Visit: Payer: Self-pay | Admitting: Family Medicine

## 2013-11-21 ENCOUNTER — Ambulatory Visit: Payer: BC Managed Care – PPO | Admitting: Radiation Oncology

## 2013-11-21 ENCOUNTER — Institutional Professional Consult (permissible substitution): Payer: BC Managed Care – PPO | Admitting: Radiation Oncology

## 2013-11-21 ENCOUNTER — Ambulatory Visit: Payer: BC Managed Care – PPO

## 2013-11-22 ENCOUNTER — Institutional Professional Consult (permissible substitution): Payer: BC Managed Care – PPO | Admitting: Radiation Oncology

## 2013-11-22 ENCOUNTER — Other Ambulatory Visit: Payer: Self-pay | Admitting: *Deleted

## 2013-11-22 DIAGNOSIS — C50412 Malignant neoplasm of upper-outer quadrant of left female breast: Secondary | ICD-10-CM

## 2013-11-23 ENCOUNTER — Other Ambulatory Visit (HOSPITAL_BASED_OUTPATIENT_CLINIC_OR_DEPARTMENT_OTHER): Payer: BC Managed Care – PPO

## 2013-11-23 ENCOUNTER — Telehealth: Payer: Self-pay | Admitting: Oncology

## 2013-11-23 ENCOUNTER — Ambulatory Visit (HOSPITAL_BASED_OUTPATIENT_CLINIC_OR_DEPARTMENT_OTHER): Payer: BC Managed Care – PPO | Admitting: Oncology

## 2013-11-23 VITALS — BP 124/85 | HR 98 | Temp 98.4°F | Resp 20 | Ht 62.0 in | Wt 128.0 lb

## 2013-11-23 DIAGNOSIS — C50419 Malignant neoplasm of upper-outer quadrant of unspecified female breast: Secondary | ICD-10-CM

## 2013-11-23 DIAGNOSIS — C50919 Malignant neoplasm of unspecified site of unspecified female breast: Secondary | ICD-10-CM

## 2013-11-23 DIAGNOSIS — C50412 Malignant neoplasm of upper-outer quadrant of left female breast: Secondary | ICD-10-CM

## 2013-11-23 DIAGNOSIS — Z171 Estrogen receptor negative status [ER-]: Secondary | ICD-10-CM

## 2013-11-23 LAB — COMPREHENSIVE METABOLIC PANEL (CC13)
ALT: 21 U/L (ref 0–55)
AST: 25 U/L (ref 5–34)
Albumin: 3.8 g/dL (ref 3.5–5.0)
Alkaline Phosphatase: 72 U/L (ref 40–150)
Anion Gap: 13 mEq/L — ABNORMAL HIGH (ref 3–11)
BUN: 9.8 mg/dL (ref 7.0–26.0)
CO2: 25 mEq/L (ref 22–29)
Calcium: 10.4 mg/dL (ref 8.4–10.4)
Chloride: 105 mEq/L (ref 98–109)
Creatinine: 0.7 mg/dL (ref 0.6–1.1)
Glucose: 98 mg/dl (ref 70–140)
Potassium: 3.6 mEq/L (ref 3.5–5.1)
Sodium: 143 mEq/L (ref 136–145)
Total Bilirubin: 0.37 mg/dL (ref 0.20–1.20)
Total Protein: 7.4 g/dL (ref 6.4–8.3)

## 2013-11-23 LAB — CBC WITH DIFFERENTIAL/PLATELET
BASO%: 0.6 % (ref 0.0–2.0)
Basophils Absolute: 0 10*3/uL (ref 0.0–0.1)
EOS%: 7.5 % — ABNORMAL HIGH (ref 0.0–7.0)
Eosinophils Absolute: 0.3 10*3/uL (ref 0.0–0.5)
HCT: 32.2 % — ABNORMAL LOW (ref 34.8–46.6)
HGB: 10.8 g/dL — ABNORMAL LOW (ref 11.6–15.9)
LYMPH%: 44.6 % (ref 14.0–49.7)
MCH: 32.1 pg (ref 25.1–34.0)
MCHC: 33.5 g/dL (ref 31.5–36.0)
MCV: 95.9 fL (ref 79.5–101.0)
MONO#: 0.4 10*3/uL (ref 0.1–0.9)
MONO%: 10.6 % (ref 0.0–14.0)
NEUT#: 1.2 10*3/uL — ABNORMAL LOW (ref 1.5–6.5)
NEUT%: 36.7 % — ABNORMAL LOW (ref 38.4–76.8)
Platelets: 298 10*3/uL (ref 145–400)
RBC: 3.36 10*6/uL — ABNORMAL LOW (ref 3.70–5.45)
RDW: 13.5 % (ref 11.2–14.5)
WBC: 3.4 10*3/uL — ABNORMAL LOW (ref 3.9–10.3)
lymph#: 1.5 10*3/uL (ref 0.9–3.3)

## 2013-11-23 NOTE — Progress Notes (Signed)
ID: Brittney Vance OB: [birth year]  MR#: [id number]  INO#:[id number]  PCP: Beatrice Lecher, MD GYN:  Veneda Melter SU: [name]; [name] OTHER MD: [name]  CHIEF COMPLAINT:  Left Breast Cancer, neoadjuvant chemotherapy  HISTORY OF PRESENT ILLNESS: Brittney Vance had routine screening mammography 03/09/2013 showing heterogeneously dense breasts, and a possible asymmetry in the left breast. Diagnostic left mammography and ultrasonography at the [facility] Center 03/30/2013 found a lobulated mass in the left breast upper outer quadrant measuring 1.6 cm. This was palpable. Ultrasound showed a complex microlobulated hypoechoic mass measuring 1.8 cm. There was no left axillary lymphadenopathy noted.  Biopsy of the mass in question 04/06/2013 showed ([id number]) and invasive ductal carcinoma, grade 3, triple negative, with an MIB-1 of 65%.  Bilateral breast MRI 04/13/2013 found an irregular mass measuring 2.5 cm in the left breast with a few lymph nodes that showed moderate cortical thickness, the largest measuring 12 mm (level I).  The patient's subsequent history is as detailed below  INTERVAL HISTORY: Brittney Vance returns today for followup of her left-sided breast cancer. Since her last visit here she underwent bilateral mastectomies with left sentinel lymph node sampling 10/30/2013. The final pathology (S. Z. [id number]) showed a complete pathologic response in the left breast, with both sentinel lymph nodes clear. The right side was benign. She had immediate expander placement.   REVIEW OF SYSTEMS: Brittney Vance is still recovering from her surgery. She feels sore, and she feels like she has "a break in my left armpit". She needs help to sit up. She does not like the reconstructed breast so far. "It will take getting used to". Aside from that she denies unusual headaches, visual changes, cough, phlegm production, pleurisy, change in bowel or bladder habits, or any symptoms suggestive of recurrent or  metastatic disease. She never developed peripheral neuropathy involving her toes. She had some involving her fingers, but that is "80% better". A detailed review of systems today was otherwise noncontributory   PAST MEDICAL HISTORY: Past Medical History  Diagnosis Date  . Fibroids   . Hypertension   . Hypothyroid   . Breast cancer 2015    left, triple negative     PAST SURGICAL HISTORY: Past Surgical History  Procedure Laterality Date  . Tubal ligation    . Portacath placement Right 04/27/2013    Procedure: ULTRASOUND GUIDED PORT INSERTION WITH FLUOROSCOPY;  Surgeon: [name], MD;  Location: [facility];  Service: General;  Laterality: Right;  . Breast biopsy Left 04/24/2013  . Total mastectomy Bilateral 10/30/2013    Procedure: TOTAL MASTECTOMY;  Surgeon: [name], MD;  Location: [facility];  Service: General;  Laterality: Bilateral;  . Axillary lymph node biopsy Left 10/30/2013    Procedure: AXILLARY LYMPH NODE BIOPSY;  Surgeon: [name], MD;  Location: [place];  Service: General;  Laterality: Left;  . Port-a-cath removal Right 10/30/2013    Procedure: REMOVAL PORT-A-CATH;  Surgeon: [name], MD;  Location: [place];  Service: General;  Laterality: Right;  . Breast reconstruction with placement of tissue expander and flex hd (acellular hydrated dermis) Bilateral 10/30/2013    Procedure: BILATERAL BREAST RECONSTRUCTION WITH PLACEMENT OF TISSUE EXPANDER AND FLEX HD (ACELLULAR HYDRATED DERMIS);  Surgeon: [name], MD;  Location: [facility] [facility];  Service: Plastics;  Laterality: Bilateral;    FAMILY HISTORY Family History  Problem Relation Age of Onset  . Hypertension Mother   . Diabetes Mother   . Alzheimer's disease Mother   . Hypertension Father   .  Diabetes Father   . Hypertension Brother   . Epilepsy Son 4    being worked up for autism   the patient's father died in his 42A from complications of diabetes. The patient's mother is living, in her mid 36s. The patient had  2 brothers, and 2 sisters. There is no history of cancer in the family to her knowledge.  GYNECOLOGIC HISTORY:    (updated 10/03/2013)  Menarche age 62, first live birth age 64. The patient is GX P2. She is postmenopausal, but did have some perimenopausal bleeding, which was evaluated age 69 08/14/2012 with a transabdominal and transvaginal ultrasound of the pelvis which found a normal uterine myometrium and left ovary. The right ovary could not be visualized.  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 She's currently out of work on disability due to her breast cancer and chemotherapy. Her husband [name] also works for the [employer] system. Their children are [name], [place], 5. Efrata's mother currently lives in the home as well. The patient attends a [employer].   ADVANCED DIRECTIVES: Not in place   HEALTH MAINTENANCE: (Updated 10/03/2013) History  Substance Use Topics  . Smoking status: Never Smoker   . Smokeless tobacco: Never Used  . Alcohol Use: 1.2 oz/week    2 Glasses of wine per week     Comment: occassion     Colonoscopy:- Never  PAP: 2013, Dr. [name]  Bone density: Never  Lipid panel: Dr. [name]    Allergies  Allergen Reactions  . Chocolate Itching  . Latex Itching  . Shrimp [Shellfish Allergy] Itching  . Wheat Bran Itching    Current Outpatient Prescriptions  Medication Sig Dispense Refill  . cephALEXin (KEFLEX) 500 MG capsule Take 1 capsule (500 mg total) by mouth every 12 (twelve) hours.  20 capsule  0  . docusate sodium 100 MG CAPS Take 100 mg by mouth daily.  10 capsule  0  . enoxaparin (LOVENOX) 40 MG/0.4ML injection Inject 0.4 mLs (40 mg total) into the skin daily.  10 Syringe  0  . hydrochlorothiazide (HYDRODIURIL) 25 MG tablet Take 25 mg by mouth daily after lunch.      . hydrochlorothiazide (HYDRODIURIL) 25 MG tablet TAKE 1 TABLET (25 MG TOTAL) BY MOUTH DAILY.  30  tablet  0  . HYDROmorphone (DILAUDID) 2 MG tablet Take 1-2 tablets (2-4 mg total) by mouth every 6 (six) hours as needed for severe pain.  40 tablet  0  . levothyroxine (SYNTHROID, LEVOTHROID) 88 MCG tablet Take 88 mcg by mouth daily before breakfast.      . lidocaine-prilocaine (EMLA) cream Apply topically as needed. 1-2 hrs before each procedure  30 g  2  . methocarbamol (ROBAXIN) 500 MG tablet Take 1 tablet (500 mg total) by mouth every 6 (six) hours as needed for muscle spasms.  40 tablet  1  . tobramycin-dexamethasone (TOBRADEX) ophthalmic solution Place 1 drop into both eyes 2 (two) times daily.  5 mL  0   No current facility-administered medications for this visit.   PHYSICAL EXAM: Middle-aged [place] American woman who appears stated age 48 Vitals:   11/23/13 0957  BP: 124/85  Pulse: 98  Temp: 98.4 F (36.9 C)  Resp: 20  Body mass index is 23.41 kg/(m^2).  ECOG: 1 Filed Weights   11/23/13 0957  Weight: 128 lb (58.06 kg)   Sclerae unicteric, pupils equal and reactive Oropharynx clear  and moist No cervical or supraclavicular adenopathy Lungs no rales or rhonchi Heart regular rate and rhythm Abd soft, nontender, positive bowel sounds MSK no focal spinal tenderness, no upper extremity lymphedema Neuro: nonfocal, well oriented, depressed affect Breasts: Status post bilateral mastectomies with expander placement. I think she has a very good cosmetic results so far. Of course it will be better once the expanders have been completely inflated and she has the final implants in place. There is no evidence of residual or recurrent disease. The left axilla is benign.     LAB RESULTS:  Lab Results  Component Value Date   WBC 3.4* 11/23/2013   NEUTROABS 1.2* 11/23/2013   HGB 10.8* 11/23/2013   HCT 32.2* 11/23/2013   MCV 95.9 11/23/2013   PLT 298 11/23/2013      Chemistry      Component Value Date/Time   NA 143 10/22/2013 1019   NA 142 10/03/2013 0952   K 4.1 10/22/2013 1019   K  3.4* 10/03/2013 0952   CL 102 10/22/2013 1019   CO2 27 10/22/2013 1019   CO2 26 10/03/2013 0952   BUN 7 10/22/2013 1019   BUN 6.9* 10/03/2013 0952   CREATININE 0.51 10/22/2013 1019   CREATININE 0.6 10/03/2013 0952   CREATININE 0.67 04/11/2013 0927      Component Value Date/Time   CALCIUM 9.9 10/22/2013 1019   CALCIUM 9.7 10/03/2013 0952   ALKPHOS 81 10/22/2013 1019   ALKPHOS 96 10/03/2013 0952   AST 30 10/22/2013 1019   AST 37* 10/03/2013 0952   ALT 30 10/22/2013 1019   ALT 49 10/03/2013 0952   BILITOT 0.3 10/22/2013 1019   BILITOT 0.38 10/03/2013 0952        STUDIES: Nm Sentinel Node Inj-no Rpt (breast)  10/30/2013   CLINICAL DATA: right breast cancer   Sulfur colloid was injected intradermally by the nuclear medicine  technologist for breast cancer sentinel node localization.      ASSESSMENT: 48 y.o. BRCA negative [place] woman   (1)  status post left breast upper outer quadrant biopsy 04/06/2013 for a clinical T2 N1, stage IIB invasive ductal carcinoma, high-grade, triple negative, with an MIB-1 of 65%  (2) left axillary lymph node biopsy 04/24/2013 negative for malignancy  (3)  treated in the neoadjuvant setting:   (a) completed 4 dose dense cycles of doxorubicin/ cyclophosphamide 06/11/2013,.  (b)  completed 12 weekly cycles of carboplatin/ paclitaxel 09/24/2013  (4)  status post bilateral mastectomies 10/30/2013, showing benign results on the right and a complete pathologic response in the left, including both sentinel lymph nodes clear  PLAN: Has had the best possible response to her chemotherapy. She understands patient's who achieve a complete pathologic response, as she has, however good long-term prognosis. From that point of view the plan will be for [place] to see her every 3 months alternating with her surgeon for the next 2 years and then at longer intervals.  She does not feel satisfied with her reconstruction, but I have reassured her that at least from my point of view this  is a very good cosmetic result. I think that she lives longer with her expanders, and as she finally gets the implants in July, hopefully she will become more used to this and you look.  I have encouraged her to participate in the "finding your new normal" class, which is being set up for I believe September. Otherwise she knows to call for any problems that may develop before her next visit  here.     Chauncey Cruel, MD   11/23/2013 10:07 AM

## 2013-11-23 NOTE — Addendum Note (Signed)
Addended by: Laureen Abrahams on: 11/23/2013 04:42 PM   Modules accepted: Orders

## 2013-11-23 NOTE — Telephone Encounter (Signed)
pt pof to sch a appt-pt stated has My Chart and will look  on that

## 2013-12-11 ENCOUNTER — Ambulatory Visit: Payer: BC Managed Care – PPO | Attending: Plastic Surgery | Admitting: Physical Therapy

## 2013-12-11 DIAGNOSIS — C50919 Malignant neoplasm of unspecified site of unspecified female breast: Secondary | ICD-10-CM | POA: Insufficient documentation

## 2013-12-11 DIAGNOSIS — IMO0001 Reserved for inherently not codable concepts without codable children: Secondary | ICD-10-CM | POA: Insufficient documentation

## 2013-12-11 DIAGNOSIS — I890 Lymphedema, not elsewhere classified: Secondary | ICD-10-CM | POA: Insufficient documentation

## 2013-12-12 ENCOUNTER — Ambulatory Visit: Payer: BC Managed Care – PPO

## 2013-12-18 ENCOUNTER — Encounter: Payer: Self-pay | Admitting: Family Medicine

## 2013-12-18 ENCOUNTER — Ambulatory Visit (INDEPENDENT_AMBULATORY_CARE_PROVIDER_SITE_OTHER): Payer: BC Managed Care – PPO | Admitting: Family Medicine

## 2013-12-18 VITALS — BP 125/83 | HR 95 | Ht 62.0 in | Wt 130.0 lb

## 2013-12-18 DIAGNOSIS — I10 Essential (primary) hypertension: Secondary | ICD-10-CM

## 2013-12-18 DIAGNOSIS — E039 Hypothyroidism, unspecified: Secondary | ICD-10-CM

## 2013-12-18 MED ORDER — HYDROCHLOROTHIAZIDE 25 MG PO TABS: ORAL_TABLET | ORAL | Status: DC

## 2013-12-18 NOTE — Progress Notes (Signed)
   Subjective:    Patient ID: Brittney Vance, female    DOB: 08/18/1965, 48 y.o.   MRN: [id number]  HPI  Hypertension- Pt denies chest pain, SOB, dizziness, or heart palpitations.  Taking meds as directed w/o problems.  Denies medication side effects.    Hypothyroid - no skin or hair changes. No wight changes. No fatigue. Feels like current dose is working well.   Undergoing bilat reconstruction of breast s/p mastectomy. She has spacers in right now.   Review of Systems     Objective:   Physical Exam  Constitutional: She is oriented to person, place, and time. She appears well-developed and well-nourished.  HENT:  Head: Normocephalic and atraumatic.  Cardiovascular: Normal rate, regular rhythm and normal heart sounds.   Pulmonary/Chest: Effort normal and breath sounds normal.  Neurological: She is alert and oriented to person, place, and time.  Skin: Skin is warm and dry.  Psychiatric: She has a normal mood and affect. Her behavior is normal.          Assessment & Plan:  Hypertension-well-controlled on current regimen.  Hypothyroid - recheck TSH. She has labs in July. Wants to wait until then, which is fine since she is asymptomatic.

## 2013-12-19 ENCOUNTER — Other Ambulatory Visit: Payer: Self-pay | Admitting: *Deleted

## 2013-12-19 MED ORDER — LEVOTHYROXINE SODIUM 88 MCG PO TABS: 88.0000 ug | ORAL_TABLET | Freq: Every day | ORAL | Status: DC

## 2013-12-20 ENCOUNTER — Ambulatory Visit: Payer: BC Managed Care – PPO | Admitting: Family Medicine

## 2013-12-25 ENCOUNTER — Ambulatory Visit: Payer: BC Managed Care – PPO | Attending: Plastic Surgery | Admitting: Physical Therapy

## 2013-12-25 DIAGNOSIS — IMO0001 Reserved for inherently not codable concepts without codable children: Secondary | ICD-10-CM | POA: Diagnosis not present

## 2013-12-25 DIAGNOSIS — I890 Lymphedema, not elsewhere classified: Secondary | ICD-10-CM | POA: Diagnosis not present

## 2013-12-25 DIAGNOSIS — C50919 Malignant neoplasm of unspecified site of unspecified female breast: Secondary | ICD-10-CM | POA: Insufficient documentation

## 2013-12-26 ENCOUNTER — Ambulatory Visit: Payer: BC Managed Care – PPO | Admitting: Obstetrics & Gynecology

## 2013-12-27 ENCOUNTER — Encounter: Payer: BC Managed Care – PPO | Admitting: Physical Therapy

## 2014-01-02 ENCOUNTER — Ambulatory Visit: Payer: BC Managed Care – PPO | Admitting: Physical Therapy

## 2014-01-02 DIAGNOSIS — IMO0001 Reserved for inherently not codable concepts without codable children: Secondary | ICD-10-CM | POA: Diagnosis not present

## 2014-01-04 ENCOUNTER — Encounter: Payer: BC Managed Care – PPO | Admitting: Physical Therapy

## 2014-01-07 ENCOUNTER — Ambulatory Visit: Payer: BC Managed Care – PPO

## 2014-01-07 DIAGNOSIS — IMO0001 Reserved for inherently not codable concepts without codable children: Secondary | ICD-10-CM | POA: Diagnosis not present

## 2014-01-08 ENCOUNTER — Ambulatory Visit: Payer: BC Managed Care – PPO

## 2014-01-08 DIAGNOSIS — IMO0001 Reserved for inherently not codable concepts without codable children: Secondary | ICD-10-CM | POA: Diagnosis not present

## 2014-01-14 ENCOUNTER — Ambulatory Visit: Payer: BC Managed Care – PPO

## 2014-01-14 DIAGNOSIS — IMO0001 Reserved for inherently not codable concepts without codable children: Secondary | ICD-10-CM | POA: Diagnosis not present

## 2014-01-15 ENCOUNTER — Ambulatory Visit: Payer: BC Managed Care – PPO | Admitting: Physical Therapy

## 2014-01-15 DIAGNOSIS — IMO0001 Reserved for inherently not codable concepts without codable children: Secondary | ICD-10-CM | POA: Diagnosis not present

## 2014-01-16 ENCOUNTER — Ambulatory Visit (INDEPENDENT_AMBULATORY_CARE_PROVIDER_SITE_OTHER): Payer: BC Managed Care – PPO | Admitting: Obstetrics & Gynecology

## 2014-01-16 ENCOUNTER — Encounter: Payer: Self-pay | Admitting: Obstetrics & Gynecology

## 2014-01-16 VITALS — BP 124/81 | HR 102 | Resp 16 | Ht 63.0 in | Wt 130.0 lb

## 2014-01-16 DIAGNOSIS — Z124 Encounter for screening for malignant neoplasm of cervix: Secondary | ICD-10-CM

## 2014-01-16 DIAGNOSIS — Z1151 Encounter for screening for human papillomavirus (HPV): Secondary | ICD-10-CM

## 2014-01-16 DIAGNOSIS — Z01419 Encounter for gynecological examination (general) (routine) without abnormal findings: Secondary | ICD-10-CM

## 2014-01-16 NOTE — Progress Notes (Signed)
  Subjective:    Brittney Vance is a 48 y.o. female who presents for an annual exam. The patient has no complaints today. The patient is sexually active. GYN screening history: last pap: was normal. The patient wears seatbelts: yes. The patient participates in regular exercise: no. Has the patient ever been transfused or tattooed?: no. The patient reports that there is not domestic violence in her life.  Pt s/p treatment for breast cancer.  Had bilateral mastectomy and undergoing reconstruction on February 12, 2014.  BRCA neg.  Menstrual History: OB History   Grav Para Term Preterm Abortions TAB SAB Ect Mult Living   2 2               Patient's last menstrual period was 03/10/2013.    The following portions of the patient's history were reviewed and updated as appropriate: allergies, current medications, past family history, past medical history, past social history, past surgical history and problem list.  Review of Systems Pertinent items are noted in HPI.    Objective:      Filed Vitals:   01/16/14 0901  BP: 124/81  Pulse: 102  Resp: 16  Height: $Remove'5\' 3"'INOTeBs$  (1.6 m)  Weight: 130 lb (58.968 kg)   Vitals:  WNL General appearance: alert, cooperative and no distress Head: Normocephalic, without obvious abnormality, atraumatic Eyes: negative Throat: lips, mucosa, and tongue normal; teeth and gums normal Lungs: clear to auscultation bilaterally Breasts: normal appearance, no masses or tenderness, No nipple retraction or dimpling, No nipple discharge or bleeding Heart: regular rate and rhythm Abdomen: soft, non-tender; bowel sounds normal; no masses,  no organomegaly  Pelvic:  External Genitalia:  Tanner V, no lesion Urethra:  No prolapse Vagina:  Pale pink, normal rugae, no blood or discharge Cervix:  No CMT, no lesion Uterus:  Normal size and contour, non tender Adnexa:  Normal, no masses, non tender Rectovaginal Septum:  Non tender, no masses  Extremities: no edema, redness or  tenderness in the calves or thighs Skin: no lesions or rash Lymph nodes: Axillary adenopathy: none     .    Assessment:    Healthy female exam.    Plan:     Pap smear. Cotesting.   Pt s/p treatment for breast cancer.  Had bilateral mastectomy and undergoing reconstruction on February 12, 2014.  BRCA neg. No menopausal bleeding. Encouraged to floss daily.

## 2014-01-16 NOTE — Addendum Note (Signed)
Addended by: Asencion Islam on: 01/16/2014 09:46 AM   Modules accepted: Orders

## 2014-01-17 LAB — CYTOLOGY - PAP

## 2014-01-17 NOTE — Progress Notes (Signed)
Quick Note:  Call patient: Your Pap smear is normal. Repeat in 2-3 years. ______ 

## 2014-01-31 ENCOUNTER — Ambulatory Visit: Payer: BC Managed Care – PPO | Attending: Plastic Surgery | Admitting: Physical Therapy

## 2014-01-31 DIAGNOSIS — IMO0001 Reserved for inherently not codable concepts without codable children: Secondary | ICD-10-CM | POA: Diagnosis not present

## 2014-01-31 DIAGNOSIS — C50919 Malignant neoplasm of unspecified site of unspecified female breast: Secondary | ICD-10-CM | POA: Diagnosis not present

## 2014-01-31 DIAGNOSIS — I890 Lymphedema, not elsewhere classified: Secondary | ICD-10-CM | POA: Insufficient documentation

## 2014-02-01 ENCOUNTER — Ambulatory Visit: Payer: BC Managed Care – PPO | Admitting: Physical Therapy

## 2014-02-01 DIAGNOSIS — IMO0001 Reserved for inherently not codable concepts without codable children: Secondary | ICD-10-CM | POA: Diagnosis not present

## 2014-02-07 ENCOUNTER — Encounter: Payer: BC Managed Care – PPO | Admitting: Physical Therapy

## 2014-02-08 ENCOUNTER — Encounter: Payer: BC Managed Care – PPO | Admitting: Physical Therapy

## 2014-02-13 ENCOUNTER — Telehealth: Payer: Self-pay

## 2014-02-13 NOTE — Telephone Encounter (Signed)
Returned pt call requesting to move appt to earlier in day on 8/3.  Let pt know [facility] is overbooked on 8/3 and has no openings until September.  Gave the pt the option of seeing a nurse practitioner, which she does not want to do.  Pt stated "she would work something out".  Appt left on [facility] schedule as is.

## 2014-02-14 ENCOUNTER — Telehealth: Payer: Self-pay | Admitting: Oncology

## 2014-02-14 NOTE — Telephone Encounter (Signed)
Pt lft msg to call, did not state why, cld pt lft msg.Marland KitchenMarland KitchenKJ

## 2014-02-15 ENCOUNTER — Other Ambulatory Visit: Payer: Self-pay | Admitting: *Deleted

## 2014-02-15 DIAGNOSIS — C50919 Malignant neoplasm of unspecified site of unspecified female breast: Secondary | ICD-10-CM

## 2014-02-15 DIAGNOSIS — C50412 Malignant neoplasm of upper-outer quadrant of left female breast: Secondary | ICD-10-CM

## 2014-02-18 ENCOUNTER — Other Ambulatory Visit (HOSPITAL_BASED_OUTPATIENT_CLINIC_OR_DEPARTMENT_OTHER): Payer: BC Managed Care – PPO

## 2014-02-18 DIAGNOSIS — C50919 Malignant neoplasm of unspecified site of unspecified female breast: Secondary | ICD-10-CM

## 2014-02-18 DIAGNOSIS — C50419 Malignant neoplasm of upper-outer quadrant of unspecified female breast: Secondary | ICD-10-CM

## 2014-02-18 DIAGNOSIS — C50412 Malignant neoplasm of upper-outer quadrant of left female breast: Secondary | ICD-10-CM

## 2014-02-18 LAB — COMPREHENSIVE METABOLIC PANEL (CC13)
ALK PHOS: 97 U/L (ref 40–150)
ALT: 17 U/L (ref 0–55)
AST: 22 U/L (ref 5–34)
Albumin: 3.9 g/dL (ref 3.5–5.0)
Anion Gap: 7 mEq/L (ref 3–11)
BUN: 10.2 mg/dL (ref 7.0–26.0)
CO2: 32 mEq/L — ABNORMAL HIGH (ref 22–29)
Calcium: 10.1 mg/dL (ref 8.4–10.4)
Chloride: 102 mEq/L (ref 98–109)
Creatinine: 0.7 mg/dL (ref 0.6–1.1)
Glucose: 82 mg/dl (ref 70–140)
Potassium: 3.6 mEq/L (ref 3.5–5.1)
Sodium: 141 mEq/L (ref 136–145)
Total Bilirubin: 0.29 mg/dL (ref 0.20–1.20)
Total Protein: 7.7 g/dL (ref 6.4–8.3)

## 2014-02-18 LAB — CBC WITH DIFFERENTIAL/PLATELET
BASO%: 0.8 % (ref 0.0–2.0)
BASOS ABS: 0 10*3/uL (ref 0.0–0.1)
EOS%: 4 % (ref 0.0–7.0)
Eosinophils Absolute: 0.1 10*3/uL (ref 0.0–0.5)
HCT: 36.5 % (ref 34.8–46.6)
HEMOGLOBIN: 12.1 g/dL (ref 11.6–15.9)
LYMPH%: 46 % (ref 14.0–49.7)
MCH: 29.6 pg (ref 25.1–34.0)
MCHC: 33.2 g/dL (ref 31.5–36.0)
MCV: 89.2 fL (ref 79.5–101.0)
MONO#: 0.4 10*3/uL (ref 0.1–0.9)
MONO%: 11.1 % (ref 0.0–14.0)
NEUT#: 1.2 10*3/uL — ABNORMAL LOW (ref 1.5–6.5)
NEUT%: 38.1 % — ABNORMAL LOW (ref 38.4–76.8)
Platelets: 252 10*3/uL (ref 145–400)
RBC: 4.09 10*6/uL (ref 3.70–5.45)
RDW: 13.9 % (ref 11.2–14.5)
WBC: 3.3 10*3/uL — ABNORMAL LOW (ref 3.9–10.3)
lymph#: 1.5 10*3/uL (ref 0.9–3.3)

## 2014-02-18 LAB — TSH: TSH: 0.248 u[IU]/mL — AB (ref 0.350–4.500)

## 2014-02-25 ENCOUNTER — Ambulatory Visit (HOSPITAL_BASED_OUTPATIENT_CLINIC_OR_DEPARTMENT_OTHER): Payer: BC Managed Care – PPO | Admitting: Oncology

## 2014-02-25 VITALS — BP 120/86 | HR 86 | Temp 98.3°F | Resp 18 | Ht 63.0 in | Wt 131.7 lb

## 2014-02-25 DIAGNOSIS — C50412 Malignant neoplasm of upper-outer quadrant of left female breast: Secondary | ICD-10-CM

## 2014-02-25 DIAGNOSIS — Z171 Estrogen receptor negative status [ER-]: Secondary | ICD-10-CM

## 2014-02-25 DIAGNOSIS — C50419 Malignant neoplasm of upper-outer quadrant of unspecified female breast: Secondary | ICD-10-CM

## 2014-02-25 NOTE — Progress Notes (Addendum)
ID: Newell Coral Scarfone OB: [birth year]  MR#: [id number]  LKG#:[id number]  PCP: Beatrice Lecher, MD GYN:  Veneda Melter SU: [name]; [name] OTHER MD: [name]  CHIEF COMPLAINT: Left breast cancer, triple negative  CURRENT THERAPY:  None, s/p neoadjuvant therapy and bilateral mastectomies   HISTORY OF PRESENT ILLNESS: Brittney Vance had routine screening mammography 03/09/2013 showing heterogeneously dense breasts, and a possible asymmetry in the left breast. Diagnostic left mammography and ultrasonography at the breast Center 03/30/2013 found a lobulated mass in the left breast upper outer quadrant measuring 1.6 cm. This was palpable. Ultrasound showed a complex microlobulated hypoechoic mass measuring 1.8 cm. There was no left axillary lymphadenopathy noted.  Biopsy of the mass in question 04/06/2013 showed ([id number]) and invasive ductal carcinoma, grade 3, triple negative, with an MIB-1 of 65%.  Bilateral breast MRI 04/13/2013 found an irregular mass measuring 2.5 cm in the left breast with a few lymph nodes that showed moderate cortical thickness, the largest measuring 12 mm (level I).  The patient's subsequent history is as detailed below  INTERVAL HISTORY: Jazelle returns today for followup of her left-sided breast cancer. She underwent bilateral mastectomies with left sentinel lymph node sampling 10/30/2013. Two weeks ago she had the expanders removed and bilateral breast reconstructive surgery with implants.   REVIEW OF SYSTEMS: Indyah is doing well post surgery. She is still mildly fatigued. Her chest throbs intermittently, but the pain is controlled with meds. She experiences hot flashes intermittently, but states it's "nothing I can't handle on my own."   PAST MEDICAL HISTORY: Past Medical History  Diagnosis Date  . Fibroids   . Hypertension   . Hypothyroid   . Breast cancer 2015    left, triple negative     PAST SURGICAL HISTORY: Past Surgical History  Procedure  Laterality Date  . Tubal ligation    . Portacath placement Right 04/27/2013    Procedure: ULTRASOUND GUIDED PORT INSERTION WITH FLUOROSCOPY;  Surgeon: [name], MD;  Location: [facility];  Service: General;  Laterality: Right;  . Breast biopsy Left 04/24/2013  . Total mastectomy Bilateral 10/30/2013    Procedure: TOTAL MASTECTOMY;  Surgeon: [name], MD;  Location: [place];  Service: General;  Laterality: Bilateral;  . Axillary lymph node biopsy Left 10/30/2013    Procedure: AXILLARY LYMPH NODE BIOPSY;  Surgeon: [name], MD;  Location: [place];  Service: General;  Laterality: Left;  . Port-a-cath removal Right 10/30/2013    Procedure: REMOVAL PORT-A-CATH;  Surgeon: [name], MD;  Location: [place];  Service: General;  Laterality: Right;  . Breast reconstruction with placement of tissue expander and flex hd (acellular hydrated dermis) Bilateral 10/30/2013    Procedure: BILATERAL BREAST RECONSTRUCTION WITH PLACEMENT OF TISSUE EXPANDER AND FLEX HD (ACELLULAR HYDRATED DERMIS);  Surgeon: [name], MD;  Location: [place];  Service: Plastics;  Laterality: Bilateral;    FAMILY HISTORY Family History  Problem Relation Age of Onset  . Hypertension Mother   . Diabetes Mother   . Alzheimer's disease Mother   . Hypertension Father   . Diabetes Father   . Hypertension Brother   . Epilepsy Son 4    being worked up for autism   the patient's father died in his 66Y from complications of diabetes. The patient's mother is living, in her mid 91s. The patient had 2 brothers, and 2 sisters. There is no history of cancer in the family to her knowledge.  GYNECOLOGIC HISTORY:    (updated 10/03/2013)  Menarche age 36, first live birth age 21. The patient is GX P2. She is postmenopausal, but did have some perimenopausal bleeding, which was evaluated age 11 08/14/2012 with a transabdominal and transvaginal ultrasound of the pelvis which found a normal uterine myometrium and left ovary. The right  ovary could not be visualized.  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 She's currently out of work on disability due to her breast cancer and chemotherapy. Her husband [name] also works for the [employer] system. Their children are [name], [place], 5. Ocean's mother currently lives in the home as well. The patient attends a [employer].   ADVANCED DIRECTIVES: Not in place   HEALTH MAINTENANCE: (Updated 10/03/2013) History  Substance Use Topics  . Smoking status: Never Smoker   . Smokeless tobacco: Never Used  . Alcohol Use: 1.2 oz/week    2 Glasses of wine per week     Comment: occassion     Colonoscopy:- Never  PAP: UTD 2015  Bone density: Never  Lipid panel: Dr. [name]    Allergies  Allergen Reactions  . Chocolate Itching  . Latex Itching  . Shrimp [Shellfish Allergy] Itching  . Wheat Bran Itching    Current Outpatient Prescriptions  Medication Sig Dispense Refill  . hydrochlorothiazide (HYDRODIURIL) 25 MG tablet TAKE 1 TABLET (25 MG TOTAL) BY MOUTH DAILY.  90 tablet  1  . HYDROmorphone (DILAUDID) 2 MG tablet Take 1-2 tablets (2-4 mg total) by mouth every 6 (six) hours as needed for severe pain.  40 tablet  0  . levothyroxine (SYNTHROID, LEVOTHROID) 88 MCG tablet Take 1 tablet (88 mcg total) by mouth daily before breakfast.  30 tablet  2  . methocarbamol (ROBAXIN) 500 MG tablet Take 1 tablet (500 mg total) by mouth every 6 (six) hours as needed for muscle spasms.  40 tablet  1   No current facility-administered medications for this visit.   PHYSICAL EXAM: Middle-aged [place] American woman who appears stated age 48 Vitals:   02/25/14 1550  BP: 120/86  Pulse: 86  Temp: 98.3 F (36.8 C)  Resp: 18  Body mass index is 23.34 kg/(m^2).  ECOG: 1 Filed Weights   02/25/14 1550  Weight: 131 lb 11.2 oz (59.739 kg)   Skin: warm, dry  HEENT: sclerae anicteric, conjunctivae pink,  oropharynx clear. No thrush or mucositis.  Lymph Nodes: No cervical or supraclavicular lymphadenopathy  Lungs: clear to auscultation bilaterally, no rales, wheezes, or rhonci  Heart: regular rate and rhythm  Abdomen: round, soft, non tender, positive bowel sounds  Musculoskeletal: No focal spinal tenderness, no peripheral edema  Neuro: non focal, well oriented,  Breasts: status post bilateral reconstructive surgery. Excellent cosmetic results with the exception of some irregularity in the shape of the left breast at 11 o'clock. Bilateral suture lines clean, dry, and well approximated with no edema or erythema.. Left axilla benign.  No evidence of recurrent disease.     LAB RESULTS:  Lab Results  Component Value Date   WBC 3.3* 02/18/2014   NEUTROABS 1.2* 02/18/2014   HGB 12.1 02/18/2014   HCT 36.5 02/18/2014   MCV 89.2 02/18/2014   PLT 252 02/18/2014      Chemistry      Component Value Date/Time   NA 141 02/18/2014 0949   NA 143 10/22/2013 1019   K 3.6 02/18/2014 0949   K 4.1 10/22/2013 1019   CL 102 10/22/2013 1019  CO2 32* 02/18/2014 0949   CO2 27 10/22/2013 1019   BUN 10.2 02/18/2014 0949   BUN 7 10/22/2013 1019   CREATININE 0.7 02/18/2014 0949   CREATININE 0.51 10/22/2013 1019   CREATININE 0.67 04/11/2013 0927      Component Value Date/Time   CALCIUM 10.1 02/18/2014 0949   CALCIUM 9.9 10/22/2013 1019   ALKPHOS 97 02/18/2014 0949   ALKPHOS 81 10/22/2013 1019   AST 22 02/18/2014 0949   AST 30 10/22/2013 1019   ALT 17 02/18/2014 0949   ALT 30 10/22/2013 1019   BILITOT 0.29 02/18/2014 0949   BILITOT 0.3 10/22/2013 1019       STUDIES: No results found.  ASSESSMENT: 48 y.o. BRCA negative [place] woman   (1)  status post left breast upper outer quadrant biopsy 04/06/2013 for a clinical T2 N1, stage IIB invasive ductal carcinoma, high-grade, triple negative, with an MIB-1 of 65%  (2) left axillary lymph node biopsy 04/24/2013 negative for malignancy  (3)  treated in the neoadjuvant  setting:   (a) completed 4 dose dense cycles of doxorubicin/ cyclophosphamide 06/11/2013,.  (b)  completed 12 weekly cycles of carboplatin/ paclitaxel 09/24/2013  (4)  status post bilateral mastectomies 10/30/2013, showing benign results on the right and a complete pathologic response in the left, including both sentinel lymph nodes clear  (5) status post bilateral breast reconstructive surgery  PLAN: Sabeen is doing well post surgery, at least physically. When asked if she was satisfied with the results of the surgery, she says no, "its not the real thing. Its not the same." When her husband was asked, he simply stated that he was happy that it was finally over and that they could move forward. The patient was reassured by Dr. [name] Hakim that the reconstruction was well executed, despite them not being "the real thing." She was made aware of the option to have touch ups performed to areas she was particularly unhappy with.   The plan is to continue follow up visits with labs, alternating with the surgeon, every 3 months. Alica was encouraged to participate in the "Finding Your New Normal" class. She understands and agrees with the plan. She knows to call with any issues that arise before her next visit.    Marcelino Duster, NP   02/25/2014 3:57 PM   ADDENDUM: I met with Merlina and her husband today and congratulated her on having achieved the best possible response from her neoadjuvant chemotherapy. Patients who do achieve a complete pathologic response, as she has, have a good long-term prognosis.Marland Kitchen  She will continue to work with Dr. Harlow Mares regarding her reconstruction, but if I had to grade her in terms of symmetry and cosmesis I would give her reconstructed breasts at this point good marks.  She will see me again in November. If she sees Dr. Constance Holster bar in February, I will followup with her then in May and we will continue to "tag team her" every 3 months for the next 2 years as per the usual  guidelines, after which we would lengthen the followup interval.  I personally saw this patient and performed a substantive portion of this encounter with the listed APP documented above.   Chauncey Cruel, MD

## 2014-02-26 ENCOUNTER — Telehealth: Payer: Self-pay | Admitting: Oncology

## 2014-02-26 NOTE — Telephone Encounter (Signed)
, °

## 2014-02-27 ENCOUNTER — Encounter: Payer: Self-pay | Admitting: Oncology

## 2014-03-12 ENCOUNTER — Ambulatory Visit (INDEPENDENT_AMBULATORY_CARE_PROVIDER_SITE_OTHER): Payer: BC Managed Care – PPO | Admitting: Obstetrics & Gynecology

## 2014-03-12 ENCOUNTER — Encounter: Payer: Self-pay | Admitting: Obstetrics & Gynecology

## 2014-03-12 VITALS — BP 135/89 | HR 93 | Resp 16 | Ht 62.0 in | Wt 130.0 lb

## 2014-03-12 DIAGNOSIS — N9489 Other specified conditions associated with female genital organs and menstrual cycle: Secondary | ICD-10-CM | POA: Diagnosis not present

## 2014-03-12 DIAGNOSIS — N949 Unspecified condition associated with female genital organs and menstrual cycle: Secondary | ICD-10-CM

## 2014-03-12 NOTE — Progress Notes (Signed)
Subjective:     Patient ID: Brittney Vance, female   DOB: 01-10-1966, 48 y.o.   MRN: [id number]  HPI Pt presents for eval of painful sore near anus. She reports that the area had been sore for 2-3 days but feel better today.  She noted a lesion when she looked with a mirror.  Married for 10 years. Denies h/o genital HSV or prev lesion.     Review of Systems     Objective:   Physical Exam BP 135/89  Pulse 93  Resp 16  Ht 5\' 2"  (1.575 m)  Wt 130 lb (58.968 kg)  BMI 23.77 kg/m2  LMP 03/10/2013 Pt in NAD GU: EGBUS:  Pt has 2 small 0.5cm ulcerated lesions on left side of anus.  The base was erythematous and tender to palpation. HSV cx obtained     Assessment:     Small sore/vesicle around anus.  Possibly due to HSV in immunocompromised individual or trauma that pt did not realize.     Plan:        Keep area clean and dry. F/u HSV cx

## 2014-03-12 NOTE — Addendum Note (Signed)
Addended by: Asencion Islam on: 03/12/2014 02:17 PM   Modules accepted: Orders

## 2014-03-14 LAB — HERPES SIMPLEX VIRUS CULTURE: Organism ID, Bacteria: DETECTED

## 2014-03-15 ENCOUNTER — Encounter: Payer: Self-pay | Admitting: Family Medicine

## 2014-03-15 ENCOUNTER — Telehealth: Payer: Self-pay | Admitting: *Deleted

## 2014-03-15 DIAGNOSIS — A6000 Herpesviral infection of urogenital system, unspecified: Secondary | ICD-10-CM | POA: Insufficient documentation

## 2014-03-15 NOTE — Telephone Encounter (Signed)
Pt notified of positive HSV culture.  Will talk with Dr. Ihor Dow to see if an antiviral will be ordered.

## 2014-03-20 ENCOUNTER — Telehealth: Payer: Self-pay | Admitting: *Deleted

## 2014-03-20 NOTE — Telephone Encounter (Signed)
Pt aware that if she gets another outbreak she will call the office if she has another outbreak for a RX for Valtrex.

## 2014-03-20 NOTE — Telephone Encounter (Signed)
Message copied by Asencion Islam on Wed Mar 20, 2014 11:23 AM ------      Message from: Lavonia Drafts      Created: Tue Mar 19, 2014  3:20 PM       Thank you for reviewing the labs with this pt.  Please call to let her know that if she has an outbreak she can call and get a Rx for Valtrex.  She should be prophylaxed within 48-72 hours of an outbreak to [place] the outbreak.  Please make sure that she understands that the ,med is ONLY for symptomatic relief.            Valtrex 500mg  bid x 3 days.            Thx,      clh-S   ------

## 2014-04-02 ENCOUNTER — Ambulatory Visit: Payer: BC Managed Care – PPO | Attending: Plastic Surgery | Admitting: Physical Therapy

## 2014-04-02 DIAGNOSIS — I890 Lymphedema, not elsewhere classified: Secondary | ICD-10-CM | POA: Diagnosis not present

## 2014-04-02 DIAGNOSIS — IMO0001 Reserved for inherently not codable concepts without codable children: Secondary | ICD-10-CM | POA: Insufficient documentation

## 2014-04-02 DIAGNOSIS — C50919 Malignant neoplasm of unspecified site of unspecified female breast: Secondary | ICD-10-CM | POA: Insufficient documentation

## 2014-04-04 ENCOUNTER — Ambulatory Visit: Payer: BC Managed Care – PPO | Admitting: Physical Therapy

## 2014-04-04 DIAGNOSIS — IMO0001 Reserved for inherently not codable concepts without codable children: Secondary | ICD-10-CM | POA: Diagnosis not present

## 2014-04-05 ENCOUNTER — Ambulatory Visit: Payer: BC Managed Care – PPO | Admitting: Physical Therapy

## 2014-04-05 DIAGNOSIS — IMO0001 Reserved for inherently not codable concepts without codable children: Secondary | ICD-10-CM | POA: Diagnosis not present

## 2014-04-08 ENCOUNTER — Ambulatory Visit: Payer: BC Managed Care – PPO | Admitting: Physical Therapy

## 2014-04-08 DIAGNOSIS — IMO0001 Reserved for inherently not codable concepts without codable children: Secondary | ICD-10-CM | POA: Diagnosis not present

## 2014-04-10 ENCOUNTER — Ambulatory Visit: Payer: BC Managed Care – PPO | Admitting: Physical Therapy

## 2014-04-10 DIAGNOSIS — IMO0001 Reserved for inherently not codable concepts without codable children: Secondary | ICD-10-CM | POA: Diagnosis not present

## 2014-04-11 ENCOUNTER — Ambulatory Visit: Payer: BC Managed Care – PPO | Admitting: Physical Therapy

## 2014-04-11 DIAGNOSIS — IMO0001 Reserved for inherently not codable concepts without codable children: Secondary | ICD-10-CM | POA: Diagnosis not present

## 2014-04-17 ENCOUNTER — Ambulatory Visit: Payer: BC Managed Care – PPO | Admitting: Physical Therapy

## 2014-04-17 DIAGNOSIS — IMO0001 Reserved for inherently not codable concepts without codable children: Secondary | ICD-10-CM | POA: Diagnosis not present

## 2014-04-18 ENCOUNTER — Ambulatory Visit: Payer: BC Managed Care – PPO | Admitting: Physical Therapy

## 2014-04-18 DIAGNOSIS — IMO0001 Reserved for inherently not codable concepts without codable children: Secondary | ICD-10-CM | POA: Diagnosis not present

## 2014-04-19 ENCOUNTER — Ambulatory Visit: Payer: BC Managed Care – PPO | Admitting: Physical Therapy

## 2014-04-19 ENCOUNTER — Encounter: Payer: BC Managed Care – PPO | Admitting: Physical Therapy

## 2014-04-19 DIAGNOSIS — IMO0001 Reserved for inherently not codable concepts without codable children: Secondary | ICD-10-CM | POA: Diagnosis not present

## 2014-04-22 ENCOUNTER — Ambulatory Visit: Payer: BC Managed Care – PPO | Admitting: Physical Therapy

## 2014-04-22 DIAGNOSIS — IMO0001 Reserved for inherently not codable concepts without codable children: Secondary | ICD-10-CM | POA: Diagnosis not present

## 2014-04-23 ENCOUNTER — Other Ambulatory Visit: Payer: Self-pay | Admitting: Family Medicine

## 2014-04-24 ENCOUNTER — Ambulatory Visit: Payer: BC Managed Care – PPO | Admitting: Physical Therapy

## 2014-04-24 DIAGNOSIS — IMO0001 Reserved for inherently not codable concepts without codable children: Secondary | ICD-10-CM | POA: Diagnosis not present

## 2014-04-26 ENCOUNTER — Ambulatory Visit: Payer: BC Managed Care – PPO | Attending: Plastic Surgery | Admitting: Physical Therapy

## 2014-04-26 DIAGNOSIS — M25519 Pain in unspecified shoulder: Secondary | ICD-10-CM | POA: Diagnosis present

## 2014-04-26 DIAGNOSIS — M24519 Contracture, unspecified shoulder: Secondary | ICD-10-CM | POA: Diagnosis not present

## 2014-04-26 DIAGNOSIS — C50919 Malignant neoplasm of unspecified site of unspecified female breast: Secondary | ICD-10-CM | POA: Insufficient documentation

## 2014-04-26 DIAGNOSIS — Z9013 Acquired absence of bilateral breasts and nipples: Secondary | ICD-10-CM | POA: Diagnosis not present

## 2014-04-30 ENCOUNTER — Ambulatory Visit: Payer: BC Managed Care – PPO | Admitting: Physical Therapy

## 2014-04-30 DIAGNOSIS — C50919 Malignant neoplasm of unspecified site of unspecified female breast: Secondary | ICD-10-CM | POA: Diagnosis not present

## 2014-05-01 ENCOUNTER — Ambulatory Visit: Payer: BC Managed Care – PPO | Admitting: Physical Therapy

## 2014-05-01 DIAGNOSIS — C50919 Malignant neoplasm of unspecified site of unspecified female breast: Secondary | ICD-10-CM | POA: Diagnosis not present

## 2014-05-03 ENCOUNTER — Ambulatory Visit: Payer: BC Managed Care – PPO | Admitting: Physical Therapy

## 2014-05-03 DIAGNOSIS — C50919 Malignant neoplasm of unspecified site of unspecified female breast: Secondary | ICD-10-CM | POA: Diagnosis not present

## 2014-05-06 ENCOUNTER — Ambulatory Visit: Payer: BC Managed Care – PPO | Admitting: Physical Therapy

## 2014-05-08 ENCOUNTER — Ambulatory Visit: Payer: BC Managed Care – PPO | Admitting: Physical Therapy

## 2014-05-08 DIAGNOSIS — C50919 Malignant neoplasm of unspecified site of unspecified female breast: Secondary | ICD-10-CM | POA: Diagnosis not present

## 2014-05-09 ENCOUNTER — Ambulatory Visit: Payer: BC Managed Care – PPO | Admitting: Physical Therapy

## 2014-05-09 DIAGNOSIS — C50919 Malignant neoplasm of unspecified site of unspecified female breast: Secondary | ICD-10-CM | POA: Diagnosis not present

## 2014-05-10 ENCOUNTER — Ambulatory Visit: Payer: BC Managed Care – PPO | Admitting: Physical Therapy

## 2014-05-10 DIAGNOSIS — C50919 Malignant neoplasm of unspecified site of unspecified female breast: Secondary | ICD-10-CM | POA: Diagnosis not present

## 2014-05-14 ENCOUNTER — Ambulatory Visit: Payer: BC Managed Care – PPO | Admitting: Physical Therapy

## 2014-05-14 ENCOUNTER — Encounter: Payer: Self-pay | Admitting: *Deleted

## 2014-05-15 ENCOUNTER — Ambulatory Visit: Payer: BC Managed Care – PPO

## 2014-05-15 DIAGNOSIS — C50919 Malignant neoplasm of unspecified site of unspecified female breast: Secondary | ICD-10-CM | POA: Diagnosis not present

## 2014-05-16 ENCOUNTER — Ambulatory Visit: Payer: BC Managed Care – PPO | Admitting: Physical Therapy

## 2014-05-16 DIAGNOSIS — C50919 Malignant neoplasm of unspecified site of unspecified female breast: Secondary | ICD-10-CM | POA: Diagnosis not present

## 2014-05-17 ENCOUNTER — Encounter: Payer: BC Managed Care – PPO | Admitting: Physical Therapy

## 2014-05-21 ENCOUNTER — Ambulatory Visit: Payer: BC Managed Care – PPO

## 2014-05-23 ENCOUNTER — Ambulatory Visit: Payer: BC Managed Care – PPO | Admitting: Physical Therapy

## 2014-05-23 DIAGNOSIS — C50919 Malignant neoplasm of unspecified site of unspecified female breast: Secondary | ICD-10-CM | POA: Diagnosis not present

## 2014-05-24 ENCOUNTER — Ambulatory Visit: Payer: BC Managed Care – PPO | Admitting: Physical Therapy

## 2014-05-27 ENCOUNTER — Encounter: Payer: Self-pay | Admitting: Obstetrics & Gynecology

## 2014-06-11 ENCOUNTER — Ambulatory Visit (INDEPENDENT_AMBULATORY_CARE_PROVIDER_SITE_OTHER): Payer: BC Managed Care – PPO | Admitting: Family Medicine

## 2014-06-11 VITALS — BP 128/72 | HR 83

## 2014-06-11 DIAGNOSIS — I10 Essential (primary) hypertension: Secondary | ICD-10-CM

## 2014-06-11 DIAGNOSIS — Z111 Encounter for screening for respiratory tuberculosis: Secondary | ICD-10-CM

## 2014-06-11 NOTE — Progress Notes (Signed)
   Subjective:    Patient ID: Brittney Vance, female    DOB: 1965/08/21, 48 y.o.   MRN: [id number]  HPI Hypertension- Pt denies chest pain, SOB, dizziness, or heart palpitations.  Taking meds as directed w/o problems.  Denies medication side effects.    Here for TB skin test for work.  Has been out of work for a year.  Says her mother recently tested positive but doesn't have active TB.   Review of Systems     Objective:   Physical Exam  Constitutional: She is oriented to person, place, and time. She appears well-developed and well-nourished.  HENT:  Head: Normocephalic and atraumatic.  Cardiovascular: Normal rate, regular rhythm and normal heart sounds.   Pulmonary/Chest: Effort normal and breath sounds normal.  Neurological: She is alert and oriented to person, place, and time.  Skin: Skin is warm and dry.  Psychiatric: She has a normal mood and affect. Her behavior is normal.          Assessment & Plan:  HTN - well controlled. F/U in 6 months.  bloodwork uptodate  TB skin test placed.

## 2014-06-13 ENCOUNTER — Other Ambulatory Visit: Payer: Self-pay | Admitting: Family Medicine

## 2014-06-13 ENCOUNTER — Ambulatory Visit (INDEPENDENT_AMBULATORY_CARE_PROVIDER_SITE_OTHER): Payer: BC Managed Care – PPO | Admitting: Family Medicine

## 2014-06-13 VITALS — Temp 98.8°F

## 2014-06-13 DIAGNOSIS — Z23 Encounter for immunization: Secondary | ICD-10-CM | POA: Diagnosis not present

## 2014-06-13 LAB — TB SKIN TEST
INDURATION: 0 mm
TB Skin Test: NEGATIVE

## 2014-06-13 NOTE — Progress Notes (Signed)
   Subjective:    Patient ID: Brittney Vance, female    DOB: [birth year], 48 y.o.   MRN: [id number]  HPI Here for flu vaccine  Review of Systems     Objective:   Physical Exam        Assessment & Plan:

## 2014-06-14 ENCOUNTER — Ambulatory Visit: Payer: BC Managed Care – PPO | Admitting: Family Medicine

## 2014-06-19 ENCOUNTER — Ambulatory Visit: Payer: BC Managed Care – PPO | Admitting: Family Medicine

## 2014-06-21 ENCOUNTER — Other Ambulatory Visit: Payer: Self-pay | Admitting: Family Medicine

## 2014-06-24 ENCOUNTER — Telehealth: Payer: Self-pay | Admitting: Oncology

## 2014-06-24 ENCOUNTER — Ambulatory Visit (HOSPITAL_BASED_OUTPATIENT_CLINIC_OR_DEPARTMENT_OTHER): Payer: BC Managed Care – PPO | Admitting: Oncology

## 2014-06-24 ENCOUNTER — Other Ambulatory Visit (HOSPITAL_BASED_OUTPATIENT_CLINIC_OR_DEPARTMENT_OTHER): Payer: BC Managed Care – PPO

## 2014-06-24 ENCOUNTER — Other Ambulatory Visit: Payer: Self-pay | Admitting: Family Medicine

## 2014-06-24 ENCOUNTER — Ambulatory Visit: Payer: BC Managed Care – PPO | Admitting: Oncology

## 2014-06-24 ENCOUNTER — Other Ambulatory Visit: Payer: Self-pay | Admitting: *Deleted

## 2014-06-24 VITALS — BP 151/94 | HR 90 | Temp 98.6°F | Resp 18 | Ht 62.0 in | Wt 137.3 lb

## 2014-06-24 DIAGNOSIS — C50412 Malignant neoplasm of upper-outer quadrant of left female breast: Secondary | ICD-10-CM

## 2014-06-24 DIAGNOSIS — Z170 Estrogen receptor positive status [ER+]: Secondary | ICD-10-CM

## 2014-06-24 DIAGNOSIS — E039 Hypothyroidism, unspecified: Secondary | ICD-10-CM

## 2014-06-24 LAB — CBC WITH DIFFERENTIAL/PLATELET
BASO%: 0.7 % (ref 0.0–2.0)
BASOS ABS: 0 10*3/uL (ref 0.0–0.1)
EOS ABS: 0.1 10*3/uL (ref 0.0–0.5)
EOS%: 2.5 % (ref 0.0–7.0)
HCT: 38.9 % (ref 34.8–46.6)
HEMOGLOBIN: 12.7 g/dL (ref 11.6–15.9)
LYMPH%: 46.4 % (ref 14.0–49.7)
MCH: 29.7 pg (ref 25.1–34.0)
MCHC: 32.7 g/dL (ref 31.5–36.0)
MCV: 91 fL (ref 79.5–101.0)
MONO#: 0.4 10*3/uL (ref 0.1–0.9)
MONO%: 8.9 % (ref 0.0–14.0)
NEUT%: 41.5 % (ref 38.4–76.8)
NEUTROS ABS: 2.1 10*3/uL (ref 1.5–6.5)
Platelets: 244 10*3/uL (ref 145–400)
RBC: 4.27 10*6/uL (ref 3.70–5.45)
RDW: 13.6 % (ref 11.2–14.5)
WBC: 5 10*3/uL (ref 3.9–10.3)
lymph#: 2.3 10*3/uL (ref 0.9–3.3)

## 2014-06-24 LAB — COMPREHENSIVE METABOLIC PANEL (CC13)
ALBUMIN: 4.1 g/dL (ref 3.5–5.0)
ALT: 21 U/L (ref 0–55)
AST: 24 U/L (ref 5–34)
Alkaline Phosphatase: 114 U/L (ref 40–150)
Anion Gap: 12 mEq/L — ABNORMAL HIGH (ref 3–11)
BUN: 14.9 mg/dL (ref 7.0–26.0)
CALCIUM: 9.8 mg/dL (ref 8.4–10.4)
CHLORIDE: 104 meq/L (ref 98–109)
CO2: 28 mEq/L (ref 22–29)
Creatinine: 0.8 mg/dL (ref 0.6–1.1)
Glucose: 101 mg/dl (ref 70–140)
POTASSIUM: 3.4 meq/L — AB (ref 3.5–5.1)
Sodium: 144 mEq/L (ref 136–145)
TOTAL PROTEIN: 7.6 g/dL (ref 6.4–8.3)
Total Bilirubin: 0.3 mg/dL (ref 0.20–1.20)

## 2014-06-24 LAB — TSH: TSH: 0.438 u[IU]/mL (ref 0.350–4.500)

## 2014-06-24 NOTE — Progress Notes (Signed)
ID: Newell Coral Trautman OB: 11/22/1965  MR#: [id number]  YCX#:[id number]  PCP: Beatrice Lecher, MD GYN:  Veneda Melter SU: [name]; [name] OTHER MD: [name]  CHIEF COMPLAINT: Left breast cancer, triple negative  CURRENT THERAPY:   observation  HISTORY OF PRESENT ILLNESS:  from the original intake note:  Ginnie had routine screening mammography 03/09/2013 showing heterogeneously dense breasts, and a possible asymmetry in the left breast. Diagnostic left mammography and ultrasonography at the breast Center 03/30/2013 found a lobulated mass in the left breast upper outer quadrant measuring 1.6 cm. This was palpable. Ultrasound showed a complex microlobulated hypoechoic mass measuring 1.8 cm. There was no left axillary lymphadenopathy noted.  Biopsy of the mass in question 04/06/2013 showed ([id number]) and invasive ductal carcinoma, grade 3, triple negative, with an MIB-1 of 65%.  Bilateral breast MRI 04/13/2013 found an irregular mass measuring 2.5 cm in the left breast with a few lymph nodes that showed moderate cortical thickness, the largest measuring 12 mm (level I).  The patient's subsequent history is as detailed below  INTERVAL HISTORY: Analaura returns today for followup of her left-sided breast cancer.  Her visit appointment had been canceled , but not her lab appointment, and she requested to be worked in so she would not have to come back for a second trip.  REVIEW OF SYSTEMS: Coral  Is back at work " part-time full-time". They are making accommodations for her until she is strong enough to go back "full-time full-time" as before. She is very appreciative of that. She just " got her nipples back" and is still healing from that. Occasionally she has pain in the left axilla particularly. She never did go to physical therapy.  She has not noted any swelling in the left upper extremity. They have been no unusual headaches, visual changes, cough, phlegm production,  pleurisy, shortness of breath, or change in bowel or bladder habits. A detailed review of systems today was stable  PAST MEDICAL HISTORY: Past Medical History  Diagnosis Date  . Fibroids   . Hypertension   . Hypothyroid   . Breast cancer 2015    left, triple negative     PAST SURGICAL HISTORY: Past Surgical History  Procedure Laterality Date  . Tubal ligation    . Portacath placement Right 04/27/2013    Procedure: ULTRASOUND GUIDED PORT INSERTION WITH FLUOROSCOPY;  Surgeon: [name], MD;  Location: [facility];  Service: General;  Laterality: Right;  . Breast biopsy Left 04/24/2013  . Total mastectomy Bilateral 10/30/2013    Procedure: TOTAL MASTECTOMY;  Surgeon: [name], MD;  Location: [place];  Service: General;  Laterality: Bilateral;  . Axillary lymph node biopsy Left 10/30/2013    Procedure: AXILLARY LYMPH NODE BIOPSY;  Surgeon: [name], MD;  Location: [place];  Service: General;  Laterality: Left;  . Port-a-cath removal Right 10/30/2013    Procedure: REMOVAL PORT-A-CATH;  Surgeon: [name], MD;  Location: [place];  Service: General;  Laterality: Right;  . Breast reconstruction with placement of tissue expander and flex hd (acellular hydrated dermis) Bilateral 10/30/2013    Procedure: BILATERAL BREAST RECONSTRUCTION WITH PLACEMENT OF TISSUE EXPANDER AND FLEX HD (ACELLULAR HYDRATED DERMIS);  Surgeon: [name], MD;  Location: [place];  Service: Plastics;  Laterality: Bilateral;    FAMILY HISTORY Family History  Problem Relation Age of Onset  . Hypertension Mother   . Diabetes Mother   . Alzheimer's disease Mother   . Hypertension Father   .  Diabetes Father   . Hypertension Brother   . Epilepsy Son 4    being worked up for autism   the patient's father died in his 51Z from complications of diabetes. The patient's mother is living, in her mid 48s. The patient had 2 brothers, and 2 sisters. There is no history of cancer in the family to her  knowledge.  GYNECOLOGIC HISTORY:    (updated 10/03/2013)  Menarche age 30, first live birth age 45. The patient is GX P2. She is postmenopausal, but did have some perimenopausal bleeding, which was evaluated age 40 08/14/2012 with a transabdominal and transvaginal ultrasound of the pelvis which found a normal uterine myometrium and left ovary. The right ovary could not be visualized.  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 She's currently out of work on disability due to her breast cancer and chemotherapy. Her husband [name] also works for the [employer] system. Their children are [name], [place], 5. Arnesha's mother currently lives in the home as well. The patient attends a [employer].   ADVANCED DIRECTIVES: Not in place   HEALTH MAINTENANCE: (Updated 10/03/2013) History  Substance Use Topics  . Smoking status: Never Smoker   . Smokeless tobacco: Never Used  . Alcohol Use: 1.2 oz/week    2 Glasses of wine per week     Comment: occassion     Colonoscopy:- Never  PAP: UTD 2015  Bone density: Never  Lipid panel: Dr. [name]    Allergies  Allergen Reactions  . Chocolate Itching  . Latex Itching  . Shrimp [Shellfish Allergy] Itching  . Wheat Bran Itching    Current Outpatient Prescriptions  Medication Sig Dispense Refill  . hydrochlorothiazide (HYDRODIURIL) 25 MG tablet TAKE 1 TABLET (25 MG TOTAL) BY MOUTH DAILY. 30 tablet 0  . SYNTHROID 88 MCG tablet TAKE 1 TABLET (88 MCG TOTAL) BY MOUTH DAILY BEFORE BREAKFAST. 30 tablet 5   No current facility-administered medications for this visit.    OBJECTIVE:  Middle-aged African-American woman in no acute distress Filed Vitals:   06/24/14 1611  BP: 151/94  Pulse: 90  Temp: 98.6 F (37 C)  Resp: 18  Body mass index is 25.11 kg/(m^2).  ECOG: 1 Filed Weights   06/24/14 1611  Weight: 137 lb 4.8 oz (62.279 kg)   Sclerae unicteric, pupils  equal and reactive Oropharynx clear and moist No cervical or supraclavicular adenopathy Lungs no rales or rhonchi Heart regular rate and rhythm Abd soft, nontender, positive bowel sounds MSK no focal spinal tenderness, no upper extremity lymphedema Neuro: nonfocal, well oriented, appropriate affect Breasts:  Status post bilateral mastectomies, with implant reconstruction. There is no evidence of local recurrence. The left axilla is benign.      LAB RESULTS:  Lab Results  Component Value Date   WBC 5.0 06/24/2014   NEUTROABS 2.1 06/24/2014   HGB 12.7 06/24/2014   HCT 38.9 06/24/2014   MCV 91.0 06/24/2014   PLT 244 06/24/2014      Chemistry      Component Value Date/Time   NA 141 02/18/2014 0949   NA 143 10/22/2013 1019   K 3.6 02/18/2014 0949   K 4.1 10/22/2013 1019   CL 102 10/22/2013 1019   CO2 32* 02/18/2014 0949   CO2 27 10/22/2013 1019   BUN 10.2 02/18/2014 0949   BUN 7 10/22/2013 1019   CREATININE 0.7 02/18/2014 0949   CREATININE 0.51 10/22/2013 1019  CREATININE 0.67 04/11/2013 0927      Component Value Date/Time   CALCIUM 10.1 02/18/2014 0949   CALCIUM 9.9 10/22/2013 1019   ALKPHOS 97 02/18/2014 0949   ALKPHOS 81 10/22/2013 1019   AST 22 02/18/2014 0949   AST 30 10/22/2013 1019   ALT 17 02/18/2014 0949   ALT 30 10/22/2013 1019   BILITOT 0.29 02/18/2014 0949   BILITOT 0.3 10/22/2013 1019       STUDIES: No results found.   ASSESSMENT: 48 y.o. BRCA negative [place] woman   (1)  status post left breast upper outer quadrant biopsy 04/06/2013 for a clinical T2 N1, stage IIB invasive ductal carcinoma, high-grade, triple negative, with an MIB-1 of 65%  (2) left axillary lymph node biopsy 04/24/2013 negative for malignancy  (3)  treated in the neoadjuvant setting:   (a) completed 4 dose dense cycles of doxorubicin/ cyclophosphamide 06/11/2013,.  (b)  completed 12 weekly cycles of carboplatin/ paclitaxel 09/24/2013  (4)  status post bilateral  mastectomies 10/30/2013, showing benign results on the right and a complete pathologic response in the left, including both sentinel lymph nodes clear  (5) status post bilateral breast implant reconstructive surgery  PLAN: Alonna is still recovering from her chemotherapy and surgery.  She has a little bit of frozen shoulder on the left and significant discomfort to palpation in the left axilla. I think she would greatly benefit from physical therapy but we don't want to do anything to compromise her cosmetic results. I've asked her to check with Dr. Harlow Mares and as soon as he gives her the go-ahead we will put in the referral to physical therapy.  Otherwise I have encouraged her to exercise as much as she is allowed and she does have an exercise bike at home that she can use. She is hoping to start swimming once she get clearance for that. I think that will also be very good in terms of stretching.   Rosina will see [place] again in 3 months. We will continue to see her every 3 months, likely alternating with her surgeon, for another year, before going to every 6 month follow-up.   She knows to call for any problems that may develop before her next visit here.   Chauncey Cruel, MD   06/24/2014 4:18 PM

## 2014-06-24 NOTE — Telephone Encounter (Signed)
gv pt appt schedule for feb 2016 °

## 2014-06-25 ENCOUNTER — Telehealth: Payer: Self-pay | Admitting: *Deleted

## 2014-06-25 NOTE — Telephone Encounter (Signed)
Pt was to have a tsh drawn also. She had these labs done at [facility]. I will contact their office [phone] to see if this can be added on to previous labs.Brittney Vance [place]

## 2014-08-26 ENCOUNTER — Telehealth: Payer: Self-pay | Admitting: *Deleted

## 2014-08-26 NOTE — Telephone Encounter (Signed)
Pt called and stated that she has been experiencing some pelvic pain when standing, and wanted to know if Dr. [name] would order a pelvic US for her I informed her that she would need to be seen first before this can be done. I asked her if she has spoken w/her GYN she stated that she has and they wouldn't be able to get her in for some time. appt scheduled for 2/12.Brittney Vance

## 2014-09-03 ENCOUNTER — Encounter: Payer: Self-pay | Admitting: Family Medicine

## 2014-09-03 ENCOUNTER — Ambulatory Visit (INDEPENDENT_AMBULATORY_CARE_PROVIDER_SITE_OTHER): Payer: BC Managed Care – PPO | Admitting: Family Medicine

## 2014-09-03 VITALS — BP 122/82 | HR 68 | Ht 62.0 in | Wt 139.0 lb

## 2014-09-03 DIAGNOSIS — R102 Pelvic and perineal pain: Secondary | ICD-10-CM

## 2014-09-03 LAB — POCT URINALYSIS DIPSTICK
BILIRUBIN UA: NEGATIVE
Blood, UA: NEGATIVE
Glucose, UA: NEGATIVE
Ketones, UA: NEGATIVE
LEUKOCYTES UA: NEGATIVE
Nitrite, UA: NEGATIVE
PH UA: 5.5
PROTEIN UA: NEGATIVE
SPEC GRAV UA: 1.02
UROBILINOGEN UA: 0.2

## 2014-09-03 NOTE — Progress Notes (Signed)
   Subjective:    Patient ID: Brittney Vance, female    DOB: [birth year], 49 y.o.   MRN: [id number]  HPI Low pelvic pain on and off x 3 weeks.  Hx of fribroids.  No prior hx of abdominal surgery. No blood in urine or stool.  No heavy lifting or recent injury known. Worse w/ activity and better with rest. Las pap smear was in June 2015.  No urinary sxs.  She is post menopausal.  No discharge or tissue protrusion. No fevers or chills or sweats.  Review of Systems     Objective:   Physical Exam  Constitutional: She is oriented to person, place, and time. She appears well-developed and well-nourished.  HENT:  Head: Normocephalic and atraumatic.  Cardiovascular: Normal rate, regular rhythm and normal heart sounds.   Pulmonary/Chest: Effort normal and breath sounds normal.  Abdominal: Soft. Bowel sounds are normal. She exhibits no distension and no mass. There is tenderness. There is no rebound and no guarding.  Tender in the LLQ towards the suprapubic area.   Neurological: She is alert and oriented to person, place, and time.  Skin: Skin is warm and dry.  Psychiatric: She has a normal mood and affect. Her behavior is normal.          Assessment & Plan:  Pelvic pain - will order [place]. Offered to do pelvic exam today but she declined. I did check back to her note from June when she saw her OB/GYN and there was no notation of prolapse etc. And her Pap smear was normal at that time. Did do urinalysis today as well.

## 2014-09-04 ENCOUNTER — Ambulatory Visit (INDEPENDENT_AMBULATORY_CARE_PROVIDER_SITE_OTHER): Payer: BC Managed Care – PPO

## 2014-09-04 DIAGNOSIS — R102 Pelvic and perineal pain: Secondary | ICD-10-CM

## 2014-09-05 ENCOUNTER — Encounter: Payer: Self-pay | Admitting: Family Medicine

## 2014-09-06 ENCOUNTER — Ambulatory Visit: Payer: BC Managed Care – PPO | Admitting: Family Medicine

## 2014-09-11 ENCOUNTER — Encounter: Payer: Self-pay | Admitting: Obstetrics & Gynecology

## 2014-09-11 ENCOUNTER — Ambulatory Visit (INDEPENDENT_AMBULATORY_CARE_PROVIDER_SITE_OTHER): Payer: BC Managed Care – PPO | Admitting: Obstetrics & Gynecology

## 2014-09-11 VITALS — BP 134/87 | HR 95 | Resp 16 | Ht 63.0 in | Wt 136.0 lb

## 2014-09-11 DIAGNOSIS — R102 Pelvic and perineal pain: Secondary | ICD-10-CM | POA: Diagnosis not present

## 2014-09-11 NOTE — Progress Notes (Signed)
   Subjective:    Patient ID: Brittney Vance, female    DOB: Jun 26, 1966, 49 y.o.   MRN: [id number]  HPI  49 yo lady here today with the complaint of 3 week h/o feeling "pressure, pain, heaviness"  Right above her pubic bone. She feels this discomfort "inconsistently", but with walking, rising from a seated position. Sex has been uncomfortable for years. It is no different in the last 3 weeks.   Review of Systems Her LMP was prior to her chemo.    Objective:   Physical Exam [id number] Breathing normally Ambulating normally Neuro intact Abd- benign Bimanual exam reveals no masses and no tenderness       Assessment & Plan:   I am wondering if this pain is referred pain from her back. I have suggested chiro and offered MRI. She would like both of these.

## 2014-09-23 ENCOUNTER — Telehealth: Payer: Self-pay | Admitting: Nurse Practitioner

## 2014-09-23 ENCOUNTER — Other Ambulatory Visit: Payer: Self-pay | Admitting: Emergency Medicine

## 2014-09-23 ENCOUNTER — Encounter: Payer: Self-pay | Admitting: Nurse Practitioner

## 2014-09-23 ENCOUNTER — Other Ambulatory Visit (HOSPITAL_BASED_OUTPATIENT_CLINIC_OR_DEPARTMENT_OTHER): Payer: BC Managed Care – PPO

## 2014-09-23 ENCOUNTER — Ambulatory Visit (HOSPITAL_BASED_OUTPATIENT_CLINIC_OR_DEPARTMENT_OTHER): Payer: BC Managed Care – PPO | Admitting: Nurse Practitioner

## 2014-09-23 VITALS — BP 135/87 | HR 80 | Temp 98.7°F | Resp 18 | Wt 137.0 lb

## 2014-09-23 DIAGNOSIS — C50412 Malignant neoplasm of upper-outer quadrant of left female breast: Secondary | ICD-10-CM

## 2014-09-23 DIAGNOSIS — F32A Depression, unspecified: Secondary | ICD-10-CM

## 2014-09-23 DIAGNOSIS — Z171 Estrogen receptor negative status [ER-]: Secondary | ICD-10-CM

## 2014-09-23 DIAGNOSIS — M25559 Pain in unspecified hip: Secondary | ICD-10-CM | POA: Insufficient documentation

## 2014-09-23 DIAGNOSIS — M25552 Pain in left hip: Secondary | ICD-10-CM

## 2014-09-23 DIAGNOSIS — F329 Major depressive disorder, single episode, unspecified: Secondary | ICD-10-CM | POA: Insufficient documentation

## 2014-09-23 DIAGNOSIS — R1032 Left lower quadrant pain: Secondary | ICD-10-CM

## 2014-09-23 HISTORY — DX: Depression, unspecified: F32.A

## 2014-09-23 LAB — COMPREHENSIVE METABOLIC PANEL (CC13)
ALT: 18 U/L (ref 0–55)
AST: 22 U/L (ref 5–34)
Albumin: 4.1 g/dL (ref 3.5–5.0)
Alkaline Phosphatase: 115 U/L (ref 40–150)
Anion Gap: 9 meq/L (ref 3–11)
BUN: 9.5 mg/dL (ref 7.0–26.0)
CO2: 27 meq/L (ref 22–29)
Calcium: 9.9 mg/dL (ref 8.4–10.4)
Chloride: 104 meq/L (ref 98–109)
Creatinine: 0.7 mg/dL (ref 0.6–1.1)
EGFR: >90 ml/min/1.73 m2
Glucose: 113 mg/dL (ref 70–140)
Potassium: 3.2 meq/L — ABNORMAL LOW (ref 3.5–5.1)
Sodium: 140 meq/L (ref 136–145)
Total Bilirubin: 0.35 mg/dL (ref 0.20–1.20)
Total Protein: 7.7 g/dL (ref 6.4–8.3)

## 2014-09-23 LAB — CBC WITH DIFFERENTIAL/PLATELET
BASO%: 0.2 % (ref 0.0–2.0)
Basophils Absolute: 0 10*3/uL (ref 0.0–0.1)
EOS ABS: 0.1 10*3/uL (ref 0.0–0.5)
EOS%: 2.4 % (ref 0.0–7.0)
HCT: 38.9 % (ref 34.8–46.6)
HEMOGLOBIN: 13.6 g/dL (ref 11.6–15.9)
LYMPH%: 50.8 % — AB (ref 14.0–49.7)
MCH: 31.3 pg (ref 25.1–34.0)
MCHC: 35 g/dL (ref 31.5–36.0)
MCV: 89.4 fL (ref 79.5–101.0)
MONO#: 0.3 10*3/uL (ref 0.1–0.9)
MONO%: 6.5 % (ref 0.0–14.0)
NEUT#: 2 10*3/uL (ref 1.5–6.5)
NEUT%: 40.1 % (ref 38.4–76.8)
Platelets: 245 10*3/uL (ref 145–400)
RBC: 4.35 10*6/uL (ref 3.70–5.45)
RDW: 12.8 % (ref 11.2–14.5)
WBC: 4.9 10*3/uL (ref 3.9–10.3)
lymph#: 2.5 10*3/uL (ref 0.9–3.3)

## 2014-09-23 NOTE — Telephone Encounter (Signed)
, °

## 2014-09-23 NOTE — Progress Notes (Signed)
ID: Newell Coral Vanwieren OB: [birth year]  MR#: [id number]  YNW#:[id number]  PCP: Beatrice Lecher, MD GYN:  Veneda Melter SU: [name]; [name] OTHER MD: [place] COMPLAINT:Left breast cancer, triple negative  CURRENT THERAPY:  observation  BREAST CANCER HISTORY:  from the original intake note:  Nayomi had routine screening mammography 03/09/2013 showing heterogeneously dense breasts, and a possible asymmetry in the left breast. Diagnostic left mammography and ultrasonography at the [facility] Center 03/30/2013 found a lobulated mass in the left breast upper outer quadrant measuring 1.6 cm. This was palpable. Ultrasound showed a complex microlobulated hypoechoic mass measuring 1.8 cm. There was no left axillary lymphadenopathy noted.  Biopsy of the mass in question 04/06/2013 showed ([id number]) and invasive ductal carcinoma, grade 3, triple negative, with an MIB-1 of 65%.  Bilateral breast MRI 04/13/2013 found an irregular mass measuring 2.5 cm in the left breast with a few lymph nodes that showed moderate cortical thickness, the largest measuring 12 mm (level I).  The patient's subsequent history is as detailed below  INTERVAL HISTORY: Ariyan returns today for follow up of her left-sided breast cancer.  For the past 4 weeks, Sirenity has been complaining of pain and pressure to her left lower quadrant and immediately above the lower end of her midpelvic bone. She has had a transvaginal ultrasound and pelvic ultrasound with negative results. She is worried that this is the return of her cancer and would like more tests to be performed. The pain increases when she stands, and she can not do so for a long period of time.  REVIEW OF SYSTEMS: Momo denies fevers, chills, nausea, vomiting or changes in bowel or bladder habits. She has no shortness of breath, chest pain, cough, palpitations, or fatigue. She denies headaches, dizziness, or vision changes. Her appetite is fair. She has some  pain to the left arm and axilla since her surgery but this is no worse. She endorses depression and feels like she has no one to talk to, including her husband, who just does not understand. She would like to speak with a therapist on a regular basis. A detailed review of systems is otherwise stable.  PAST MEDICAL HISTORY: Past Medical History  Diagnosis Date  . Fibroids   . Hypertension   . Hypothyroid   . Breast cancer 2015    left, triple negative     PAST SURGICAL HISTORY: Past Surgical History  Procedure Laterality Date  . Tubal ligation    . Portacath placement Right 04/27/2013    Procedure: ULTRASOUND GUIDED PORT INSERTION WITH FLUOROSCOPY;  Surgeon: [name], MD;  Location: [facility];  Service: General;  Laterality: Right;  . Breast biopsy Left 04/24/2013  . Total mastectomy Bilateral 10/30/2013    Procedure: TOTAL MASTECTOMY;  Surgeon: [name], MD;  Location: [place];  Service: General;  Laterality: Bilateral;  . Axillary lymph node biopsy Left 10/30/2013    Procedure: AXILLARY LYMPH NODE BIOPSY;  Surgeon: [name], MD;  Location: [place];  Service: General;  Laterality: Left;  . Port-a-cath removal Right 10/30/2013    Procedure: REMOVAL PORT-A-CATH;  Surgeon: [name], MD;  Location: [place];  Service: General;  Laterality: Right;  . Breast reconstruction with placement of tissue expander and flex hd (acellular hydrated dermis) Bilateral 10/30/2013    Procedure: BILATERAL BREAST RECONSTRUCTION WITH PLACEMENT OF TISSUE EXPANDER AND FLEX HD (ACELLULAR HYDRATED DERMIS);  Surgeon: [name], MD;  Location: [place];  Service: Plastics;  Laterality: Bilateral;  FAMILY HISTORY Family History  Problem Relation Age of Onset  . Hypertension Mother   . Diabetes Mother   . Alzheimer's disease Mother   . Hypertension Father   . Diabetes Father   . Hypertension Brother   . Epilepsy Son 4    being worked up for autism   the patient's father died in his 70V from  complications of diabetes. The patient's mother is living, in her mid 50s. The patient had 2 brothers, and 2 sisters. There is no history of cancer in the family to her knowledge.  GYNECOLOGIC HISTORY:    (updated 10/03/2013)  Menarche age 23, first live birth age 23. The patient is GX P2. She is postmenopausal, but did have some perimenopausal bleeding, which was evaluated age 49 08/14/2012 with a transabdominal and transvaginal ultrasound of the pelvis which found a normal uterine myometrium and left ovary. The right ovary could not be visualized.  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 She's currently out of work on disability due to her breast cancer and chemotherapy. Her husband [name] also works for the [employer] system. Their children are [name], [place], 5. Brizeyda's mother currently lives in the home as well. The patient attends a [employer].   ADVANCED DIRECTIVES: Not in place   HEALTH MAINTENANCE: (Updated 10/03/2013) History  Substance Use Topics  . Smoking status: Never Smoker   . Smokeless tobacco: Never Used  . Alcohol Use: 1.2 oz/week    2 Glasses of wine per week     Comment: occassion     Colonoscopy:- Never  PAP: UTD 2015  Bone density: Never  Lipid panel: Dr. [name]    Allergies  Allergen Reactions  . Chocolate Itching  . Latex Itching  . Shrimp [Shellfish Allergy] Itching  . Wheat Bran Itching    Current Outpatient Prescriptions  Medication Sig Dispense Refill  . hydrochlorothiazide (HYDRODIURIL) 25 MG tablet TAKE 1 TABLET (25 MG TOTAL) BY MOUTH DAILY. 30 tablet 0  . SYNTHROID 88 MCG tablet TAKE 1 TABLET (88 MCG TOTAL) BY MOUTH DAILY BEFORE BREAKFAST. 30 tablet 5   No current facility-administered medications for this visit.    OBJECTIVE:  Middle-aged African-American woman in no acute distress Filed Vitals:   09/23/14 1537  BP: 135/87  Pulse: 80  Temp: 98.7  F (37.1 C)  Resp: 18  Body mass index is 24.27 kg/(m^2).  ECOG: 1 Filed Weights   09/23/14 1537  Weight: 137 lb (62.143 kg)   Skin: warm, dry  HEENT: sclerae anicteric, conjunctivae pink, oropharynx clear. No thrush or mucositis.  Lymph Nodes: No cervical or supraclavicular lymphadenopathy  Lungs: clear to auscultation bilaterally, no rales, wheezes, or rhonci  Heart: regular rate and rhythm  Abdomen: round, soft, tenderness to left lower quadrant, left groin, and suprapubic area, positive bowel sounds  Musculoskeletal: No focal spinal tenderness, no peripheral edema  Neuro: non focal, well oriented, positive affect  Breasts: bilateral breast status post mastectomies with implant and nipple reconstruction. No evidence of recurrent disease. Bilateral axillae benign.    LAB RESULTS:  Lab Results  Component Value Date   WBC 4.9 09/23/2014   NEUTROABS 2.0 09/23/2014   HGB 13.6 09/23/2014   HCT 38.9 09/23/2014   MCV 89.4 09/23/2014   PLT 245 09/23/2014      Chemistry      Component Value Date/Time   NA 140 09/23/2014 1527   NA 143  10/22/2013 1019   K 3.2* 09/23/2014 1527   K 4.1 10/22/2013 1019   CL 102 10/22/2013 1019   CO2 27 09/23/2014 1527   CO2 27 10/22/2013 1019   BUN 9.5 09/23/2014 1527   BUN 7 10/22/2013 1019   CREATININE 0.7 09/23/2014 1527   CREATININE 0.51 10/22/2013 1019   CREATININE 0.67 04/11/2013 0927      Component Value Date/Time   CALCIUM 9.9 09/23/2014 1527   CALCIUM 9.9 10/22/2013 1019   ALKPHOS 115 09/23/2014 1527   ALKPHOS 81 10/22/2013 1019   AST 22 09/23/2014 1527   AST 30 10/22/2013 1019   ALT 18 09/23/2014 1527   ALT 30 10/22/2013 1019   BILITOT 0.35 09/23/2014 1527   BILITOT 0.3 10/22/2013 1019       STUDIES: US Transvaginal Non-ob  09-09-2014   CLINICAL DATA:  Intermittent pelvic pain for 3 weeks.  EXAM: TRANSABDOMINAL AND TRANSVAGINAL ULTRASOUND OF PELVIS  TECHNIQUE: Both transabdominal and transvaginal ultrasound examinations of  the pelvis were performed. Transabdominal technique was performed for global imaging of the pelvis including uterus, ovaries, adnexal regions, and pelvic cul-de-sac. It was necessary to proceed with endovaginal exam following the transabdominal exam to visualize the uterus and ovaries.  COMPARISON:  None  FINDINGS: Uterus  Measurements: 6.5 x 3.2 x 4.3 cm. No fibroids or other mass visualized.  Endometrium  Thickness: 4 mm.  No focal abnormality visualized.  Right ovary  Measurements: 1.9 x 1.3 x 1.6 cm. Normal appearance/no adnexal mass.  Left ovary  Measurements: 2.5 x 1.5 x 1.5 cm. Normal appearance/no adnexal mass.  Other findings  No free fluid.  IMPRESSION: No acute or focal abnormality.   Electronically Signed   By: Marcello Moores  Register   On: Sep 09, 2014 16:05   US Pelvis Complete  09/09/2014   CLINICAL DATA:  Intermittent pelvic pain for 3 weeks.  EXAM: TRANSABDOMINAL AND TRANSVAGINAL ULTRASOUND OF PELVIS  TECHNIQUE: Both transabdominal and transvaginal ultrasound examinations of the pelvis were performed. Transabdominal technique was performed for global imaging of the pelvis including uterus, ovaries, adnexal regions, and pelvic cul-de-sac. It was necessary to proceed with endovaginal exam following the transabdominal exam to visualize the uterus and ovaries.  COMPARISON:  None  FINDINGS: Uterus  Measurements: 6.5 x 3.2 x 4.3 cm. No fibroids or other mass visualized.  Endometrium  Thickness: 4 mm.  No focal abnormality visualized.  Right ovary  Measurements: 1.9 x 1.3 x 1.6 cm. Normal appearance/no adnexal mass.  Left ovary  Measurements: 2.5 x 1.5 x 1.5 cm. Normal appearance/no adnexal mass.  Other findings  No free fluid.  IMPRESSION: No acute or focal abnormality.   Electronically Signed   By: Marcello Moores  Register   On: 09-09-2014 16:05     ASSESSMENT: 49 y.o. BRCA negative [place] woman   (1)  status post left breast upper outer quadrant biopsy 04/06/2013 for a clinical T2 N1, stage IIB invasive  ductal carcinoma, high-grade, triple negative, with an MIB-1 of 65%  (2) left axillary lymph node biopsy 04/24/2013 negative for malignancy  (3)  treated in the neoadjuvant setting:   (a) completed 4 dose dense cycles of doxorubicin/ cyclophosphamide 06/11/2013,.  (b)  completed 12 weekly cycles of carboplatin/ paclitaxel 09/24/2013  (4)  status post bilateral mastectomies 10/30/2013, showing benign results on the right and a complete pathologic response in the left, including both sentinel lymph nodes clear  (5) status post bilateral breast implant reconstructive surgery  PLAN: Halimah and I spent about 25 minutes discussing  her lower abdominal/pelvic discomfort. Her gynecologist ordered an MRI 2 weeks ago but it still hasn't been approved. Given her history of triple negative breast cancer, I do not believe it would be unreasonable to have a CT of the abdomen/pelvis performed, and it also may be easier to obtain insurance-wise. I have placed orders for this to be performed some time this week.   [name] and I also discussed her need for a therapist and I have referred her to Dr. Kyra Leyland for further evaluation. I believe she is experiencing a tremendous amount of post traumatic stress since her diagnosis. At this time she is unwilling to try anti-depressants.  Acelynn will return in 3 months for labs and a follow up visit. She understands and agrees with this plan. She has been encouraged to call with any issues that might arise before her next visit here.   Marcelino Duster, NP   09/23/2014 4:19 PM

## 2014-09-25 ENCOUNTER — Telehealth: Payer: Self-pay

## 2014-09-25 NOTE — Telephone Encounter (Signed)
Could not leave a message for patient to contact our office to advise that Dr. Hulan Fray would like to send her to orthopedic doctor since this way would be easier to get a MRI approved from [facility]. BCBS denied for MRI.

## 2014-09-26 NOTE — Addendum Note (Signed)
Addended by: Marcelino Duster on: 09/26/2014 04:51 PM   Modules accepted: Orders

## 2014-09-27 ENCOUNTER — Ambulatory Visit (HOSPITAL_COMMUNITY)
Admission: RE | Admit: 2014-09-27 | Discharge: 2014-09-27 | Disposition: A | Discharge status: Home / Self Care | Payer: BC Managed Care – PPO | Source: Ambulatory Visit | Attending: Nurse Practitioner | Admitting: Nurse Practitioner

## 2014-09-27 ENCOUNTER — Other Ambulatory Visit: Payer: Self-pay | Admitting: *Deleted

## 2014-09-27 ENCOUNTER — Encounter (HOSPITAL_COMMUNITY): Payer: Self-pay

## 2014-09-27 ENCOUNTER — Telehealth: Payer: Self-pay | Admitting: *Deleted

## 2014-09-27 DIAGNOSIS — R110 Nausea: Secondary | ICD-10-CM | POA: Insufficient documentation

## 2014-09-27 DIAGNOSIS — K8020 Calculus of gallbladder without cholecystitis without obstruction: Secondary | ICD-10-CM | POA: Diagnosis present

## 2014-09-27 DIAGNOSIS — C50412 Malignant neoplasm of upper-outer quadrant of left female breast: Secondary | ICD-10-CM

## 2014-09-27 DIAGNOSIS — K5730 Diverticulosis of large intestine without perforation or abscess without bleeding: Secondary | ICD-10-CM | POA: Diagnosis not present

## 2014-09-27 DIAGNOSIS — R1032 Left lower quadrant pain: Secondary | ICD-10-CM | POA: Insufficient documentation

## 2014-09-27 MED ORDER — IOHEXOL 300 MG/ML  SOLN
100.0000 mL | Freq: Once | INTRAMUSCULAR | Status: AC | PRN
  Administered 2014-09-27: 100 mL via INTRAVENOUS

## 2014-09-27 NOTE — Telephone Encounter (Signed)
Called pt to communicate with her the findings of her CT. Told pt test showed she may have a calcified uterine fibroid, but this can only be confirmed by having the pelvic MRI. I will call the [facility] with Dr. [name] to see if it's possible for pt to be granted the MRI. I will get back in contact w/ pt. Pt verbalized understanding. Message to be forwarded to [facility].

## 2014-09-30 ENCOUNTER — Other Ambulatory Visit: Payer: Self-pay | Admitting: Obstetrics & Gynecology

## 2014-09-30 ENCOUNTER — Telehealth: Payer: Self-pay | Admitting: *Deleted

## 2014-09-30 DIAGNOSIS — R1900 Intra-abdominal and pelvic swelling, mass and lump, unspecified site: Secondary | ICD-10-CM

## 2014-09-30 NOTE — Telephone Encounter (Signed)
Appt made for pelvic U/S @ [facility] per Dr Gala Romney.

## 2014-09-30 NOTE — Progress Notes (Signed)
Discussed case with Dr. Owens Shark.  She thinks targeted US will be able to identify the lesion found on CT.  Will rpt [place] before moving to MRI. [place] techs are to call radiologist and review images to make sure the radiologist has what he/she needs to read report.  Patient to stay in department until this is done (also discussed this with Dr. Owens Shark who believes this is a good idea).

## 2014-10-01 ENCOUNTER — Ambulatory Visit (HOSPITAL_COMMUNITY): Payer: BC Managed Care – PPO

## 2014-10-01 ENCOUNTER — Ambulatory Visit (HOSPITAL_COMMUNITY)
Admission: RE | Admit: 2014-10-01 | Discharge: 2014-10-01 | Disposition: A | Discharge status: Home / Self Care | Payer: BC Managed Care – PPO | Source: Ambulatory Visit | Attending: Obstetrics & Gynecology | Admitting: Obstetrics & Gynecology

## 2014-10-01 DIAGNOSIS — R1900 Intra-abdominal and pelvic swelling, mass and lump, unspecified site: Secondary | ICD-10-CM | POA: Diagnosis not present

## 2014-10-03 ENCOUNTER — Encounter: Payer: Self-pay | Admitting: Obstetrics & Gynecology

## 2014-10-03 DIAGNOSIS — D259 Leiomyoma of uterus, unspecified: Secondary | ICD-10-CM | POA: Insufficient documentation

## 2014-10-07 ENCOUNTER — Ambulatory Visit (INDEPENDENT_AMBULATORY_CARE_PROVIDER_SITE_OTHER): Payer: BC Managed Care – PPO | Admitting: Obstetrics & Gynecology

## 2014-10-07 ENCOUNTER — Encounter: Payer: Self-pay | Admitting: Obstetrics & Gynecology

## 2014-10-07 VITALS — BP 122/84 | HR 87 | Resp 16 | Ht 63.0 in | Wt 135.0 lb

## 2014-10-07 DIAGNOSIS — K5730 Diverticulosis of large intestine without perforation or abscess without bleeding: Secondary | ICD-10-CM

## 2014-10-07 DIAGNOSIS — K8020 Calculus of gallbladder without cholecystitis without obstruction: Secondary | ICD-10-CM

## 2014-10-07 DIAGNOSIS — D252 Subserosal leiomyoma of uterus: Secondary | ICD-10-CM

## 2014-10-07 NOTE — Patient Instructions (Signed)
Diverticulosis Diverticulosis is the condition that develops when small pouches (diverticula) form in the wall of your colon. Your colon, or large intestine, is where water is absorbed and stool is formed. The pouches form when the inside layer of your colon pushes through weak spots in the outer layers of your colon. CAUSES  No one knows exactly what causes diverticulosis. RISK FACTORS  Being older than 50. Your risk for this condition increases with age. Diverticulosis is rare in people younger than 40 years. By age 80, almost everyone has it.  Eating a low-fiber diet.  Being frequently constipated.  Being overweight.  Not getting enough exercise.  Smoking.  Taking over-the-counter pain medicines, like aspirin and ibuprofen. SYMPTOMS  Most people with diverticulosis do not have symptoms. DIAGNOSIS  Because diverticulosis often has no symptoms, health care providers often discover the condition during an exam for other colon problems. In many cases, a health care provider will diagnose diverticulosis while using a flexible scope to examine the colon (colonoscopy). TREATMENT  If you have never developed an infection related to diverticulosis, you may not need treatment. If you have had an infection before, treatment may include:  Eating more fruits, vegetables, and grains.  Taking a fiber supplement.  Taking a live bacteria supplement (probiotic).  Taking medicine to relax your colon. HOME CARE INSTRUCTIONS   Drink at least 6-8 glasses of water each day to prevent constipation.  Try not to strain when you have a bowel movement.  Keep all follow-up appointments. If you have had an infection before:  Increase the fiber in your diet as directed by your health care provider or dietitian.  Take a dietary fiber supplement if your health care provider approves.  Only take medicines as directed by your health care provider. SEEK MEDICAL CARE IF:   You have abdominal  pain.  You have bloating.  You have cramps.  You have not gone to the bathroom in 3 days. SEEK IMMEDIATE MEDICAL CARE IF:   Your pain gets worse.  Yourbloating becomes very bad.  You have a fever or chills, and your symptoms suddenly get worse.  You begin vomiting.  You have bowel movements that are bloody or black. MAKE SURE YOU:  Understand these instructions.  Will watch your condition.  Will get help right away if you are not doing well or get worse. Document Released: 04/08/2004 Document Revised: 07/17/2013 Document Reviewed: 06/06/2013 ExitCare Patient Information 2015 [employer]. This information is not intended to replace advice given to you by your health care provider. Make sure you discuss any questions you have with your health care provider.  

## 2014-10-07 NOTE — Progress Notes (Signed)
  Patient is a 49 year old female who presents for results from her CT and transvaginal ultrasounds. Patient is a breast cancer survivor and doing well. She received care at [facility]. Patient began having suprapubic and inguinal pain last month. She was had a transvaginal ultrasound ordered which was normal. There was some thought that the pain could be associated with lower back/disc issues. Insurance did not approve an MRI. Her cancer doctor prescribed a CT which showed a small pedunculated fibroid emanating off the fundal portion of the uterus. There is a clear stalk seen. Follow-up ultrasound then showed the same findings, which is a pedunculated fibroid. The good news is the patient is no longer having pain in her suprapubic region. She was able to walk 5 miles on Saturday without any issues.  We discussed that 80% of women have fibroids and almost all are certainly benign. We did discuss the rare sarcoma. Her fibroids does not have any findings consistent with sarcoma. The patient remains asymptomatic there is no reason for intervention. If the inguinal pain persists then possible evaluation by the orthopedist of her back would be warranted.  Patient was noted to have gallstones and diverticulosis on her CT. Patient is going for colonoscopy when she turns 16 later this year. We discussed a high fiber diet. GERD eats very well and frequently has recent [place] to her. [place]ar[place] gallstones, she developed right upper quadrant pain patient will follow up with her primary care doctor or general surgeon.  Patient's up-to-date with her healthcare maintenance.

## 2014-10-08 ENCOUNTER — Encounter: Payer: Self-pay | Admitting: *Deleted

## 2014-10-11 IMAGING — CR DG CHEST 2V
2 series · 2 of 2 positions shown · non-contrast
Comparison: None.

CLINICAL DATA: Breast carcinoma. Preop for Port-A-Cath placement.

EXAM:
CHEST  2 VIEW

[w chest pa]
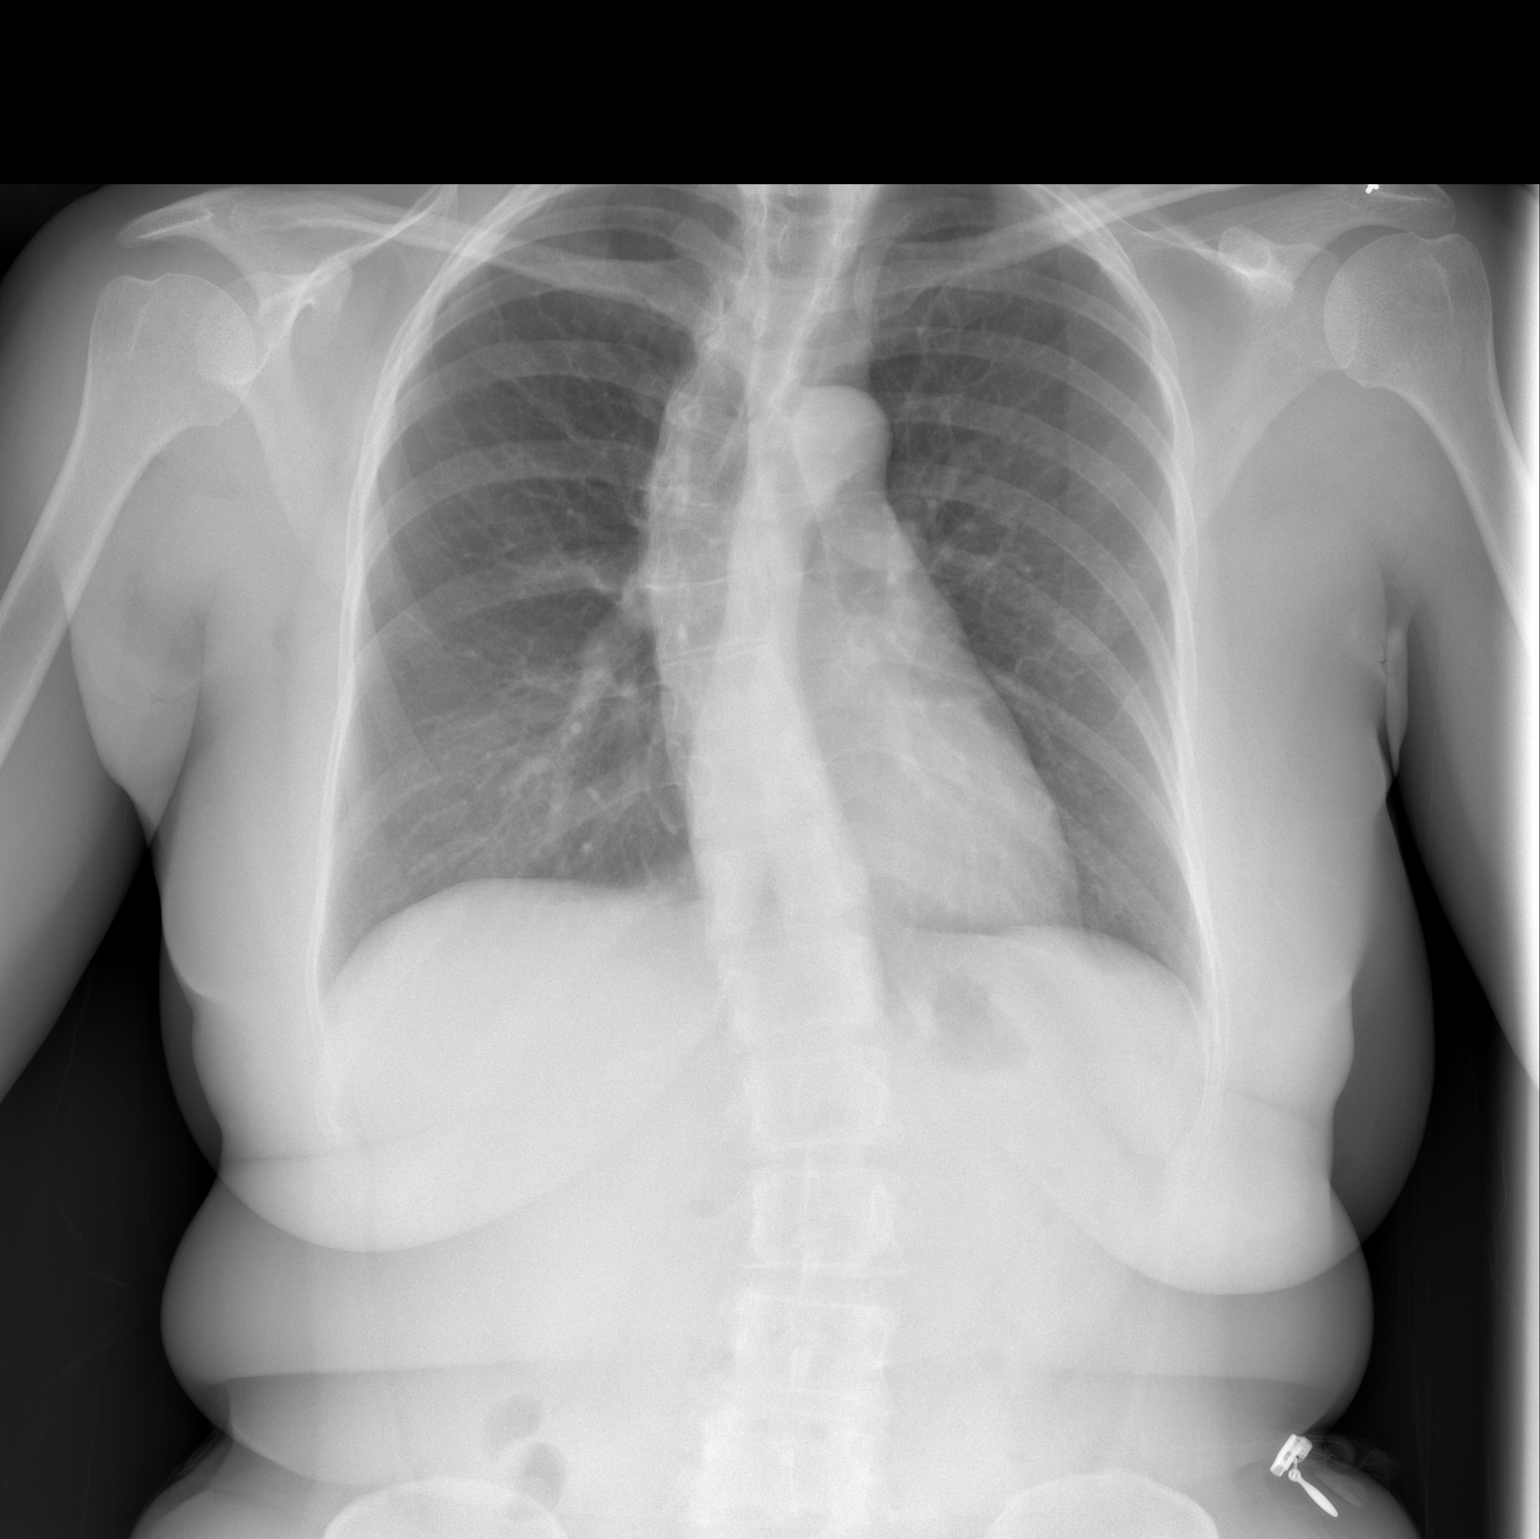

[w chest lat]
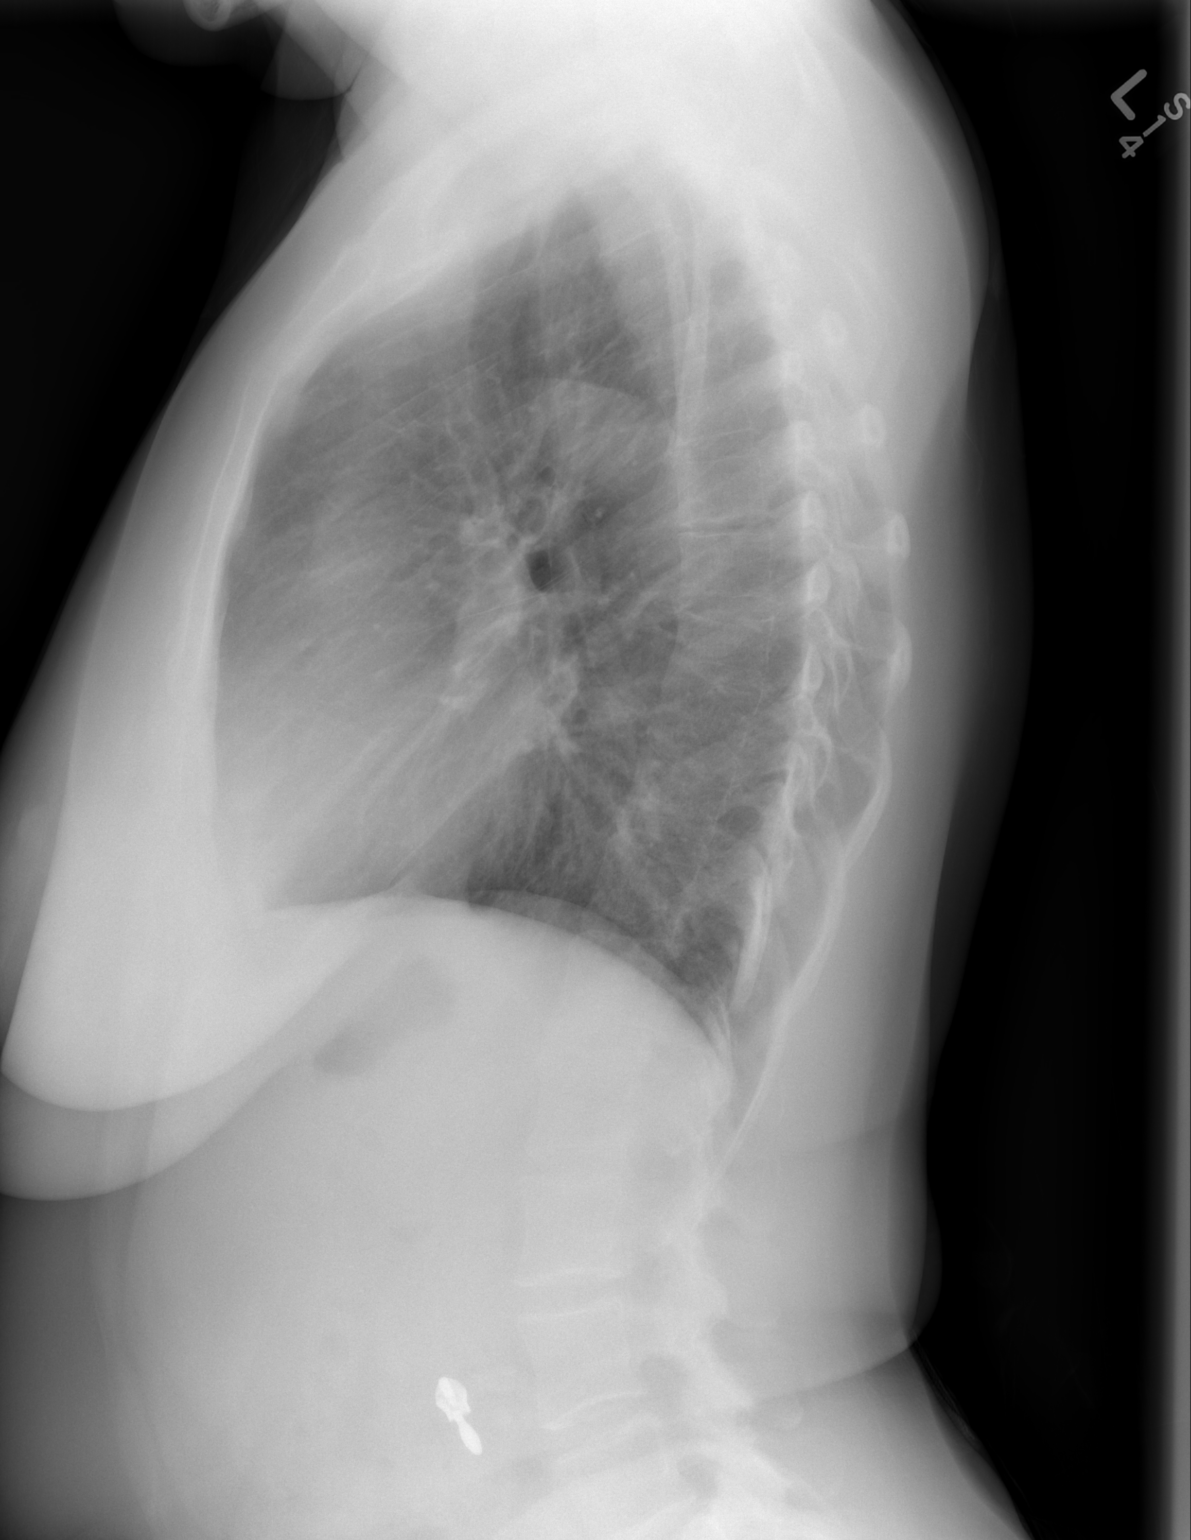

[2 of 2 positions shown; findings below may reference images not displayed]

FINDINGS: Heart size is normal. Both lungs are clear. No evidence of pleural
effusion. No mass or lymphadenopathy identified. Moderate S-shaped
thoracolumbar scoliosis is noted.
IMPRESSION: No active cardiopulmonary disease.  Thoracolumbar scoliosis.

## 2014-10-25 ENCOUNTER — Other Ambulatory Visit: Payer: Self-pay | Admitting: Family Medicine

## 2014-11-15 ENCOUNTER — Ambulatory Visit (INDEPENDENT_AMBULATORY_CARE_PROVIDER_SITE_OTHER): Payer: BC Managed Care – PPO | Admitting: Family Medicine

## 2014-11-15 ENCOUNTER — Encounter: Payer: Self-pay | Admitting: Family Medicine

## 2014-11-15 VITALS — BP 122/60 | HR 96 | Temp 98.8°F | Ht 62.0 in | Wt 137.0 lb

## 2014-11-15 DIAGNOSIS — J069 Acute upper respiratory infection, unspecified: Secondary | ICD-10-CM | POA: Diagnosis not present

## 2014-11-15 DIAGNOSIS — Z114 Encounter for screening for human immunodeficiency virus [HIV]: Secondary | ICD-10-CM

## 2014-11-15 DIAGNOSIS — I10 Essential (primary) hypertension: Secondary | ICD-10-CM

## 2014-11-15 DIAGNOSIS — Z13220 Encounter for screening for lipoid disorders: Secondary | ICD-10-CM

## 2014-11-15 DIAGNOSIS — J029 Acute pharyngitis, unspecified: Secondary | ICD-10-CM | POA: Diagnosis not present

## 2014-11-15 DIAGNOSIS — E039 Hypothyroidism, unspecified: Secondary | ICD-10-CM

## 2014-11-15 LAB — POCT RAPID STREP A (OFFICE): RAPID STREP A SCREEN: NEGATIVE

## 2014-11-15 NOTE — Patient Instructions (Signed)
Upper Respiratory Infection, Adult An upper respiratory infection (URI) is also sometimes known as the common cold. The upper respiratory tract includes the nose, sinuses, throat, trachea, and bronchi. Bronchi are the airways leading to the lungs. Most people improve within 1 week, but symptoms can last up to 2 weeks. A residual cough may last even longer.  CAUSES Many different viruses can infect the tissues lining the upper respiratory tract. The tissues become irritated and inflamed and often become very moist. Mucus production is also common. A cold is contagious. You can easily spread the virus to others by oral contact. This includes kissing, sharing a glass, coughing, or sneezing. Touching your mouth or nose and then touching a surface, which is then touched by another person, can also spread the virus. SYMPTOMS  Symptoms typically develop 1 to 3 days after you come in contact with a cold virus. Symptoms vary from person to person. They may include:  Runny nose.  Sneezing.  Nasal congestion.  Sinus irritation.  Sore throat.  Loss of voice (laryngitis).  Cough.  Fatigue.  Muscle aches.  Loss of appetite.  Headache.  Low-grade fever. DIAGNOSIS  You might diagnose your own cold based on familiar symptoms, since most people get a cold 2 to 3 times a year. Your caregiver can confirm this based on your exam. Most importantly, your caregiver can check that your symptoms are not due to another disease such as strep throat, sinusitis, pneumonia, asthma, or epiglottitis. Blood tests, throat tests, and X-rays are not necessary to diagnose a common cold, but they may sometimes be helpful in excluding other more serious diseases. Your caregiver will decide if any further tests are required. RISKS AND COMPLICATIONS  You may be at risk for a more severe case of the common cold if you smoke cigarettes, have chronic heart disease (such as heart failure) or lung disease (such as asthma), or if  you have a weakened immune system. The very young and very old are also at risk for more serious infections. Bacterial sinusitis, middle ear infections, and bacterial pneumonia can complicate the common cold. The common cold can worsen asthma and chronic obstructive pulmonary disease (COPD). Sometimes, these complications can require emergency medical care and may be life-threatening. PREVENTION  The best way to protect against getting a cold is to practice good hygiene. Avoid oral or hand contact with people with cold symptoms. Wash your hands often if contact occurs. There is no clear evidence that vitamin C, vitamin E, echinacea, or exercise reduces the chance of developing a cold. However, it is always recommended to get plenty of rest and practice good nutrition. TREATMENT  Treatment is directed at relieving symptoms. There is no cure. Antibiotics are not effective, because the infection is caused by a virus, not by bacteria. Treatment may include:  Increased fluid intake. Sports drinks offer valuable electrolytes, sugars, and fluids.  Breathing heated mist or steam (vaporizer or shower).  Eating chicken soup or other clear broths, and maintaining good nutrition.  Getting plenty of rest.  Using gargles or lozenges for comfort.  Controlling fevers with ibuprofen or acetaminophen as directed by your caregiver.  Increasing usage of your inhaler if you have asthma. Zinc gel and zinc lozenges, taken in the first 24 hours of the common cold, can shorten the duration and lessen the severity of symptoms. Pain medicines may help with fever, muscle aches, and throat pain. A variety of non-prescription medicines are available to treat congestion and runny nose. Your caregiver   can make recommendations and may suggest nasal or lung inhalers for other symptoms.  HOME CARE INSTRUCTIONS   Only take over-the-counter or prescription medicines for pain, discomfort, or fever as directed by your  caregiver.  Use a warm mist humidifier or inhale steam from a shower to increase air moisture. This may keep secretions moist and make it easier to breathe.  Drink enough water and fluids to keep your urine clear or pale yellow.  Rest as needed.  Return to work when your temperature has returned to normal or as your caregiver advises. You may need to stay home longer to avoid infecting others. You can also use a face mask and careful hand washing to prevent spread of the virus. SEEK MEDICAL CARE IF:   After the first few days, you feel you are getting worse rather than better.  You need your caregiver's advice about medicines to control symptoms.  You develop chills, worsening shortness of breath, or brown or red sputum. These may be signs of pneumonia.  You develop yellow or brown nasal discharge or pain in the face, especially when you bend forward. These may be signs of sinusitis.  You develop a fever, swollen neck glands, pain with swallowing, or white areas in the back of your throat. These may be signs of strep throat. SEEK IMMEDIATE MEDICAL CARE IF:   You have a fever.  You develop severe or persistent headache, ear pain, sinus pain, or chest pain.  You develop wheezing, a prolonged cough, cough up blood, or have a change in your usual mucus (if you have chronic lung disease).  You develop sore muscles or a stiff neck. Document Released: 01/05/2001 Document Revised: 10/04/2011 Document Reviewed: 10/17/2013 Exit[employer] Patient Information 2015 [employer]. This information is not intended to replace advice given to you by your health care provider. Make sure you discuss any questions you have with your health care provider.  

## 2014-11-15 NOTE — Progress Notes (Signed)
   Subjective:    Patient ID: Brittney Vance, female    DOB: [birth year], 49 y.o.   MRN: [id number]  HPI Patient complains of 3-4 days of recurrent severe headaches with sore throat. Today she's had a lot of thick postnasal drainage. She's tried some Mucinex Chloraseptic spray and take well.   Review of Systems     Objective:   Physical Exam  Constitutional: She is oriented to person, place, and time. She appears well-developed and well-nourished.  HENT:  Head: Normocephalic and atraumatic.  Right Ear: External ear normal.  Left Ear: External ear normal.  Nose: Nose normal.  Mouth/Throat: Oropharynx is clear and moist.  TMs and canals are clear.   Eyes: Conjunctivae and EOM are normal. Pupils are equal, round, and reactive to light.  Neck: Neck supple. No thyromegaly present.  Cardiovascular: Normal rate, regular rhythm and normal heart sounds.   Pulmonary/Chest: Effort normal and breath sounds normal. She has no wheezes.  Lymphadenopathy:    She has no cervical adenopathy.  Neurological: She is alert and oriented to person, place, and time.  Skin: Skin is warm and dry.  Psychiatric: She has a normal mood and affect.          Assessment & Plan:  URI- likely viral.  Continue sent to Medicare. Strep test was negative. Call if not better in one week or call sooner if suddenly worse or develops a fever.

## 2014-12-13 ENCOUNTER — Ambulatory Visit: Payer: BC Managed Care – PPO | Admitting: Family Medicine

## 2014-12-25 ENCOUNTER — Ambulatory Visit: Payer: BC Managed Care – PPO | Admitting: Oncology

## 2014-12-25 ENCOUNTER — Other Ambulatory Visit: Payer: BC Managed Care – PPO

## 2015-01-01 ENCOUNTER — Telehealth: Payer: Self-pay | Admitting: Oncology

## 2015-01-01 ENCOUNTER — Ambulatory Visit (HOSPITAL_BASED_OUTPATIENT_CLINIC_OR_DEPARTMENT_OTHER): Payer: BC Managed Care – PPO | Admitting: Oncology

## 2015-01-01 ENCOUNTER — Other Ambulatory Visit (HOSPITAL_BASED_OUTPATIENT_CLINIC_OR_DEPARTMENT_OTHER): Payer: BC Managed Care – PPO

## 2015-01-01 VITALS — BP 141/93 | HR 76 | Temp 97.9°F | Resp 18 | Ht 62.0 in | Wt 135.7 lb

## 2015-01-01 DIAGNOSIS — Z171 Estrogen receptor negative status [ER-]: Secondary | ICD-10-CM

## 2015-01-01 DIAGNOSIS — C50412 Malignant neoplasm of upper-outer quadrant of left female breast: Secondary | ICD-10-CM

## 2015-01-01 DIAGNOSIS — N840 Polyp of corpus uteri: Secondary | ICD-10-CM | POA: Diagnosis not present

## 2015-01-01 LAB — COMPREHENSIVE METABOLIC PANEL (CC13)
ALK PHOS: 114 U/L (ref 40–150)
ALT: 15 U/L (ref 0–55)
AST: 22 U/L (ref 5–34)
Albumin: 4 g/dL (ref 3.5–5.0)
Anion Gap: 7 mEq/L (ref 3–11)
BUN: 9.4 mg/dL (ref 7.0–26.0)
CO2: 30 mEq/L — ABNORMAL HIGH (ref 22–29)
Calcium: 9.7 mg/dL (ref 8.4–10.4)
Chloride: 103 mEq/L (ref 98–109)
Creatinine: 0.7 mg/dL (ref 0.6–1.1)
Glucose: 84 mg/dl (ref 70–140)
POTASSIUM: 3.5 meq/L (ref 3.5–5.1)
SODIUM: 140 meq/L (ref 136–145)
Total Bilirubin: 0.33 mg/dL (ref 0.20–1.20)
Total Protein: 7.5 g/dL (ref 6.4–8.3)

## 2015-01-01 LAB — CBC WITH DIFFERENTIAL/PLATELET
BASO%: 0.5 % (ref 0.0–2.0)
BASOS ABS: 0 10*3/uL (ref 0.0–0.1)
EOS ABS: 0.1 10*3/uL (ref 0.0–0.5)
EOS%: 2.4 % (ref 0.0–7.0)
HEMATOCRIT: 37.9 % (ref 34.8–46.6)
HGB: 12.9 g/dL (ref 11.6–15.9)
LYMPH#: 2.3 10*3/uL (ref 0.9–3.3)
LYMPH%: 50.4 % — ABNORMAL HIGH (ref 14.0–49.7)
MCH: 30.2 pg (ref 25.1–34.0)
MCHC: 34 g/dL (ref 31.5–36.0)
MCV: 88.7 fL (ref 79.5–101.0)
MONO#: 0.4 10*3/uL (ref 0.1–0.9)
MONO%: 8.1 % (ref 0.0–14.0)
NEUT#: 1.8 10*3/uL (ref 1.5–6.5)
NEUT%: 38.6 % (ref 38.4–76.8)
Platelets: 251 10*3/uL (ref 145–400)
RBC: 4.27 10*6/uL (ref 3.70–5.45)
RDW: 12.9 % (ref 11.2–14.5)
WBC: 4.7 10*3/uL (ref 3.9–10.3)

## 2015-01-01 LAB — TSH: TSH: 0.287 u[IU]/mL — ABNORMAL LOW (ref 0.350–4.500)

## 2015-01-01 NOTE — Telephone Encounter (Signed)
Appointments made and avs printed for patient °

## 2015-01-01 NOTE — Progress Notes (Signed)
ID: Brittney Vance OB: 01-02-1966  MR#: [id number]  PFX#:[id number]  PCP: Beatrice Lecher, MD GYN:  Veneda Melter SU: [name]; [name] OTHER MD: [place] COMPLAINT:Left breast cancer, triple negative  CURRENT THERAPY:  observation  BREAST CANCER HISTORY:  from the original intake note:  Brittney Vance had routine screening mammography 03/09/2013 showing heterogeneously dense breasts, and a possible asymmetry in the left breast. Diagnostic left mammography and ultrasonography at the [facility] Center 03/30/2013 found a lobulated mass in the left breast upper outer quadrant measuring 1.6 cm. This was palpable. Ultrasound showed a complex microlobulated hypoechoic mass measuring 1.8 cm. There was no left axillary lymphadenopathy noted.  Biopsy of the mass in question 04/06/2013 showed ([id number]) and invasive ductal carcinoma, grade 3, triple negative, with an MIB-1 of 65%.  Bilateral breast MRI 04/13/2013 found an irregular mass measuring 2.5 cm in the left breast with a few lymph nodes that showed moderate cortical thickness, the largest measuring 12 mm (level I).  The patient's subsequent history is as detailed below  INTERVAL HISTORY: Brittney Vance returns today for follow up of her left-sided breast cancer accompanied by her daughter Brittney Vance.  Brittney Vance is generally doing "fine". As far as breast cancer is concerned she does not report any symptoms suggestive of disease recurrence. She thinks her reconstruction is "fine" as well, although she says the nipples "flatten out" and it tattoos became a little bit fainter. She is satisfied however and really does not want any further work. She is not yet exercising regularly, but she plans to start a swimming program the summer and she is considering [employer].  Incidentally her husband Brittney Vance attended our first ever finding your new normal session for spouse's last week  REVIEW OF SYSTEMS: Brittney Vance to feel a little bit more fatigued than  she did before all this started and she also doesn't have the concentration she had before. She used to be able to multitask easily. Now things are slower and sometimes she forgets where she put things or doesn't finish the test before starting another. She underwent a workup for abdominal discomfort and was found to have a pedunculated uterine polyp and also some gallstones. She denies nausea or vomiting. Has been no intercurrent fever. A detailed review of systems today was otherwise stable.  PAST MEDICAL HISTORY: Past Medical History  Diagnosis Date  . Fibroids   . Hypertension   . Hypothyroid   . Depression 09/23/2014  . Breast cancer 2015    left, triple negative     PAST SURGICAL HISTORY: Past Surgical History  Procedure Laterality Date  . Tubal ligation    . Portacath placement Right 04/27/2013    Procedure: ULTRASOUND GUIDED PORT INSERTION WITH FLUOROSCOPY;  Surgeon: [name], MD;  Location: [facility];  Service: General;  Laterality: Right;  . Breast biopsy Left 04/24/2013  . Total mastectomy Bilateral 10/30/2013    Procedure: TOTAL MASTECTOMY;  Surgeon: [name], MD;  Location: [facility];  Service: General;  Laterality: Bilateral;  . Axillary lymph node biopsy Left 10/30/2013    Procedure: AXILLARY LYMPH NODE BIOPSY;  Surgeon: [name], MD;  Location: [facility];  Service: General;  Laterality: Left;  . Port-a-cath removal Right 10/30/2013    Procedure: REMOVAL PORT-A-CATH;  Surgeon: [name], MD;  Location: [facility];  Service: General;  Laterality: Right;  . Breast reconstruction with placement of tissue expander and flex hd (acellular hydrated dermis) Bilateral 10/30/2013    Procedure: BILATERAL BREAST RECONSTRUCTION  WITH PLACEMENT OF TISSUE EXPANDER AND FLEX HD (ACELLULAR HYDRATED DERMIS);  Surgeon: [name], MD;  Location: [facility];  Service: Plastics;  Laterality: Bilateral;    FAMILY HISTORY Family History  Problem Relation Age of Onset  . Hypertension  Mother   . Diabetes Mother   . Alzheimer's disease Mother   . Hypertension Father   . Diabetes Father   . Hypertension Brother   . Epilepsy Son 4    being worked up for autism   the patient's father died in his 18A from complications of diabetes. The patient's mother is living, in her mid 62s. The patient had 2 brothers, and 2 sisters. There is no history of cancer in the family to her knowledge.  GYNECOLOGIC HISTORY:    (updated 10/03/2013)  Menarche age 8, first live birth age 40. The patient is GX P2. She is postmenopausal, but did have some perimenopausal bleeding, which was evaluated age 16 08/14/2012 with a transabdominal and transvaginal ultrasound of the pelvis which found a normal uterine myometrium and left ovary. The right ovary could not be visualized.  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 She's currently out of work on disability due to her breast cancer and chemotherapy. Her husband Brittney Vance also works for the [employer] system. Their children are Brittney Vance, Brittney Vance, 5. Brittney Vance's mother currently lives in the home as well. The patient attends a [employer].   ADVANCED DIRECTIVES: Not in place   HEALTH MAINTENANCE: (Updated 10/03/2013) History  Substance Use Topics  . Smoking status: Never Smoker   . Smokeless tobacco: Never Used  . Alcohol Use: 1.2 oz/week    2 Glasses of wine per week     Comment: occassion     Colonoscopy:- Never  PAP: UTD 2015  Bone density: Never  Lipid panel: Dr. [name]    Allergies  Allergen Reactions  . Chocolate Itching  . Latex Itching  . Shrimp [Shellfish Allergy] Itching  . Wheat Bran Itching    Current Outpatient Prescriptions  Medication Sig Dispense Refill  . hydrochlorothiazide (HYDRODIURIL) 25 MG tablet TAKE 1 TABLET (25 MG TOTAL) BY MOUTH DAILY. 30 tablet 0  . SYNTHROID 88 MCG tablet TAKE 1 TABLET (88 MCG TOTAL) BY MOUTH DAILY BEFORE  BREAKFAST. 30 tablet 5   No current facility-administered medications for this visit.    OBJECTIVE:  Middle-aged African-American woman in no acute distress Filed Vitals:   01/01/15 0944  BP: 141/93  Pulse: 76  Temp: 97.9 F (36.6 C)  Resp: 18  Body mass index is 24.81 kg/(m^2).  ECOG: 1 Filed Weights   01/01/15 0944  Weight: 135 lb 11.2 oz (61.553 kg)   Sclerae unicteric, pupils round and equal Oropharynx clear and moist-- no thrush or other lesions No cervical or supraclavicular adenopathy Lungs no rales or rhonchi Heart regular rate and rhythm Abd soft, nontender, positive bowel sounds MSK no focal spinal tenderness, no upper extremity lymphedema Neuro: nonfocal, well oriented, appropriate affect Breasts: Status post bilateral mastectomies with implant reconstruction. There is no evidence of local recurrence. The cosmetic result is good. Though the nipples may not be as prominent as originally built, they are still there. Both axillae are benign.     LAB RESULTS:  Lab Results  Component Value Date   WBC 4.7 01/01/2015   NEUTROABS 1.8 01/01/2015   HGB 12.9 01/01/2015   HCT 37.9 01/01/2015   MCV 88.7 01/01/2015  PLT 251 01/01/2015      Chemistry      Component Value Date/Time   NA 140 09/23/2014 1527   NA 143 10/22/2013 1019   K 3.2* 09/23/2014 1527   K 4.1 10/22/2013 1019   CL 102 10/22/2013 1019   CO2 27 09/23/2014 1527   CO2 27 10/22/2013 1019   BUN 9.5 09/23/2014 1527   BUN 7 10/22/2013 1019   CREATININE 0.7 09/23/2014 1527   CREATININE 0.51 10/22/2013 1019   CREATININE 0.67 04/11/2013 0927      Component Value Date/Time   CALCIUM 9.9 09/23/2014 1527   CALCIUM 9.9 10/22/2013 1019   ALKPHOS 115 09/23/2014 1527   ALKPHOS 81 10/22/2013 1019   AST 22 09/23/2014 1527   AST 30 10/22/2013 1019   ALT 18 09/23/2014 1527   ALT 30 10/22/2013 1019   BILITOT 0.35 09/23/2014 1527   BILITOT 0.3 10/22/2013 1019       STUDIES: No results found. CLINICAL  DATA: Left breast cancer diagnosed in 2014. Chemotherapy completed 1 year ago. Left lower quadrant and groin pain for 1 month with nausea. Subsequent encounter.  EXAM: CT ABDOMEN AND PELVIS WITH CONTRAST  TECHNIQUE: Multidetector CT imaging of the abdomen and pelvis was performed using the standard protocol following bolus administration of intravenous contrast.  CONTRAST: 119m OMNIPAQUE IOHEXOL 300 MG/ML SOLN  COMPARISON: No prior CT. Prior pelvic ultrasounds are correlated, most recently do not 09/04/2014.  FINDINGS: Lower chest: Clear lung bases. No pleural or pericardial effusion. Bilateral breast implants and a small hiatal hernia noted.  Hepatobiliary: The liver is normal in density without focal abnormality. Several nitrogen containing gallstones are present. There is no gallbladder wall thickening, surrounding inflammatory change or biliary dilatation.  Pancreas: Unremarkable. No pancreatic ductal dilatation or surrounding inflammatory changes.  Spleen: Normal in size without focal abnormality.  Adrenals/Urinary Tract: Both adrenal glands appear normal.The kidneys appear normal without evidence of urinary tract calculus, suspicious lesion or hydronephrosis. No bladder abnormalities are seen.  Stomach/Bowel: No evidence of bowel wall thickening, distention or surrounding inflammatory change. Mild sigmoid colon diverticular changes without surrounding inflammatory change.No ascites or focal extraluminal fluid collection.  Vascular/Lymphatic: Small retroperitoneal and pelvic lymph nodes are present bilaterally. No pathologically enlarged lymph nodes are seen. No significant vascular findings are present.  Reproductive: There is a partially calcified mass in the superior right adnexa, measuring 2.8 x 3.7 cm transverse on image 52 and 3.4 cm cephalocaudad. This appears to be medial to a normal-appearing right ovary is seen on image 56. On the  reformatted images, there is suggestion of a thin stalk between this calcified mass and the adjacent uterus, suggesting a pedunculated fibroid. The uterus otherwise appears normal.  Other: No evidence of abdominal wall mass or hernia.  Musculoskeletal: No acute or significant osseous findings. Mild osteitis pubis and mild degenerative changes of the sacroiliac joints noted. There is a convex left lumbar scoliosis.  IMPRESSION: 1. No acute findings or definite evidence of metastatic disease. 2. Calcified superior right adnexal mass appears separate from the right ovary and may reflect a pedunculated partially calcified uterine fibroid. This has not been documented on the reviewed prior pelvic ultrasounds. This could be confirmed with pelvic MRI if clinically warranted. This would be an unusual appearance for metastatic disease. 3. Cholelithiasis. 4. Mild sigmoid diverticulosis.   Electronically Signed  By: WRichardean SaleM.D.  On: 09/27/2014 10:11    CLINICAL DATA: Calcified mass seen on prior CT. Look for stalk to confirm fibroid.  EXAM: TRANSABDOMINAL AND TRANSVAGINAL ULTRASOUND OF PELVIS  TECHNIQUE: Both transabdominal and transvaginal ultrasound examinations of the pelvis were performed. Transabdominal technique was performed for global imaging of the pelvis including uterus, ovaries, adnexal regions, and pelvic cul-de-sac. It was necessary to proceed with endovaginal exam following the transabdominal exam to visualize the uterus, endometrium, ovaries and adnexa .  COMPARISON: CT performed 09/27/2014  FINDINGS: Uterus  Measurements: 6.3 x 3.4 x 4.4 cm. No intramural or submucosal fibroids.  Endometrium  Thickness: 2 mm in thickness. No focal abnormality visualized.  Right ovary  Measurements: 1.9 x 1.0 x 0.6 cm. Normal appearance/no adnexal mass.  Left ovary  Measurements: 2.2 x 1.2 x 0.7 cm. Normal appearance/no adnexal  mass.  Other findings  No free fluid. Superior to the uterus, there is a calcified mass measuring 3.8 x 2.9 x 2.8 cm corresponding to the calcified mass seen on prior CT. On transvaginal imaging, a stalk could be visualized measuring 1.9 cm in length and 7 mm in thickness. Findings compatible with pedunculated calcified fibroid.  IMPRESSION: Ultrasound confirms a stalk leading to the calcified mass in the pelvis as seen on prior CT. Findings compatible with pedunculated fibroid.  These results were discussed with the patient and Dr. Gala Romney at at the time of the procedure.   Electronically Signed  By: Rolm Baptise M.D.  On: 10/01/2014 13:30   ASSESSMENT: 49 y.o. BRCA negative [place] woman   (1)  status post left breast upper outer quadrant biopsy 04/06/2013 for a clinical T2 N1, stage IIB invasive ductal carcinoma, high-grade, triple negative, with an MIB-1 of 65%  (2) left axillary lymph node biopsy 04/24/2013 negative for malignancy  (3)  treated in the neoadjuvant setting:   (a) completed 4 dose dense cycles of doxorubicin/ cyclophosphamide 06/11/2013,.  (b)  completed 12 weekly cycles of carboplatin/ paclitaxel 09/24/2013  (4)  status post bilateral mastectomies 10/30/2013, showing benign results on the right and a complete pathologic response in the left, including both sentinel lymph nodes clear  (5) status post bilateral breast implant reconstructive surgery  PLAN: Lihanna is now a little more than a year out from her definitive surgery with no evidence of disease recurrence. This is very favorable.  We discussed the cognitive dysfunction that can follow a diagnosis of breast cancer, whether or not patients receive chemotherapy. She is certainly experiencing that at present. One thing that may help his exercise. [facility] [employer] [facility] and [facility] a [facility] program the summer as well.  She is can see [place] again in 3 months.  She will then see her primary care physician in November and her gynecologist in March. Her next appointment with [place] after September, then, will be June 2017. Of course she knows to call for any problems that may develop before her next visit here.     Chauncey Cruel, MD   01/01/2015 9:51 AM

## 2015-01-02 LAB — COMPLETE METABOLIC PANEL WITH GFR

## 2015-01-02 LAB — LIPID PANEL

## 2015-01-08 ENCOUNTER — Telehealth: Payer: Self-pay | Admitting: Family Medicine

## 2015-01-08 NOTE — Telephone Encounter (Signed)
Call pt: thyroid level was too suppressed.  Cut one of her thyroid pills in half and take one day a week and then take whole tab all other days.  Then recheck thyroid in 8 weeks.  They didn't run her cholesterol so this will need to be checked at her next appt.

## 2015-01-08 NOTE — Telephone Encounter (Signed)
Called to inform pt of recommendations. She would like for this to be emailed to her at [email or web address].  email sent.[employer] [place]

## 2015-01-17 ENCOUNTER — Other Ambulatory Visit: Payer: Self-pay | Admitting: Family Medicine

## 2015-01-18 ENCOUNTER — Other Ambulatory Visit: Payer: Self-pay | Admitting: Family Medicine

## 2015-01-19 ENCOUNTER — Encounter: Payer: Self-pay | Admitting: Oncology

## 2015-01-20 ENCOUNTER — Other Ambulatory Visit: Payer: Self-pay | Admitting: *Deleted

## 2015-01-20 DIAGNOSIS — E038 Other specified hypothyroidism: Secondary | ICD-10-CM

## 2015-01-20 DIAGNOSIS — Z0000 Encounter for general adult medical examination without abnormal findings: Secondary | ICD-10-CM

## 2015-01-20 DIAGNOSIS — C50412 Malignant neoplasm of upper-outer quadrant of left female breast: Secondary | ICD-10-CM

## 2015-02-06 ENCOUNTER — Encounter: Payer: Self-pay | Admitting: Endocrinology

## 2015-02-06 ENCOUNTER — Ambulatory Visit
Admission: RE | Admit: 2015-02-06 | Discharge: 2015-02-06 | Disposition: A | Discharge status: Home / Self Care | Payer: BC Managed Care – PPO | Source: Ambulatory Visit | Attending: Endocrinology | Admitting: Endocrinology

## 2015-02-06 ENCOUNTER — Ambulatory Visit (INDEPENDENT_AMBULATORY_CARE_PROVIDER_SITE_OTHER): Payer: BC Managed Care – PPO | Admitting: Endocrinology

## 2015-02-06 VITALS — BP 132/74 | HR 98 | Temp 98.0°F | Ht 62.0 in | Wt 139.0 lb

## 2015-02-06 DIAGNOSIS — E049 Nontoxic goiter, unspecified: Secondary | ICD-10-CM

## 2015-02-06 DIAGNOSIS — E038 Other specified hypothyroidism: Secondary | ICD-10-CM

## 2015-02-06 MED ORDER — LEVOTHYROXINE SODIUM 75 MCG PO TABS: 75.0000 ug | ORAL_TABLET | Freq: Every day | ORAL | Status: DC

## 2015-02-06 NOTE — Patient Instructions (Addendum)
Let's check an ultrasound.  Please reduce the thyroid medication.  i have sent a prescription to your pharmacy.   Please recheck the blood test in 4-6 weeks, here in the office.       Hypothyroidism The thyroid is a large gland located in the lower front of your neck. The thyroid gland helps control metabolism. Metabolism is how your body handles food. It controls metabolism with the hormone thyroxine. When this gland is underactive (hypothyroid), it produces too little hormone.  CAUSES These include:   Absence or destruction of thyroid tissue.  Goiter due to iodine deficiency.  Goiter due to medications.  Congenital defects (since birth).  Problems with the pituitary. This causes a lack of TSH (thyroid stimulating hormone). This hormone tells the thyroid to turn out more hormone. SYMPTOMS  Lethargy (feeling as though you have no energy)  Cold intolerance  Weight gain (in spite of normal food intake)  Dry skin  Coarse hair  Menstrual irregularity (if severe, may lead to infertility)  Slowing of thought processes Cardiac problems are also caused by insufficient amounts of thyroid hormone. Hypothyroidism in the newborn is cretinism, and is an extreme form. It is important that this form be treated adequately and immediately or it will lead rapidly to retarded physical and mental development. DIAGNOSIS  To prove hypothyroidism, your caregiver may do blood tests and ultrasound tests. Sometimes the signs are hidden. It may be necessary for your caregiver to watch this illness with blood tests either before or after diagnosis and treatment. TREATMENT  Low levels of thyroid hormone are increased by using synthetic thyroid hormone. This is a safe, effective treatment. It usually takes about four weeks to gain the full effects of the medication. After you have the full effect of the medication, it will generally take another four weeks for problems to leave. Your caregiver may start  you on low doses. If you have had heart problems the dose may be gradually increased. It is generally not an emergency to get rapidly to normal. HOME CARE INSTRUCTIONS   Take your medications as your caregiver suggests. Let your caregiver know of any medications you are taking or start taking. Your caregiver will help you with dosage schedules.  As your condition improves, your dosage needs may increase. It will be necessary to have continuing blood tests as suggested by your caregiver.  Report all suspected medication side effects to your caregiver. SEEK MEDICAL CARE IF: Seek medical care if you develop:  Sweating.  Tremulousness (tremors).  Anxiety.  Rapid weight loss.  Heat intolerance.  Emotional swings.  Diarrhea.  Weakness. SEEK IMMEDIATE MEDICAL CARE IF:  You develop chest pain, an irregular heart beat (palpitations), or a rapid heart beat. MAKE SURE YOU:   Understand these instructions.  Will watch your condition.  Will get help right away if you are not doing well or get worse. Document Released: 07/12/2005 Document Revised: 10/04/2011 Document Reviewed: 03/01/2008 [facility] Patient Information 2015 [place], [place]. This information is not intended to replace advice given to you by your health care provider. Make sure you discuss any questions you have with your health care provider.

## 2015-02-06 NOTE — Progress Notes (Signed)
Subjective:    Patient ID: Brittney Vance, female    DOB: September 17, 1965, 49 y.o.   MRN: [id number]  HPI Pt reports hypothyroidism was dx'ed in 2006, in the postpartum state.  she has been on prescribed thyroid hormone therapy since then.  she has never taken kelp or any other type of non-prescribed thyroid product.  she has never had thyroid imaging.  She has never had thyroid surgery, or XRT to the neck.  He has never been on amiodarone or lithium.  She has been on her current 88 mcg/day, for several years.  She finished chemotx in early 2015.   Past Medical History  Diagnosis Date  . Fibroids   . Hypertension   . Hypothyroid   . Depression 09/23/2014  . Breast cancer 2015    left, triple negative     Past Surgical History  Procedure Laterality Date  . Tubal ligation    . Portacath placement Right 04/27/2013    Procedure: ULTRASOUND GUIDED PORT INSERTION WITH FLUOROSCOPY;  Surgeon: [name], MD;  Location: [facility];  Service: General;  Laterality: Right;  . Breast biopsy Left 04/24/2013  . Total mastectomy Bilateral 10/30/2013    Procedure: TOTAL MASTECTOMY;  Surgeon: [name], MD;  Location: [place];  Service: General;  Laterality: Bilateral;  . Axillary lymph node biopsy Left 10/30/2013    Procedure: AXILLARY LYMPH NODE BIOPSY;  Surgeon: [name], MD;  Location: [place];  Service: General;  Laterality: Left;  . Port-a-cath removal Right 10/30/2013    Procedure: REMOVAL PORT-A-CATH;  Surgeon: [name], MD;  Location: [place];  Service: General;  Laterality: Right;  . Breast reconstruction with placement of tissue expander and flex hd (acellular hydrated dermis) Bilateral 10/30/2013    Procedure: BILATERAL BREAST RECONSTRUCTION WITH PLACEMENT OF TISSUE EXPANDER AND FLEX HD (ACELLULAR HYDRATED DERMIS);  Surgeon: [name], MD;  Location: [place];  Service: Plastics;  Laterality: Bilateral;    History   Social History  . Marital Status: Married    Spouse Name: [name]     . Number of Children: 2  . Years of Education: Masters    Occupational History  . media specialist      [place]   Social History Main Topics  . Smoking status: Never Smoker   . Smokeless tobacco: Never Used  . Alcohol Use: 1.2 oz/week    2 Glasses of wine per week     Comment: occassion  . Drug Use: No  . Sexual Activity:    Partners: Male    Birth Control/ Protection: Post-menopausal     Comment: menarche age 5, first birth age 85, P2, post menopausal   Other Topics Concern  . Not on file   Social History Narrative   1-2 caffeine drinks per day. No regular exercise.    No current outpatient prescriptions on file prior to visit.   No current facility-administered medications on file prior to visit.    Allergies  Allergen Reactions  . Chocolate Itching  . Latex Itching  . Shrimp [Shellfish Allergy] Itching  . Wheat Bran Itching    Family History  Problem Relation Age of Onset  . Hypertension Mother   . Diabetes Mother   . Alzheimer's disease Mother   . Hypertension Father   . Diabetes Father   . Hypertension Brother   . Epilepsy Son 4    being worked up for autism  . Thyroid disease Neg Hx     BP  132/74 mmHg  Pulse 98  Temp(Src) 98 F (36.7 C) (Oral)  Ht 5\' 2"  (1.575 m)  Wt 139 lb (63.05 kg)  BMI 25.42 kg/m2  SpO2 99%  LMP 03/10/2013  Review of Systems denies depression, muscle cramps, sob, weight gain, constipation, numbness, blurry vision, cold intolerance, dry skin, rhinorrhea, easy bruising, and syncope.      Objective:   Physical Exam VS: see vs page GEN: no distress HEAD: head: no deformity eyes: no periorbital swelling, no proptosis external nose and ears are normal mouth: no lesion seen NECK: thyroid is several times normal size, with irregular surface (R>L).   CHEST WALL: no deformity LUNGS:  Clear to auscultation.   CV: reg rate and rhythm, no murmur.  ABD: abdomen is soft, nontender.  no hepatosplenomegaly.  not distended.  no  hernia MUSCULOSKELETAL: muscle bulk and strength are grossly normal.  no obvious joint swelling.  gait is normal and steady EXTEMITIES: no deformity.  no ulcer on the feet.  feet are of normal color and temp.  no edema PULSES: dorsalis pedis intact bilat.  no carotid bruit NEURO:  cn 2-12 grossly intact.   readily moves all 4's.  sensation is intact to touch on the feet SKIN:  Normal texture and temperature.  No rash or suspicious lesion is visible.   NODES:  None palpable at the neck PSYCH: alert, well-oriented.  Does not appear anxious nor depressed.   Lab Results  Component Value Date   TSH 0.287* 01/01/2015   i reviewed outside records: phone message of 01/08/15: pt was advised: thyroid level was too suppressed. Cut one of her thyroid pills in half and take one day a week and then take whole tab all other days. Then recheck thyroid in 8 weeks (however, pt did not do this)    Assessment & Plan:  Hypothyroidism: she needs reduced rx.  The reason for the fluctuating dosage requirement is usually unclear. Multinodular goiter, new to me, prob due to chronic thyroiditis.      Patient is advised the following: Patient Instructions  Let's check an ultrasound.  Please reduce the thyroid medication.  i have sent a prescription to your pharmacy.   Please recheck the blood test in 4-6 weeks, here in the office.       Hypothyroidism The thyroid is a large gland located in the lower front of your neck. The thyroid gland helps control metabolism. Metabolism is how your body handles food. It controls metabolism with the hormone thyroxine. When this gland is underactive (hypothyroid), it produces too little hormone.  CAUSES These include:   Absence or destruction of thyroid tissue.  Goiter due to iodine deficiency.  Goiter due to medications.  Congenital defects (since birth).  Problems with the pituitary. This causes a lack of TSH (thyroid stimulating hormone). This hormone tells the  thyroid to turn out more hormone. SYMPTOMS  Lethargy (feeling as though you have no energy)  Cold intolerance  Weight gain (in spite of normal food intake)  Dry skin  Coarse hair  Menstrual irregularity (if severe, may lead to infertility)  Slowing of thought processes Cardiac problems are also caused by insufficient amounts of thyroid hormone. Hypothyroidism in the newborn is cretinism, and is an extreme form. It is important that this form be treated adequately and immediately or it will lead rapidly to retarded physical and mental development. DIAGNOSIS  To prove hypothyroidism, your caregiver may do blood tests and ultrasound tests. Sometimes the signs are hidden. It may  be necessary for your caregiver to watch this illness with blood tests either before or after diagnosis and treatment. TREATMENT  Low levels of thyroid hormone are increased by using synthetic thyroid hormone. This is a safe, effective treatment. It usually takes about four weeks to gain the full effects of the medication. After you have the full effect of the medication, it will generally take another four weeks for problems to leave. Your caregiver may start you on low doses. If you have had heart problems the dose may be gradually increased. It is generally not an emergency to get rapidly to normal. HOME CARE INSTRUCTIONS   Take your medications as your caregiver suggests. Let your caregiver know of any medications you are taking or start taking. Your caregiver will help you with dosage schedules.  As your condition improves, your dosage needs may increase. It will be necessary to have continuing blood tests as suggested by your caregiver.  Report all suspected medication side effects to your caregiver. SEEK MEDICAL CARE IF: Seek medical care if you develop:  Sweating.  Tremulousness (tremors).  Anxiety.  Rapid weight loss.  Heat intolerance.  Emotional swings.  Diarrhea.  Weakness. SEEK IMMEDIATE  MEDICAL CARE IF:  You develop chest pain, an irregular heart beat (palpitations), or a rapid heart beat. MAKE SURE YOU:   Understand these instructions.  Will watch your condition.  Will get help right away if you are not doing well or get worse. Document Released: 07/12/2005 Document Revised: 10/04/2011 Document Reviewed: 03/01/2008 [facility] Patient Information 2015 [place], [place]. This information is not intended to replace advice given to you by your health care provider. Make sure you discuss any questions you have with your health care provider.

## 2015-03-04 ENCOUNTER — Ambulatory Visit: Payer: BC Managed Care – PPO | Admitting: Internal Medicine

## 2015-04-03 ENCOUNTER — Telehealth: Payer: Self-pay | Admitting: Oncology

## 2015-04-03 ENCOUNTER — Telehealth: Payer: Self-pay

## 2015-04-03 NOTE — Telephone Encounter (Signed)
Called patient and she is aware of her new appointment per the pof.

## 2015-04-03 NOTE — Telephone Encounter (Signed)
Writer called patient back and wanted to know if she could have a perm.  She was last treated with chemotherapy on 09/2013.  Writer encouraged her to have the perm- patient was concerned that it might be bad for the new hair growth.

## 2015-04-10 ENCOUNTER — Other Ambulatory Visit: Payer: BC Managed Care – PPO

## 2015-04-10 ENCOUNTER — Encounter: Payer: BC Managed Care – PPO | Admitting: Oncology

## 2015-04-25 ENCOUNTER — Other Ambulatory Visit: Payer: Self-pay

## 2015-04-25 DIAGNOSIS — C50412 Malignant neoplasm of upper-outer quadrant of left female breast: Secondary | ICD-10-CM

## 2015-04-28 ENCOUNTER — Telehealth: Payer: Self-pay | Admitting: Oncology

## 2015-04-28 ENCOUNTER — Ambulatory Visit (HOSPITAL_BASED_OUTPATIENT_CLINIC_OR_DEPARTMENT_OTHER): Payer: BC Managed Care – PPO | Admitting: Oncology

## 2015-04-28 ENCOUNTER — Telehealth: Payer: Self-pay | Admitting: Endocrinology

## 2015-04-28 ENCOUNTER — Other Ambulatory Visit (HOSPITAL_BASED_OUTPATIENT_CLINIC_OR_DEPARTMENT_OTHER): Payer: BC Managed Care – PPO

## 2015-04-28 ENCOUNTER — Other Ambulatory Visit: Payer: Self-pay | Admitting: Endocrinology

## 2015-04-28 VITALS — BP 151/91 | HR 70 | Temp 98.9°F | Resp 18 | Ht 62.0 in | Wt 138.7 lb

## 2015-04-28 DIAGNOSIS — Z171 Estrogen receptor negative status [ER-]: Secondary | ICD-10-CM

## 2015-04-28 DIAGNOSIS — C50412 Malignant neoplasm of upper-outer quadrant of left female breast: Secondary | ICD-10-CM

## 2015-04-28 LAB — COMPREHENSIVE METABOLIC PANEL (CC13)
ALBUMIN: 4 g/dL (ref 3.5–5.0)
ALK PHOS: 118 U/L (ref 40–150)
ALT: 20 U/L (ref 0–55)
AST: 23 U/L (ref 5–34)
Anion Gap: 6 mEq/L (ref 3–11)
BUN: 9.9 mg/dL (ref 7.0–26.0)
CO2: 27 mEq/L (ref 22–29)
CREATININE: 0.8 mg/dL (ref 0.6–1.1)
Calcium: 9.5 mg/dL (ref 8.4–10.4)
Chloride: 108 mEq/L (ref 98–109)
EGFR: >90 mL/min/{1.73_m2} (ref >90)
Glucose: 88 mg/dl (ref 70–140)
POTASSIUM: 3.6 meq/L (ref 3.5–5.1)
Sodium: 141 mEq/L (ref 136–145)
Total Bilirubin: 0.3 mg/dL (ref 0.20–1.20)
Total Protein: 7.5 g/dL (ref 6.4–8.3)

## 2015-04-28 LAB — CBC WITH DIFFERENTIAL/PLATELET
BASO%: 0.7 % (ref 0.0–2.0)
BASOS ABS: 0 10*3/uL (ref 0.0–0.1)
EOS%: 3.3 % (ref 0.0–7.0)
Eosinophils Absolute: 0.2 10*3/uL (ref 0.0–0.5)
HEMATOCRIT: 38.4 % (ref 34.8–46.6)
HGB: 13.2 g/dL (ref 11.6–15.9)
LYMPH#: 2.4 10*3/uL (ref 0.9–3.3)
LYMPH%: 53 % — ABNORMAL HIGH (ref 14.0–49.7)
MCH: 30.8 pg (ref 25.1–34.0)
MCHC: 34.4 g/dL (ref 31.5–36.0)
MCV: 89.7 fL (ref 79.5–101.0)
MONO#: 0.4 10*3/uL (ref 0.1–0.9)
MONO%: 7.8 % (ref 0.0–14.0)
NEUT#: 1.6 10*3/uL (ref 1.5–6.5)
NEUT%: 35.2 % — AB (ref 38.4–76.8)
Platelets: 243 10*3/uL (ref 145–400)
RBC: 4.28 10*6/uL (ref 3.70–5.45)
RDW: 13.1 % (ref 11.2–14.5)
WBC: 4.5 10*3/uL (ref 3.9–10.3)

## 2015-04-28 LAB — TSH: TSH: 1.057 u[IU]/mL (ref 0.350–4.500)

## 2015-04-28 NOTE — Telephone Encounter (Signed)
Patient would like for you to send lab order over to Dr [name] Hakim office over at [facility], to be drawn there fax # [phone]

## 2015-04-28 NOTE — Telephone Encounter (Signed)
Appointments made and avs printed for patient °

## 2015-04-28 NOTE — Telephone Encounter (Signed)
Already in epic

## 2015-04-28 NOTE — Telephone Encounter (Signed)
See note below and please advise what lab tests need to be ordered. Thanks!

## 2015-04-28 NOTE — Telephone Encounter (Signed)
Orders faxed per pt's request:

## 2015-04-28 NOTE — Progress Notes (Signed)
ID: Brittney Vance OB: [birth year]  MR#: [id number]  HGD#:[id number]  PCP: No primary care provider on file. GYN:  Brittney Vance SU: Brittney Vance; Brittney Vance OTHER MD: Brittney Vance. Brittney Vance  CHIEF COMPLAINT:Left breast cancer, triple negative  CURRENT THERAPY:  observation  BREAST CANCER HISTORY:  from the original intake note:  Brittney Vance had routine screening mammography 03/09/2013 showing heterogeneously dense breasts, and a possible asymmetry in the left breast. Diagnostic left mammography and ultrasonography at the breast Center 03/30/2013 found a lobulated mass in the left breast upper outer quadrant measuring 1.6 cm. This was palpable. Ultrasound showed a complex microlobulated hypoechoic mass measuring 1.8 cm. There was no left axillary lymphadenopathy noted.  Biopsy of the mass in question 04/06/2013 showed ([id number]) and invasive ductal carcinoma, grade 3, triple negative, with an MIB-1 of 65%.  Bilateral breast MRI 04/13/2013 found an irregular mass measuring 2.5 cm in the left breast with a few lymph nodes that showed moderate cortical thickness, the largest measuring 12 mm (level I).  The patient's subsequent history is as detailed below  INTERVAL HISTORY: Brittney Vance returns today for follow up of her left-sided breast cancer. Overall she is doing "okay" in particular her cognitive dysfunction has improved. Even though she is not back to normal yet as far as that goes, she was able to return to work November 2016. She "can't do the job". She still has a hard time multitasking. Of course she is off for the summer months and we'll be resuming work in August.  REVIEW OF SYSTEMS: Brittney Vance notes that her right big toenail still is dark and still is loose. It has not ", off" but she is afraid it might. The other toes are fine. She has night sweats and hot flashes. She has rare headaches. Again the forgetfulness while better is not entirely resolved. A detailed review of systems today was  otherwise stable  PAST MEDICAL HISTORY: Past Medical History  Diagnosis Date  . Fibroids   . Hypertension   . Hypothyroid   . Depression 09/23/2014  . Breast cancer 2015    left, triple negative     PAST SURGICAL HISTORY: Past Surgical History  Procedure Laterality Date  . Tubal ligation    . Portacath placement Right 04/27/2013    Procedure: ULTRASOUND GUIDED PORT INSERTION WITH FLUOROSCOPY;  Surgeon: Brittney Hollingshead, MD;  Location[facility];  Service: General;  Laterality: Right;  . Breast biopsy Left 04/24/2013  . Total mastectomy Bilateral 10/30/2013    Procedure: TOTAL MASTECTOMY;  Surgeon: Brittney Hollingshead, MD;  Location: [place];  Service: General;  Laterality: Bilateral;  . Axillary lymph node biopsy Left 10/30/2013    Procedure: AXILLARY LYMPH NODE BIOPSY;  Surgeon: Brittney Hollingshead, MD;  Location: [place];  Service: General;  Laterality: Left;  . Port-a-cath removal Right 10/30/2013    Procedure: REMOVAL PORT-A-CATH;  Surgeon: Brittney Hollingshead, MD;  Location: [place];  Service: General;  Laterality: Right;  . Breast reconstruction with placement of tissue expander and flex hd (acellular hydrated dermis) Bilateral 10/30/2013    Procedure: BILATERAL BREAST RECONSTRUCTION WITH PLACEMENT OF TISSUE EXPANDER AND FLEX HD (ACELLULAR HYDRATED DERMIS);  Surgeon: Brittney Reese, MD;  Location: [place];  Service: Plastics;  Laterality: Bilateral;    FAMILY HISTORY Family History  Problem Relation Age of Onset  . Hypertension Mother   . Diabetes Mother   . Alzheimer's disease Mother   . Hypertension Father   . Diabetes Father   .  Hypertension Brother   . Epilepsy Son 4    being worked up for autism  . Thyroid disease Neg Hx    the patient's father died in his 79K from complications of diabetes. The patient's mother is living, in her mid 37s. The patient had 2 brothers, and 2 sisters. There is no history of cancer in the family to her knowledge.  GYNECOLOGIC HISTORY:    (updated 10/03/2013)   Menarche age 56, first live birth age 26. The patient is GX P2. She is postmenopausal, but did have some perimenopausal bleeding, which was evaluated age 76 08/14/2012 with a transabdominal and transvaginal ultrasound of the pelvis which found a normal uterine myometrium and left ovary. The right ovary could not be visualized.  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 Her husband Brittney Vance also works for the [employer] system. Their children are Brittney Vance, Brittney Vance, 5. Brittney Vance's mother currently lives in the home as well. The patient attends a [employer].   ADVANCED DIRECTIVES: Not in place   HEALTH MAINTENANCE: (Updated 10/03/2013) Social History  Substance Use Topics  . Smoking status: Never Smoker   . Smokeless tobacco: Never Used  . Alcohol Use: 1.2 oz/week    2 Glasses of wine per week     Comment: occassion     Colonoscopy:- Never  PAP: UTD 2015  Bone density: Never  Lipid panel:     Allergies  Allergen Reactions  . Chocolate Itching  . Latex Itching  . Shrimp [Shellfish Allergy] Itching  . Wheat Bran Itching    Current Outpatient Prescriptions  Medication Sig Dispense Refill  . levothyroxine (SYNTHROID, LEVOTHROID) 75 MCG tablet Take 1 tablet (75 mcg total) by mouth daily before breakfast. 30 tablet 11   No current facility-administered medications for this visit.    OBJECTIVE:  Middle-aged African-American woman who appears stated age 49 Vitals:   04/28/15 0918  BP: 151/91  Pulse: 70  Temp: 98.9 F (37.2 C)  Resp: 18  Body mass index is 25.36 kg/(m^2).  ECOG: 1 Filed Weights   04/28/15 0918  Weight: 138 lb 11.2 oz (62.914 kg)   patient's home blood pressure readings are in the 115/80 range  Sclerae unicteric, EOMs intact Oropharynx clear and moist No cervical or supraclavicular adenopathy Lungs no rales or rhonchi Heart regular rate and rhythm Abd soft, nontender,  positive bowel sounds MSK no focal spinal tenderness, no upper extremity lymphedema Neuro: nonfocal, well oriented, appropriate affect Breasts: Status post bilateral mastectomy with bilateral implant reconstruction. The cosmetic result is good. There is no evidence of local recurrence. Both axillae are benign.   LAB RESULTS:  Lab Results  Component Value Date   WBC 4.5 04/28/2015   NEUTROABS 1.6 04/28/2015   HGB 13.2 04/28/2015   HCT 38.4 04/28/2015   MCV 89.7 04/28/2015   PLT 243 04/28/2015      Chemistry      Component Value Date/Time   NA 141 04/28/2015 0855   NA CANCELED 01/01/2015 0912   K 3.6 04/28/2015 0855   K CANCELED 01/01/2015 0912   CL CANCELED 01/01/2015 0912   CO2 27 04/28/2015 0855   CO2 CANCELED 01/01/2015 0912   BUN 9.9 04/28/2015 0855   BUN CANCELED 01/01/2015 0912   CREATININE 0.8 04/28/2015 0855   CREATININE CANCELED 01/01/2015 0912   CREATININE 0.51 10/22/2013 1019      Component Value Date/Time   CALCIUM 9.5 04/28/2015  0855   CALCIUM CANCELED 01/01/2015 0912   ALKPHOS 118 04/28/2015 0855   ALKPHOS CANCELED 01/01/2015 0912   AST 23 04/28/2015 0855   AST CANCELED 01/01/2015 0912   ALT 20 04/28/2015 0855   ALT CANCELED 01/01/2015 0912   BILITOT 0.30 04/28/2015 0855   BILITOT CANCELED 01/01/2015 0912       STUDIES:  Study Result     CLINICAL DATA: 49 year old female with a history of goiter  EXAM: THYROID ULTRASOUND  TECHNIQUE: Ultrasound examination of the thyroid gland and adjacent soft tissues was performed.  COMPARISON: None.  FINDINGS: Right thyroid lobe  Measurements: 4.3 cm x 2.5 cm x 2.1 cm. Single right-sided nodule measuring 9 mm x 7 mm x 9 mm. No internal reflectors.  Relatively heterogeneous appearance of right thyroid tissue.  Left thyroid lobe  Measurements: 4.0 cm x 2.1 cm x 2.0 cm. Heterogeneous appearance of left thyroid tissue.  Isthmus  Thickness: 4 mm. No nodules  visualized.  Lymphadenopathy  None visualized.  IMPRESSION: Heterogeneous thyroid with single right-sided thyroid nodule.  Findings do not meet current SRU consensus criteria for biopsy. Follow-up by clinical exam is recommended. If patient has known risk factors for thyroid carcinoma, consider follow-up ultrasound in 12 months. If patient is clinically hyperthyroid, consider nuclear medicine thyroid uptake and scan.Reference: Management of Thyroid Nodules Detected at [place]: Society of Radiologists in [place]. Radiology 2005; [id number].  Signed,  Dulcy Fanny. Earleen Newport, DO  [facility]  [facility]   Electronically Signed  By: Corrie Mckusick D.O.  On: 02/06/2015 16:56       ASSESSMENT: 49 y.o. BRCA negative [place] woman   (1)  status post left breast upper outer quadrant biopsy 04/06/2013 for a clinical T2 N1, stage IIB invasive ductal carcinoma, high-grade, triple negative, with an MIB-1 of 65%  (2) left axillary lymph node biopsy 04/24/2013 negative for malignancy  (3)  treated in the neoadjuvant setting:   (a) completed 4 dose dense cycles of doxorubicin/ cyclophosphamide 06/11/2013,.  (b)  completed 12 weekly cycles of carboplatin/ paclitaxel 09/24/2013  (4)  status post bilateral mastectomies 10/30/2013, showing benign results on the right and a complete pathologic response in the left, including both sentinel lymph nodes clear  (5) status post bilateral breast implant reconstructive surgery  (6) pedunculated fibroid noted March 2016  (7) Right thyroid nodule noted July 2016  PLAN: Ammy is now a year and a half out from her definitive surgery with no evidence of disease recurrence. This is very favorable.  She lost some work because of studies that we did on February and March of this year and I am writing her a letter so she can appeal.  She will see [place] again in  February. She will then see Dr. Gala Romney in June, and then see [place] again in October. We will keep "tag team in her" in that fashion until she completes her 5 years of follow-up.  She has joined [facility] [employer] and I encouraged her to try to get in between 30 and 45 minutes 5 days a week if at all possible.  She has a good understanding of this plan. She knows to call for any problems that may develop before her next visit here.  Chauncey Cruel, MD   04/28/2015 9:46 AM

## 2015-04-30 ENCOUNTER — Other Ambulatory Visit: Payer: Self-pay | Admitting: Oncology

## 2015-04-30 DIAGNOSIS — C50921 Malignant neoplasm of unspecified site of right male breast: Secondary | ICD-10-CM

## 2015-05-02 ENCOUNTER — Telehealth: Payer: Self-pay | Admitting: Endocrinology

## 2015-05-02 NOTE — Telephone Encounter (Signed)
please call patient: Thyroid is normal Please continue the same medication

## 2015-05-05 ENCOUNTER — Other Ambulatory Visit: Payer: Self-pay

## 2015-05-05 DIAGNOSIS — E038 Other specified hypothyroidism: Secondary | ICD-10-CM

## 2015-05-05 NOTE — Telephone Encounter (Signed)
I contacted the pt and advised of note below. Pt voiced understanding and stated she would like for labs to be rechecked by [place] in 6 months. Orders for future lab work has been placed.

## 2015-05-05 NOTE — Telephone Encounter (Signed)
Yes, here are your options: come back for a follow-up appointment in 6 months. Ask your PCP to check, and come back here as needed.

## 2015-05-05 NOTE — Telephone Encounter (Signed)
I contacted the pt and advised of note below. Patient voiced understanding and wanted to know when she should have her blood test rechecked. Please advise, Thanks!

## 2015-05-23 ENCOUNTER — Ambulatory Visit: Payer: BC Managed Care – PPO | Admitting: Family Medicine

## 2015-09-01 ENCOUNTER — Telehealth: Payer: Self-pay | Admitting: Oncology

## 2015-09-01 ENCOUNTER — Encounter: Payer: Self-pay | Admitting: Nurse Practitioner

## 2015-09-01 ENCOUNTER — Ambulatory Visit (HOSPITAL_BASED_OUTPATIENT_CLINIC_OR_DEPARTMENT_OTHER): Payer: BC Managed Care – PPO | Admitting: Nurse Practitioner

## 2015-09-01 ENCOUNTER — Other Ambulatory Visit (HOSPITAL_BASED_OUTPATIENT_CLINIC_OR_DEPARTMENT_OTHER): Payer: BC Managed Care – PPO

## 2015-09-01 VITALS — BP 135/87 | HR 78 | Temp 98.2°F | Resp 18 | Wt 139.6 lb

## 2015-09-01 DIAGNOSIS — C50412 Malignant neoplasm of upper-outer quadrant of left female breast: Secondary | ICD-10-CM

## 2015-09-01 DIAGNOSIS — Z853 Personal history of malignant neoplasm of breast: Secondary | ICD-10-CM

## 2015-09-01 DIAGNOSIS — C50921 Malignant neoplasm of unspecified site of right male breast: Secondary | ICD-10-CM

## 2015-09-01 DIAGNOSIS — E876 Hypokalemia: Secondary | ICD-10-CM

## 2015-09-01 LAB — CBC WITH DIFFERENTIAL/PLATELET
BASO%: 0.5 % (ref 0.0–2.0)
BASOS ABS: 0 10*3/uL (ref 0.0–0.1)
EOS ABS: 0.1 10*3/uL (ref 0.0–0.5)
EOS%: 2.9 % (ref 0.0–7.0)
HEMATOCRIT: 36.6 % (ref 34.8–46.6)
HEMOGLOBIN: 12.3 g/dL (ref 11.6–15.9)
LYMPH%: 54.2 % — ABNORMAL HIGH (ref 14.0–49.7)
MCH: 30.4 pg (ref 25.1–34.0)
MCHC: 33.6 g/dL (ref 31.5–36.0)
MCV: 90.6 fL (ref 79.5–101.0)
MONO#: 0.3 10*3/uL (ref 0.1–0.9)
MONO%: 6.7 % (ref 0.0–14.0)
NEUT%: 35.7 % — ABNORMAL LOW (ref 38.4–76.8)
NEUTROS ABS: 1.5 10*3/uL (ref 1.5–6.5)
PLATELETS: 263 10*3/uL (ref 145–400)
RBC: 4.04 10*6/uL (ref 3.70–5.45)
RDW: 13.5 % (ref 11.2–14.5)
WBC: 4.2 10*3/uL (ref 3.9–10.3)
lymph#: 2.3 10*3/uL (ref 0.9–3.3)

## 2015-09-01 LAB — COMPREHENSIVE METABOLIC PANEL
ALBUMIN: 3.9 g/dL (ref 3.5–5.0)
ALK PHOS: 116 U/L (ref 40–150)
ALT: 23 U/L (ref 0–55)
ANION GAP: 9 meq/L (ref 3–11)
AST: 22 U/L (ref 5–34)
BILIRUBIN TOTAL: 0.31 mg/dL (ref 0.20–1.20)
BUN: 9.5 mg/dL (ref 7.0–26.0)
CALCIUM: 9.1 mg/dL (ref 8.4–10.4)
CO2: 25 mEq/L (ref 22–29)
Chloride: 108 mEq/L (ref 98–109)
Creatinine: 0.7 mg/dL (ref 0.6–1.1)
GLUCOSE: 107 mg/dL (ref 70–140)
POTASSIUM: 3.3 meq/L — AB (ref 3.5–5.1)
SODIUM: 142 meq/L (ref 136–145)
TOTAL PROTEIN: 7.4 g/dL (ref 6.4–8.3)

## 2015-09-01 NOTE — Telephone Encounter (Signed)
Appointments made and avs printed °

## 2015-09-01 NOTE — Progress Notes (Signed)
ID: Newell Coral Venable OB: 08/10/1965  MR#: [id number]  OIZ#:[id number]  PCP: No primary care provider on file. GYN:  Veneda Melter SU: [name]; [name] OTHER MD: [name]. [name]  CHIEF COMPLAINT:Left breast cancer, triple negative  CURRENT THERAPY:  observation  BREAST CANCER HISTORY:  from the original intake note:  Taite had routine screening mammography 03/09/2013 showing heterogeneously dense breasts, and a possible asymmetry in the left breast. Diagnostic left mammography and ultrasonography at the breast Center 03/30/2013 found a lobulated mass in the left breast upper outer quadrant measuring 1.6 cm. This was palpable. Ultrasound showed a complex microlobulated hypoechoic mass measuring 1.8 cm. There was no left axillary lymphadenopathy noted.  Biopsy of the mass in question 04/06/2013 showed ([id number]) and invasive ductal carcinoma, grade 3, triple negative, with an MIB-1 of 65%.  Bilateral breast MRI 04/13/2013 found an irregular mass measuring 2.5 cm in the left breast with a few lymph nodes that showed moderate cortical thickness, the largest measuring 12 mm (level I).  The patient's subsequent history is as detailed below  INTERVAL HISTORY: Korina returns today for follow up of her left-sided breast cancer.  The interval history is unremarkable for any extraordinary events. She has had no recent illnesses, hospitalizations, or changes in health history.She has some hot flashes and vaginal dryness. Her fatigue has improved slowly over time, and she hasn't even started working out yet. She is working full time and believes she is getting closer to her cognitive baseline with fewer "mental lapses." She is managing her depression well without antidepressants and believes her mood is stable.   REVIEW OF SYSTEMS: A detailed review of systems is otherwise entirely negative, except where noted above.   PAST MEDICAL HISTORY: Past Medical History  Diagnosis Date   . Fibroids   . Hypertension   . Hypothyroid   . Depression 09/23/2014  . Breast cancer 2015    left, triple negative     PAST SURGICAL HISTORY: Past Surgical History  Procedure Laterality Date  . Tubal ligation    . Portacath placement Right 04/27/2013    Procedure: ULTRASOUND GUIDED PORT INSERTION WITH FLUOROSCOPY;  Surgeon: [name], MD;  Location: [facility];  Service: General;  Laterality: Right;  . Breast biopsy Left 04/24/2013  . Total mastectomy Bilateral 10/30/2013    Procedure: TOTAL MASTECTOMY;  Surgeon: [name], MD;  Location: [place];  Service: General;  Laterality: Bilateral;  . Axillary lymph node biopsy Left 10/30/2013    Procedure: AXILLARY LYMPH NODE BIOPSY;  Surgeon: [name], MD;  Location: [place];  Service: General;  Laterality: Left;  . Port-a-cath removal Right 10/30/2013    Procedure: REMOVAL PORT-A-CATH;  Surgeon: [name], MD;  Location: [facility];  Service: General;  Laterality: Right;  . Breast reconstruction with placement of tissue expander and flex hd (acellular hydrated dermis) Bilateral 10/30/2013    Procedure: BILATERAL BREAST RECONSTRUCTION WITH PLACEMENT OF TISSUE EXPANDER AND FLEX HD (ACELLULAR HYDRATED DERMIS);  Surgeon: [name], MD;  Location: [facility];  Service: Plastics;  Laterality: Bilateral;    FAMILY HISTORY Family History  Problem Relation Age of Onset  . Hypertension Mother   . Diabetes Mother   . Alzheimer's disease Mother   . Hypertension Father   . Diabetes Father   . Hypertension Brother   . Epilepsy Son 4    being worked up for autism  . Thyroid disease Neg Hx    the patient's father died in  his 33A from complications of diabetes. The patient's mother is living, in her mid 11s. The patient had 2 brothers, and 2 sisters. There is no history of cancer in the family to her knowledge.  GYNECOLOGIC HISTORY:    (updated 10/03/2013)  Menarche age 58, first live birth age 30. The patient is GX P2. She is  postmenopausal, but did have some perimenopausal bleeding, which was evaluated age 54 08/14/2012 with a transabdominal and transvaginal ultrasound of the pelvis which found a normal uterine myometrium and left ovary. The right ovary could not be visualized.  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 Her husband [name] also works for the [employer] system. Their children are [name], [place], 5. Serrina's mother currently lives in the home as well. The patient attends a [employer].   ADVANCED DIRECTIVES: Not in place   HEALTH MAINTENANCE: (Updated 10/03/2013) Social History  Substance Use Topics  . Smoking status: Never Smoker   . Smokeless tobacco: Never Used  . Alcohol Use: 1.2 oz/week    2 Glasses of wine per week     Comment: occassion     Colonoscopy:- Never  PAP: UTD 2015  Bone density: Never  Lipid panel:     Allergies  Allergen Reactions  . Chocolate Itching  . Latex Itching  . Shrimp [Shellfish Allergy] Itching  . Wheat Bran Itching    Current Outpatient Prescriptions  Medication Sig Dispense Refill  . levothyroxine (SYNTHROID, LEVOTHROID) 75 MCG tablet Take 1 tablet (75 mcg total) by mouth daily before breakfast. 30 tablet 11   No current facility-administered medications for this visit.    OBJECTIVE:  Middle-aged African-American woman who appears stated age 50 Vitals:   09/01/15 0939  BP: 135/87  Pulse: 78  Temp: 98.2 F (36.8 C)  Resp: 18  Body mass index is 25.53 kg/(m^2).  ECOG: 1 Filed Weights   09/01/15 0939  Weight: 139 lb 9.6 oz (63.322 kg)   Skin: warm, dry  HEENT: sclerae anicteric, conjunctivae pink, oropharynx clear. No thrush or mucositis.  Lymph Nodes: No cervical or supraclavicular lymphadenopathy  Lungs: clear to auscultation bilaterally, no rales, wheezes, or rhonci  Heart: regular rate and rhythm  Abdomen: round, soft, non tender, positive bowel  sounds  Musculoskeletal: No focal spinal tenderness, no peripheral edema  Neuro: non focal, well oriented, positive affect  Breasts: bilateral breast status post mastectomies with implant reconstruction. No evidence of recurrent disease. Bilateral axillae benign.    LAB RESULTS:  Lab Results  Component Value Date   WBC 4.2 09/01/2015   NEUTROABS 1.5 09/01/2015   HGB 12.3 09/01/2015   HCT 36.6 09/01/2015   MCV 90.6 09/01/2015   PLT 263 09/01/2015      Chemistry      Component Value Date/Time   NA 142 09/01/2015 0915   NA CANCELED 01/01/2015 0912   K 3.3* 09/01/2015 0915   K CANCELED 01/01/2015 0912   CL CANCELED 01/01/2015 0912   CO2 25 09/01/2015 0915   CO2 CANCELED 01/01/2015 0912   BUN 9.5 09/01/2015 0915   BUN CANCELED 01/01/2015 0912   CREATININE 0.7 09/01/2015 0915   CREATININE CANCELED 01/01/2015 0912   CREATININE 0.51 10/22/2013 1019      Component Value Date/Time   CALCIUM 9.1 09/01/2015 0915   CALCIUM CANCELED 01/01/2015 0912   ALKPHOS 116 09/01/2015 0915   ALKPHOS CANCELED 01/01/2015 0912   AST 22 09/01/2015 0915  AST CANCELED 01/01/2015 0912   ALT 23 09/01/2015 0915   ALT CANCELED 01/01/2015 0912   BILITOT 0.31 09/01/2015 0915   BILITOT CANCELED 01/01/2015 0912       STUDIES: No results found.  ASSESSMENT: 50 y.o. BRCA negative [place] woman   (1)  status post left breast upper outer quadrant biopsy 04/06/2013 for a clinical T2 N1, stage IIB invasive ductal carcinoma, high-grade, triple negative, with an MIB-1 of 65%  (2) left axillary lymph node biopsy 04/24/2013 negative for malignancy  (3)  treated in the neoadjuvant setting:   (a) completed 4 dose dense cycles of doxorubicin/ cyclophosphamide 06/11/2013,.  (b)  completed 12 weekly cycles of carboplatin/ paclitaxel 09/24/2013  (4)  status post bilateral mastectomies 10/30/2013, showing benign results on the right and a complete pathologic response in the left, including both sentinel  lymph nodes clear  (5) status post bilateral breast implant reconstructive surgery  (6) pedunculated fibroid noted March 2016  (7) Right thyroid nodule noted July 2016  PLAN: Lamisha is doing well as far as her triple negative breast cancer is concerned. She is rapidly approaching her 2 year mark since completing chemotherapy and having surgery. There is no evidence of recurrent disease which is very favorable.  The labs were reviewed in detail. They were all normal, besides a low potassium level of 3.3. She was given a list of high potassium foods to incorporate into her diet, which is all the correction she likely needs.   [facility] help bring her energy level back up to baseline.  Mentally she is improving with better clarity and a more complete train of thought.   Deepika will return in October for follow up with Dr. [name] Hakim. She understands and agrees with this plan. She has been encouraged to call with any issues that might arise before her next visit here.  Laurie Panda, NP   09/01/2015 10:12 AM

## 2016-02-20 ENCOUNTER — Ambulatory Visit: Payer: BC Managed Care – PPO | Admitting: Endocrinology

## 2016-02-20 DIAGNOSIS — Z0289 Encounter for other administrative examinations: Secondary | ICD-10-CM

## 2016-02-23 ENCOUNTER — Other Ambulatory Visit: Payer: Self-pay | Admitting: Endocrinology

## 2016-02-23 NOTE — Telephone Encounter (Signed)
Please refill x 3 mos Ov is due 

## 2016-03-05 ENCOUNTER — Other Ambulatory Visit (INDEPENDENT_AMBULATORY_CARE_PROVIDER_SITE_OTHER): Payer: BC Managed Care – PPO

## 2016-03-05 DIAGNOSIS — E038 Other specified hypothyroidism: Secondary | ICD-10-CM

## 2016-03-05 LAB — TSH: TSH: 1.92 u[IU]/mL (ref 0.35–4.50)

## 2016-03-05 LAB — T4, FREE: Free T4: 0.95 ng/dL (ref 0.60–1.60)

## 2016-03-08 ENCOUNTER — Ambulatory Visit (INDEPENDENT_AMBULATORY_CARE_PROVIDER_SITE_OTHER): Payer: BC Managed Care – PPO | Admitting: Endocrinology

## 2016-03-08 ENCOUNTER — Encounter: Payer: Self-pay | Admitting: Endocrinology

## 2016-03-08 ENCOUNTER — Other Ambulatory Visit: Payer: BC Managed Care – PPO

## 2016-03-08 VITALS — BP 134/82 | HR 79 | Temp 98.9°F | Ht 62.0 in | Wt 143.4 lb

## 2016-03-08 DIAGNOSIS — E038 Other specified hypothyroidism: Secondary | ICD-10-CM | POA: Diagnosis not present

## 2016-03-08 NOTE — Patient Instructions (Addendum)
Please continue the same medication.  Please come back for a follow-up appointment in 1 year, when you will be due for a follow-up ultrasound.

## 2016-03-08 NOTE — Progress Notes (Signed)
Pre visit review using our clinic tool,if applicable. No additional management support is needed unless otherwise documented below in the visit note.  

## 2016-03-08 NOTE — Progress Notes (Signed)
Subjective:    Patient ID: Brittney Vance, female    DOB: [birth year], 50 y.o.   MRN: [id number]  HPI Pt returns for f/u of hyperthyroidism (dx'ed 2006, in the postpartum state; she has been on synthroid since then; [place] in 2016 showed single right-sided nodule, 9 mm x 7 mm x 9 mm; she finished chemotx in early 2015).  pt states she feels well in general.  She takes synthroid as rx'ed.   Past Medical History:  Diagnosis Date  . Breast cancer ([place]) 2015   left, triple negative   . Depression 09/23/2014  . Fibroids   . Hypertension   . Hypothyroid     Past Surgical History:  Procedure Laterality Date  . AXILLARY LYMPH NODE BIOPSY Left 10/30/2013   Procedure: AXILLARY LYMPH NODE BIOPSY;  Surgeon: [name], MD;  Location: [facility];  Service: General;  Laterality: Left;  . BREAST BIOPSY Left 04/24/2013  . BREAST RECONSTRUCTION WITH PLACEMENT OF TISSUE EXPANDER AND FLEX HD (ACELLULAR HYDRATED DERMIS) Bilateral 10/30/2013   Procedure: BILATERAL BREAST RECONSTRUCTION WITH PLACEMENT OF TISSUE EXPANDER AND FLEX HD (ACELLULAR HYDRATED DERMIS);  Surgeon: [name], MD;  Location: [facility];  Service: Plastics;  Laterality: Bilateral;  . PORT-A-CATH REMOVAL Right 10/30/2013   Procedure: REMOVAL PORT-A-CATH;  Surgeon: [name], MD;  Location: [facility];  Service: General;  Laterality: Right;  . PORTACATH PLACEMENT Right 04/27/2013   Procedure: ULTRASOUND GUIDED PORT INSERTION WITH FLUOROSCOPY;  Surgeon: [name], MD;  [facility];  Service: General;  Laterality: Right;  . TOTAL MASTECTOMY Bilateral 10/30/2013   Procedure: TOTAL MASTECTOMY;  Surgeon: [name], MD;  Location: [facility];  Service: General;  Laterality: Bilateral;  . TUBAL LIGATION      Social History   Social History  . Marital status: Married    Spouse name: [name]   . Number of children: 2  . Years of education: Masters    Occupational History  . media specialist  Unemployed    [employer]   Social History Main  Topics  . Smoking status: Never Smoker  . Smokeless tobacco: Never Used  . Alcohol use 1.2 oz/week    2 Glasses of wine per week     Comment: occassion  . Drug use: No  . Sexual activity: Yes    Partners: Male    Birth control/ protection: Post-menopausal     Comment: menarche age 23, first birth age 89, P2, post menopausal   Other Topics Concern  . Not on file   Social History Narrative   1-2 caffeine drinks per day. No regular exercise.    Current Outpatient Prescriptions on File Prior to Visit  Medication Sig Dispense Refill  . SYNTHROID 75 MCG tablet TAKE 1 TABLET (75 MCG TOTAL) BY MOUTH DAILY BEFORE BREAKFAST. 30 tablet 10   No current facility-administered medications on file prior to visit.     Allergies  Allergen Reactions  . Chocolate Itching  . Latex Itching  . Shrimp [Shellfish Allergy] Itching  . Wheat Bran Itching    Family History  Problem Relation Age of Onset  . Hypertension Mother   . Diabetes Mother   . Alzheimer's disease Mother   . Hypertension Father   . Diabetes Father   . Hypertension Brother   . Epilepsy Son 4    being worked up for autism  . Thyroid disease Neg Hx     BP 134/82   Pulse 79   Temp 98.9  F (37.2 C) (Oral)   Ht 5\' 2"  (1.575 m)   Wt 143 lb 6.4 oz (65 kg)   LMP 03/10/2013   SpO2 97%   BMI 26.23 kg/m   Review of Systems Denies neck pain.      Objective:   Physical Exam VITAL SIGNS:  See vs page.   GENERAL: no distress.   NECK: thyroid is several times normal size, with irregular surface (R>L).    Lab Results  Component Value Date   TSH 1.92 03/05/2016      Assessment & Plan:  Hypothyroidism: well-replaced.  Please continue the same medication

## 2016-03-11 ENCOUNTER — Encounter: Payer: Self-pay | Admitting: Oncology

## 2016-04-26 ENCOUNTER — Other Ambulatory Visit (HOSPITAL_BASED_OUTPATIENT_CLINIC_OR_DEPARTMENT_OTHER): Payer: BC Managed Care – PPO

## 2016-04-26 ENCOUNTER — Ambulatory Visit (HOSPITAL_BASED_OUTPATIENT_CLINIC_OR_DEPARTMENT_OTHER): Payer: BC Managed Care – PPO | Admitting: Oncology

## 2016-04-26 VITALS — BP 134/89 | HR 71 | Temp 98.0°F | Resp 18 | Ht 62.0 in | Wt 146.2 lb

## 2016-04-26 DIAGNOSIS — C50412 Malignant neoplasm of upper-outer quadrant of left female breast: Secondary | ICD-10-CM

## 2016-04-26 DIAGNOSIS — Z853 Personal history of malignant neoplasm of breast: Secondary | ICD-10-CM

## 2016-04-26 DIAGNOSIS — Z171 Estrogen receptor negative status [ER-]: Principal | ICD-10-CM

## 2016-04-26 DIAGNOSIS — C50921 Malignant neoplasm of unspecified site of right male breast: Secondary | ICD-10-CM

## 2016-04-26 LAB — COMPREHENSIVE METABOLIC PANEL
ALT: 18 U/L (ref 0–55)
ANION GAP: 10 meq/L (ref 3–11)
AST: 22 U/L (ref 5–34)
Albumin: 3.9 g/dL (ref 3.5–5.0)
Alkaline Phosphatase: 112 U/L (ref 40–150)
BUN: 10.5 mg/dL (ref 7.0–26.0)
CHLORIDE: 105 meq/L (ref 98–109)
CO2: 28 meq/L (ref 22–29)
CREATININE: 0.7 mg/dL (ref 0.6–1.1)
Calcium: 9.7 mg/dL (ref 8.4–10.4)
EGFR: >90 mL/min/{1.73_m2} (ref >90)
Glucose: 90 mg/dl (ref 70–140)
POTASSIUM: 3.4 meq/L — AB (ref 3.5–5.1)
Sodium: 143 mEq/L (ref 136–145)
Total Bilirubin: 0.32 mg/dL (ref 0.20–1.20)
Total Protein: 7.9 g/dL (ref 6.4–8.3)

## 2016-04-26 LAB — CBC WITH DIFFERENTIAL/PLATELET
BASO%: 0.5 % (ref 0.0–2.0)
Basophils Absolute: 0 10*3/uL (ref 0.0–0.1)
EOS%: 1.4 % (ref 0.0–7.0)
Eosinophils Absolute: 0.1 10*3/uL (ref 0.0–0.5)
HCT: 38.2 % (ref 34.8–46.6)
HGB: 13 g/dL (ref 11.6–15.9)
LYMPH%: 49.6 % (ref 14.0–49.7)
MCH: 30 pg (ref 25.1–34.0)
MCHC: 34 g/dL (ref 31.5–36.0)
MCV: 88.2 fL (ref 79.5–101.0)
MONO#: 0.3 10*3/uL (ref 0.1–0.9)
MONO%: 7 % (ref 0.0–14.0)
NEUT#: 1.7 10*3/uL (ref 1.5–6.5)
NEUT%: 41.5 % (ref 38.4–76.8)
Platelets: 244 10*3/uL (ref 145–400)
RBC: 4.33 10*6/uL (ref 3.70–5.45)
RDW: 12.8 % (ref 11.2–14.5)
WBC: 4.2 10*3/uL (ref 3.9–10.3)
lymph#: 2.1 10*3/uL (ref 0.9–3.3)

## 2016-04-26 NOTE — Progress Notes (Signed)
ID: Newell Coral Crall OB: [birth year]  MR#: [id number]  UKG#:[id number]  PCP: No primary care provider on file. GYN:  Veneda Melter SU: [name]; [name] OTHER MD: [name]. [name]  CHIEF COMPLAINT:Left breast cancer, triple negative  CURRENT THERAPY:  observation  BREAST CANCER HISTORY:  from the original intake note:  Jayde had routine screening mammography 03/09/2013 showing heterogeneously dense breasts, and a possible asymmetry in the left breast. Diagnostic left mammography and ultrasonography at the breast Center 03/30/2013 found a lobulated mass in the left breast upper outer quadrant measuring 1.6 cm. This was palpable. Ultrasound showed a complex microlobulated hypoechoic mass measuring 1.8 cm. There was no left axillary lymphadenopathy noted.  Biopsy of the mass in question 04/06/2013 showed ([id number]) and invasive ductal carcinoma, grade 3, triple negative, with an MIB-1 of 65%.  Bilateral breast MRI 04/13/2013 found an irregular mass measuring 2.5 cm in the left breast with a few lymph nodes that showed moderate cortical thickness, the largest measuring 12 mm (level I).  The patient's subsequent history is as detailed below  INTERVAL HISTORY: Avelynn returns today for follow up of her triple negative breast cancer. She is doing "fine". She enjoys her job, which is changing his [place] change. Family is doing well. For exercise she walks her dog.   REVIEW OF SYSTEMS: A detailed review of systems today was otherwise benign.  PAST MEDICAL HISTORY: Past Medical History:  Diagnosis Date  . Breast cancer ([place]) 2015   left, triple negative   . Depression 09/23/2014  . Fibroids   . Hypertension   . Hypothyroid     PAST SURGICAL HISTORY: Past Surgical History:  Procedure Laterality Date  . AXILLARY LYMPH NODE BIOPSY Left 10/30/2013   Procedure: AXILLARY LYMPH NODE BIOPSY;  Surgeon: [name], MD;  Location: [facility];  Service: General;  Laterality:  Left;  . BREAST BIOPSY Left 04/24/2013  . BREAST RECONSTRUCTION WITH PLACEMENT OF TISSUE EXPANDER AND FLEX HD (ACELLULAR HYDRATED DERMIS) Bilateral 10/30/2013   Procedure: BILATERAL BREAST RECONSTRUCTION WITH PLACEMENT OF TISSUE EXPANDER AND FLEX HD (ACELLULAR HYDRATED DERMIS);  Surgeon: [name], MD;  Location: [facility];  Service: Plastics;  Laterality: Bilateral;  . PORT-A-CATH REMOVAL Right 10/30/2013   Procedure: REMOVAL PORT-A-CATH;  Surgeon: [name], MD;  Location: [facility];  Service: General;  Laterality: Right;  . PORTACATH PLACEMENT Right 04/27/2013   Procedure: ULTRASOUND GUIDED PORT INSERTION WITH FLUOROSCOPY;  Surgeon: [name], MD;  [facility];  Service: General;  Laterality: Right;  . TOTAL MASTECTOMY Bilateral 10/30/2013   Procedure: TOTAL MASTECTOMY;  Surgeon: [name], MD;  Location: [facility];  Service: General;  Laterality: Bilateral;  . TUBAL LIGATION      FAMILY HISTORY Family History  Problem Relation Age of Onset  . Hypertension Mother   . Diabetes Mother   . Alzheimer's disease Mother   . Hypertension Father   . Diabetes Father   . Hypertension Brother   . Epilepsy Son 4    being worked up for autism  . Thyroid disease Neg Hx   2 the patient's father died in his 76E from complications of diabetes. The patient's mother is living, in her mid 56s. The patient had 2 brothers, and 2 sisters. There is no history of cancer in the family to her knowledge.  GYNECOLOGIC HISTORY:    (updated 10/03/2013)  Menarche age 27, first live birth age 76. The patient is GX P2. She is postmenopausal, but did  have some perimenopausal bleeding, which was evaluated age 76 08/14/2012 with a transabdominal and transvaginal ultrasound of the pelvis which found a normal uterine myometrium and left ovary. The right ovary could not be visualized.  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 Her husband [name]  also works for the [employer] system. Their children are [name], [place], 5. Afrah's mother currently lives in the home as well. The patient attends a [employer].   ADVANCED DIRECTIVES: Not in place   HEALTH MAINTENANCE: (Updated 10/03/2013) Social History  Substance Use Topics  . Smoking status: Never Smoker  . Smokeless tobacco: Never Used  . Alcohol use 1.2 oz/week    2 Glasses of wine per week     Comment: occassion     Colonoscopy:- June 2016  PAP: UTD 2015  Bone density: Never  Lipid panel:     Allergies  Allergen Reactions  . Chocolate Itching  . Latex Itching  . Shrimp [Shellfish Allergy] Itching  . Wheat Bran Itching    Current Outpatient Prescriptions  Medication Sig Dispense Refill  . SYNTHROID 75 MCG tablet TAKE 1 TABLET (75 MCG TOTAL) BY MOUTH DAILY BEFORE BREAKFAST. 30 tablet 10   No current facility-administered medications for this visit.     OBJECTIVE:  Middle-aged African-American woman In no acute distress Vitals:   04/26/16 0953  BP: 134/89  Pulse: 71  Resp: 18  Temp: 98 F (36.7 C)  Body mass index is 26.74 kg/m.  ECOG: 1 Filed Weights   04/26/16 0953  Weight: 146 lb 3.2 oz (66.3 kg)   Sclerae unicteric, pupils round and equal Oropharynx clear and moist-- no thrush or other lesions No cervical or supraclavicular adenopathy Lungs no rales or rhonchi Heart regular rate and rhythm Abd soft, nontender, positive bowel sounds MSK no focal spinal tenderness, no upper extremity lymphedema Neuro: nonfocal, well oriented, appropriate affect Breasts: Status post bilateral mastectomies with bilateral reconstruction. There is no evidence of local recurrence. Both axillae are benign.    LAB RESULTS:  Lab Results  Component Value Date   WBC 4.2 04/26/2016   NEUTROABS 1.7 04/26/2016   HGB 13.0 04/26/2016   HCT 38.2 04/26/2016   MCV 88.2 04/26/2016   PLT 244 04/26/2016      Chemistry      Component Value Date/Time    NA 143 04/26/2016 0911   K 3.4 (L) 04/26/2016 0911   CL CANCELED 01/01/2015 0912   CO2 28 04/26/2016 0911   BUN 10.5 04/26/2016 0911   CREATININE 0.7 04/26/2016 0911      Component Value Date/Time   CALCIUM 9.7 04/26/2016 0911   ALKPHOS 112 04/26/2016 0911   AST 22 04/26/2016 0911   ALT 18 04/26/2016 0911   BILITOT 0.32 04/26/2016 0911       STUDIES: No results found.  ASSESSMENT: 50 y.o. BRCA negative [place] woman   (1)  status post left breast upper outer quadrant biopsy 04/06/2013 for a clinical T2 N1, stage IIB invasive ductal carcinoma, high-grade, triple negative, with an MIB-1 of 65%  (2) left axillary lymph node biopsy 04/24/2013 negative for malignancy  (3)  treated in the neoadjuvant setting:   (a) completed 4 dose dense cycles of doxorubicin/ cyclophosphamide 06/11/2013,.  (b)  completed 12 weekly cycles of carboplatin/ paclitaxel 09/24/2013  (4)  status post bilateral mastectomies 10/30/2013, showing benign results on the right and a complete pathologic response in the left,  including both sentinel lymph nodes clear  (5) status post bilateral breast implant reconstructive surgery  (6) pedunculated fibroid noted March 2016  (7) Right thyroid nodule noted July 2016  PLAN: Kaydi is now 2 years out from definitive surgery for her breast cancer with no evidence of disease recurrence. This is very favorable.  It is particularly favorable because of course she had a complete pathologic response at the time of her surgery.  I gave her information on the [employer] program and also on the chance to recovery program.  She is having a great deal of difficulty carrying heavy boxes after her bilateral mastectomies. I think no heavy lifting probably is indicated in her case. I wrote her node with that in mind.  Otherwise she will return to see me in one year. At the 5 year mark she will be ready to "graduate" from cancer follow up.   Chauncey Cruel, MD    04/26/2016 10:15 AM

## 2016-06-14 ENCOUNTER — Ambulatory Visit: Payer: BC Managed Care – PPO | Admitting: Family Medicine

## 2016-07-01 ENCOUNTER — Ambulatory Visit (INDEPENDENT_AMBULATORY_CARE_PROVIDER_SITE_OTHER): Payer: BC Managed Care – PPO | Admitting: Family Medicine

## 2016-07-01 ENCOUNTER — Encounter: Payer: Self-pay | Admitting: Family Medicine

## 2016-07-01 VITALS — BP 136/86 | HR 91 | Temp 98.8°F | Resp 16 | Ht 62.0 in | Wt 143.6 lb

## 2016-07-01 DIAGNOSIS — E034 Atrophy of thyroid (acquired): Secondary | ICD-10-CM | POA: Diagnosis not present

## 2016-07-01 DIAGNOSIS — Z853 Personal history of malignant neoplasm of breast: Secondary | ICD-10-CM
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
## 2016-07-01 NOTE — Patient Instructions (Addendum)
You can come and have a physical in February- we can do your thyroid level and cholesterol at that time

## 2016-08-13 ENCOUNTER — Other Ambulatory Visit: Payer: Self-pay | Admitting: Nurse Practitioner

## 2016-09-02 ENCOUNTER — Encounter: Payer: BC Managed Care – PPO | Admitting: Family Medicine
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
## 2016-09-03 ENCOUNTER — Encounter: Payer: Self-pay | Admitting: Family Medicine

## 2016-10-29 ENCOUNTER — Telehealth: Payer: Self-pay

## 2016-10-29 NOTE — Telephone Encounter (Signed)
Patient wants to know if she can have labs done before her appt on 4/19. Patients wants to know if she can have labs done at [facility].

## 2016-10-29 NOTE — Telephone Encounter (Signed)
Pt has a lot of stress with job and mother been ill. Pt wants to make sure with tiredness she is having that something else is not going on with her. Instructed her to get labs through PCP.  When she went back to work she had a sun restriction. The last letter stating this that she received was a year or 2 ago. Does she still need sun restrictions and if so can she get an updated letter.   04/25/17 is her next OV

## 2016-10-30 ENCOUNTER — Encounter: Payer: Self-pay | Admitting: Family Medicine

## 2016-10-30 ENCOUNTER — Other Ambulatory Visit: Payer: Self-pay | Admitting: Family Medicine

## 2016-10-30 DIAGNOSIS — E034 Atrophy of thyroid (acquired): Secondary | ICD-10-CM

## 2016-10-30 DIAGNOSIS — Z130 Encounter for screening for diseases of the blood and blood-forming organs and certain disorders involving the immune mechanism: Secondary | ICD-10-CM

## 2016-10-30 DIAGNOSIS — Z13220 Encounter for screening for lipoid disorders: Secondary | ICD-10-CM

## 2016-10-30 DIAGNOSIS — Z131 Encounter for screening for diabetes mellitus: Secondary | ICD-10-CM

## 2016-11-02 NOTE — Telephone Encounter (Signed)
She can discuss this with pcp when she sees them.

## 2016-11-08 NOTE — Telephone Encounter (Signed)
Provider responded to pt through [place] on 10/30/16. Called pt to see if she received the message. Pt states that she had not checked MyChart. Informed pt of provider response. Pt states that she will try to come in on 11/09/16 for labs.

## 2016-11-09 ENCOUNTER — Other Ambulatory Visit (INDEPENDENT_AMBULATORY_CARE_PROVIDER_SITE_OTHER): Payer: BC Managed Care – PPO

## 2016-11-09 DIAGNOSIS — Z130 Encounter for screening for diseases of the blood and blood-forming organs and certain disorders involving the immune mechanism: Secondary | ICD-10-CM | POA: Diagnosis not present

## 2016-11-09 DIAGNOSIS — Z131 Encounter for screening for diabetes mellitus: Secondary | ICD-10-CM

## 2016-11-09 DIAGNOSIS — Z13220 Encounter for screening for lipoid disorders: Secondary | ICD-10-CM | POA: Diagnosis not present

## 2016-11-09 DIAGNOSIS — E034 Atrophy of thyroid (acquired): Secondary | ICD-10-CM

## 2016-11-09 LAB — CBC
HEMATOCRIT: 40.2 % (ref 36.0–46.0)
Hemoglobin: 13.6 g/dL (ref 12.0–15.0)
MCHC: 33.9 g/dL (ref 30.0–36.0)
MCV: 89.8 fl (ref 78.0–100.0)
Platelets: 271 10*3/uL (ref 150.0–400.0)
RBC: 4.48 Mil/uL (ref 3.87–5.11)
RDW: 13.6 % (ref 11.5–15.5)
WBC: 4.4 10*3/uL (ref 4.0–10.5)

## 2016-11-09 LAB — COMPREHENSIVE METABOLIC PANEL
ALT: 15 U/L (ref 0–35)
AST: 17 U/L (ref 0–37)
Albumin: 4.3 g/dL (ref 3.5–5.2)
Alkaline Phosphatase: 83 U/L (ref 39–117)
BUN: 11 mg/dL (ref 6–23)
CO2: 32 mEq/L (ref 19–32)
Calcium: 9.9 mg/dL (ref 8.4–10.5)
Chloride: 104 mEq/L (ref 96–112)
Creatinine, Ser: 0.69 mg/dL (ref 0.40–1.20)
GFR: 114.88 mL/min (ref >60.00)
Glucose, Bld: 97 mg/dL (ref 70–99)
Potassium: 3.9 mEq/L (ref 3.5–5.1)
Sodium: 141 mEq/L (ref 135–145)
Total Bilirubin: 0.4 mg/dL (ref 0.2–1.2)
Total Protein: 7.8 g/dL (ref 6.0–8.3)

## 2016-11-09 LAB — LIPID PANEL
Cholesterol: 234 mg/dL — ABNORMAL HIGH (ref 0–200)
HDL: 66.2 mg/dL (ref >39.00)
LDL Cholesterol: 157 mg/dL — ABNORMAL HIGH (ref 0–99)
NonHDL: 167.92
Total CHOL/HDL Ratio: 4
Triglycerides: 53 mg/dL (ref 0.0–149.0)
VLDL: 10.6 mg/dL (ref 0.0–40.0)

## 2016-11-09 LAB — TSH: TSH: 1.14 u[IU]/mL (ref 0.35–4.50)

## 2016-11-09 LAB — HEMOGLOBIN A1C: Hgb A1c MFr Bld: 5.7 % (ref 4.6–6.5)

## 2016-11-11 ENCOUNTER — Ambulatory Visit (INDEPENDENT_AMBULATORY_CARE_PROVIDER_SITE_OTHER): Payer: BC Managed Care – PPO | Admitting: Family Medicine

## 2016-11-11 ENCOUNTER — Encounter: Payer: Self-pay | Admitting: Family Medicine

## 2016-11-11 VITALS — BP 138/88 | HR 90 | Temp 98.5°F | Ht 62.0 in | Wt 141.8 lb

## 2016-11-11 DIAGNOSIS — E034 Atrophy of thyroid (acquired): Secondary | ICD-10-CM

## 2016-11-11 DIAGNOSIS — Z0000 Encounter for general adult medical examination without abnormal findings: Secondary | ICD-10-CM

## 2016-11-11 DIAGNOSIS — E785 Hyperlipidemia, unspecified: Secondary | ICD-10-CM | POA: Diagnosis not present

## 2016-11-11 DIAGNOSIS — Z853 Personal history of malignant neoplasm of breast: Secondary | ICD-10-CM

## 2016-11-11 NOTE — Patient Instructions (Signed)
It was very nice to see you today!  Take care.  Here is a copy of your labs  Results for orders placed or performed in visit on 11/09/16  CBC  Result Value Ref Range   WBC 4.4 4.0 - 10.5 K/uL   RBC 4.48 3.87 - 5.11 Mil/uL   Platelets 271.0 150.0 - 400.0 K/uL   Hemoglobin 13.6 12.0 - 15.0 g/dL   HCT 40.2 36.0 - 46.0 %   MCV 89.8 78.0 - 100.0 fl   MCHC 33.9 30.0 - 36.0 g/dL   RDW 13.6 11.5 - 15.5 %  Comprehensive metabolic panel  Result Value Ref Range   Sodium 141 135 - 145 mEq/L   Potassium 3.9 3.5 - 5.1 mEq/L   Chloride 104 96 - 112 mEq/L   CO2 32 19 - 32 mEq/L   Glucose, Bld 97 70 - 99 mg/dL   BUN 11 6 - 23 mg/dL   Creatinine, Ser 0.69 0.40 - 1.20 mg/dL   Total Bilirubin 0.4 0.2 - 1.2 mg/dL   Alkaline Phosphatase 83 39 - 117 U/L   AST 17 0 - 37 U/L   ALT 15 0 - 35 U/L   Total Protein 7.8 6.0 - 8.3 g/dL   Albumin 4.3 3.5 - 5.2 g/dL   Calcium 9.9 8.4 - 10.5 mg/dL   GFR 114.88 >60.00 mL/min  TSH  Result Value Ref Range   TSH 1.14 0.35 - 4.50 uIU/mL  Lipid panel  Result Value Ref Range   Cholesterol 234 (H) 0 - 200 mg/dL   Triglycerides 53.0 0.0 - 149.0 mg/dL   HDL 66.20 >39.00 mg/dL   VLDL 10.6 0.0 - 40.0 mg/dL   LDL Cholesterol 157 (H) 0 - 99 mg/dL   Total CHOL/HDL Ratio 4    NonHDL 167.92   Hemoglobin A1c  Result Value Ref Range   Hgb A1c MFr Bld 5.7 4.6 - 6.5 %   As we discussed, your thyroid, metabolic profile and CBC look great.  Your cholesterol is a bit high and your A1c is on the border of pre-diabetes.  Work on losing a few pounds, and on making good food choices.  We can plan to meet again in 6-12 months. Take care!
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
## 2017-01-03 ENCOUNTER — Encounter: Payer: Self-pay | Admitting: Oncology

## 2017-01-16 ENCOUNTER — Other Ambulatory Visit: Payer: Self-pay | Admitting: Endocrinology

## 2017-02-08 ENCOUNTER — Telehealth: Payer: Self-pay

## 2017-02-08 NOTE — Telephone Encounter (Signed)
Spoke with patient and she is aware of her new appts due to dr gm out of office  Brandon Scarbrough 

## 2017-02-09 ENCOUNTER — Ambulatory Visit (INDEPENDENT_AMBULATORY_CARE_PROVIDER_SITE_OTHER): Payer: BC Managed Care – PPO | Admitting: Endocrinology

## 2017-02-09 ENCOUNTER — Encounter: Payer: Self-pay | Admitting: Endocrinology

## 2017-02-09 VITALS — BP 124/80 | HR 85 | Ht 62.0 in | Wt 143.0 lb

## 2017-02-09 DIAGNOSIS — E038 Other specified hypothyroidism: Secondary | ICD-10-CM | POA: Diagnosis not present

## 2017-02-09 DIAGNOSIS — E049 Nontoxic goiter, unspecified: Secondary | ICD-10-CM

## 2017-02-09 NOTE — Progress Notes (Signed)
Subjective:    Patient ID: Brittney Vance, female    DOB: Aug 23, 1965, 51 y.o.   MRN: [id number]  HPI Pt returns for f/u of hypoyhyroidism (dx'ed 2006, in the postpartum state; she has been on synthroid since then; [place] in 2016 showed single right-sided nodule, 9 mm x 7 mm x 9 mm; she finished chemotx in early 2015).  pt states she feels well in general.  She takes synthroid as rx'ed.  She does not notice the goiter Past Medical History:  Diagnosis Date  . Breast cancer ([place]) 2015   left, triple negative   . Depression 09/23/2014  . Fibroids   . Hypertension   . Hypothyroid     Past Surgical History:  Procedure Laterality Date  . AXILLARY LYMPH NODE BIOPSY Left 10/30/2013   Procedure: AXILLARY LYMPH NODE BIOPSY;  Surgeon: [name], MD;  Location: [facility];  Service: General;  Laterality: Left;  . BREAST BIOPSY Left 04/24/2013  . BREAST RECONSTRUCTION WITH PLACEMENT OF TISSUE EXPANDER AND FLEX HD (ACELLULAR HYDRATED DERMIS) Bilateral 10/30/2013   Procedure: BILATERAL BREAST RECONSTRUCTION WITH PLACEMENT OF TISSUE EXPANDER AND FLEX HD (ACELLULAR HYDRATED DERMIS);  Surgeon: [name], MD;  Location: [facility];  Service: Plastics;  Laterality: Bilateral;  . PORT-A-CATH REMOVAL Right 10/30/2013   Procedure: REMOVAL PORT-A-CATH;  Surgeon: [name], MD;  Location: [facility];  Service: General;  Laterality: Right;  . PORTACATH PLACEMENT Right 04/27/2013   Procedure: ULTRASOUND GUIDED PORT INSERTION WITH FLUOROSCOPY;  Surgeon: [name], MD;  Location: [facility];  Service: General;  Laterality: Right;  . TOTAL MASTECTOMY Bilateral 10/30/2013   Procedure: TOTAL MASTECTOMY;  Surgeon: [name], MD;  Location: [facility];  Service: General;  Laterality: Bilateral;  . TUBAL LIGATION      Social History   Social History  . Marital status: Married    Spouse name: [name]   . Number of children: 2  . Years of education: Masters    Occupational History  . media specialist  Unemployed   [employer]   Social History Main Topics  . Smoking status: Never Smoker  . Smokeless tobacco: Never Used  . Alcohol use 1.2 oz/week    2 Glasses of wine per week     Comment: occassion  . Drug use: No  . Sexual activity: Yes    Partners: Male    Birth control/ protection: Post-menopausal     Comment: menarche age 89, first birth age 26, P2, post menopausal   Other Topics Concern  . Not on file   Social History Narrative   1-2 caffeine drinks per day. No regular exercise.    Current Outpatient Prescriptions on File Prior to Visit  Medication Sig Dispense Refill  . SYNTHROID 75 MCG tablet TAKE ONE TABLET BY MOUTH DAILY BEFORE BREAKFAST 30 tablet 9   No current facility-administered medications on file prior to visit.     Allergies  Allergen Reactions  . Chocolate Itching  . Latex Itching  . Shrimp [Shellfish Allergy] Itching  . Wheat Bran Itching    Family History  Problem Relation Age of Onset  . Hypertension Mother   . Diabetes Mother   . Alzheimer's disease Mother   . Hypertension Father   . Diabetes Father   . Hypertension Brother   . Epilepsy Son 4       being worked up for autism  . Thyroid disease Neg Hx     BP 124/80   Pulse 85  Ht 5\' 2"  (1.575 m)   Wt 143 lb (64.9 kg)   LMP 03/10/2013   SpO2 97%   BMI 26.16 kg/m   Review of Systems Denies neck pain.     Objective:   Physical Exam VITAL SIGNS:  See vs page.   GENERAL: no distress.   NECK: thyroid is several times normal size, with irregular surface (R>L).  The right nodule is 1-2 cm.   Lab Results  Component Value Date   TSH 1.14 11/09/2016      Assessment & Plan:  Multinodular goiter, with possible enlargement of the right nodule.   Hypothyroidism: well-replaced.    Patient Instructions  Please continue the same medication.  Let's recheck the ultrasound.  you will receive a phone call, about a day and time for an appointment.  If the right sided nodule is significantly bigger, please  come back here for a biopsy.  It is easy.  Please come back for a follow-up appointment in 1 year.

## 2017-02-09 NOTE — Patient Instructions (Addendum)
Please continue the same medication.  Let's recheck the ultrasound.  you will receive a phone call, about a day and time for an appointment.  If the right sided nodule is significantly bigger, please come back here for a biopsy.  It is easy.  Please come back for a follow-up appointment in 1 year.

## 2017-02-12 ENCOUNTER — Encounter: Payer: Self-pay | Admitting: Family Medicine

## 2017-02-15 ENCOUNTER — Ambulatory Visit
Admission: RE | Admit: 2017-02-15 | Discharge: 2017-02-15 | Disposition: A | Discharge status: Home / Self Care | Payer: BC Managed Care – PPO | Source: Ambulatory Visit | Attending: Endocrinology | Admitting: Endocrinology

## 2017-02-15 DIAGNOSIS — E049 Nontoxic goiter, unspecified: Secondary | ICD-10-CM

## 2017-02-16 ENCOUNTER — Other Ambulatory Visit: Payer: BC Managed Care – PPO

## 2017-02-23 ENCOUNTER — Other Ambulatory Visit: Payer: Self-pay | Admitting: Emergency Medicine

## 2017-02-23 DIAGNOSIS — G4733 Obstructive sleep apnea (adult) (pediatric): Secondary | ICD-10-CM

## 2017-02-24 ENCOUNTER — Ambulatory Visit (INDEPENDENT_AMBULATORY_CARE_PROVIDER_SITE_OTHER): Payer: BC Managed Care – PPO | Admitting: Obstetrics & Gynecology

## 2017-02-24 ENCOUNTER — Encounter: Payer: Self-pay | Admitting: Obstetrics & Gynecology

## 2017-02-24 VITALS — BP 130/84 | HR 93 | Resp 16 | Ht 63.0 in | Wt 142.0 lb

## 2017-02-24 DIAGNOSIS — C50412 Malignant neoplasm of upper-outer quadrant of left female breast: Secondary | ICD-10-CM

## 2017-02-24 DIAGNOSIS — Z171 Estrogen receptor negative status [ER-]: Secondary | ICD-10-CM

## 2017-02-24 DIAGNOSIS — Z1151 Encounter for screening for human papillomavirus (HPV): Secondary | ICD-10-CM

## 2017-02-24 DIAGNOSIS — Z124 Encounter for screening for malignant neoplasm of cervix: Secondary | ICD-10-CM | POA: Diagnosis not present

## 2017-02-24 DIAGNOSIS — Z01419 Encounter for gynecological examination (general) (routine) without abnormal findings: Secondary | ICD-10-CM

## 2017-02-24 DIAGNOSIS — G4730 Sleep apnea, unspecified: Secondary | ICD-10-CM

## 2017-02-27 ENCOUNTER — Encounter: Payer: Self-pay | Admitting: Obstetrics & Gynecology

## 2017-02-27 NOTE — Progress Notes (Signed)
Subjective:    Brittney Vance is a 51 y.o. female who presents for an annual exam. The patient has no complaints today. The patient is sexually active. GYN screening history: last pap: was normal. The patient wears seatbelts: yes. The patient participates in regular exercise: no. Has the patient ever been transfused or tattooed?: not asked. The patient reports that there is not domestic violence in her life.  Pt s/p double mastectom with implants.  Was told not to get mammograms any more.    Menstrual History: OB History    Gravida Para Term Preterm AB Living   2 2           SAB TAB Ectopic Multiple Live Births                  Patient's last menstrual period was 03/10/2013.    The following portions of the patient's history were reviewed and updated as appropriate: allergies, current medications, past family history, past medical history, past social history, past surgical history and problem list.  Review of Systems Pertinent items noted in HPI and remainder of comprehensive ROS otherwise negative.    Objective:      Vitals:   02/24/17 1615  BP: 130/84  Pulse: 93  Resp: 16  Weight: 142 lb (64.4 kg)  Height: 5\' 3"  (1.6 m)   Vitals:  WNL General appearance: alert, cooperative and no distress  HEENT: Normocephalic, without obvious abnormality, atraumatic Eyes: negative Throat: lips, mucosa, and tongue normal; teeth and gums normal  Respiratory: Clear to auscultation bilaterally  CV: Regular rate and rhythm  Breasts:  No breast tissue felt.  Implants in place.  No nodules nor enlarged lymph nodes felt suprclavicularly  GI: Soft, non-tender; bowel sounds normal; no masses,  no organomegaly  GU: External Genitalia:  Tanner V, no lesion Urethra:  No prolapse   Vagina: Pink, normal rugae, no blood or discharge  Cervix: No CMT, no lesion  Uterus:  Normal size and contour, non tender  Adnexa: Normal, no masses, non tender  Musculoskeletal: No edema, redness or tenderness in the  calves or thighs  Skin: No lesions or rash  Lymphatic: Axillary adenopathy: none     Psychiatric: Normal mood and behavior    .    Assessment:    Healthy female exam.   S/p bilateral mastectoy Menopause   Plan:   pap with co testing F/u with oncologist prn Colonoscopy q 10 uears Calcium.vit D intake sheet given

## 2017-02-28 LAB — CYTOLOGY - PAP
DIAGNOSIS: NEGATIVE
HPV: NOT DETECTED

## 2017-03-08 ENCOUNTER — Ambulatory Visit: Payer: BC Managed Care – PPO | Admitting: Endocrinology

## 2017-04-25 ENCOUNTER — Ambulatory Visit: Payer: BC Managed Care – PPO | Admitting: Oncology

## 2017-04-25 ENCOUNTER — Other Ambulatory Visit: Payer: BC Managed Care – PPO

## 2017-05-12 ENCOUNTER — Encounter: Payer: Self-pay | Admitting: Oncology

## 2017-05-17 NOTE — Progress Notes (Signed)
[facility]  Telephone:([phone] Fax:(336) [phone]    ID: Brittney Vance OB: Oct 10, 1965  MR#: [id number]  FUX#:[id number]  PCP: [name], MD GYN:  Veneda Melter SU: [name]; [name] OTHER MD: [name]. [name]  CHIEF COMPLAINT:Left breast cancer, triple negative  CURRENT THERAPY:  observation  BREAST CANCER HISTORY:  from the original intake note:  Brittney Vance had routine screening mammography 03/09/2013 showing heterogeneously dense breasts, and a possible asymmetry in the left breast. Diagnostic left mammography and ultrasonography at the breast Center 03/30/2013 found a lobulated mass in the left breast upper outer quadrant measuring 1.6 cm. This was palpable. Ultrasound showed a complex microlobulated hypoechoic mass measuring 1.8 cm. There was no left axillary lymphadenopathy noted.  Biopsy of the mass in question 04/06/2013 showed ([id number]) and invasive ductal carcinoma, grade 3, triple negative, with an MIB-1 of 65%.  Bilateral breast MRI 04/13/2013 found an irregular mass measuring 2.5 cm in the left breast with a few lymph nodes that showed moderate cortical thickness, the largest measuring 12 mm (level I).  The patient's subsequent history is as detailed below  INTERVAL HISTORY: Brittney Vance returns today for follow-up of her estrogen receptor negative breast cancer. She continues under observation.  REVIEW OF SYSTEMS: Brittney Vance is doing well staying busy. She is working, taking care of her children, mother and her husband. She notes that work has become too much and it has emotionally drained her. She does not have time to exercise due to her busy schedule. She denies unusual headaches, visual changes, nausea, vomiting, or dizziness. There has been no unusual cough, phlegm production, or pleurisy. This been no change in bowel or bladder habits. She denies unexplained fatigue or unexplained weight loss, bleeding, rash, or fever. A detailed  review of systems was otherwise entirely stable.   PAST MEDICAL HISTORY: Past Medical History:  Diagnosis Date  . Breast cancer ([place]) 2015   left, triple negative   . Depression 09/23/2014  . Fibroids   . Hypertension   . Hypothyroid     PAST SURGICAL HISTORY: Past Surgical History:  Procedure Laterality Date  . AXILLARY LYMPH NODE BIOPSY Left 10/30/2013   Procedure: AXILLARY LYMPH NODE BIOPSY;  Surgeon: [name], MD;  Location: [facility];  Service: General;  Laterality: Left;  . BREAST BIOPSY Left 04/24/2013  . BREAST RECONSTRUCTION WITH PLACEMENT OF TISSUE EXPANDER AND FLEX HD (ACELLULAR HYDRATED DERMIS) Bilateral 10/30/2013   Procedure: BILATERAL BREAST RECONSTRUCTION WITH PLACEMENT OF TISSUE EXPANDER AND FLEX HD (ACELLULAR HYDRATED DERMIS);  Surgeon: [name], MD;  Location: [facility];  Service: Plastics;  Laterality: Bilateral;  . PORT-A-CATH REMOVAL Right 10/30/2013   Procedure: REMOVAL PORT-A-CATH;  Surgeon: [name], MD;  Location: [facility];  Service: General;  Laterality: Right;  . PORTACATH PLACEMENT Right 04/27/2013   Procedure: ULTRASOUND GUIDED PORT INSERTION WITH FLUOROSCOPY;  Surgeon: [name], MD;  Location[facility];  Service: General;  Laterality: Right;  . TOTAL MASTECTOMY Bilateral 10/30/2013   Procedure: TOTAL MASTECTOMY;  Surgeon: [name], MD;  Location: [facility];  Service: General;  Laterality: Bilateral;  . TUBAL LIGATION      FAMILY HISTORY Family History  Problem Relation Age of Onset  . Hypertension Mother   . Diabetes Mother   . Alzheimer's disease Mother   . Hypertension Father   . Diabetes Father   . Hypertension Brother   . Epilepsy Son 4       being worked up  for autism  . Thyroid disease Neg Hx   2 the patient's father died in his 83G from complications of diabetes. The patient's mother is living, in her mid 70s. The patient had 2 brothers, and 2 sisters. There is no history of cancer in the family to her  knowledge.  GYNECOLOGIC HISTORY:    (updated 10/03/2013)  Menarche age 89, first live birth age 38. The patient is GX P2. She is postmenopausal, but did have some perimenopausal bleeding, which was evaluated age 59 08/14/2012 with a transabdominal and transvaginal ultrasound of the pelvis which found a normal uterine myometrium and left ovary. The right ovary could not be visualized.  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 Her husband [name] also works for the [employer] system. Their children are [name], 12, and [place], 9. Brittney Vance's mother currently lives in the home as well. The patient attends a [employer].    ADVANCED DIRECTIVES: Not in place   HEALTH MAINTENANCE: (Updated 10/03/2013) Social History  Substance Use Topics  . Smoking status: Never Smoker  . Smokeless tobacco: Never Used  . Alcohol use 1.2 oz/week    2 Glasses of wine per week     Comment: occassion     Colonoscopy:- June 2016  PAP: UTD 2015  Bone density: Never  Lipid panel:     Allergies  Allergen Reactions  . Chocolate Itching  . Latex Itching  . Shrimp [Shellfish Allergy] Itching  . Wheat Bran Itching    Current Outpatient Prescriptions  Medication Sig Dispense Refill  . SYNTHROID 75 MCG tablet TAKE ONE TABLET BY MOUTH DAILY BEFORE BREAKFAST 30 tablet 9   No current facility-administered medications for this visit.     OBJECTIVE:  Middle-aged African-American woman who appears well   Vitals:   05/19/17 1049  BP: (!) 149/102  Pulse: 73  Resp: 20  Temp: 98.5 F (36.9 C)  SpO2: 100%  Body mass index is 25.53 kg/m.  ECOG: 1 Filed Weights   05/19/17 1049  Weight: 144 lb 1.6 oz (65.4 kg)   Sclerae unicteric, pupils round and equal Oropharynx clear and moist No cervical or supraclavicular adenopathy Lungs no rales or rhonchi Heart regular rate and rhythm Abd soft, nontender, positive bowel sounds MSK no  focal spinal tenderness, no upper extremity lymphedema Neuro: nonfocal, well oriented, appropriate affect Breasts: She is status post bilateral mastectomies, with bilateral reconstruction. There is no evidence of local recurrence. Both axillae are benign.   LAB RESULTS:  Lab Results  Component Value Date   WBC 5.0 05/19/2017   NEUTROABS 2.3 05/19/2017   HGB 13.0 05/19/2017   HCT 37.9 05/19/2017   MCV 88.5 05/19/2017   PLT 245 05/19/2017      Chemistry      Component Value Date/Time   NA 141 11/09/2016 0829   NA 143 04/26/2016 0911   K 3.9 11/09/2016 0829   K 3.4 (L) 04/26/2016 0911   CL 104 11/09/2016 0829   CO2 32 11/09/2016 0829   CO2 28 04/26/2016 0911   BUN 11 11/09/2016 0829   BUN 10.5 04/26/2016 0911   CREATININE 0.69 11/09/2016 0829   CREATININE 0.7 04/26/2016 0911      Component Value Date/Time   CALCIUM 9.9 11/09/2016 0829   CALCIUM 9.7 04/26/2016 0911   ALKPHOS 83 11/09/2016 0829   ALKPHOS 112 04/26/2016 0911   AST 17 11/09/2016 0829   AST 22 04/26/2016 0911  ALT 15 11/09/2016 0829   ALT 18 04/26/2016 0911   BILITOT 0.4 11/09/2016 0829   BILITOT 0.32 04/26/2016 0911       STUDIES: No results found.  ASSESSMENT: 51 y.o. BRCA negative [place] woman   (1)  status post left breast upper outer quadrant biopsy 04/06/2013 for a clinical T2 N1, stage IIB invasive ductal carcinoma, high-grade, triple negative, with an MIB-1 of 65%  (2) left axillary lymph node biopsy 04/24/2013 negative for malignancy  (3)  treated in the neoadjuvant setting:   (a) completed 4 dose dense cycles of doxorubicin/ cyclophosphamide 06/11/2013,.  (b)  completed 12 weekly cycles of carboplatin/ paclitaxel 09/24/2013  (4)  status post bilateral mastectomies 10/30/2013, showing benign results on the right and a complete pathologic response in the left, including both sentinel lymph nodes clear  (5) status post bilateral breast implant reconstructive surgery  (6) pedunculated  fibroid noted March 2016  (7) Right thyroid nodule noted July 2016  PLAN: Dai is now 3-1/2 years out from definitive surgery for her breast cancer with no evidence of disease recurrence. This is very favorable.  She is physically active through her work and family issues, but if she could possibly increase her exercise program and include more walking, I think that would be optimal.  She will see me again in a year. She knows to call for any other issues that may develop before that visit.   Bricyn Labrada, Virgie Dad, MD  05/19/17 11:00 AM [facility] [address] [place],  [address] Tel. [phone]    Fax. [phone]  This document serves as a record of services personally performed by Chauncey Cruel, MD. It was created on her behalf by Margit Banda, a trained medical scribe. The creation of this record is based on the scribe's personal observations and the provider's statements to them. This document has been checked and approved by the attending provider.

## 2017-05-18 ENCOUNTER — Other Ambulatory Visit: Payer: Self-pay | Admitting: *Deleted

## 2017-05-18 DIAGNOSIS — Z171 Estrogen receptor negative status [ER-]: Principal | ICD-10-CM

## 2017-05-18 DIAGNOSIS — C50412 Malignant neoplasm of upper-outer quadrant of left female breast: Secondary | ICD-10-CM

## 2017-05-19 ENCOUNTER — Telehealth: Payer: Self-pay | Admitting: Oncology

## 2017-05-19 ENCOUNTER — Ambulatory Visit (HOSPITAL_BASED_OUTPATIENT_CLINIC_OR_DEPARTMENT_OTHER): Payer: BC Managed Care – PPO | Admitting: Oncology

## 2017-05-19 ENCOUNTER — Other Ambulatory Visit (HOSPITAL_BASED_OUTPATIENT_CLINIC_OR_DEPARTMENT_OTHER): Payer: BC Managed Care – PPO

## 2017-05-19 ENCOUNTER — Other Ambulatory Visit: Payer: Self-pay | Admitting: Oncology

## 2017-05-19 VITALS — BP 149/102 | HR 73 | Temp 98.5°F | Resp 20 | Ht 63.0 in | Wt 144.1 lb

## 2017-05-19 DIAGNOSIS — Z171 Estrogen receptor negative status [ER-]: Principal | ICD-10-CM

## 2017-05-19 DIAGNOSIS — C50412 Malignant neoplasm of upper-outer quadrant of left female breast: Secondary | ICD-10-CM

## 2017-05-19 DIAGNOSIS — Z853 Personal history of malignant neoplasm of breast: Secondary | ICD-10-CM

## 2017-05-19 LAB — COMPREHENSIVE METABOLIC PANEL
ALT: 16 U/L (ref 0–55)
AST: 21 U/L (ref 5–34)
Albumin: 4 g/dL (ref 3.5–5.0)
Alkaline Phosphatase: 91 U/L (ref 40–150)
Anion Gap: 8 mEq/L (ref 3–11)
BUN: 7 mg/dL (ref 7.0–26.0)
CHLORIDE: 103 meq/L (ref 98–109)
CO2: 29 mEq/L (ref 22–29)
CREATININE: 0.7 mg/dL (ref 0.6–1.1)
Calcium: 9.9 mg/dL (ref 8.4–10.4)
EGFR: >60 mL/min/{1.73_m2} (ref >60)
GLUCOSE: 101 mg/dL (ref 70–140)
Potassium: 3.5 mEq/L (ref 3.5–5.1)
SODIUM: 140 meq/L (ref 136–145)
Total Bilirubin: 0.39 mg/dL (ref 0.20–1.20)
Total Protein: 7.7 g/dL (ref 6.4–8.3)

## 2017-05-19 LAB — CBC WITH DIFFERENTIAL/PLATELET
BASO%: 0.6 % (ref 0.0–2.0)
Basophils Absolute: 0 10*3/uL (ref 0.0–0.1)
EOS%: 2 % (ref 0.0–7.0)
Eosinophils Absolute: 0.1 10*3/uL (ref 0.0–0.5)
HCT: 37.9 % (ref 34.8–46.6)
HEMOGLOBIN: 13 g/dL (ref 11.6–15.9)
LYMPH#: 2.3 10*3/uL (ref 0.9–3.3)
LYMPH%: 45 % (ref 14.0–49.7)
MCH: 30.3 pg (ref 25.1–34.0)
MCHC: 34.3 g/dL (ref 31.5–36.0)
MCV: 88.5 fL (ref 79.5–101.0)
MONO#: 0.4 10*3/uL (ref 0.1–0.9)
MONO%: 7.8 % (ref 0.0–14.0)
NEUT#: 2.3 10*3/uL (ref 1.5–6.5)
NEUT%: 44.6 % (ref 38.4–76.8)
Platelets: 245 10*3/uL (ref 145–400)
RBC: 4.28 10*6/uL (ref 3.70–5.45)
RDW: 13.2 % (ref 11.2–14.5)
WBC: 5 10*3/uL (ref 3.9–10.3)

## 2017-05-19 NOTE — Telephone Encounter (Signed)
Gave patient AVS. Patient declined calendar will receive update in MyChart.

## 2017-06-06 ENCOUNTER — Institutional Professional Consult (permissible substitution): Payer: BC Managed Care – PPO | Admitting: Pulmonary Disease

## 2017-06-17 ENCOUNTER — Institutional Professional Consult (permissible substitution): Payer: BC Managed Care – PPO | Admitting: Internal Medicine

## 2017-06-22 ENCOUNTER — Encounter: Payer: Self-pay | Admitting: Oncology

## 2017-09-07 ENCOUNTER — Ambulatory Visit: Payer: BC Managed Care – PPO | Admitting: Family Medicine

## 2017-09-07 ENCOUNTER — Encounter: Payer: Self-pay | Admitting: Family Medicine

## 2017-09-07 VITALS — BP 144/99 | HR 101 | Temp 99.9°F | Ht 62.0 in | Wt 153.1 lb

## 2017-09-07 DIAGNOSIS — J208 Acute bronchitis due to other specified organisms: Secondary | ICD-10-CM | POA: Diagnosis not present

## 2017-09-07 DIAGNOSIS — B9689 Other specified bacterial agents as the cause of diseases classified elsewhere: Secondary | ICD-10-CM

## 2017-09-07 MED ORDER — AZITHROMYCIN 250 MG PO TABS: ORAL_TABLET | ORAL | 0 refills | Status: DC

## 2017-09-07 NOTE — Progress Notes (Signed)
Pre visit review using our clinic review tool, if applicable. No additional management support is needed unless otherwise documented below in the visit note. 

## 2017-09-07 NOTE — Progress Notes (Signed)
Chief Complaint  Patient presents with  . Cough  . Generalized Body Aches  . Sore Throat    [place] here for URI complaints.  Duration: 1 week  Associated symptoms: subjective fever, sinus congestion, rhinorrhea, sore throat, myalgia and cough Denies: sinus pain, ear pain, ear drainage, wheezing, shortness of breath and fevers Treatment to date: Tylenol Cold and Flu Severe Sick contacts: Yes - Works at an [employer].   ROS:  HEENT: As noted in HPI Lungs: No SOB  Past Medical History:  Diagnosis Date  . Breast cancer ([place]) 2015   left, triple negative   . Depression 09/23/2014  . Fibroids   . Hypertension   . Hypothyroid    Family History  Problem Relation Age of Onset  . Hypertension Mother   . Diabetes Mother   . Alzheimer's disease Mother   . Hypertension Father   . Diabetes Father   . Hypertension Brother   . Epilepsy Son 4       being worked up for autism  . Thyroid disease Neg Hx     BP (!) 144/99 (BP Location: Left Arm, Patient Position: Sitting, Cuff Size: Normal)   Pulse (!) 101   Temp 99.9 F (37.7 C) (Oral)   Ht 5\' 2"  (1.575 m)   Wt 153 lb 2 oz (69.5 kg)   LMP 03/10/2013   SpO2 96%   BMI 28.01 kg/m  General: Awake, alert, appears stated age 52: AT, [place], ears patent b/l and TM's neg, nares patent w/o discharge, pharynx pink and without exudates, MMM Neck: No masses or asymmetry Heart: RRR Lungs: CTAB, no accessory muscle use Psych: Age appropriate judgment and insight, flat affect  Acute bacterial bronchitis - Plan: azithromycin (ZITHROMAX) 250 MG tablet  Orders as above. S/s's sound like flu, she may need 1-2 more days to improve. If she improves, hold off on abx, if not, take. Letter for work given stating ok to return 49 or sooner if she is feeling better. Will overlook BP given the home medicine she has been taking. Tylenol, ibuprofen.  Continue to push fluids, practice good hand hygiene, cover mouth when coughing. F/u prn. If  starting to experience fevers, shaking, or shortness of breath, seek immediate care. Pt voiced understanding and agreement to the plan.  [place], DO 09/07/17 8:58 AM

## 2017-09-07 NOTE — Patient Instructions (Addendum)
Continue to push fluids, practice good hand hygiene, and cover your mouth if you cough.  If you start having fevers, shaking or shortness of breath, seek immediate care.  Ibuprofen 400-600 mg (2-3 over the counter strength tabs) every 6 hours as needed for pain.  OK to take Tylenol 1000 mg (2 extra strength tabs) or 975 mg (3 regular strength tabs) every 6 hours as needed.   Wait 1-2 days before taking antibiotic.  Let us know if you need anything.

## 2017-10-08 ENCOUNTER — Other Ambulatory Visit (HOSPITAL_COMMUNITY)
Admit: 2017-10-08 | Discharge: 2017-10-08 | Disposition: A | Discharge status: Home / Self Care | Payer: BC Managed Care – PPO | Source: Other Acute Inpatient Hospital | Attending: Family Medicine | Admitting: Family Medicine

## 2017-10-08 ENCOUNTER — Ambulatory Visit: Payer: BC Managed Care – PPO | Admitting: Family Medicine

## 2017-10-08 ENCOUNTER — Encounter: Payer: Self-pay | Admitting: Family Medicine

## 2017-10-08 ENCOUNTER — Encounter: Payer: Self-pay | Admitting: *Deleted

## 2017-10-08 VITALS — BP 140/82 | HR 92 | Temp 99.1°F | Resp 12 | Ht 62.0 in | Wt 146.0 lb

## 2017-10-08 DIAGNOSIS — R059 Cough, unspecified: Secondary | ICD-10-CM

## 2017-10-08 DIAGNOSIS — J069 Acute upper respiratory infection, unspecified: Secondary | ICD-10-CM

## 2017-10-08 DIAGNOSIS — J029 Acute pharyngitis, unspecified: Secondary | ICD-10-CM | POA: Insufficient documentation

## 2017-10-08 DIAGNOSIS — R05 Cough: Secondary | ICD-10-CM

## 2017-10-08 LAB — POC INFLUENZA A&B (BINAX/QUICKVUE)
INFLUENZA B, POC: NEGATIVE
Influenza A, POC: NEGATIVE

## 2017-10-08 LAB — POCT RAPID STREP A (OFFICE): Rapid Strep A Screen: NEGATIVE

## 2017-10-08 MED ORDER — MAGIC MOUTHWASH W/LIDOCAINE: 5.0000 mL | Freq: Three times a day (TID) | ORAL | 0 refills | Status: AC | PRN

## 2017-10-08 MED ORDER — BENZONATATE 100 MG PO CAPS: 200.0000 mg | ORAL_CAPSULE | Freq: Two times a day (BID) | ORAL | 0 refills | Status: AC | PRN

## 2017-10-08 NOTE — Patient Instructions (Signed)
  Ms.Kaleena M Vasconcelos I have seen you today for an acute visit.  A few things to remember from today's visit:   Sore throat - Plan: POCT rapid strep A, POC Influenza A&B(BINAX/QUICKVUE), Culture, Group A Strep, magic mouthwash w/lidocaine SOLN  Cough - Plan: benzonatate (TESSALON) 100 MG capsule  URI, acute   Medications prescribed today are intended for short period of time and will not be refill upon request, a follow up appointment might be necessary to discuss continuation of of treatment if appropriate.   Symptomatic treatment: Over the counter Acetaminophen 500 mg and/or Ibuprofen (400-600 mg) if there is not contraindications; you can alternate in between both every 4-6 hours. Gargles with saline water and throat lozenges might also help. Cold fluids.    Symptoms are most likely viral and self-limited. Good hand hygiene is important, decrease risk of transmission. Small and frequent sips of clear fluids, Pedialyte or Gatorade age good options.  BRAT diet: bananas, rice, applesauce, and toast.     Notified immediately or seek medical attention if you cannot keep fluids down, decreased urine output, severe abdominal pain, red bright blood per rectum, or changes in mental status.         Seek prompt medical evaluation if you are having difficulty breathing, mouth swelling, throat closing up, not able to swallow liquids (drooling), skin rash/bruising, or worsening symptoms.  Please follow up in 2 weeks if not any better.     In general please monitor for signs of worsening symptoms and seek immediate medical attention if any concerning.   I hope you get better soon!

## 2017-10-08 NOTE — Progress Notes (Signed)
ACUTE VISIT  HPI:  Chief Complaint  Patient presents with  . Sore Throat  . Cough  . Diarrhea    [name] is a 52 y.o.female here today complaining of 2-3 days of respiratory symptoms.  3 days of sore throat, burning-like sensation. She denies dysphasia. Pain exacerbated by swallowing, alleviated by drinking warm liquids. Yesterday she started with diarrhea, she has had about 2 episodes today. No blood or mucus in stool.  She denies associated nausea, vomiting, or abdominal pain.  URI   This is a new problem. The current episode started in the past 7 days. The problem has been unchanged. There has been no fever. Associated symptoms include congestion, coughing, diarrhea, rhinorrhea and a sore throat. Pertinent negatives include no abdominal pain, ear pain, headaches, nausea, rash, sneezing, swollen glands, vomiting or wheezing. She has tried acetaminophen for the symptoms. The treatment provided mild relief.     Nonproductive cough.  No Hx of recent travel. No known sick contact but she works at [employer] and some kids have been coughing. No known insect bite.  Hx of allergies: None  OTC medications for this problem: Mucinex and Tylenol.    Review of Systems  Constitutional: Positive for chills and fatigue. Negative for activity change, appetite change and fever.  HENT: Positive for congestion, postnasal drip, rhinorrhea and sore throat. Negative for ear pain, mouth sores, sinus pressure, sneezing, trouble swallowing and voice change.   Eyes: Negative for discharge, redness and itching.  Respiratory: Positive for cough. Negative for shortness of breath and wheezing.   Cardiovascular: Negative for leg swelling.  Gastrointestinal: Positive for diarrhea. Negative for abdominal pain, nausea and vomiting.  Musculoskeletal: Positive for myalgias. Negative for gait problem.  Skin: Negative for rash.  Allergic/Immunologic: Negative for environmental  allergies.  Neurological: Negative for syncope, weakness and headaches.  Hematological: Negative for adenopathy. Does not bruise/bleed easily.      Current Outpatient Medications on File Prior to Visit  Medication Sig Dispense Refill  . SYNTHROID 75 MCG tablet TAKE ONE TABLET BY MOUTH DAILY BEFORE BREAKFAST 30 tablet 9   No current facility-administered medications on file prior to visit.      Past Medical History:  Diagnosis Date  . Breast cancer ([place]) 2015   left, triple negative   . Depression 09/23/2014  . Fibroids   . Hypertension   . Hypothyroid    Allergies  Allergen Reactions  . Chocolate Itching  . Latex Itching  . Shrimp [Shellfish Allergy] Itching  . Wheat Bran Itching    Social History   Socioeconomic History  . Marital status: Married    Spouse name: [name]   . Number of children: 2  . Years of education: Masters   . Highest education level: None  Social Needs  . Financial resource strain: None  . Food insecurity - worry: None  . Food insecurity - inability: None  . Transportation needs - medical: None  . Transportation needs - non-medical: None  Occupational History  . Occupation: Chiropodist: UNEMPLOYED    Comment: [employer]  Tobacco Use  . Smoking status: Never Smoker  . Smokeless tobacco: Never Used  Substance and Sexual Activity  . Alcohol use: Yes    Alcohol/week: 1.2 oz    Types: 2 Glasses of wine per week    Comment: occassion  . Drug use: No  . Sexual activity: Yes    Partners: Male  Birth control/protection: Post-menopausal    Comment: menarche age 47, first birth age 67, P2, post menopausal  Other Topics Concern  . None  Social History Narrative   1-2 caffeine drinks per day. No regular exercise.    Vitals:   10/08/17 1250 10/08/17 1323  BP: 140/82   Pulse: (!) 104 92  Resp: 12   Temp: 99.1 F (37.3 C)   SpO2: 98%    Body mass index is 26.7 kg/m.    Physical Exam  Nursing note and vitals  reviewed. Constitutional: She is oriented to person, place, and time. She appears well-developed. She does not appear ill. No distress.  HENT:  Head: Normocephalic and atraumatic.  Right Ear: Tympanic membrane, external ear and ear canal normal.  Left Ear: Tympanic membrane, external ear and ear canal normal.  Mouth/Throat: Uvula is midline and mucous membranes are normal. Posterior oropharyngeal erythema (Mild) present. No oropharyngeal exudate or posterior oropharyngeal edema.  Hypertrophic turbinates. Postnasal drainage  Eyes: Conjunctivae are normal.  Neck: No muscular tenderness present. No edema and no erythema present.  Cardiovascular: Normal rate and regular rhythm.  No murmur heard. Respiratory: Effort normal and breath sounds normal. No stridor. No respiratory distress.  GI: Soft. Bowel sounds are normal. She exhibits no mass. There is no tenderness.  Musculoskeletal: She exhibits no edema.  Lymphadenopathy:       Head (right side): No submandibular adenopathy present.       Head (left side): No submandibular adenopathy present.    She has no cervical adenopathy.  Neurological: She is alert and oriented to person, place, and time. She has normal strength.  Skin: Skin is warm. No rash noted. No erythema.  Psychiatric: She has a normal mood and affect. Her speech is normal.  Well groomed, good eye contact.      ASSESSMENT AND PLAN:  [name] was seen today for sore throat, cough and diarrhea.  Diagnoses and all orders for this visit:  Sore throat  Rapid strep here in the office was negative. We will follow strep culture. Symptomatic treatment recommended for now. Instructed about warning signs.  -     POCT rapid strep A -     POC Influenza A&B(BINAX/QUICKVUE) -     Culture, Group A Strep -     magic mouthwash w/lidocaine SOLN; Take 5 mLs by mouth 3 (three) times daily as needed for up to 10 days for mouth pain. 50 ml of diphenhydramine, alum and mag hydroxide, and  lidocaine to make 150 ml  Cough -     benzonatate (TESSALON) 100 MG capsule; Take 2 capsules (200 mg total) by mouth 2 (two) times daily as needed for up to 10 days.  URI, acute  Symptoms suggests a viral etiology, symptomatic treatment recommended at this time. Instructed to monitor for signs of complications. I also explained that cough can last a few days and sometimes weeks. F/U as needed.     -Ms. Newell Coral Knickerbocker was advised to seek attention immediately if symptoms worsen or to follow if they persist or new concerns arise.       Rohaan Durnil G. [place], MD  [facility]. [place] office.

## 2017-10-10 ENCOUNTER — Telehealth: Payer: Self-pay | Admitting: Family Medicine

## 2017-10-10 NOTE — Telephone Encounter (Signed)
Copied from [place]. Topic: Quick Communication - See Telephone Encounter >> Oct 10, 2017 10:23 AM Aurelio Brash B wrote: CRM for notification. See Telephone encounter for:  Loletha Grayer from [facility] is requesting a call  about mouthwash,  he states it needs to be tweaked His contact number is [phone] 10/10/17.

## 2017-10-10 NOTE — Telephone Encounter (Signed)
Called and discussed with pharmacist, resolved issue

## 2017-10-12 LAB — MISC LABCORP TEST (SEND OUT): Labcorp test code: 8169

## 2017-11-02 ENCOUNTER — Other Ambulatory Visit: Payer: Self-pay | Admitting: Endocrinology

## 2017-12-18 ENCOUNTER — Other Ambulatory Visit: Payer: Self-pay | Admitting: Endocrinology

## 2017-12-23 ENCOUNTER — Encounter: Payer: Self-pay | Admitting: *Deleted

## 2017-12-23 ENCOUNTER — Encounter: Payer: Self-pay | Admitting: Oncology

## 2018-01-18 ENCOUNTER — Other Ambulatory Visit: Payer: Self-pay | Admitting: Endocrinology

## 2018-01-23 ENCOUNTER — Encounter: Payer: BC Managed Care – PPO | Admitting: Family Medicine
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
## 2018-02-15 ENCOUNTER — Encounter: Payer: Self-pay | Admitting: Family Medicine

## 2018-02-15 ENCOUNTER — Ambulatory Visit (INDEPENDENT_AMBULATORY_CARE_PROVIDER_SITE_OTHER): Payer: BC Managed Care – PPO | Admitting: Family Medicine

## 2018-02-15 VITALS — BP 146/72 | HR 79 | Temp 97.9°F | Resp 16 | Ht 62.0 in | Wt 144.0 lb

## 2018-02-15 DIAGNOSIS — Z853 Personal history of malignant neoplasm of breast: Secondary | ICD-10-CM | POA: Diagnosis not present

## 2018-02-15 DIAGNOSIS — Z130 Encounter for screening for diseases of the blood and blood-forming organs and certain disorders involving the immune mechanism: Secondary | ICD-10-CM

## 2018-02-15 DIAGNOSIS — E034 Atrophy of thyroid (acquired): Secondary | ICD-10-CM

## 2018-02-15 DIAGNOSIS — G4733 Obstructive sleep apnea (adult) (pediatric): Secondary | ICD-10-CM | POA: Diagnosis not present

## 2018-02-15 DIAGNOSIS — E785 Hyperlipidemia, unspecified: Secondary | ICD-10-CM

## 2018-02-15 DIAGNOSIS — Z0000 Encounter for general adult medical examination without abnormal findings: Secondary | ICD-10-CM | POA: Diagnosis not present

## 2018-02-15 DIAGNOSIS — Z131 Encounter for screening for diabetes mellitus: Secondary | ICD-10-CM | POA: Diagnosis not present

## 2018-02-15 LAB — COMPREHENSIVE METABOLIC PANEL
ALT: 14 U/L (ref 0–35)
AST: 16 U/L (ref 0–37)
Albumin: 4.5 g/dL (ref 3.5–5.2)
Alkaline Phosphatase: 88 U/L (ref 39–117)
BUN: 14 mg/dL (ref 6–23)
CO2: 31 meq/L (ref 19–32)
Calcium: 10.1 mg/dL (ref 8.4–10.5)
Chloride: 105 mEq/L (ref 96–112)
Creatinine, Ser: 0.63 mg/dL (ref 0.40–1.20)
GFR: 126.98 mL/min (ref >60.00)
GLUCOSE: 92 mg/dL (ref 70–99)
POTASSIUM: 4.8 meq/L (ref 3.5–5.1)
Sodium: 142 mEq/L (ref 135–145)
Total Bilirubin: 0.3 mg/dL (ref 0.2–1.2)
Total Protein: 7.8 g/dL (ref 6.0–8.3)

## 2018-02-15 LAB — CBC
HEMATOCRIT: 39.6 % (ref 36.0–46.0)
HEMOGLOBIN: 13.4 g/dL (ref 12.0–15.0)
MCHC: 33.8 g/dL (ref 30.0–36.0)
MCV: 89.6 fl (ref 78.0–100.0)
Platelets: 260 10*3/uL (ref 150.0–400.0)
RBC: 4.42 Mil/uL (ref 3.87–5.11)
RDW: 13 % (ref 11.5–15.5)
WBC: 5.5 10*3/uL (ref 4.0–10.5)

## 2018-02-15 LAB — TSH: TSH: 0.97 u[IU]/mL (ref 0.35–4.50)

## 2018-02-15 LAB — LIPID PANEL
CHOL/HDL RATIO: 4
Cholesterol: 247 mg/dL — ABNORMAL HIGH (ref 0–200)
HDL: 68.7 mg/dL (ref >39.00)
LDL Cholesterol: 161 mg/dL — ABNORMAL HIGH (ref 0–99)
NONHDL: 178.15
Triglycerides: 84 mg/dL (ref 0.0–149.0)
VLDL: 16.8 mg/dL (ref 0.0–40.0)

## 2018-02-15 LAB — T4, FREE: Free T4: 0.87 ng/dL (ref 0.60–1.60)

## 2018-02-15 LAB — HEMOGLOBIN A1C: Hgb A1c MFr Bld: 5.8 % (ref 4.6–6.5)

## 2018-02-15 NOTE — Patient Instructions (Addendum)
It was good to see you today- take care and I will be in touch with your labs asap  Please do monitor your BP at home- if running higher than 140 for top number we need to consider a blood pressure medication  Health Maintenance for Postmenopausal Women Menopause is a normal process in which your reproductive ability comes to an end. This process happens gradually over a span of months to years, usually between the ages of 80 and 74. Menopause is complete when you have missed 12 consecutive menstrual periods. It is important to talk with your health care provider about some of the most common conditions that affect postmenopausal women, such as heart disease, cancer, and bone loss (osteoporosis). Adopting a healthy lifestyle and getting preventive care can help to promote your health and wellness. Those actions can also lower your chances of developing some of these common conditions. What should I know about menopause? During menopause, you may experience a number of symptoms, such as:  Moderate-to-severe hot flashes.  Night sweats.  Decrease in sex drive.  Mood swings.  Headaches.  Tiredness.  Irritability.  Memory problems.  Insomnia.  Choosing to treat or not to treat menopausal changes is an individual decision that you make with your health care provider. What should I know about hormone replacement therapy and supplements? Hormone therapy products are effective for treating symptoms that are associated with menopause, such as hot flashes and night sweats. Hormone replacement carries certain risks, especially as you become older. If you are thinking about using estrogen or estrogen with progestin treatments, discuss the benefits and risks with your health care provider. What should I know about heart disease and stroke? Heart disease, heart attack, and stroke become more likely as you age. This may be due, in part, to the hormonal changes that your body experiences during  menopause. These can affect how your body processes dietary fats, triglycerides, and cholesterol. Heart attack and stroke are both medical emergencies. There are many things that you can do to help prevent heart disease and stroke:  Have your blood pressure checked at least every 1-2 years. High blood pressure causes heart disease and increases the risk of stroke.  If you are 68-46 years old, ask your health care provider if you should take aspirin to prevent a heart attack or a stroke.  Do not use any tobacco products, including cigarettes, chewing tobacco, or electronic cigarettes. If you need help quitting, ask your health care provider.  It is important to eat a healthy diet and maintain a healthy weight. ? Be sure to include plenty of vegetables, fruits, low-fat dairy products, and lean protein. ? Avoid eating foods that are high in solid fats, added sugars, or salt (sodium).  Get regular exercise. This is one of the most important things that you can do for your health. ? Try to exercise for at least 150 minutes each week. The type of exercise that you do should increase your heart rate and make you sweat. This is known as moderate-intensity exercise. ? Try to do strengthening exercises at least twice each week. Do these in addition to the moderate-intensity exercise.  Know your numbers.Ask your health care provider to check your cholesterol and your blood glucose. Continue to have your blood tested as directed by your health care provider.  What should I know about cancer screening? There are several types of cancer. Take the following steps to reduce your risk and to catch any cancer development as early as  possible. Breast Cancer  Practice breast self-awareness. ? This means understanding how your breasts normally appear and feel. ? It also means doing regular breast self-exams. Let your health care provider know about any changes, no matter how small.  If you are 55 or older,  have a clinician do a breast exam (clinical breast exam or CBE) every year. Depending on your age, family history, and medical history, it may be recommended that you also have a yearly breast X-ray (mammogram).  If you have a family history of breast cancer, talk with your health care provider about genetic screening.  If you are at high risk for breast cancer, talk with your health care provider about having an MRI and a mammogram every year.  Breast cancer (BRCA) gene test is recommended for women who have family members with BRCA-related cancers. Results of the assessment will determine the need for genetic counseling and BRCA1 and for BRCA2 testing. BRCA-related cancers include these types: ? Breast. This occurs in males or females. ? Ovarian. ? Tubal. This may also be called fallopian tube cancer. ? Cancer of the abdominal or pelvic lining (peritoneal cancer). ? Prostate. ? Pancreatic.  Cervical, Uterine, and Ovarian Cancer Your health care provider may recommend that you be screened regularly for cancer of the pelvic organs. These include your ovaries, uterus, and vagina. This screening involves a pelvic exam, which includes checking for microscopic changes to the surface of your cervix (Pap test).  For women ages 21-65, health care providers may recommend a pelvic exam and a Pap test every three years. For women ages 74-65, they may recommend the Pap test and pelvic exam, combined with testing for human papilloma virus (HPV), every five years. Some types of HPV increase your risk of cervical cancer. Testing for HPV may also be done on women of any age who have unclear Pap test results.  Other health care providers may not recommend any screening for nonpregnant women who are considered low risk for pelvic cancer and have no symptoms. Ask your health care provider if a screening pelvic exam is right for you.  If you have had past treatment for cervical cancer or a condition that could  lead to cancer, you need Pap tests and screening for cancer for at least 20 years after your treatment. If Pap tests have been discontinued for you, your risk factors (such as having a new sexual partner) need to be reassessed to determine if you should start having screenings again. Some women have medical problems that increase the chance of getting cervical cancer. In these cases, your health care provider may recommend that you have screening and Pap tests more often.  If you have a family history of uterine cancer or ovarian cancer, talk with your health care provider about genetic screening.  If you have vaginal bleeding after reaching menopause, tell your health care provider.  There are currently no reliable tests available to screen for ovarian cancer.  Lung Cancer Lung cancer screening is recommended for adults 59-51 years old who are at high risk for lung cancer because of a history of smoking. A yearly low-dose CT scan of the lungs is recommended if you:  Currently smoke.  Have a history of at least 30 pack-years of smoking and you currently smoke or have quit within the past 15 years. A pack-year is smoking an average of one pack of cigarettes per day for one year.  Yearly screening should:  Continue until it has been 15 years  since you quit.  Stop if you develop a health problem that would prevent you from having lung cancer treatment.  Colorectal Cancer  This type of cancer can be detected and can often be prevented.  Routine colorectal cancer screening usually begins at age 29 and continues through age 86.  If you have risk factors for colon cancer, your health care provider may recommend that you be screened at an earlier age.  If you have a family history of colorectal cancer, talk with your health care provider about genetic screening.  Your health care provider may also recommend using home test kits to check for hidden blood in your stool.  A small camera at the  end of a tube can be used to examine your colon directly (sigmoidoscopy or colonoscopy). This is done to check for the earliest forms of colorectal cancer.  Direct examination of the colon should be repeated every 5-10 years until age 80. However, if early forms of precancerous polyps or small growths are found or if you have a family history or genetic risk for colorectal cancer, you may need to be screened more often.  Skin Cancer  Check your skin from head to toe regularly.  Monitor any moles. Be sure to tell your health care provider: ? About any new moles or changes in moles, especially if there is a change in a mole's shape or color. ? If you have a mole that is larger than the size of a pencil eraser.  If any of your family members has a history of skin cancer, especially at a young age, talk with your health care provider about genetic screening.  Always use sunscreen. Apply sunscreen liberally and repeatedly throughout the day.  Whenever you are outside, protect yourself by wearing long sleeves, pants, a wide-brimmed hat, and sunglasses.  What should I know about osteoporosis? Osteoporosis is a condition in which bone destruction happens more quickly than new bone creation. After menopause, you may be at an increased risk for osteoporosis. To help prevent osteoporosis or the bone fractures that can happen because of osteoporosis, the following is recommended:  If you are 33-2 years old, get at least 1,000 mg of calcium and at least 600 mg of vitamin D per day.  If you are older than age 64 but younger than age 34, get at least 1,200 mg of calcium and at least 600 mg of vitamin D per day.  If you are older than age 68, get at least 1,200 mg of calcium and at least 800 mg of vitamin D per day.  Smoking and excessive alcohol intake increase the risk of osteoporosis. Eat foods that are rich in calcium and vitamin D, and do weight-bearing exercises several times each week as directed  by your health care provider. What should I know about how menopause affects my mental health? Depression may occur at any age, but it is more common as you become older. Common symptoms of depression include:  Low or sad mood.  Changes in sleep patterns.  Changes in appetite or eating patterns.  Feeling an overall lack of motivation or enjoyment of activities that you previously enjoyed.  Frequent crying spells.  Talk with your health care provider if you think that you are experiencing depression. What should I know about immunizations? It is important that you get and maintain your immunizations. These include:  Tetanus, diphtheria, and pertussis (Tdap) booster vaccine.  Influenza every year before the flu season begins.  Pneumonia vaccine.  Shingles vaccine.  Your health care provider may also recommend other immunizations. This information is not intended to replace advice given to you by your health care provider. Make sure you discuss any questions you have with your health care provider. Document Released: 09/03/2005 Document Revised: 01/30/2016 Document Reviewed: 04/15/2015 Elsevier Interactive Patient Education  2018 [employer].

## 2018-02-17 ENCOUNTER — Other Ambulatory Visit: Payer: Self-pay | Admitting: Endocrinology

## 2018-02-17 ENCOUNTER — Encounter: Payer: Self-pay | Admitting: Endocrinology

## 2018-02-17 ENCOUNTER — Ambulatory Visit: Payer: BC Managed Care – PPO | Admitting: Endocrinology

## 2018-02-17 VITALS — BP 130/92 | HR 83 | Ht 62.0 in | Wt 144.6 lb

## 2018-02-17 DIAGNOSIS — E038 Other specified hypothyroidism: Secondary | ICD-10-CM | POA: Diagnosis not present

## 2018-02-17 MED ORDER — LEVOTHYROXINE SODIUM 75 MCG PO TABS: 75.0000 ug | ORAL_TABLET | Freq: Every day | ORAL | 3 refills | Status: DC

## 2018-02-17 NOTE — Progress Notes (Signed)
Subjective:    Patient ID: Brittney Vance, female    DOB: Oct 31, 1965, 52 y.o.   MRN: [id number]  HPI Pt returns for f/u of hypothyroidism (dx'ed 2006, in the postpartum state; she has been on synthroid since then; [place] in 2016 showed single right-sided nodule, 9 mm x 7 mm x 9 mm; f/u US is 2018 was unchanged).  pt states she feels well in general.  She takes synthroid as rx'ed.  She does not notice the goiter.   Past Medical History:  Diagnosis Date  . Breast cancer ([place]) 2015   left, triple negative   . Depression 09/23/2014  . Fibroids   . Hypertension   . Hypothyroid     Past Surgical History:  Procedure Laterality Date  . AXILLARY LYMPH NODE BIOPSY Left 10/30/2013   Procedure: AXILLARY LYMPH NODE BIOPSY;  Surgeon: [name], MD;  Location: [facility];  Service: General;  Laterality: Left;  . BREAST BIOPSY Left 04/24/2013  . BREAST RECONSTRUCTION WITH PLACEMENT OF TISSUE EXPANDER AND FLEX HD (ACELLULAR HYDRATED DERMIS) Bilateral 10/30/2013   Procedure: BILATERAL BREAST RECONSTRUCTION WITH PLACEMENT OF TISSUE EXPANDER AND FLEX HD (ACELLULAR HYDRATED DERMIS);  Surgeon: [name], MD;  Location: [facility];  Service: Plastics;  Laterality: Bilateral;  . PORT-A-CATH REMOVAL Right 10/30/2013   Procedure: REMOVAL PORT-A-CATH;  Surgeon: [name], MD;  Location: [facility];  Service: General;  Laterality: Right;  . PORTACATH PLACEMENT Right 04/27/2013   Procedure: ULTRASOUND GUIDED PORT INSERTION WITH FLUOROSCOPY;  Surgeon: [name], MD;  Location: [facility];  Service: General;  Laterality: Right;  . TOTAL MASTECTOMY Bilateral 10/30/2013   Procedure: TOTAL MASTECTOMY;  Surgeon: [name], MD;  Location: [facility];  Service: General;  Laterality: Bilateral;  . TUBAL LIGATION      Social History   Socioeconomic History  . Marital status: Married    Spouse name: [name]   . Number of children: 2  . Years of education: Masters   . Highest education level: Not on file  Occupational  History  . Occupation: Chiropodist: UNEMPLOYED    Comment: [employer]  . Financial resource strain: Not on file  . Food insecurity:    Worry: Not on file    Inability: Not on file  . Transportation needs:    Medical: Not on file    Non-medical: Not on file  Tobacco Use  . Smoking status: Never Smoker  . Smokeless tobacco: Never Used  Substance and Sexual Activity  . Alcohol use: Yes    Alcohol/week: 1.2 oz    Types: 2 Glasses of wine per week    Comment: occassion  . Drug use: No  . Sexual activity: Yes    Partners: Male    Birth control/protection: Post-menopausal    Comment: menarche age 74, first birth age 28, P2, post menopausal  Lifestyle  . Physical activity:    Days per week: Not on file    Minutes per session: Not on file  . Stress: Not on file  Relationships  . Social connections:    Talks on phone: Not on file    Gets together: Not on file    Attends religious service: Not on file    Active member of club or organization: Not on file    Attends meetings of clubs or organizations: Not on file    Relationship status: Not on file  . Intimate partner violence:  Fear of current or ex partner: Not on file    Emotionally abused: Not on file    Physically abused: Not on file    Forced sexual activity: Not on file  Other Topics Concern  . Not on file  Social History Narrative   1-2 caffeine drinks per day. No regular exercise.    No current outpatient medications on file prior to visit.   No current facility-administered medications on file prior to visit.     Allergies  Allergen Reactions  . Chocolate Itching  . Latex Itching  . Shrimp [Shellfish Allergy] Itching  . Wheat Bran Itching    Family History  Problem Relation Age of Onset  . Hypertension Mother   . Diabetes Mother   . Alzheimer's disease Mother   . Hypertension Father   . Diabetes Father   . Hypertension Brother   . Epilepsy Son 4       being worked up  for autism  . Thyroid disease Neg Hx     BP (!) 130/92 (BP Location: Right Arm, Patient Position: Sitting, Cuff Size: Normal)   Pulse 83   Ht 5\' 2"  (1.575 m)   Wt 144 lb 9.6 oz (65.6 kg)   LMP 03/10/2013   SpO2 99%   BMI 26.45 kg/m   Review of Systems Denies neck pain    Objective:   Physical Exam VITAL SIGNS:  See vs page GENERAL: no distress NECK: There is no palpable thyroid enlargement.  No thyroid nodule is palpable.  No palpable lymphadenopathy at the anterior neck.  Lab Results  Component Value Date   TSH 0.97 02/15/2018       Assessment & Plan:  Small multinodular goiter, non-palpable Hypothyroidism: well-replaced.  Patient Instructions  Please continue the same medication.  You can skip the ultrasound this year.  Please come back for a follow-up appointment in 1 year.

## 2018-02-17 NOTE — Patient Instructions (Addendum)
Please continue the same medication.  You can skip the ultrasound this year.  Please come back for a follow-up appointment in 1 year.

## 2018-03-23 ENCOUNTER — Encounter: Payer: Self-pay | Admitting: Family Medicine

## 2018-03-23 ENCOUNTER — Telehealth: Payer: Self-pay | Admitting: Family Medicine

## 2018-03-23 NOTE — Telephone Encounter (Unsigned)
Copied from [place] [id number]. Topic: Quick Communication - See Telephone Encounter >> Mar 23, 2018 12:23 PM Neva Seat wrote: Mother passed on the 03-11-2023- needing a grief letter for work.

## 2018-04-14 ENCOUNTER — Telehealth: Payer: Self-pay | Admitting: Family Medicine

## 2018-04-14 NOTE — Telephone Encounter (Signed)
Pt came in to drop off FMLA paperwork for Dr. Lorelei Pont to fill out. Pt would like a call when it is ready and she will come pick it up

## 2018-04-17 NOTE — Telephone Encounter (Signed)
Do we have this paperwork?  It did not come to my desk. Thank you!

## 2018-04-19 ENCOUNTER — Encounter: Payer: Self-pay | Admitting: Family Medicine

## 2018-04-20 NOTE — Telephone Encounter (Signed)
Ivin Booty do you have?

## 2018-04-21 NOTE — Telephone Encounter (Signed)
Yes, I do have the paperwork, I have been a little backed up on disability papers [received on the 20th, so 5th day today]. I am working on it  And will forward it over for Dr. Lorelei Pont to have & sign on Monday when she RTO/SLS 09/27

## 2018-04-21 NOTE — Telephone Encounter (Signed)
Conversation: Non-Urgent Medical Question  (Newest Message First)  April 21, 2018    7:37 AM  You routed this conversation to Loleta Chance, [place]  Me   7:34 AM  Note    Yes, I do have the paperwork, I have been a little backed up on disability papers [received on the 20th, so 5th day today]. I am working on it  And will forward it over for Dr. Lorelei Pont to have & sign on Monday when she RTO/SLS 09/27

## 2018-04-22 ENCOUNTER — Encounter: Payer: Self-pay | Admitting: Family Medicine

## 2018-05-13 ENCOUNTER — Encounter: Payer: Self-pay | Admitting: Family Medicine

## 2018-05-13 DIAGNOSIS — Z7689 Persons encountering health services in other specified circumstances: Secondary | ICD-10-CM

## 2018-05-17 ENCOUNTER — Other Ambulatory Visit: Payer: Self-pay

## 2018-05-17 DIAGNOSIS — C50412 Malignant neoplasm of upper-outer quadrant of left female breast: Secondary | ICD-10-CM

## 2018-05-17 DIAGNOSIS — Z171 Estrogen receptor negative status [ER-]: Principal | ICD-10-CM

## 2018-05-18 ENCOUNTER — Telehealth: Payer: Self-pay | Admitting: Oncology

## 2018-05-18 ENCOUNTER — Inpatient Hospital Stay: Payer: BC Managed Care – PPO | Attending: Oncology

## 2018-05-18 ENCOUNTER — Inpatient Hospital Stay: Payer: BC Managed Care – PPO | Admitting: Oncology

## 2018-05-18 ENCOUNTER — Encounter: Payer: Self-pay | Admitting: Oncology

## 2018-05-18 VITALS — BP 122/88 | HR 84 | Temp 98.2°F | Resp 18 | Ht 62.0 in | Wt 148.0 lb

## 2018-05-18 DIAGNOSIS — Z171 Estrogen receptor negative status [ER-]: Secondary | ICD-10-CM | POA: Insufficient documentation

## 2018-05-18 DIAGNOSIS — I10 Essential (primary) hypertension: Secondary | ICD-10-CM | POA: Diagnosis not present

## 2018-05-18 DIAGNOSIS — Z421 Encounter for breast reconstruction following mastectomy: Secondary | ICD-10-CM

## 2018-05-18 DIAGNOSIS — Z79899 Other long term (current) drug therapy: Secondary | ICD-10-CM | POA: Diagnosis not present

## 2018-05-18 DIAGNOSIS — Z853 Personal history of malignant neoplasm of breast: Secondary | ICD-10-CM | POA: Diagnosis not present

## 2018-05-18 DIAGNOSIS — C50412 Malignant neoplasm of upper-outer quadrant of left female breast: Secondary | ICD-10-CM

## 2018-05-18 DIAGNOSIS — Z9221 Personal history of antineoplastic chemotherapy: Secondary | ICD-10-CM | POA: Diagnosis not present

## 2018-05-18 DIAGNOSIS — E039 Hypothyroidism, unspecified: Secondary | ICD-10-CM | POA: Diagnosis not present

## 2018-05-18 DIAGNOSIS — Z9013 Acquired absence of bilateral breasts and nipples: Secondary | ICD-10-CM

## 2018-05-18 LAB — CMP (CANCER CENTER ONLY)
ALK PHOS: 98 U/L (ref 38–126)
ALT: 18 U/L (ref 0–44)
AST: 20 U/L (ref 15–41)
Albumin: 3.9 g/dL (ref 3.5–5.0)
Anion gap: 10 (ref 5–15)
BILIRUBIN TOTAL: 0.3 mg/dL (ref 0.3–1.2)
BUN: 11 mg/dL (ref 6–20)
CHLORIDE: 105 mmol/L (ref 98–111)
CO2: 27 mmol/L (ref 22–32)
CREATININE: 0.72 mg/dL (ref 0.44–1.00)
Calcium: 9.6 mg/dL (ref 8.9–10.3)
GFR, Est AFR Am: >60 mL/min (ref >60)
Glucose, Bld: 81 mg/dL (ref 70–99)
Potassium: 3.5 mmol/L (ref 3.5–5.1)
SODIUM: 142 mmol/L (ref 135–145)
Total Protein: 7.6 g/dL (ref 6.5–8.1)

## 2018-05-18 LAB — CBC WITH DIFFERENTIAL (CANCER CENTER ONLY)
Abs Immature Granulocytes: 0 10*3/uL (ref 0.00–0.07)
Basophils Absolute: 0 10*3/uL (ref 0.0–0.1)
Basophils Relative: 1 %
EOS PCT: 2 %
Eosinophils Absolute: 0.1 10*3/uL (ref 0.0–0.5)
HEMATOCRIT: 38.1 % (ref 36.0–46.0)
HEMOGLOBIN: 12.9 g/dL (ref 12.0–15.0)
Immature Granulocytes: 0 %
LYMPHS PCT: 47 %
Lymphs Abs: 2.3 10*3/uL (ref 0.7–4.0)
MCH: 30.5 pg (ref 26.0–34.0)
MCHC: 33.9 g/dL (ref 30.0–36.0)
MCV: 90.1 fL (ref 80.0–100.0)
MONO ABS: 0.6 10*3/uL (ref 0.1–1.0)
Monocytes Relative: 12 %
Neutro Abs: 1.8 10*3/uL (ref 1.7–7.7)
Neutrophils Relative %: 38 %
Platelet Count: 245 10*3/uL (ref 150–400)
RBC: 4.23 MIL/uL (ref 3.87–5.11)
RDW: 12.7 % (ref 11.5–15.5)
WBC: 4.8 10*3/uL (ref 4.0–10.5)
nRBC: 0 % (ref 0.0–0.2)

## 2018-05-18 NOTE — Progress Notes (Signed)
[place]  [phone] [phone]) [phone]    ID: Brittney Vance OB: May 19, 1966  MR#: [id number]  MVV#:[id number]  PCP: Brittney Mclean, MD GYN:  Brittney Vance SU: Brittney Vance; Brittney Vance OTHER MD: Brittney Vance. Brittney Vance  CHIEF COMPLAINT:Left breast cancer, triple negative  CURRENT THERAPY:  observation  BREAST CANCER HISTORY:  from the original intake note:  Brittney Vance had routine screening mammography 03/09/2013 showing heterogeneously dense breasts, and a possible asymmetry in the left breast. Diagnostic left mammography and ultrasonography at the breast Center 03/30/2013 found a lobulated mass in the left breast upper outer quadrant measuring 1.6 cm. This was palpable. Ultrasound showed a complex microlobulated hypoechoic mass measuring 1.8 cm. There was no left axillary lymphadenopathy noted.  Biopsy of the mass in question 04/06/2013 showed ([id number]) and invasive ductal carcinoma, grade 3, triple negative, with an MIB-1 of 65%.  Bilateral breast MRI 04/13/2013 found an irregular mass measuring 2.5 cm in the left breast with a few lymph nodes that showed moderate cortical thickness, the largest measuring 12 mm (level I).  The patient's subsequent history is as detailed below  INTERVAL HISTORY: Brittney Vance returns today for follow-up of her estrogen receptor negative breast cancer. She continues under observation. She has saline implants in place.  REVIEW OF SYSTEMS: Brittney Vance reports being busy with family. Her mother passed away on 2018/03/15, her stroke, and surgery for prostate cancer.  He is doing well.  The patient denies unusual headaches, visual changes, nausea, vomiting, stiff neck, dizziness, or gait imbalance. There has been no cough, phlegm production, or pleurisy, no chest pain or pressure, and no change in bowel or bladder habits. The patient denies fever, rash, bleeding, unexplained fatigue or unexplained weight loss.  For exercise she walks  her dog twice a day, about 10 minutes at a time.  A detailed review of systems was otherwise entirely negative.   PAST MEDICAL HISTORY: Past Medical History:  Diagnosis Date  . Breast cancer ([place]) 2015   left, triple negative   . Depression 09/23/2014  . Fibroids   . Hypertension   . Hypothyroid     PAST SURGICAL HISTORY: Past Surgical History:  Procedure Laterality Date  . AXILLARY LYMPH NODE BIOPSY Left 10/30/2013   Procedure: AXILLARY LYMPH NODE BIOPSY;  Surgeon: Brittney Hollingshead, MD;  Location: [place];  Service: General;  Laterality: Left;  . BREAST BIOPSY Left 04/24/2013  . BREAST RECONSTRUCTION WITH PLACEMENT OF TISSUE EXPANDER AND FLEX HD (ACELLULAR HYDRATED DERMIS) Bilateral 10/30/2013   Procedure: BILATERAL BREAST RECONSTRUCTION WITH PLACEMENT OF TISSUE EXPANDER AND FLEX HD (ACELLULAR HYDRATED DERMIS);  Surgeon: Brittney Reese, MD;  Location: [place];  Service: Plastics;  Laterality: Bilateral;  . PORT-A-CATH REMOVAL Right 10/30/2013   Procedure: REMOVAL PORT-A-CATH;  Surgeon: Brittney Hollingshead, MD;  Location: [place];  Service: General;  Laterality: Right;  . PORTACATH PLACEMENT Right 04/27/2013   Procedure: ULTRASOUND GUIDED PORT INSERTION WITH FLUOROSCOPY;  Surgeon: Brittney Hollingshead, MD;  Location: [facility];  Service: General;  Laterality: Right;  . TOTAL MASTECTOMY Bilateral 10/30/2013   Procedure: TOTAL MASTECTOMY;  Surgeon: Brittney Hollingshead, MD;  Location: [place];  Service: General;  Laterality: Bilateral;  . TUBAL LIGATION      FAMILY HISTORY Family History  Problem Relation Age of Onset  . Hypertension Mother   . Diabetes Mother   . Alzheimer's disease Mother   . Stroke Mother   . Hypertension Father   . Diabetes Father   .  Hypertension Brother   . Epilepsy Son 4       being worked up for autism  . Thyroid disease Neg Hx   2 the patient's father died in his 54Y from complications of diabetes. The patient's mother passed away in 18-Mar-2018 from stroke. The patient had 2  brothers, and 2 sisters. There is no history of cancer in the family to her knowledge.  GYNECOLOGIC HISTORY:    (updated 10/03/2013)  Menarche age 60, first live birth age 62. The patient is GX P2. She is postmenopausal, but did have some perimenopausal bleeding, which was evaluated age 79 08/14/2012 with a transabdominal and transvaginal ultrasound of the pelvis which found a normal uterine myometrium and left ovary. The right ovary could not be visualized.  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 Her husband Brittney Vance also works for the [employer] system. He is currently being treated for prostate cancer. Their children are Brittney Vance, 12, and Brittney Vance, 9. Her mother passed away recently. The patient attends a [employer].    ADVANCED DIRECTIVES: Not in place   HEALTH MAINTENANCE: (Updated 10/03/2013) Social History   Tobacco Use  . Smoking status: Never Smoker  . Smokeless tobacco: Never Used  Substance Use Topics  . Alcohol use: Yes    Alcohol/week: 2.0 standard drinks    Types: 2 Glasses of wine per week    Comment: occassion  . Drug use: No     Colonoscopy:- June 2016  PAP: 02/24/2017  Bone density: Never  Lipid panel: 02/15/2018    Allergies  Allergen Reactions  . Chocolate Itching  . Latex Itching  . Shrimp [Shellfish Allergy] Itching  . Wheat Bran Itching    Current Outpatient Medications  Medication Sig Dispense Refill  . levothyroxine (SYNTHROID) 75 MCG tablet Take 1 tablet (75 mcg total) by mouth daily. 90 tablet 3   No current facility-administered medications for this visit.     OBJECTIVE:  Middle-aged African-American woman in no acute distress  Vitals:   05/18/18 1145  BP: 122/88  Pulse: 84  Resp: 18  Temp: 98.2 F (36.8 C)  SpO2: 100%  Body mass index is 27.07 kg/m.  ECOG: 1 Filed Weights   05/18/18 1145  Weight: 148 lb (67.1 kg)   Sclerae unicteric, EOMs  intact Oropharynx clear and moist No cervical or supraclavicular adenopathy Lungs no rales or rhonchi Heart regular rate and rhythm Abd soft, nontender, positive bowel sounds MSK no focal spinal tenderness, no upper extremity lymphedema Neuro: nonfocal, well oriented, appropriate affect Breasts: That is post bilateral mastectomies with bilateral saline implants in place.  There is no evidence of chest wall recurrence.  Both axillae are benign.   LAB RESULTS:  Lab Results  Component Value Date   WBC 4.8 05/18/2018   NEUTROABS 1.8 05/18/2018   HGB 12.9 05/18/2018   HCT 38.1 05/18/2018   MCV 90.1 05/18/2018   PLT 245 05/18/2018      Chemistry      Component Value Date/Time   NA 142 02/15/2018 1339   NA 140 05/19/2017 1035   K 4.8 02/15/2018 1339   K 3.5 05/19/2017 1035   CL 105 02/15/2018 1339   CO2 31 02/15/2018 1339   CO2 29 05/19/2017 1035   BUN 14 02/15/2018 1339   BUN 7.0 05/19/2017 1035   CREATININE 0.63 02/15/2018 1339   CREATININE 0.7 05/19/2017 1035      Component Value Date/Time  CALCIUM 10.1 02/15/2018 1339   CALCIUM 9.9 05/19/2017 1035   ALKPHOS 88 02/15/2018 1339   ALKPHOS 91 05/19/2017 1035   AST 16 02/15/2018 1339   AST 21 05/19/2017 1035   ALT 14 02/15/2018 1339   ALT 16 05/19/2017 1035   BILITOT 0.3 02/15/2018 1339   BILITOT 0.39 05/19/2017 1035       STUDIES: No results found.  ASSESSMENT: 52 y.o. BRCA negative [place] woman   (1)  status post left breast upper outer quadrant biopsy 04/06/2013 for a clinical T2 N1, stage IIB invasive ductal carcinoma, high-grade, triple negative, with an MIB-1 of 65%  (2) left axillary lymph node biopsy 04/24/2013 negative for malignancy  (3)  treated in the neoadjuvant setting:   (a) completed 4 dose dense cycles of doxorubicin/ cyclophosphamide 06/11/2013,.  (b)  completed 12 weekly cycles of carboplatin/ paclitaxel 09/24/2013  (4)  status post bilateral mastectomies 10/30/2013, showing benign  results on the right and a complete pathologic response in the left, including both sentinel lymph nodes clear  (5) status post bilateral breast implant reconstructive surgery  (6) pedunculated fibroid noted March 2016  (7) Right thyroid nodule noted July 2016  PLAN: Lennix is now 4-1/2 years out from definitive surgery with no evidence of disease recurrence.  This is very favorable.  I have suggested she intensify slightly her exercise program and try to get it to 45 minutes daily, 5 days a week if she can possibly do it.  I am going to see her again on the exact anniversary 5 years from her surgery and at that point she will be ready to "graduate".  She will have the option of continuing in survivorship year  She knows to call for any other issues that may develop before the next visit.  Brittney Vance, Virgie Dad, MD  05/18/18 12:04 PM Medical Oncology and Hematology [facility] [address] [place], [place] [address] Tel. [phone]    Fax. [phone]  This document serves as a record of services personally performed by Chauncey Cruel, MD. It was created on his behalf by Wilburn Mylar, a trained medical scribe. The creation of this record is based on the scribe's personal observations and the provider's statements to them.   I, Lurline Del MD, have reviewed the above documentation for accuracy and completeness, and I agree with the above.

## 2018-05-18 NOTE — Telephone Encounter (Signed)
Gave avs and calendar ° °

## 2018-06-26 ENCOUNTER — Ambulatory Visit (INDEPENDENT_AMBULATORY_CARE_PROVIDER_SITE_OTHER): Payer: BC Managed Care – PPO | Admitting: Obstetrics & Gynecology

## 2018-06-26 ENCOUNTER — Encounter: Payer: Self-pay | Admitting: *Deleted

## 2018-06-26 ENCOUNTER — Encounter: Payer: Self-pay | Admitting: Obstetrics & Gynecology

## 2018-06-26 VITALS — BP 139/88 | HR 108 | Resp 16 | Ht 63.0 in | Wt 151.0 lb

## 2018-06-26 DIAGNOSIS — Z23 Encounter for immunization: Secondary | ICD-10-CM

## 2018-06-26 DIAGNOSIS — Z01419 Encounter for gynecological examination (general) (routine) without abnormal findings: Secondary | ICD-10-CM

## 2018-06-26 DIAGNOSIS — D259 Leiomyoma of uterus, unspecified: Secondary | ICD-10-CM

## 2018-06-26 NOTE — Progress Notes (Signed)
Subjective:     Brittney Vance is a 52 y.o. female here for a routine exam.  Current complaints: a twinge of pain from fibroid.  Not worse than last year.  Wants to be sure it has not grown in size.     Gynecologic History Patient's last menstrual period was 03/10/2013. Contraception: post menopausal status Last Pap: 2018. Results were: normal Last mammogram: Pt told she does need them anymore.  She is s/p mastectomy bilateral  Obstetric History OB History  Gravida Para Term Preterm AB Living  2 2          SAB TAB Ectopic Multiple Live Births               # Outcome Date GA Lbr Len/2nd Weight Sex Delivery Anes PTL Lv  2 Para           1 Para              The following portions of the patient's history were reviewed and updated as appropriate: allergies, current medications, past family history, past medical history, past social history, past surgical history and problem list.  Review of Systems Pertinent items noted in HPI and remainder of comprehensive ROS otherwise negative.    Objective:      Vitals:   06/26/18 1404  BP: 139/88  Pulse: (!) 108  Resp: 16  Weight: 151 lb (68.5 kg)  Height: 5\' 3"  (1.6 m)   Vitals:  WNL General appearance: alert, cooperative and no distress  HEENT: Normocephalic, without obvious abnormality, atraumatic Eyes: negative Throat: lips, mucosa, and tongue normal; teeth and gums normal  Respiratory: Clear to auscultation bilaterally  CV: Regular rate and rhythm  Breasts:  Normal appearance, no masses or tenderness, s/p bilateral mastectomy with implants.    GI: Soft, non-tender; bowel sounds normal; no masses,  no organomegaly  GU: External Genitalia:  Tanner V, no lesion Urethra:  No prolapse   Vagina: Pink, normal rugae, no blood or discharge  Cervix: No CMT, no lesion  Uterus:  Enlarged non tender  Adnexa: Normal, no masses, non tender  Musculoskeletal: No edema, redness or tenderness in the calves or thighs  Skin: No lesions or rash   Lymphatic: Axillary adenopathy: none     Psychiatric: Normal mood and behavior        Assessment:    Healthy female exam.   Reports of irregular heart beat on her wearable   Plan:   Pap due 2021 Dr. Vinie Sill follows breast cancer.  nml physcial exam today [place] to evaluate fibroid and ensure stable size F./U with MD for sleep study and report are findings of irregular heart beat.   Flu vaccine

## 2018-06-29 ENCOUNTER — Encounter: Payer: Self-pay | Admitting: Family Medicine

## 2018-06-29 ENCOUNTER — Encounter: Payer: Self-pay | Admitting: Oncology

## 2018-06-29 ENCOUNTER — Ambulatory Visit: Payer: BC Managed Care – PPO | Admitting: Family Medicine

## 2018-06-29 VITALS — BP 140/90 | HR 95 | Temp 98.0°F | Ht 63.0 in | Wt 149.0 lb

## 2018-06-29 DIAGNOSIS — J069 Acute upper respiratory infection, unspecified: Secondary | ICD-10-CM | POA: Diagnosis not present

## 2018-06-29 DIAGNOSIS — I499 Cardiac arrhythmia, unspecified: Secondary | ICD-10-CM

## 2018-06-29 MED ORDER — BENZONATATE 200 MG PO CAPS: 200.0000 mg | ORAL_CAPSULE | Freq: Three times a day (TID) | ORAL | 0 refills | Status: DC | PRN

## 2018-06-29 NOTE — Progress Notes (Signed)
Patient: Brittney Vance MRN: [id number] DOB: [birth year] PCP: [name], MD     Subjective:  Chief Complaint  Patient presents with  . Cough    symptoms x 3-4 days  . Chills    HPI: The patient is a 52 y.o. female who presents today for cough x 4 days. She was seen Monday for her annual with her Gyn on Monday and she got her flu shot and she feels like she has gone down hill since that time. She has been taking mucinex and nyquil. She is having cough, mucous in her throat, chills over the past few days and some diarrhea. Her cough has a little production to it. She has no congestion and feels like it's all in her throat. She has no aches. She is just really tired and wants to lay down. Her husband was sick and she works at an [employer]. She is the librarian. She has no shortness of breath or wheezing. She is not a smoker and no lung pathology. She is having diarrhea about 3x/day that started Monday. Sometimes it is soft, but sometimes it's really runny. No blood in her stool and no abdominal pain. No N/V.   Review of Systems  Constitutional: Positive for chills and fatigue. Negative for fever.  HENT: Positive for sore throat. Negative for congestion, ear pain, postnasal drip, rhinorrhea, sinus pressure and sinus pain.        Sore throat on 12/4, feels fine today  Respiratory: Positive for cough. Negative for shortness of breath.   Cardiovascular: Negative for chest pain.  Gastrointestinal: Negative for abdominal pain and nausea.  Musculoskeletal: Negative for back pain, myalgias and neck pain.  Neurological: Negative for dizziness and headaches.    Allergies Patient has No Known Allergies.  Past Medical History Patient  has a past medical history of Breast cancer ([place]) (2015), Depression (09/23/2014), Fibroids, Hypertension, and Hypothyroid.  Surgical History Patient  has a past surgical history that includes Tubal ligation; Portacath placement (Right, 04/27/2013);  Breast biopsy (Left, 04/24/2013); Total mastectomy (Bilateral, 10/30/2013); Axillary lymph node biopsy (Left, 10/30/2013); Port-a-cath removal (Right, 10/30/2013); and Breast reconstruction with placement of tissue expander and flex hd (acellular hydrated dermis) (Bilateral, 10/30/2013).  Family History Pateint's family history includes Alzheimer's disease in her mother; Diabetes in her father and mother; Epilepsy (age of onset: 68) in her son; Hypertension in her brother, father, and mother; Stroke in her mother.  Social History Patient  reports that she has never smoked. She has never used smokeless tobacco. She reports that she drinks about 2.0 standard drinks of alcohol per week. She reports that she does not use drugs.    Objective: Vitals:   06/29/18 0946  BP: 140/90  Pulse: 95  Temp: 98 F (36.7 C)  TempSrc: Oral  SpO2: 95%  Weight: 149 lb (67.6 kg)  Height: 5\' 3"  (1.6 m)    Body mass index is 26.39 kg/m.  Physical Exam  Constitutional: She appears well-developed and well-nourished.  HENT:  Right Ear: External ear normal.  Left Ear: External ear normal.  Mouth/Throat: Oropharynx is clear and moist.  Tm pearly with light reflex bilaterally  Very mild TTP over maxillary sinuses   Neck: Normal range of motion. Neck supple.  Cardiovascular: Normal rate, regular rhythm and normal heart sounds.  Pulmonary/Chest: Effort normal and breath sounds normal. She has no wheezes. She has no rales.  Abdominal: Soft. Bowel sounds are normal.  Lymphadenopathy:    She has no cervical adenopathy.  Skin: Skin is warm and dry. Capillary refill takes less than 2 seconds.  Vitals reviewed.      Assessment/plan: 1. Viral URI Conservative therapy at this time. No indication of antibiotic infection. Want her to drink fluids, get cool mist humidifier to use at night, honey for cough, robitussin DM during day and nyquil at night. Will send in tessalon pearls for her to use prn for her cough. She may  have had a little reaction to flu shot as well. Discussed if fever/worsening symptoms she is to let us know.     Return if symptoms worsen or fail to improve.   Orma Flaming, MD [place]   06/29/2018

## 2018-06-29 NOTE — Patient Instructions (Signed)
Cool mist humidifier at night Honey for cough (1 tablespoon/day) Robitussin DM for cough  nyquil at night Ibuprofen for pain Tessalon pearls sent in for cough as needed.   Viral Respiratory Infection A viral respiratory infection is an illness that affects parts of the body used for breathing, like the lungs, nose, and throat. It is caused by a germ called a virus. Some examples of this kind of infection are:  A cold.  The flu (influenza).  A respiratory syncytial virus (RSV) infection.  How do I know if I have this infection? Most of the time this infection causes:  A stuffy or runny nose.  Yellow or green fluid in the nose.  A cough.  Sneezing.  Tiredness (fatigue).  Achy muscles.  A sore throat.  Sweating or chills.  A fever.  A headache.  How is this infection treated? If the flu is diagnosed early, it may be treated with an antiviral medicine. This medicine shortens the length of time a person has symptoms. Symptoms may be treated with over-the-counter and prescription medicines, such as:  Expectorants. These make it easier to cough up mucus.  Decongestant nasal sprays.  Doctors do not prescribe antibiotic medicines for viral infections. They do not work with this kind of infection. How do I know if I should stay home? To keep others from getting sick, stay home if you have:  A fever.  A lasting cough.  A sore throat.  A runny nose.  Sneezing.  Muscles aches.  Headaches.  Tiredness.  Weakness.  Chills.  Sweating.  An upset stomach (nausea).  Follow these instructions at home:  Rest as much as possible.  Take over-the-counter and prescription medicines only as told by your doctor.  Drink enough fluid to keep your pee (urine) clear or pale yellow.  Gargle with salt water. Do this 3-4 times per day or as needed. To make a salt-water mixture, dissolve -1 tsp of salt in 1 cup of warm water. Make sure the salt dissolves all the  way.  Use nose drops made from salt water. This helps with stuffiness (congestion). It also helps soften the skin around your nose.  Do not drink alcohol.  Do not use tobacco products, including cigarettes, chewing tobacco, and e-cigarettes. If you need help quitting, ask your doctor. Get help if:  Your symptoms last for 10 days or longer.  Your symptoms get worse over time.  You have a fever.  You have very bad pain in your face or forehead.  Parts of your jaw or neck become very swollen. Get help right away if:  You feel pain or pressure in your chest.  You have shortness of breath.  You faint or feel like you will faint.  You keep throwing up (vomiting).  You feel confused. This information is not intended to replace advice given to you by your health care provider. Make sure you discuss any questions you have with your health care provider. Document Released: 06/24/2008 Document Revised: 12/18/2015 Document Reviewed: 12/18/2014 Elsevier Interactive Patient Education  2018 [employer].

## 2018-06-30 ENCOUNTER — Encounter: Payer: Self-pay | Admitting: Pulmonary Disease

## 2018-06-30 ENCOUNTER — Ambulatory Visit
Admission: RE | Admit: 2018-06-30 | Discharge: 2018-06-30 | Disposition: A | Discharge status: Home / Self Care | Payer: BC Managed Care – PPO | Source: Ambulatory Visit | Attending: Obstetrics & Gynecology | Admitting: Obstetrics & Gynecology

## 2018-06-30 ENCOUNTER — Ambulatory Visit (INDEPENDENT_AMBULATORY_CARE_PROVIDER_SITE_OTHER): Payer: BC Managed Care – PPO | Admitting: Pulmonary Disease

## 2018-06-30 VITALS — BP 126/86 | HR 94 | Ht 61.0 in | Wt 149.0 lb

## 2018-06-30 DIAGNOSIS — R0683 Snoring: Secondary | ICD-10-CM

## 2018-06-30 DIAGNOSIS — R002 Palpitations: Secondary | ICD-10-CM | POA: Diagnosis not present

## 2018-06-30 DIAGNOSIS — D259 Leiomyoma of uterus, unspecified: Secondary | ICD-10-CM

## 2018-06-30 NOTE — Progress Notes (Signed)
[facility]  Chief Complaint  Patient presents with  . sleep consult    Pt referred by Dr. Silvestre Mesi MD for sleep concerns. Pt sleeps around 6 hrs each night, some daytime sleepiness.    Constitutional:  BP 126/86 (BP Location: Left Arm, Cuff Size: Normal)   Pulse 94   Ht 5\' 1"  (1.549 m)   Wt 149 lb (67.6 kg)   LMP 03/10/2013 (LMP Unknown)   SpO2 98%   BMI 28.15 kg/m   Past Medical History:  Breast cancer, Depression, Fibroids, HTN, Hypothyroidism  Brief Summary:  Brittney Vance is a 52 y.o. female with snoring  Her husband has been concerned about her snoring.  She also stops breathing while asleep.  She has trouble sleeping on her back and doesn't dream much.  She wakes up feeling like her breathing is shallow and her blood runs high in the morning.  She goes to sleep at 10 pm.  She falls asleep 30 minutes.  She wakes up some times to use the bathroom.  She gets out of bed at 530 am.  She feels okay in the morning.  She denies morning headache.  She does not use anything to help her stay awake.  She drinks herbal tea before going to bed.  She denies sleep walking, sleep talking, bruxism, or nightmares.  There is no history of restless legs.  She denies sleep hallucinations, sleep paralysis, or cataplexy.  The Epworth score is 10 out of 24.  She has a blood pressure machine.  This has intermittently reported an irregular heart rhythm.  She does not have chest pain, shortness of breath, or palpitations.  She is not aware of having an irregular heart rhythm  Physical Exam:   Appearance - well kempt   ENMT - clear nasal mucosa, midline nasal  septum, no oral exudates, no LAN, trachea midline, MP 4, enlarged tongue  Respiratory - normal chest wall, normal respiratory effort, no accessory muscle use, no wheeze/rales  CV - s1s2 regular rate and rhythm, no murmurs, no peripheral edema, radial pulses symmetric  GI - soft, non tender,  no masses  Lymph - no adenopathy noted in neck and axillary areas  MSK - normal gait  Ext - no cyanosis, clubbing, or joint inflammation noted  Skin - no rashes, lesions, or ulcers  Neuro - normal strength, oriented x 3  Psych - normal mood and affect   ECG today showed normal sinus rhythm.   Discussion:  She has snoring, sleep disruption, apnea, and daytime sleepiness.  She has elevated blood pressure.  I am concerned she could have sleep apnea.  Assessment/Plan:   Snoring with excessive daytime sleepiness. - will need to arrange for a home sleep study  Obesity. - discussed how weight can impact sleep and risk for sleep disordered breathing - discussed options to assist with weight loss: combination of diet modification, cardiovascular and strength training exercises  Cardiovascular risk. - had an extensive discussion regarding the adverse health consequences related to untreated sleep disordered breathing - specifically discussed the risks for hypertension, coronary artery disease, cardiac dysrhythmias, cerebrovascular disease, and diabetes - lifestyle modification discussed  Safe driving practices. - discussed how sleep disruption can increase risk of accidents, particularly when driving - safe driving practices were discussed  Therapies for obstructive sleep apnea. - if the sleep study shows significant sleep apnea, then various therapies for treatment were reviewed: CPAP, oral appliance, and surgical interventions  Irregular heart  rhythm reported on home blood pressure device. - ECG showed normal sinus rhythm today - she has appointment with cardiology scheduled   Patient Instructions  Will arrange for home sleep study Will call to arrange for follow up after sleep study reviewed    Chesley Mires, MD [place] Pager: [phone] 06/30/2018, 4:06 PM  Flow Sheet    Sleep tests:    Cardiac tests:  Echo 04/30/13 >> mild LVH, EF 55  to 60%, grade 1 DD  Review of Systems:  Constitutional: Negative for fever and unexpectedweight change.  HENT: Positive for congestion. Negative for dental problem, ear pain, nosebleeds, postnasal drip, rhinorrhea, sinus pressure, sneezing, sore throat and trouble swallowing.   Eyes: Negative for redness and itching.  Respiratory: Positive for cough. Negative for chest tightness, shortness of breath and wheezing.   Cardiovascular: Negative for palpitations and leg swelling.  Gastrointestinal: Negative for nausea and vomiting.  Genitourinary: Negative for dysuria.  Musculoskeletal: Negative for joint swelling.  Skin: Negative for rash.  Allergic/Immunologic: Negative.  Negative for environmental allergies, food allergies and immunocompromised state.  Neurological: Positive for headaches.  Hematological: Does not bruise/bleed easily.  Psychiatric/Behavioral: Negative for dysphoric mood. The patient is not nervous/anxious.    Medications:   Allergies as of 06/30/2018   No Known Allergies     Medication List        Accurate as of 06/30/18  4:06 PM. Always use your most recent med list.          benzonatate 200 MG capsule Commonly known as:  TESSALON Take 1 capsule (200 mg total) by mouth 3 (three) times daily as needed for cough.   levothyroxine 75 MCG tablet Commonly known as:  SYNTHROID, LEVOTHROID Take 1 tablet (75 mcg total) by mouth daily.       Past Surgical History:  She  has a past surgical history that includes Tubal ligation; Portacath placement (Right, 04/27/2013); Breast biopsy (Left, 04/24/2013); Total mastectomy (Bilateral, 10/30/2013); Axillary lymph node biopsy (Left, 10/30/2013); Port-a-cath removal (Right, 10/30/2013); and Breast reconstruction with placement of tissue expander and flex hd (acellular hydrated dermis) (Bilateral, 10/30/2013).  Family History:  Her family history includes Alzheimer's disease in her mother; Diabetes in her father and mother; Epilepsy (age  of onset: 62) in her son; Hypertension in her brother, father, and mother; Stroke in her mother.  Social History:  She  reports that she has never smoked. She has never used smokeless tobacco. She reports that she drinks about 2.0 standard drinks of alcohol per week. She reports that she does not use drugs.

## 2018-06-30 NOTE — Progress Notes (Signed)
   Subjective:    Patient ID: Brittney Vance, female    DOB: [birth year], 52 y.o.   MRN: [id number]  HPI    Review of Systems  Constitutional: Negative for fever and unexpected weight change.  HENT: Positive for congestion. Negative for dental problem, ear pain, nosebleeds, postnasal drip, rhinorrhea, sinus pressure, sneezing, sore throat and trouble swallowing.   Eyes: Negative for redness and itching.  Respiratory: Positive for cough. Negative for chest tightness, shortness of breath and wheezing.   Cardiovascular: Negative for palpitations and leg swelling.  Gastrointestinal: Negative for nausea and vomiting.  Genitourinary: Negative for dysuria.  Musculoskeletal: Negative for joint swelling.  Skin: Negative for rash.  Allergic/Immunologic: Negative.  Negative for environmental allergies, food allergies and immunocompromised state.  Neurological: Positive for headaches.  Hematological: Does not bruise/bleed easily.  Psychiatric/Behavioral: Negative for dysphoric mood. The patient is not nervous/anxious.        Objective:   Physical Exam        Assessment & Plan:

## 2018-06-30 NOTE — Telephone Encounter (Signed)
I spoke with Dr. Halford Chessman and he is happy to help, which I much appreciate

## 2018-06-30 NOTE — Telephone Encounter (Signed)
Called pt - she has seen 2 doctors this week and apparently was not in a fib or any other significant arrythmia. However no EKG done. She is actually on her way to see pulmonology right now, Dr. Halford Chessman. Called and left message for Dr. Juanetta Gosling nurse to call me, ?can he get an EKG Will also send him a message

## 2018-06-30 NOTE — Patient Instructions (Signed)
Will arrange for home sleep study Will call to arrange for follow up after sleep study reviewed  

## 2018-07-01 ENCOUNTER — Other Ambulatory Visit: Payer: Self-pay | Admitting: Family Medicine

## 2018-07-01 ENCOUNTER — Encounter: Payer: Self-pay | Admitting: Family Medicine

## 2018-07-01 DIAGNOSIS — R002 Palpitations: Secondary | ICD-10-CM

## 2018-07-04 ENCOUNTER — Telehealth: Payer: Self-pay | Admitting: *Deleted

## 2018-07-04 NOTE — Telephone Encounter (Signed)
-----   Message from Guss Bunde, MD sent at 06/30/2018  3:15 PM EST ----- Firbroid tumor has decreased in size since 2016 [place].  Findings consistent with benign fibroid.

## 2018-07-04 NOTE — Telephone Encounter (Signed)
Pt notified of TV[facility] results of decreasing fibroid.

## 2018-07-05 ENCOUNTER — Encounter: Payer: Self-pay | Admitting: Family Medicine

## 2018-07-05 DIAGNOSIS — R0683 Snoring: Secondary | ICD-10-CM

## 2018-07-06 ENCOUNTER — Ambulatory Visit: Payer: BC Managed Care – PPO | Admitting: Family Medicine

## 2018-07-06 NOTE — Telephone Encounter (Signed)
Received mychart message from pt: I have not received the follow up call regarding the insurance approval for home sleep apnea test yet.I can be reached at work [phone] between the hours of 7:30 am and 3:00pm  [facility], can you help [place] out with this please?

## 2018-07-06 NOTE — Telephone Encounter (Signed)
Message from [facility]: This pt's insurance will req her to do an in lab study we will have to set that up and we need an order for it thanks libby  Order has been placed for the split night study. VS is out of the office until 07/10/18 so if the order cannot be signed by him before then, he will sign it when he returns back to the office. mychart message has been sent back to pt's previous message to [place].  Nothing further needed.

## 2018-07-09 ENCOUNTER — Emergency Department (HOSPITAL_BASED_OUTPATIENT_CLINIC_OR_DEPARTMENT_OTHER): Payer: BC Managed Care – PPO

## 2018-07-09 ENCOUNTER — Emergency Department (HOSPITAL_BASED_OUTPATIENT_CLINIC_OR_DEPARTMENT_OTHER)
Admission: EM | Admit: 2018-07-09 | Discharge: 2018-07-10 | Disposition: A | Discharge status: Home / Self Care | Payer: BC Managed Care – PPO | Attending: Emergency Medicine | Admitting: Emergency Medicine

## 2018-07-09 ENCOUNTER — Other Ambulatory Visit: Payer: Self-pay

## 2018-07-09 ENCOUNTER — Encounter (HOSPITAL_BASED_OUTPATIENT_CLINIC_OR_DEPARTMENT_OTHER): Payer: Self-pay | Admitting: *Deleted

## 2018-07-09 DIAGNOSIS — Z79899 Other long term (current) drug therapy: Secondary | ICD-10-CM | POA: Insufficient documentation

## 2018-07-09 DIAGNOSIS — Z853 Personal history of malignant neoplasm of breast: Secondary | ICD-10-CM | POA: Insufficient documentation

## 2018-07-09 DIAGNOSIS — R0789 Other chest pain: Secondary | ICD-10-CM | POA: Diagnosis not present

## 2018-07-09 DIAGNOSIS — R079 Chest pain, unspecified: Secondary | ICD-10-CM | POA: Diagnosis present

## 2018-07-09 DIAGNOSIS — I10 Essential (primary) hypertension: Secondary | ICD-10-CM | POA: Insufficient documentation

## 2018-07-09 DIAGNOSIS — E039 Hypothyroidism, unspecified: Secondary | ICD-10-CM | POA: Diagnosis not present

## 2018-07-09 LAB — PREGNANCY, URINE: Preg Test, Ur: NEGATIVE

## 2018-07-09 NOTE — ED Triage Notes (Signed)
Pt reports chest approx 1 hour pta, "like indigestion". C/o pain in neck and left arm. Also dizziness

## 2018-07-10 LAB — TROPONIN I
Troponin I: <0.03 ng/mL (ref <0.03)
Troponin I: <0.03 ng/mL (ref <0.03)

## 2018-07-10 LAB — BASIC METABOLIC PANEL
Anion gap: 6 (ref 5–15)
BUN: 16 mg/dL (ref 6–20)
CO2: 28 mmol/L (ref 22–32)
Calcium: 9.5 mg/dL (ref 8.9–10.3)
Chloride: 103 mmol/L (ref 98–111)
Creatinine, Ser: 0.69 mg/dL (ref 0.44–1.00)
GFR calc Af Amer: >60 mL/min (ref >60)
GFR calc non Af Amer: >60 mL/min (ref >60)
Glucose, Bld: 108 mg/dL — ABNORMAL HIGH (ref 70–99)
Potassium: 3.7 mmol/L (ref 3.5–5.1)
Sodium: 137 mmol/L (ref 135–145)

## 2018-07-10 LAB — CBC
HCT: 37.1 % (ref 36.0–46.0)
Hemoglobin: 11.9 g/dL — ABNORMAL LOW (ref 12.0–15.0)
MCH: 29.3 pg (ref 26.0–34.0)
MCHC: 32.1 g/dL (ref 30.0–36.0)
MCV: 91.4 fL (ref 80.0–100.0)
Platelets: 302 10*3/uL (ref 150–400)
RBC: 4.06 MIL/uL (ref 3.87–5.11)
RDW: 12.9 % (ref 11.5–15.5)
WBC: 7 10*3/uL (ref 4.0–10.5)
nRBC: 0 % (ref 0.0–0.2)

## 2018-07-10 NOTE — ED Provider Notes (Signed)
[facility] EMERGENCY DEPARTMENT Provider Note   CSN: [id number] Arrival date & time: 07/09/18  2314     History   Chief Complaint Chief Complaint  Patient presents with  . Chest Pain    HPI Brittney Vance is a 52 y.o. female.  HPI  This is a 52 year old female with a history of breast cancer and hypertension who presents with chest pain.  Patient reports she had onset of burning chest pain approximately 10:30 PM.  She states "it felt like indigestion."  She reports that radiated into her left arm and her jaw.  It lasted for minutes and self resolved.  It was not associated with eating or exertion.  She denies any fevers, cough, shortness of breath, nausea, sweating.  She is never had pain like that before.  Currently she is pain-free.  She has not had any recurrence of her symptoms.  She reports that she is being worked up by her primary physician for palpitations and is due to have a Holter monitor on Thursday.  She is also due to see cardiology on Wednesday.  Past Medical History:  Diagnosis Date  . Breast cancer ([place]) 2015   left, triple negative   . Depression 09/23/2014  . Fibroids   . Hypertension   . Hypothyroid     Patient Active Problem List   Diagnosis Date Noted  . Goiter 02/06/2015  . Fibroid, uterine 10/03/2014  . Pain in joint, pelvic region and thigh 09/23/2014  . Depression 09/23/2014  . Genital herpes 03/15/2014  . Hypokalemia 10/03/2013  . Anemia associated with chemotherapy 09/17/2013  . Neutrophils decreased ([place]) 08/13/2013  . Malignant neoplasm of upper-outer quadrant of left breast in female, estrogen receptor negative ([place]) 04/26/2013  . Hypothyroidism 01/28/2013  . Hypertension 01/25/2013  . Perimenopause 01/25/2013    Past Surgical History:  Procedure Laterality Date  . AXILLARY LYMPH NODE BIOPSY Left 10/30/2013   Procedure: AXILLARY LYMPH NODE BIOPSY;  Surgeon: [name], MD;  Location: [facility];  Service: General;   Laterality: Left;  . BREAST BIOPSY Left 04/24/2013  . BREAST RECONSTRUCTION WITH PLACEMENT OF TISSUE EXPANDER AND FLEX HD (ACELLULAR HYDRATED DERMIS) Bilateral 10/30/2013   Procedure: BILATERAL BREAST RECONSTRUCTION WITH PLACEMENT OF TISSUE EXPANDER AND FLEX HD (ACELLULAR HYDRATED DERMIS);  Surgeon: [name], MD;  Location: [facility];  Service: Plastics;  Laterality: Bilateral;  . MASTECTOMY    . PORT-A-CATH REMOVAL Right 10/30/2013   Procedure: REMOVAL PORT-A-CATH;  Surgeon: [name], MD;  Location: [facility];  Service: General;  Laterality: Right;  . PORTACATH PLACEMENT Right 04/27/2013   Procedure: ULTRASOUND GUIDED PORT INSERTION WITH FLUOROSCOPY;  Surgeon: [name], MD;  Location: [facility];  Service: General;  Laterality: Right;  . TOTAL MASTECTOMY Bilateral 10/30/2013   Procedure: TOTAL MASTECTOMY;  Surgeon: [name], MD;  Location: [facility];  Service: General;  Laterality: Bilateral;  . TUBAL LIGATION       OB History    Gravida  2   Para  2   Term      Preterm      AB      Living        SAB      TAB      Ectopic      Multiple      Live Births               Home Medications    Prior to Admission medications   Medication  Sig Start Date End Date Taking? Authorizing Provider  levothyroxine (SYNTHROID) 75 MCG tablet Take 1 tablet (75 mcg total) by mouth daily. 02/17/18  Yes [name], MD  benzonatate (TESSALON) 200 MG capsule Take 1 capsule (200 mg total) by mouth 3 (three) times daily as needed for cough. 06/29/18   Orma Flaming, MD    Family History Family History  Problem Relation Age of Onset  . Hypertension Mother   . Diabetes Mother   . Alzheimer's disease Mother   . Stroke Mother   . Hypertension Father   . Diabetes Father   . Hypertension Brother   . Epilepsy Son 4       being worked up for autism  . Thyroid disease Neg Hx     Social History Social History   Tobacco Use  . Smoking status: Never Smoker  . Smokeless  tobacco: Never Used  Substance Use Topics  . Alcohol use: Yes    Alcohol/week: 2.0 standard drinks    Types: 2 Glasses of wine per week    Comment: occassion  . Drug use: No     Allergies   Patient has no known allergies.   Review of Systems Review of Systems  Constitutional: Negative for fever.  Respiratory: Negative for cough, chest tightness and shortness of breath.   Cardiovascular: Positive for chest pain. Negative for leg swelling.  Gastrointestinal: Negative for abdominal pain, nausea and vomiting.  Genitourinary: Negative for dysuria.  Musculoskeletal: Negative for back pain.  Neurological: Negative for headaches.  All other systems reviewed and are negative.    Physical Exam Updated Vital Signs BP (!) 142/88   Pulse 79   Temp 98.3 F (36.8 C) (Oral)   Resp 16   LMP 03/10/2013 (LMP Unknown)   SpO2 97%   Physical Exam Vitals signs and nursing note reviewed.  Constitutional:      Appearance: She is well-developed. She is not ill-appearing or toxic-appearing.  HENT:     Head: Normocephalic and atraumatic.  Eyes:     Pupils: Pupils are equal, round, and reactive to light.  Neck:     Musculoskeletal: Neck supple.  Cardiovascular:     Rate and Rhythm: Normal rate and regular rhythm.     Pulses: Normal pulses.     Heart sounds: Normal heart sounds.  Pulmonary:     Effort: Pulmonary effort is normal. No respiratory distress.     Breath sounds: No wheezing.  Chest:     Comments: Scarring right upper chest from prior port site, bilateral augmentation Abdominal:     General: Bowel sounds are normal.     Palpations: Abdomen is soft.  Musculoskeletal:     Right lower leg: No edema.     Left lower leg: No edema.  Skin:    General: Skin is warm and dry.  Neurological:     Mental Status: She is alert and oriented to person, place, and time.      ED Treatments / Results  Labs (all labs ordered are listed, but only abnormal results are displayed) Labs  Reviewed  BASIC METABOLIC PANEL - Abnormal; Notable for the following components:      Result Value   Glucose, Bld 108 (*)    All other components within normal limits  CBC - Abnormal; Notable for the following components:   Hemoglobin 11.9 (*)    All other components within normal limits  TROPONIN I  PREGNANCY, URINE  TROPONIN I    EKG EKG Interpretation  Date/Time:  Sunday July 09 2018 23:21:43 EST Ventricular Rate:  89 PR Interval:  140 QRS Duration: 78 QT Interval:  358 QTC Calculation: 435 R Axis:   83 Text Interpretation:  Normal sinus rhythm Normal ECG Confirmed by Thayer Jew [phone]) on 07/10/2018 3:58:11 AM   Radiology Dg Chest 2 View  Result Date: 07/09/2018 CLINICAL DATA:  Chest pain EXAM: CHEST - 2 VIEW COMPARISON:  04/27/2013 FINDINGS: Heart and mediastinal contours are within normal limits. No focal opacities or effusions. No acute bony abnormality. Thoracic scoliosis. IMPRESSION: No active cardiopulmonary disease. Electronically Signed   By: Rolm Baptise M.D.   On: 07/09/2018 23:36    Procedures Procedures (including critical care time)  Medications Ordered in ED Medications - No data to display   Initial Impression / Assessment and Plan / ED Course  I have reviewed the triage vital signs and the nursing notes.  Pertinent labs & imaging results that were available during my care of the patient were reviewed by me and considered in my medical decision making (see chart for details).     Patient presents with chest pain.  She is overall nontoxic-appearing vital signs are reassuring.  Chest pain somewhat atypical.  It was self-limited in nature and nonexertional.  She is currently pain-free.  Considerations include ACS, reflux.  She is low risk for PE.  Work-up initiated.  EKG without evidence of ischemia or arrhythmia.  Chest x-ray without pneumonia or pneumothorax.  Initial troponin is negative.  Patient continues to be chest pain-free while in  the emergency department.  O2 sats 100% on room air and no respiratory distress.  Repeat troponin remains negative.  She has close cardiology follow-up already scheduled this week.  Patient reassured.  We will have her follow-up with cardiology.  Heart score is 2 for age and risk factors.  After history, exam, and medical workup I feel the patient has been appropriately medically screened and is safe for discharge home. Pertinent diagnoses were discussed with the patient. Patient was given return precautions.   Final Clinical Impressions(s) / ED Diagnoses   Final diagnoses:  Atypical chest pain    ED Discharge Orders    None       Brei Pociask, Barbette Hair, MD 07/10/18 [phone]

## 2018-07-10 NOTE — ED Notes (Signed)
ED Provider at bedside. 

## 2018-07-10 NOTE — Discharge Instructions (Addendum)
You were seen today for chest pain.  Your work-up is reassuring.  Follow-up with your primary physician.  You likely need cardiology follow-up for stress testing as well.

## 2018-07-12 ENCOUNTER — Ambulatory Visit: Payer: BC Managed Care – PPO | Admitting: Internal Medicine

## 2018-07-12 ENCOUNTER — Encounter: Payer: Self-pay | Admitting: Internal Medicine

## 2018-07-12 VITALS — BP 127/87 | HR 98 | Ht 62.0 in | Wt 148.4 lb

## 2018-07-12 DIAGNOSIS — E038 Other specified hypothyroidism: Secondary | ICD-10-CM | POA: Diagnosis not present

## 2018-07-12 DIAGNOSIS — R9431 Abnormal electrocardiogram [ECG] [EKG]: Secondary | ICD-10-CM

## 2018-07-12 DIAGNOSIS — I499 Cardiac arrhythmia, unspecified: Secondary | ICD-10-CM | POA: Diagnosis not present

## 2018-07-12 DIAGNOSIS — R0789 Other chest pain: Secondary | ICD-10-CM

## 2018-07-12 DIAGNOSIS — E7800 Pure hypercholesterolemia, unspecified: Secondary | ICD-10-CM | POA: Diagnosis not present

## 2018-07-12 NOTE — Patient Instructions (Signed)
Medication Instructions:  No changes If you need a refill on your cardiac medications before your next appointment, please call your pharmacy.   Lab work: FASTING lab work to check cholesterol & TSH If you have labs (blood work) drawn today and your tests are completely normal, you will receive your results only by: Marland Kitchen MyChart Message (if you have MyChart) OR . A paper copy in the mail If you have any lab test that is abnormal or we need to change your treatment, we will call you to review the results.  Testing/Procedures: Dr. Debara Pickett has recommended a 14 day ZIO monitor.   Dr. Debara Pickett has recommended an exercise stress test.   Follow-Up: At [facility], you and your health needs are our priority.  As part of our continuing mission to provide you with exceptional heart care, we have created designated Provider Care Teams.  These Care Teams include your primary Cardiologist (physician) and Advanced Practice Providers (APPs -  Physician Assistants and Nurse Practitioners) who all work together to provide you with the care you need, when you need it. You will need a follow up appointment in 4-6 weeks after monitor & stress test. You may see Dr. Debara Pickett or one of the following Advanced Practice Providers on your designated Care Team: Almyra Deforest, [place] . Fabian Sharp, PA-C  Any Other Special Instructions Will Be Listed Below (If Applicable).

## 2018-07-12 NOTE — Progress Notes (Signed)
OFFICE CONSULT NOTE  Chief Complaint:  Chest pain, irregular heart rate  Primary Care Physician: Brittney Mclean, MD  HPI:  Brittney Vance is a 52 y.o. female who is being seen today for the evaluation of chest pain, irregular heart rate at the request of Brittney Vance, Brittney Filler, MD.  This is a pleasant 52 year old female was kindly referred for evaluation of chest pain and irregular heartbeat.  Recently she had an episode of burning type chest discomfort.  It came on at rest and went away spontaneously.  She is not had episodes like this before.  She went to [facility] and had work-up including EKG and troponins which were negative.  She was recommended to follow-up with cardiology.  Subsequently she is being worked up for possible obstructive sleep apnea.  She is monitoring home blood pressures and her cuff noted an irregular heart rate.  Based on this her pulmonologist/sleep doctor ordered an EKG which did not show any A. fib or irregularity.  She was then referred by Brittney Vance for further evaluation.  Currently she denies any chest pain, palpitations or worsening shortness of breath.  The chest discomfort that took her to med Center included left arm pain again at rest.  It was described as a pressure or burning sensation and more like 4 5 out of 10 intensity.  PMHx:  Past Medical History:  Diagnosis Date  . Breast cancer ([place]) 2015   left, triple negative   . Depression 09/23/2014  . Fibroids   . Hypertension   . Hypothyroid     Past Surgical History:  Procedure Laterality Date  . AXILLARY LYMPH NODE BIOPSY Left 10/30/2013   Procedure: AXILLARY LYMPH NODE BIOPSY;  Surgeon: [name], MD;  Location: [place];  Service: General;  Laterality: Left;  . BREAST BIOPSY Left 04/24/2013  . BREAST RECONSTRUCTION WITH PLACEMENT OF TISSUE EXPANDER AND FLEX HD (ACELLULAR HYDRATED DERMIS) Bilateral 10/30/2013   Procedure: BILATERAL BREAST RECONSTRUCTION WITH PLACEMENT OF TISSUE EXPANDER AND FLEX  HD (ACELLULAR HYDRATED DERMIS);  Surgeon: [name], MD;  Location: [place];  Service: Plastics;  Laterality: Bilateral;  . MASTECTOMY    . PORT-A-CATH REMOVAL Right 10/30/2013   Procedure: REMOVAL PORT-A-CATH;  Surgeon: [name], MD;  Location: [place];  Service: General;  Laterality: Right;  . PORTACATH PLACEMENT Right 04/27/2013   Procedure: ULTRASOUND GUIDED PORT INSERTION WITH FLUOROSCOPY;  Surgeon: [name], MD;  [facility];  Service: General;  Laterality: Right;  . TOTAL MASTECTOMY Bilateral 10/30/2013   Procedure: TOTAL MASTECTOMY;  Surgeon: [name], MD;  Location: [place];  Service: General;  Laterality: Bilateral;  . TUBAL LIGATION      FAMHx:  Family History  Problem Relation Age of Onset  . Hypertension Mother   . Diabetes Mother   . Alzheimer's disease Mother   . Stroke Mother   . Hypertension Father   . Diabetes Father   . Hypertension Brother   . Epilepsy Son 4       being worked up for autism  . Thyroid disease Neg Hx     SOCHx:   reports that she has never smoked. She has never used smokeless tobacco. She reports current alcohol use of about 2.0 standard drinks of alcohol per week. She reports that she does not use drugs.  ALLERGIES:  No Known Allergies  ROS: Pertinent items noted in HPI and remainder of comprehensive ROS otherwise negative.  HOME MEDS: Current Outpatient Medications  on File Prior to Visit  Medication Sig Dispense Refill  . levothyroxine (SYNTHROID) 75 MCG tablet Take 1 tablet (75 mcg total) by mouth daily. 90 tablet 3   No current facility-administered medications on file prior to visit.     LABS/IMAGING: No results found for this or any previous visit (from the past 48 hour(s)). No results found.  LIPID PANEL:    Component Value Date/Time   CHOL 247 (H) 02/15/2018 1339   TRIG 84.0 02/15/2018 1339   HDL 68.70 02/15/2018 1339   CHOLHDL 4 02/15/2018 1339   VLDL 16.8 02/15/2018 1339   LDLCALC 161 (H)  02/15/2018 1339    WEIGHTS: Wt Readings from Last 3 Encounters:  07/12/18 148 lb 6.4 oz (67.3 kg)  06/30/18 149 lb (67.6 kg)  06/29/18 149 lb (67.6 kg)    VITALS: BP 127/87   Pulse 98   Ht 5\' 2"  (1.575 m)   Wt 148 lb 6.4 oz (67.3 kg)   LMP 03/10/2013 (LMP Unknown)   BMI 27.14 kg/m   EXAM: General appearance: alert and no distress Neck: no carotid bruit, no JVD and thyroid not enlarged, symmetric, no tenderness/mass/nodules Lungs: clear to auscultation bilaterally Heart: regular rate and rhythm, S1, S2 normal, no murmur, click, rub or gallop Abdomen: soft, non-tender; bowel sounds normal; no masses,  no organomegaly Extremities: extremities normal, atraumatic, no cyanosis or edema Pulses: 2+ and symmetric Skin: Skin color, texture, turgor normal. No rashes or lesions Neurologic: Grossly normal Psych: Pleasant  EKG: Normal sinus rhythm 98, nonspecific T wave changes- personally reviewed  ASSESSMENT: 1. Chest and left arm pain 2. Irregular heartbeat 3. Possible obstructive sleep apnea  PLAN: 1.   [name] is noted to have some irregular heartbeat on her blood pressure cuff.  She is also had an episode of chest and left arm pain.  She ruled out for ACS by troponins.  I like for her to undergo exercise treadmill stress testing.  With her irregular heartbeat she was ready ordered for 24-hour monitor to start tomorrow.  Since her symptoms are very infrequent, I like to monitor longer and will arrange for 2-week ZIO patch.  She does have an upcoming sleep study because of significant daytime sleepiness, nonrestorative sleep, and loud snoring.  This could be playing a role in her irregular heartbeat.  I suggest following up on this.  Finally, she does have a history of hypothyroidism.  Recent studies have indicated a declining TSH however still within normal limits.  Her last study was in July.  I would like to repeat a TSH and a lipid profile.  Her last LDL cholesterol was 160 and  again she is not on treatment.  We may need to consider addressing this based on her emerging risk factors.  Thanks again for the kind referral.  Brittney Casino, MD, FACC, [place] Director of the Advanced Lipid Disorders &  Cardiovascular Risk Reduction Clinic Diplomate of the American Board of Clinical Lipidology Attending Cardiologist  Direct Dial: [phone]  Fax: [phone]  Website:  [email or web address] 07/12/2018, 4:47 PM

## 2018-07-13 ENCOUNTER — Ambulatory Visit (INDEPENDENT_AMBULATORY_CARE_PROVIDER_SITE_OTHER): Payer: BC Managed Care – PPO

## 2018-07-13 DIAGNOSIS — I499 Cardiac arrhythmia, unspecified: Secondary | ICD-10-CM | POA: Diagnosis not present

## 2018-07-13 DIAGNOSIS — R9431 Abnormal electrocardiogram [ECG] [EKG]: Secondary | ICD-10-CM

## 2018-07-17 ENCOUNTER — Other Ambulatory Visit: Payer: Self-pay | Admitting: Internal Medicine

## 2018-07-17 ENCOUNTER — Encounter: Payer: Self-pay | Admitting: Internal Medicine

## 2018-07-17 ENCOUNTER — Ambulatory Visit (INDEPENDENT_AMBULATORY_CARE_PROVIDER_SITE_OTHER): Payer: BC Managed Care – PPO

## 2018-07-17 DIAGNOSIS — R9431 Abnormal electrocardiogram [ECG] [EKG]: Secondary | ICD-10-CM | POA: Diagnosis not present

## 2018-07-17 DIAGNOSIS — R079 Chest pain, unspecified: Secondary | ICD-10-CM | POA: Diagnosis not present

## 2018-07-17 DIAGNOSIS — I499 Cardiac arrhythmia, unspecified: Secondary | ICD-10-CM

## 2018-07-20 LAB — EXERCISE TOLERANCE TEST
CSEPHR: 96 %
Estimated workload: 8.5 METS
Exercise duration (min): 7 min
Exercise duration (sec): 0 s
MPHR: 167 {beats}/min
Peak HR: 160 {beats}/min
RPE: 18
Rest HR: 92 {beats}/min

## 2018-07-22 ENCOUNTER — Ambulatory Visit (HOSPITAL_BASED_OUTPATIENT_CLINIC_OR_DEPARTMENT_OTHER): Payer: BC Managed Care – PPO | Attending: Pulmonary Disease | Admitting: Pulmonary Disease

## 2018-07-22 VITALS — Ht 62.0 in | Wt 145.0 lb

## 2018-07-22 DIAGNOSIS — R0683 Snoring: Secondary | ICD-10-CM | POA: Insufficient documentation

## 2018-07-22 DIAGNOSIS — G4733 Obstructive sleep apnea (adult) (pediatric): Secondary | ICD-10-CM | POA: Insufficient documentation

## 2018-07-24 ENCOUNTER — Telehealth: Payer: Self-pay | Admitting: Pulmonary Disease

## 2018-07-24 DIAGNOSIS — G4733 Obstructive sleep apnea (adult) (pediatric): Secondary | ICD-10-CM

## 2018-07-24 DIAGNOSIS — R0683 Snoring: Secondary | ICD-10-CM

## 2018-07-24 NOTE — Telephone Encounter (Signed)
Dr. Halford Chessman, please see pt's mychart message and advise on recs for pt. Thanks!

## 2018-07-24 NOTE — Telephone Encounter (Signed)
Called and spoke with patient, she stated that she had the study done on 07/22/18 and was given a mask after the study and was told by the tech that she had too many occurrences during the night and will need a CPAP machine. Patient stated that she would like her results and wants to know when she will get the machine. I advised patient that VS is out of the office until 07/31/18 and she stated that she only wanted him to result these. VS please advise, thank you.

## 2018-07-25 ENCOUNTER — Encounter (HOSPITAL_COMMUNITY): Payer: BC Managed Care – PPO

## 2018-07-31 DIAGNOSIS — R0683 Snoring: Secondary | ICD-10-CM | POA: Diagnosis not present

## 2018-07-31 NOTE — Telephone Encounter (Signed)
Pt is returning call. Pt is upset that no one is avail to take her call. Requesting she get a call back in the next 84mins before she goes back to work. Cb is [phone].

## 2018-07-31 NOTE — Telephone Encounter (Signed)
PSG 07/22/18 >> AHI 33.8, SpO2 low 81%.  CPAP 10 cm H2O >> AHI 0.   Please let her know her sleep study showed severe sleep apnea.  Please arrange for CPAP 10 cm H2O with heated humidity and mask of choice.  She needs ROV with me or NP in 2 months after starting CPAP.

## 2018-07-31 NOTE — Telephone Encounter (Signed)
Pt is wanting to know if VS can review stress test that she had performed at Dr. Lysbeth Penner office. She is concerned about pulm htn and is wanting to know if VS will be willing to review this for her.  Dr. Halford Chessman, please advise on this for pt. This was performed 07/17/18. Thanks!

## 2018-07-31 NOTE — Telephone Encounter (Signed)
Pt is calling back [phone]

## 2018-07-31 NOTE — Procedures (Signed)
Patient Name: Brittney Vance, Brittney Vance Date: 07/22/2018   Gender: Female  D.O.B: [birth year]  Age (years): 19  Referring Provider: Chesley Mires MD, ABSM  Height (inches): 62  Interpreting Physician: Chesley Mires MD, ABSM  Weight (lbs): 145  RPSGT: [place], [place]  BMI: 27  MRN: [id number]  Neck Size: 13.00  <br> <br>  CLINICAL INFORMATION  Sleep Study Type: Split Night CPAP Indication for sleep study: Hypertension, Snoring Epworth Sleepiness Score: 13 SLEEP STUDY TECHNIQUE  As per the AASM Manual for the Scoring of Sleep and Associated Events v2.3 (April 2016) with a hypopnea requiring 4% desaturations. The channels recorded and monitored were frontal, central and occipital EEG, electrooculogram (EOG), submentalis EMG (chin), nasal and oral airflow, thoracic and abdominal wall motion, anterior tibialis EMG, snore microphone, electrocardiogram, and pulse oximetry. Continuous positive airway pressure (CPAP) was initiated when the patient met split night criteria and was titrated according to treat sleep-disordered breathing. MEDICATIONS  Medications self-administered by patient taken the night of the study : N/A RESPIRATORY PARAMETERS  Diagnostic Total AHI (/hr): 33.8 RDI (/hr): 39.8 OA Index (/hr): 13.3 CA Index (/hr): 0.0  REM AHI (/hr): 91.8 NREM AHI (/hr): 31.0 Supine AHI (/hr): 33.8 Non-supine AHI (/hr): 0  Min O2 Sat (%): 81.0 Mean O2 (%): 93.2 Time below 88% (min): 5.6      Titration Optimal Pressure (cm): 10 AHI at Optimal Pressure (/hr): 0.0 Min O2 at Optimal Pressure (%): 95.0  Supine % at Optimal (%): 100 Sleep % at Optimal (%): 100      SLEEP ARCHITECTURE  The recording time for the entire night was 393.8 minutes. During a baseline period of 211.9 minutes, the patient slept for 181.0 minutes in REM and nonREM, yielding a sleep efficiency of 85.4%%. Sleep onset after lights out was 18.7 minutes with a REM latency of 76.0 minutes. The patient spent 6.9%% of the night in  stage N1 sleep, 69.9%% in stage N2 sleep, 18.5%% in stage N3 and 4.7% in REM. During the titration period of 164.2 minutes, the patient slept for 144.1 minutes in REM and nonREM, yielding a sleep efficiency of 87.8%%. Sleep onset after CPAP initiation was 17.0 minutes with a REM latency of 36.5 minutes. The patient spent 2.8%% of the night in stage N1 sleep, 59.4%% in stage N2 sleep, 9.4%% in stage N3 and 28.4% in REM. CARDIAC DATA  The 2 lead EKG demonstrated sinus rhythm. The mean heart rate was 100.0 beats per minute. Other EKG findings include: None.  LEG MOVEMENT DATA  The total Periodic Limb Movements of Sleep (PLMS) were 0. The PLMS index was 0.0 . IMPRESSIONS  - Severe obstructive sleep apnea occurred during the diagnostic portion of the study (AHI = 33.8/hour). An optimal PAP pressure was selected for this patient ( 10 cm of water)  - No significant central sleep apnea occurred during the diagnostic portion of the study (CAI = 0.0/hour).  - Mild oxygen desaturation was noted during the diagnostic portion of the study (Min O2 = 81.0%).  - The patient snored with loud snoring volume during the diagnostic portion of the study.  - No cardiac abnormalities were noted during this study.  - Clinically significant periodic limb movements did not occur during sleep. DIAGNOSIS  - Obstructive Sleep Apnea (327.23 [G47.33 ICD-10]) RECOMMENDATIONS  - Trial of CPAP therapy on 10 cm H2O with a Small size Resmed Nasal Pillow Mask AirFit P30 mask and heated humidification. [Electronically signed] 07/31/2018 08:43 AM Chesley Mires MD, ABSM  Diplomate, Tax adviser of Sleep Medicine  NPI: [id number]

## 2018-07-31 NOTE — Telephone Encounter (Signed)
Patient returning call, CB is [phone]

## 2018-07-31 NOTE — Telephone Encounter (Signed)
Attempted to call pt but unable to reach her and unable to leave a VM due to mailbox being full. Will try to call back later. 

## 2018-07-31 NOTE — Telephone Encounter (Signed)
Called and spoke with pt letting her know the results of HST and stated to her that SN wants her to begin cpap. Pt expressed understanding. F/U appt scheduled for pt after cpap start and order has been placed for pt to begin cpap. Nothing further needed.

## 2018-08-01 ENCOUNTER — Encounter: Payer: Self-pay | Admitting: Internal Medicine

## 2018-08-02 LAB — LIPID PANEL
Chol/HDL Ratio: 3.1 ratio (ref 0.0–4.4)
Cholesterol, Total: 213 mg/dL — ABNORMAL HIGH (ref 100–199)
HDL: 69 mg/dL (ref >39)
LDL Calculated: 136 mg/dL — ABNORMAL HIGH (ref 0–99)
Triglycerides: 42 mg/dL (ref 0–149)
VLDL Cholesterol Cal: 8 mg/dL (ref 5–40)

## 2018-08-02 LAB — TSH: TSH: 1.21 u[IU]/mL (ref 0.450–4.500)

## 2018-08-04 ENCOUNTER — Encounter (HOSPITAL_BASED_OUTPATIENT_CLINIC_OR_DEPARTMENT_OTHER): Payer: BC Managed Care – PPO

## 2018-08-17 ENCOUNTER — Ambulatory Visit: Payer: BC Managed Care – PPO | Admitting: Internal Medicine

## 2018-08-17 ENCOUNTER — Encounter: Payer: Self-pay | Admitting: Internal Medicine

## 2018-08-17 VITALS — BP 138/72 | HR 100 | Ht 62.0 in | Wt 151.8 lb

## 2018-08-17 DIAGNOSIS — R9431 Abnormal electrocardiogram [ECG] [EKG]: Secondary | ICD-10-CM | POA: Diagnosis not present

## 2018-08-17 DIAGNOSIS — E7800 Pure hypercholesterolemia, unspecified: Secondary | ICD-10-CM

## 2018-08-17 DIAGNOSIS — I499 Cardiac arrhythmia, unspecified: Secondary | ICD-10-CM | POA: Diagnosis not present

## 2018-08-17 NOTE — Progress Notes (Signed)
OFFICE CONSULT NOTE  Chief Complaint:  Chest pain, irregular heart rate  Primary Care Physician: Brittney Mclean, MD  HPI:  Brittney Vance is a 53 y.o. female who is being seen today for the evaluation of chest pain, irregular heart rate at the request of Copland, Gay Filler, MD.  This is a pleasant 53 year old female was kindly referred for evaluation of chest pain and irregular heartbeat.  Recently she had an episode of burning type chest discomfort.  It came on at rest and went away spontaneously.  She is not had episodes like this before.  She went to [facility] and had work-up including EKG and troponins which were negative.  She was recommended to follow-up with cardiology.  Subsequently she is being worked up for possible obstructive sleep apnea.  She is monitoring home blood pressures and her cuff noted an irregular heart rate.  Based on this her pulmonologist/sleep doctor ordered an EKG which did not show any A. fib or irregularity.  She was then referred by Dr. Edilia Vance for further evaluation.  Currently she denies any chest pain, palpitations or worsening shortness of breath.  The chest discomfort that took her to med Center included left arm pain again at rest.  It was described as a pressure or burning sensation and more like 4 5 out of 10 intensity.  08/17/2018  Brittney Vance is seen today in follow-up.  She underwent exercise treadmill testing which was negative for ischemia and showed normal heart rate augmentation.  She also wore a long-term monitor which failed to show any significant arrhythmias.  He was noted to have rare PACs and PVCs and this could explain why her blood pressure cuff is reading irregular heartbeat when it takes her blood pressure.  She is asymptomatic.  She was diagnosed with severe obstructive sleep apnea and now has been fitted with CPAP.  Is possible that her sleep apnea may be the reason for her tachycardia and this may improve with treatment.  PMHx:  Past  Medical History:  Diagnosis Date  . Breast cancer ([place]) 2015   left, triple negative   . Depression 09/23/2014  . Fibroids   . Hypertension   . Hypothyroid     Past Surgical History:  Procedure Laterality Date  . AXILLARY LYMPH NODE BIOPSY Left 10/30/2013   Procedure: AXILLARY LYMPH NODE BIOPSY;  Surgeon: [name], MD;  Location: [place];  Service: General;  Laterality: Left;  . BREAST BIOPSY Left 04/24/2013  . BREAST RECONSTRUCTION WITH PLACEMENT OF TISSUE EXPANDER AND FLEX HD (ACELLULAR HYDRATED DERMIS) Bilateral 10/30/2013   Procedure: BILATERAL BREAST RECONSTRUCTION WITH PLACEMENT OF TISSUE EXPANDER AND FLEX HD (ACELLULAR HYDRATED DERMIS);  Surgeon: [name], MD;  Location: [place];  Service: Plastics;  Laterality: Bilateral;  . MASTECTOMY    . PORT-A-CATH REMOVAL Right 10/30/2013   Procedure: REMOVAL PORT-A-CATH;  Surgeon: [name], MD;  Location: [place];  Service: General;  Laterality: Right;  . PORTACATH PLACEMENT Right 04/27/2013   Procedure: ULTRASOUND GUIDED PORT INSERTION WITH FLUOROSCOPY;  Surgeon: [name], MD;  [facility];  Service: General;  Laterality: Right;  . TOTAL MASTECTOMY Bilateral 10/30/2013   Procedure: TOTAL MASTECTOMY;  Surgeon: [name], MD;  Location: [place];  Service: General;  Laterality: Bilateral;  . TUBAL LIGATION      FAMHx:  Family History  Problem Relation Age of Onset  . Hypertension Mother   . Diabetes Mother   . Alzheimer's disease Mother   .  Stroke Mother   . Hypertension Father   . Diabetes Father   . Hypertension Brother   . Epilepsy Son 4       being worked up for autism  . Thyroid disease Neg Hx     SOCHx:   reports that she has never smoked. She has never used smokeless tobacco. She reports current alcohol use of about 2.0 standard drinks of alcohol per week. She reports that she does not use drugs.  ALLERGIES:  No Known Allergies  ROS: Pertinent items noted in HPI and remainder of  comprehensive ROS otherwise negative.  HOME MEDS: Current Outpatient Medications on File Prior to Visit  Medication Sig Dispense Refill  . levothyroxine (SYNTHROID) 75 MCG tablet Take 1 tablet (75 mcg total) by mouth daily. 90 tablet 3   No current facility-administered medications on file prior to visit.     LABS/IMAGING: No results found for this or any previous visit (from the past 48 hour(s)). No results found.  LIPID PANEL:    Component Value Date/Time   CHOL 213 (H) 08/01/2018 0932   TRIG 42 08/01/2018 0932   HDL 69 08/01/2018 0932   CHOLHDL 3.1 08/01/2018 0932   CHOLHDL 4 02/15/2018 1339   VLDL 16.8 02/15/2018 1339   LDLCALC 136 (H) 08/01/2018 0932    WEIGHTS: Wt Readings from Last 3 Encounters:  08/17/18 151 lb 12.8 oz (68.9 kg)  07/22/18 145 lb (65.8 kg)  07/12/18 148 lb 6.4 oz (67.3 kg)    VITALS: BP 138/72   Pulse 100   Ht 5\' 2"  (1.575 m)   Wt 151 lb 12.8 oz (68.9 kg)   LMP 03/10/2013 (LMP Unknown)   BMI 27.76 kg/m   EXAM: Deferred  EKG: Deferred  ASSESSMENT: 1. Chest and left arm pain -negative exercise treadmill stress test (06/2018) 2. Irregular heartbeat -infrequent PACs and PVCs but no significant arrhythmias 3. Tachycardia 4. OSA-now on CPAP  PLAN: 1.   [name] has had tachycardia and was diagnosed with severe obstructive sleep apnea.  She is now on CPAP and I suspect possibly an improvement in heart rate after she wears her CPAP for a while.  Her stress test was negative for ischemia.  There were no significant findings on her monitor.  I would not recommend further work-up at this time.  If she is symptomatic with tachycardia 1 could consider a beta-blocker.  Also her LDL cholesterol is 136.  Goal is most likely less than 100.  I will defer to her PCP but would consider a lower dose high potency statin such as atorvastatin 40 or rosuvastatin 20 mg daily.  Follow-up with me as needed.  Brittney Casino, MD, [facility], [place] Director of the Advanced Lipid Disorders &  Cardiovascular Risk Reduction Clinic Diplomate of the American Board of Clinical Lipidology Attending Cardiologist  Direct Dial: [phone]  Fax: [phone]  Website:  [email or web address]  Nadean Corwin Arelia Volpe 08/17/2018, 2:13 PM

## 2018-08-17 NOTE — Patient Instructions (Signed)
Medication Instructions:  Continue current medications If you need a refill on your cardiac medications before your next appointment, please call your pharmacy.   Follow-Up: At [facility], you and your health needs are our priority.  As part of our continuing mission to provide you with exceptional heart care, we have created designated Provider Care Teams.  These Care Teams include your primary Cardiologist (physician) and Advanced Practice Providers (APPs -  Physician Assistants and Nurse Practitioners) who all work together to provide you with the care you need, when you need it. You will need a follow up appointment as needed.  Should you need an appointment, you will see Dr. Debara Pickett or one of the following Advanced Practice Providers on your designated Care Team: Almyra Deforest, [place] . Fabian Sharp, PA-C  Any Other Special Instructions Will Be Listed Below (If Applicable).

## 2018-10-05 ENCOUNTER — Encounter: Payer: Self-pay | Admitting: Family Medicine

## 2018-10-05 ENCOUNTER — Ambulatory Visit: Payer: BC Managed Care – PPO | Admitting: Family Medicine

## 2018-10-05 ENCOUNTER — Other Ambulatory Visit: Payer: Self-pay

## 2018-10-05 VITALS — BP 138/80 | HR 104 | Temp 102.1°F | Ht 62.0 in | Wt 150.1 lb

## 2018-10-05 DIAGNOSIS — R6889 Other general symptoms and signs: Secondary | ICD-10-CM | POA: Diagnosis not present

## 2018-10-05 LAB — POCT INFLUENZA A/B
INFLUENZA A, POC: NEGATIVE
Influenza B, POC: NEGATIVE

## 2018-10-05 MED ORDER — OSELTAMIVIR PHOSPHATE 75 MG PO CAPS: 75.0000 mg | ORAL_CAPSULE | Freq: Two times a day (BID) | ORAL | 0 refills | Status: AC

## 2018-10-05 NOTE — Progress Notes (Signed)
Chief Complaint  Patient presents with  . Fever  . Generalized Body Aches  . Diarrhea  . Sore Throat    [place] here for URI complaints.  Duration: 1 day  Associated symptoms: Fever (subjective), rhinorrhea, sore throat, myalgia Denies: sinus congestion, sinus pain, itchy watery eyes, ear pain, ear drainage, wheezing, shortness of breath, chest tightness and cough Treatment to date: cough drops Sick contacts: Yes- works at [employer]:  Const: +subj fevers HEENT: As noted in HPI Lungs: No SOB  Past Medical History:  Diagnosis Date  . Breast cancer ([place]) 2015   left, triple negative   . Depression 09/23/2014  . Fibroids   . Hypertension   . Hypothyroid     BP 138/80 (BP Location: Left Arm, Patient Position: Sitting, Cuff Size: Normal)   Pulse (!) 104   Temp (!) 102.1 F (38.9 C) (Oral)   Ht 5\' 2"  (1.575 m)   Wt 150 lb 2 oz (68.1 kg)   LMP 03/10/2013 (LMP Unknown)   SpO2 94%   BMI 27.46 kg/m  General: Awake, alert, appears stated age HEENT: AT, [place], ears patent b/l and TM's neg, nares patent w/o discharge, pharynx pink and without exudates, MMM Neck: No masses or asymmetry Heart: RRR Lungs: CTAB, no accessory muscle use Psych: Age appropriate judgment and insight, normal mood and affect  Flu-like symptoms - Plan: oseltamivir (TAMIFLU) 75 MG capsule, POCT Influenza A/B, Novel Coronavirus 2019  Orders as above. Although unlikely to be Covid, will check given her close proximity to other people and fevers. I initially thought it was a 2 day turnaround but it sounds more like 5 days. Will inform patient to stay home until then. Continue to push fluids, practice good hand hygiene, cover mouth when coughing. F/u prn. If starting to experience increasing fevers, shaking, or shortness of breath, seek immediate care. Pt voiced understanding and agreement to the plan.  [place], DO 10/05/18 5:01 PM

## 2018-10-05 NOTE — Patient Instructions (Signed)
Continue to push fluids, practice good hand hygiene, and cover your mouth if you cough.  If you start having increasing fevers, shaking or shortness of breath, seek immediate care.  For symptoms, consider using Vick's VapoRub on chest or under nose, air humidifier, Benadryl at night, and elevating the head of the bed. Tylenol and ibuprofen for aches and pains you may be experiencing.   OK to take Tylenol 1000 mg (2 extra strength tabs) or 975 mg (3 regular strength tabs) every 6 hours as needed.  Ibuprofen 400-600 mg (2-3 over the counter strength tabs) every 6 hours as needed for pain.  Let us know if you need anything.

## 2018-10-06 ENCOUNTER — Encounter: Payer: Self-pay | Admitting: Family Medicine

## 2018-10-09 LAB — SARS-COV-2 RNA, QUALITATIVE REAL-TIME RT-PCR: SARS CoV 2 RNA, RT PCR: NOT DETECTED

## 2018-10-27 ENCOUNTER — Encounter: Payer: Self-pay | Admitting: Oncology

## 2018-10-30 NOTE — Progress Notes (Signed)
I called the patient for a WebEx visit today but she says she had sent a message through my chart stating that what she really wants is to move the appointment to a renal appointment with labs sometime in July.  Accordingly we did not have a visit today.  She says she is doing okay and she is taking appropriate pandemic precautions  She will see me in July and that likely will be her graduation visit unless she wishes to participate in survivorship

## 2018-10-31 ENCOUNTER — Telehealth: Payer: Self-pay | Admitting: Oncology

## 2018-10-31 ENCOUNTER — Ambulatory Visit: Payer: BC Managed Care – PPO | Admitting: Adult Health

## 2018-10-31 ENCOUNTER — Ambulatory Visit: Payer: BC Managed Care – PPO | Admitting: Pulmonary Disease

## 2018-10-31 ENCOUNTER — Inpatient Hospital Stay: Payer: BC Managed Care – PPO | Attending: Oncology | Admitting: Oncology

## 2018-10-31 ENCOUNTER — Ambulatory Visit (INDEPENDENT_AMBULATORY_CARE_PROVIDER_SITE_OTHER): Payer: BC Managed Care – PPO | Admitting: Adult Health

## 2018-10-31 ENCOUNTER — Other Ambulatory Visit: Payer: Self-pay

## 2018-10-31 ENCOUNTER — Inpatient Hospital Stay: Payer: BC Managed Care – PPO

## 2018-10-31 ENCOUNTER — Encounter: Payer: Self-pay | Admitting: Adult Health

## 2018-10-31 DIAGNOSIS — E663 Overweight: Secondary | ICD-10-CM | POA: Diagnosis not present

## 2018-10-31 DIAGNOSIS — C50412 Malignant neoplasm of upper-outer quadrant of left female breast: Secondary | ICD-10-CM

## 2018-10-31 DIAGNOSIS — G4733 Obstructive sleep apnea (adult) (pediatric): Secondary | ICD-10-CM | POA: Diagnosis not present

## 2018-10-31 DIAGNOSIS — Z171 Estrogen receptor negative status [ER-]: Secondary | ICD-10-CM

## 2018-10-31 NOTE — Telephone Encounter (Signed)
Scheduled appt per 4/6 sch message message for patient and sent reminder letter

## 2018-10-31 NOTE — Patient Instructions (Signed)
Continue on CPAP at bedtime Keep up the good work Work on [employer] Do not drive if sleepy Follow-up in 4 to 6 months with Dr. Halford Chessman or Mikena Masoner NP and As needed

## 2018-10-31 NOTE — Addendum Note (Signed)
Addended by: Valerie Salts on: 10/31/2018 03:25 PM   Modules accepted: Orders

## 2018-10-31 NOTE — Telephone Encounter (Signed)
Left voicemail for patient regarding her Webex visit for today. Let her know that her lab was cancelled and to not come in for her appointment today. Left her instructions to download the app and I sent the join link to [email or web address]

## 2018-10-31 NOTE — Progress Notes (Signed)
Virtual Visit via Telephone Note  I connected with Brittney Vance on 10/31/18 at  2:30 PM EDT by telephone and verified that I am speaking with the correct person using two identifiers.   I discussed the limitations, risks, security and privacy concerns of performing an evaluation and management service by telephone and the availability of in person appointments. I also discussed with the patient that there may be a patient responsible charge related to this service. The patient expressed understanding and agreed to proceed.   History of Present Illness: Today's tele-visit is for follow-up of sleep apnea.  Patient is present for today's visit at home, myself present for today's visit at office 53 year old female seen for sleep consult July 31, 2017 for sleep concerns found to have severe sleep apnea started on nocturnal CPAP   Patient is for a follow-up for sleep apnea.  Patient was recently seen for sleep consult for sleep issues.  She underwent a sleep study December 2019 found to have severe sleep apnea with a AHI at 33, SPO2 low at 81%.  Optimal CPAP control at 10 cm H2O.  Patient was started on nocturnal CPAP.  Since last visit patient says she is doing better, feels more rested , feels she benefits from CPAP . Does not think she is snoring, husband has not mentioned it.   CPAP download shows good compliance with 93% usage.  Daily average usage at 6 hours.  Patient is on CPAP 10 cm H2O.  AHI 4.7.  Minimum leaks.     Observations/Objective: PSG 07/22/18 >> AHI 33.8, SpO2 low 81%.  CPAP 10 cm H2O >> AHI 0. Echo 04/30/13 >> mild LVH, EF 55 to 60%, grade 1 DD  Assessment and Plan: Severe sleep apnea excellent control on CPAP  Overweight-discussed healthy weight loss  Plan  Patient Instructions  Continue on CPAP at bedtime Keep up the good work Work on healthy weight Do not drive if sleepy Follow-up in 4 to 6 months with Dr. Halford Chessman or Jochebed Bills NP and As needed         Follow Up  Instructions: Follow up in 4-6 months and As needed      I discussed the assessment and treatment plan with the patient. The patient was provided an opportunity to ask questions and all were answered. The patient agreed with the plan and demonstrated an understanding of the instructions.   The patient was advised to call back or seek an in-person evaluation if the symptoms worsen or if the condition fails to improve as anticipated.  I provided 22 minutes of non-face-to-face time during this encounter.   Rexene Edison, NP

## 2018-11-16 NOTE — Telephone Encounter (Signed)
Contacted [employer] and spoke with Lovena Le stating to her that pt was still waiting for supplies for her cpap as it has been 4 months since she has not had supplies. Lovena Le stated to me that there was a message in pt's file to where they should be shipping supplies out to pt but she does not see to where this had been done yet. Lovena Le ensured me that they will make sure pt does have her supplies shipped out to her. Sent a reply to [employer] encounter with the info I found out from [place] with [employer]. Closing encounter.

## 2018-12-04 ENCOUNTER — Other Ambulatory Visit: Payer: Self-pay

## 2018-12-04 ENCOUNTER — Telehealth: Payer: Self-pay

## 2018-12-04 ENCOUNTER — Ambulatory Visit (INDEPENDENT_AMBULATORY_CARE_PROVIDER_SITE_OTHER): Payer: BC Managed Care – PPO

## 2018-12-04 ENCOUNTER — Encounter (HOSPITAL_COMMUNITY): Payer: Self-pay | Admitting: *Deleted

## 2018-12-04 ENCOUNTER — Ambulatory Visit (HOSPITAL_COMMUNITY)
Admission: EM | Admit: 2018-12-04 | Discharge: 2018-12-04 | Disposition: A | Discharge status: Home / Self Care | Payer: BC Managed Care – PPO | Attending: Emergency Medicine | Admitting: Emergency Medicine

## 2018-12-04 DIAGNOSIS — I10 Essential (primary) hypertension: Secondary | ICD-10-CM

## 2018-12-04 DIAGNOSIS — R109 Unspecified abdominal pain: Secondary | ICD-10-CM

## 2018-12-04 DIAGNOSIS — M545 Low back pain, unspecified: Secondary | ICD-10-CM

## 2018-12-04 DIAGNOSIS — R112 Nausea with vomiting, unspecified: Secondary | ICD-10-CM

## 2018-12-04 HISTORY — DX: Sleep apnea, unspecified: G47.30

## 2018-12-04 LAB — POCT URINALYSIS DIP (DEVICE)
Glucose, UA: NEGATIVE mg/dL
Leukocytes,Ua: NEGATIVE
Nitrite: NEGATIVE
Protein, ur: 30 mg/dL — AB
Specific Gravity, Urine: >=1.03 (ref 1.005–1.030)
Urobilinogen, UA: 0.2 mg/dL (ref 0.0–1.0)
pH: 5 (ref 5.0–8.0)

## 2018-12-04 MED ORDER — KETOROLAC TROMETHAMINE 60 MG/2ML IM SOLN
60.0000 mg | Freq: Once | INTRAMUSCULAR | Status: AC
  Administered 2018-12-04: 10:00:00 60 mg via INTRAMUSCULAR

## 2018-12-04 MED ORDER — ONDANSETRON 4 MG PO TBDP
4.0000 mg | ORAL_TABLET | Freq: Once | ORAL | Status: AC
  Administered 2018-12-04: 4 mg via ORAL

## 2018-12-04 MED ORDER — ONDANSETRON 4 MG PO TBDP
ORAL_TABLET | ORAL | Status: AC
  Filled 2018-12-04: qty 1

## 2018-12-04 MED ORDER — KETOROLAC TROMETHAMINE 60 MG/2ML IM SOLN
INTRAMUSCULAR | Status: AC
  Filled 2018-12-04: qty 2

## 2018-12-04 MED ORDER — ONDANSETRON 4 MG PO TBDP: 4.0000 mg | ORAL_TABLET | Freq: Three times a day (TID) | ORAL | 0 refills | Status: DC | PRN

## 2018-12-04 MED ORDER — CYCLOBENZAPRINE HCL 5 MG PO TABS: 5.0000 mg | ORAL_TABLET | Freq: Every day | ORAL | 0 refills | Status: DC

## 2018-12-04 MED ORDER — ACETAMINOPHEN 500 MG PO TABS: 500.0000 mg | ORAL_TABLET | Freq: Four times a day (QID) | ORAL | 0 refills | Status: DC | PRN

## 2018-12-04 NOTE — Telephone Encounter (Signed)
Called patient to schedule appointment, she was already at an urgent care.

## 2018-12-04 NOTE — Discharge Instructions (Signed)
Small frequent sips of fluids- Pedialyte, Gatorade, water, broth- to maintain hydration.   Zofran every 8 hours as needed for nausea or vomiting.   Tylenol as needed for pain as this is less likely to cause stomach upset.  Flexeril as needed as a muscle relaxer. May cause drowsiness. Please do not take if driving or drinking alcohol.  Light and regular activity as tolerated.  Please go to the ER if develop worsening of pain, fevers, uncontrolled vomiting, no urination in 8-10 hours or dehydration, blood in vomiting, weakness, loss of consciousness, or otherwise worsening.

## 2018-12-04 NOTE — ED Provider Notes (Signed)
[place]    CSN: [id number] Arrival date & time: 12/04/18  0737     History   Chief Complaint Chief Complaint  Patient presents with  . Back Pain  . Emesis    HPI Brittney Vance is a 53 y.o. female.   Brittney Vance presents with complaints of low back pain as well as chills, nausea and vomiting. Started out as fatigue 5/8, she had stayed in a hotel in [place] after an appointment with her son there. She had lifted suitcases into the car. Night of 5/8 once home she states she slept 11 hours, which is unusual for her. 5/9 noted the back pain. Yesterday, 5/10, vomiting started. Had approximately 3-4 episodes of non bloody emesis. Denies any specific nausea until immediately prior to emesis. Has had one episode today of vomiting. Hasn't eaten or drank anything today. Vomited up the sprite and ginger ale she drank yesterday. Last BM approximately 2-3 days ago, no diarrhea, no blood in stool. No known fevers. No known ill contacts. No specific known ill contacts. Denies any known concerning food intake. Took pepto bismol yesterday which initially helped. Denies any urinary symptoms. No vaginal symptoms. Denies any previous back pain. No URI symptoms. Has applied icy hot to her back which helps. Today she does feel mildly light headed. Hx of breast cancer, treatment completed in 2015. Hx of tubal ligation. Negative covid-19 test 3/12.     ROS per HPI, negative if not otherwise mentioned.      Past Medical History:  Diagnosis Date  . Breast cancer ([place]) 2015   left, triple negative   . Depression 09/23/2014  . Fibroids   . Hypertension   . Hypothyroid   . Sleep apnea     Patient Active Problem List   Diagnosis Date Noted  . Goiter 02/06/2015  . Fibroid, uterine 10/03/2014  . Pain in joint, pelvic region and thigh 09/23/2014  . Depression 09/23/2014  . Genital herpes 03/15/2014  . Hypokalemia 10/03/2013  . Anemia associated with chemotherapy 09/17/2013  .  Neutrophils decreased ([place]) 08/13/2013  . Malignant neoplasm of upper-outer quadrant of left breast in female, estrogen receptor negative ([place]) 04/26/2013  . Hypothyroidism 01/28/2013  . Hypertension 01/25/2013  . Perimenopause 01/25/2013    Past Surgical History:  Procedure Laterality Date  . AXILLARY LYMPH NODE BIOPSY Left 10/30/2013   Procedure: AXILLARY LYMPH NODE BIOPSY;  Surgeon: [name], MD;  Location: [facility];  Service: General;  Laterality: Left;  . BREAST BIOPSY Left 04/24/2013  . BREAST RECONSTRUCTION WITH PLACEMENT OF TISSUE EXPANDER AND FLEX HD (ACELLULAR HYDRATED DERMIS) Bilateral 10/30/2013   Procedure: BILATERAL BREAST RECONSTRUCTION WITH PLACEMENT OF TISSUE EXPANDER AND FLEX HD (ACELLULAR HYDRATED DERMIS);  Surgeon: [name], MD;  Location: [facility];  Service: Plastics;  Laterality: Bilateral;  . MASTECTOMY    . PORT-A-CATH REMOVAL Right 10/30/2013   Procedure: REMOVAL PORT-A-CATH;  Surgeon: [name], MD;  Location: [facility];  Service: General;  Laterality: Right;  . PORTACATH PLACEMENT Right 04/27/2013   Procedure: ULTRASOUND GUIDED PORT INSERTION WITH FLUOROSCOPY;  Surgeon: [name], MD;  Location: [facility];  Service: General;  Laterality: Right;  . TOTAL MASTECTOMY Bilateral 10/30/2013   Procedure: TOTAL MASTECTOMY;  Surgeon: [name], MD;  Location: [facility];  Service: General;  Laterality: Bilateral;  . TUBAL LIGATION      OB History    Gravida  2   Para  2   Term  Preterm      AB      Living        SAB      TAB      Ectopic      Multiple      Live Births               Home Medications    Prior to Admission medications   Medication Sig Start Date End Date Taking? Authorizing Provider  levothyroxine (SYNTHROID) 75 MCG tablet Take 1 tablet (75 mcg total) by mouth daily. 02/17/18  Yes [name], MD  acetaminophen (TYLENOL) 500 MG tablet Take 1 tablet (500 mg total) by mouth every 6 (six) hours as needed. 12/04/18    Zigmund Gottron, NP  cyclobenzaprine (FLEXERIL) 5 MG tablet Take 1 tablet (5 mg total) by mouth at bedtime. 12/04/18   Zigmund Gottron, NP  ondansetron (ZOFRAN-ODT) 4 MG disintegrating tablet Take 1 tablet (4 mg total) by mouth every 8 (eight) hours as needed for nausea or vomiting. 12/04/18   Zigmund Gottron, NP    Family History Family History  Problem Relation Age of Onset  . Hypertension Mother   . Diabetes Mother   . Alzheimer's disease Mother   . Stroke Mother   . Hypertension Father   . Diabetes Father   . Hypertension Brother   . Epilepsy Son 4       being worked up for autism  . Thyroid disease Neg Hx     Social History Social History   Tobacco Use  . Smoking status: Never Smoker  . Smokeless tobacco: Never Used  Substance Use Topics  . Alcohol use: Not Currently  . Drug use: No     Allergies   Patient has no known allergies.   Review of Systems Review of Systems   Physical Exam Triage Vital Signs ED Triage Vitals  Enc Vitals Group     BP 12/04/18 0842 (!) 151/97     Pulse Rate 12/04/18 0842 100     Resp 12/04/18 0842 18     Temp 12/04/18 0842 98.5 F (36.9 C)     Temp Source 12/04/18 0842 Oral     SpO2 12/04/18 0842 97 %     Weight --      Height --      Head Circumference --      Peak Flow --      Pain Score 12/04/18 0844 6     Pain Loc --      Pain Edu? --      Excl. in [place]? --    Orthostatic VS for the past 24 hrs:  BP- Lying Pulse- Lying BP- Sitting Pulse- Sitting BP- Standing at 0 minutes Pulse- Standing at 0 minutes  12/04/18 0844 (!) 150/100 98 (!) 149/93 102 (!) 143/96 107    Updated Vital Signs BP (!) 151/97   Pulse 100   Temp 98.5 F (36.9 C) (Oral)   Resp 18   LMP 03/10/2013 (LMP Unknown)   SpO2 97%   Visual Acuity Right Eye Distance:   Left Eye Distance:   Bilateral Distance:    Right Eye Near:   Left Eye Near:    Bilateral Near:     Physical Exam Constitutional:      General: She is not in acute distress.     Appearance: She is well-developed. She is ill-appearing.  HENT:     Head: Normocephalic.  Cardiovascular:     Rate and  Rhythm: Normal rate and regular rhythm.     Heart sounds: Normal heart sounds.  Pulmonary:     Effort: Pulmonary effort is normal.     Breath sounds: Normal breath sounds.  Abdominal:     General: Bowel sounds are decreased. There is distension.     Tenderness: There is abdominal tenderness in the suprapubic area, left upper quadrant and left lower quadrant. There is no right CVA tenderness, left CVA tenderness, guarding or rebound.     Comments: Mild distention noted to abdomen with left sided pain on palpation  Musculoskeletal:     Lumbar back: She exhibits pain. She exhibits normal range of motion, no tenderness, no bony tenderness, no swelling, no edema, no deformity and no laceration.       Back:     Comments: No point tenderness or reproducible back pain with palpation; mild pain with left hip flexion to low back; pain with transition from sit to lay and lay to sit ; no radiation of pain to buttocks or thighs; guarded gait noted with ambulation; no step off or deformity to spinous process; strength equal bilaterally; gross sensation intact to lower extremities   Skin:    General: Skin is warm and dry.  Neurological:     Mental Status: She is alert and oriented to person, place, and time.      UC Treatments / Results  Labs (all labs ordered are listed, but only abnormal results are displayed) Labs Reviewed  POCT URINALYSIS DIP (DEVICE) - Abnormal; Notable for the following components:      Result Value   Bilirubin Urine SMALL (*)    Ketones, ur TRACE (*)    Hgb urine dipstick TRACE (*)    Protein, ur 30 (*)    All other components within normal limits    EKG None  Radiology Dg Abd 2 Views  Result Date: 12/04/2018 CLINICAL DATA:  Abdominal pain over the last day, worse on the left. EXAM: ABDOMEN - 2 VIEW COMPARISON:  None. FINDINGS: Gas pattern within  normal limits. Normal amount of fecal matter. No sign obstruction or free air. No worrisome soft tissue calcifications. Previous tubal clips in the pelvis. Thoracolumbar curvature convex to the left. IMPRESSION: No acute or significant finding.  Spinal curvature. Electronically Signed   By: Nelson Chimes M.D.   On: 12/04/2018 09:35    Procedures Procedures (including critical care time)  Medications Ordered in UC Medications  ondansetron (ZOFRAN-ODT) disintegrating tablet 4 mg (4 mg Oral Given 12/04/18 0913)  ketorolac (TORADOL) injection 60 mg (60 mg Intramuscular Given 12/04/18 0950)    Initial Impression / Assessment and Plan / UC Course  I have reviewed the triage vital signs and the nursing notes.  Pertinent labs & imaging results that were available during my care of the patient were reviewed by me and considered in my medical decision making (see chart for details).     Urine consistent with dehydration, patient endorses no intake yet today and limited yesterday. Tolerating PO fluids s/p zofran here in clinic. Afebrile. No tachycardia. No obvious obstruction on abd film. Diverticulitis vs pancreatitis vs ileus vs constipation vs nephrolithiasis vs other intraabdominal etiology considered and discussed. Low back pain after lifting suitcases, in further discussed patient states she has had to have a steroid injection due to low back pain in the past. No red flag findings with low back pain. Trial of supportive cares here today with toradol and zofran. Tolerating po. Strict return/er precautions discussed. Patient  verbalized understanding and agreeable to plan.  Ambulatory out of clinic without difficulty.    Final Clinical Impressions(s) / UC Diagnoses   Final diagnoses:  Acute bilateral low back pain without sciatica  Intractable vomiting with nausea, unspecified vomiting type  Left sided abdominal pain of unknown cause     Discharge Instructions     Small frequent sips of fluids-  Pedialyte, Gatorade, water, broth- to maintain hydration.   Zofran every 8 hours as needed for nausea or vomiting.   Tylenol as needed for pain as this is less likely to cause stomach upset.  Flexeril as needed as a muscle relaxer. May cause drowsiness. Please do not take if driving or drinking alcohol.  Light and regular activity as tolerated.  Please go to the ER if develop worsening of pain, fevers, uncontrolled vomiting, no urination in 8-10 hours or dehydration, blood in vomiting, weakness, loss of consciousness, or otherwise worsening.     ED Prescriptions    Medication Sig Dispense Auth. Provider   ondansetron (ZOFRAN-ODT) 4 MG disintegrating tablet Take 1 tablet (4 mg total) by mouth every 8 (eight) hours as needed for nausea or vomiting. 12 tablet Augusto Gamble B, NP   acetaminophen (TYLENOL) 500 MG tablet Take 1 tablet (500 mg total) by mouth every 6 (six) hours as needed. 30 tablet Augusto Gamble B, NP   cyclobenzaprine (FLEXERIL) 5 MG tablet Take 1 tablet (5 mg total) by mouth at bedtime. 15 tablet Zigmund Gottron, NP     Controlled Substance Prescriptions [place] Controlled Substance Registry consulted? Not Applicable   Zigmund Gottron, NP 12/04/18 1012

## 2018-12-04 NOTE — Telephone Encounter (Signed)
Copied from [place] [id number]. Topic: General - Other >> Dec 04, 2018  8:01 AM Carolyn Stare wrote:  Pt req an appt for nausea and back pain

## 2018-12-04 NOTE — ED Notes (Signed)
PO fluids provided with instructions to take small, frequent sips.  Pt verbalized understanding.

## 2018-12-04 NOTE — ED Notes (Signed)
Pt tolerating sips PO fluids.  States feels comfortable to go home now.  Instructed to go to ED for any worsening sxs.  Pt verbalized understanding.

## 2018-12-04 NOTE — ED Triage Notes (Signed)
C/O middle low back pain 2 days ago with a milder pain in right hip.  Started yesterday with vomiting and now feeling chills.  States unable to keep down any PO fluids.  Pt guarded when ambulating.

## 2019-01-01 ENCOUNTER — Encounter: Payer: Self-pay | Admitting: *Deleted

## 2019-01-01 ENCOUNTER — Telehealth: Payer: Self-pay | Admitting: Pulmonary Disease

## 2019-01-01 NOTE — Telephone Encounter (Signed)
Sorry pt wants to know if she is at high risk for COVID because she has sleep apnea.

## 2019-01-01 NOTE — Telephone Encounter (Signed)
Letter sent via my chart.  Nothing further at this time.

## 2019-01-01 NOTE — Telephone Encounter (Signed)
Sorry I can not see the question for this patient

## 2019-01-01 NOTE — Telephone Encounter (Signed)
Yes chronic medical problems can increase your risk for more serious complications of COVID 19 if contracted.  We have a letter on these can send this for her is she needs Please contact office for sooner follow up if symptoms do not improve or worsen or seek emergency care

## 2019-01-01 NOTE — Telephone Encounter (Signed)
Called and spoke with Patient.  TP recommendations given.  Understanding stated.  Patient requested a letter via my chart.  Per TP ok for covid generic letter. Patient stated someone had called her 30 min earlier stating they were from [place] and stated per CDC guidelines, she was not at risk.  No note in chart referring to this call.

## 2019-01-01 NOTE — Telephone Encounter (Signed)
  Pt is associating severe sleep apnea and being at greater risk. Please advise.   Virtual Visit via Telephone Note  I connected withPaula M Smith on 10/31/18 at  2:30 PM EDT by telephoneand verified that I am speaking with the correct person using two identifiers.  I discussed the limitations, risks, security and privacy concerns of performing an evaluation and management service by telephone and the availability of in person appointments. I also discussed with the patient that there may be a patient responsible charge related to this service. The patient expressed understanding and agreed to proceed.   History of Present Illness: Today's tele-visit is for follow-up of sleep apnea.  Patient is present for today's visit at home, myself present for today's visit at office 53 year old female seen for sleep consult July 31, 2017 for sleep concerns found to have severe sleep apnea started on nocturnal CPAP  Patient is for a follow-up for sleep apnea.  Patient was recently seen for sleep consult for sleep issues.  She underwent a sleep study December 2019 found to have severe sleep apnea with a AHI at 33, SPO2 low at 81%.  Optimal CPAP control at 10 cm H2O.  Patient was started on nocturnal CPAP.  Since last visit patient says she is doing better, feels more rested , feels she benefits from CPAP . Does not think she is snoring, husband has not mentioned it.   CPAP download shows good compliance with 93% usage.  Daily average usage at 6 hours.  Patient is on CPAP 10 cm H2O.  AHI 4.7.  Minimum leaks.     Observations/Objective: PSG 07/22/18 >>AHI 33.8, SpO2 low 81%. CPAP 10 cm H2O >>AHI 0. Echo 04/30/13 >> mild LVH, EF 55 to 60%, grade 1 DD  Assessment and Plan: Severe sleep apnea excellent control on CPAP  Overweight-discussed healthy weight loss  Plan  Patient Instructions  Continue on CPAP at bedtime Keep up the good work Work on healthy weight Do not drive if  sleepy Follow-up in 4 to 6 months with Dr. Halford Chessman or [place] NP and As needed         Follow Up Instructions: Follow up in 4-6 months and As needed     I discussed the assessment and treatment plan with the patient. The patient was provided an opportunity to ask questions and all were answered. The patient agreed with the plan and demonstrated an understanding of the instructions.  The patient was advised to call back or seek an in-person evaluation if the symptoms worsen or if the condition fails to improve as anticipated.  I provided 22 minutes of non-face-to-face time during this encounter.

## 2019-01-25 ENCOUNTER — Encounter: Payer: Self-pay | Admitting: Oncology

## 2019-02-12 ENCOUNTER — Other Ambulatory Visit: Payer: Self-pay | Admitting: Endocrinology

## 2019-02-18 NOTE — Progress Notes (Addendum)
[facility] [place], [place], [place] [address] [phone]  Date:  02/19/2019   Name:  Brittney Vance   DOB:  10-08-1965   MRN:  [id number]  PCP:  [name], MD    Chief Complaint: Annual Exam   History of Present Illness:  Brittney Vance is a 53 y.o. very pleasant female patient who presents with the following:  Here today for a CPE History of breast cancer, HTN, hypothyroidism, sleep apnea  Last seen by myself about one year ago for CPE  She sees pulmonology for her OSA- she uses CPAP and does feel like this works well for her  Dr. [name] Hakim is managing her cancer history - she is seeing him tomorrow for update, they hope this will be her graduation visit and she will then be in survivorship clinic  Cardiologist is Hilty- last seen by him about 6 months ago for palpations and chest pain She had eval including an ETT which was low risk   Pap is UTD- Menopausal since before her breast cancer.   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  She notes this more at night or after dinner This has gone on for a few weeks No SOB or sx with exercise  Patient Active Problem List   Diagnosis Date Noted  . Goiter 02/06/2015  . Fibroid, uterine 10/03/2014  . Pain in joint, pelvic region and thigh 09/23/2014  . Depression 09/23/2014  . Genital herpes 03/15/2014  . Hypokalemia 10/03/2013  . Anemia associated with chemotherapy 09/17/2013  . Neutrophils decreased ([place]) 08/13/2013  . Malignant neoplasm of upper-outer quadrant of left breast in female, estrogen receptor negative ([place]) 04/26/2013  .  Hypothyroidism 01/28/2013  . Hypertension 01/25/2013  . Perimenopause 01/25/2013    Past Medical History:  Diagnosis Date  . Breast cancer ([place]) 2015   left, triple negative   . Depression 09/23/2014  . Fibroids   . Hypertension   . Hypothyroid   . Sleep apnea     Past Surgical History:  Procedure Laterality Date  . AXILLARY LYMPH NODE BIOPSY Left 10/30/2013   Procedure: AXILLARY LYMPH NODE BIOPSY;  Surgeon: [name], MD;  Location: [facility];  Service: General;  Laterality: Left;  . BREAST BIOPSY Left 04/24/2013  . BREAST RECONSTRUCTION WITH PLACEMENT OF TISSUE EXPANDER AND FLEX HD (ACELLULAR HYDRATED DERMIS) Bilateral 10/30/2013   Procedure: BILATERAL BREAST RECONSTRUCTION WITH PLACEMENT OF TISSUE EXPANDER AND FLEX HD (ACELLULAR HYDRATED DERMIS);  Surgeon: [name], MD;  Location: [facility];  Service: Plastics;  Laterality: Bilateral;  . MASTECTOMY    . PORT-A-CATH REMOVAL Right 10/30/2013   Procedure: REMOVAL PORT-A-CATH;  Surgeon: [name], MD;  Location: [facility];  Service: General;  Laterality: Right;  . PORTACATH PLACEMENT Right 04/27/2013   Procedure: ULTRASOUND GUIDED PORT INSERTION WITH FLUOROSCOPY;  Surgeon: [name], MD;  Location: [facility];  Service: General;  Laterality: Right;  . TOTAL MASTECTOMY Bilateral 10/30/2013   Procedure: TOTAL MASTECTOMY;  Surgeon: [name], MD;  Location: [facility];  Service: General;  Laterality: Bilateral;  . TUBAL LIGATION      Social History   Tobacco Use  . Smoking status: Never Smoker  . Smokeless tobacco: Never Used  Substance Use Topics  . Alcohol use: Not Currently  . Drug use: No    Family History  Problem Relation Age of Onset  . Hypertension Mother   . Diabetes Mother   . Alzheimer's disease Mother   . Stroke Mother   . Hypertension Father   . Diabetes Father   . Hypertension Brother   . Epilepsy Son 4       being worked up for autism  . Thyroid disease Neg Hx     No Known  Allergies  Medication list has been reviewed and updated.  Current Outpatient Medications on File Prior to Visit  Medication Sig Dispense Refill  . SYNTHROID 75 MCG tablet TAKE ONE TABLET BY MOUTH DAILY 30 tablet 2   No current facility-administered medications on file prior to visit.     Review of Systems:  As per HPI- otherwise negative. No PMB  Physical Examination: Vitals:   02/19/19 1401  BP: 132/80  Pulse: 96  Resp: 16  Temp: 97.8 F (36.6 C)  SpO2: 97%   Vitals:   02/19/19 1401  Weight: 149 lb (67.6 kg)  Height: 5\' 2"  (1.575 m)   Body mass index is 27.25 kg/m. Ideal Body Weight: Weight in (lb) to have BMI = 25: 136.4  GEN: WDWN, NAD, Non-toxic, A & O x 3, normal weight, looks well  HEENT: Atraumatic, Normocephalic. Neck supple. No masses, No LAD. Ears and Nose: No external deformity. CV: RRR, No M/G/R. No JVD. No thrill. No extra heart sounds. PULM: CTA B, no wheezes, crackles, rhonchi. No retractions. No resp. distress. No accessory muscle use. ABD: S, NT, ND, +BS. No rebound. No HSM. EXTR: No c/c/e NEURO Normal gait.  PSYCH: Normally interactive. Conversant. Not depressed or anxious appearing.  Calm demeanor.   EKG: NSR, when compared with 12/19 non specific T wave abnl is resolved  Assessment and Plan:   ICD-10-CM   1. Physical exam  Z00.00   2. Dyslipidemia  E78.5 Lipid panel  3. Screening for deficiency anemia  Z13.0 CBC  4. Screening for diabetes mellitus  Z13.1 Comprehensive metabolic panel    Hemoglobin A1c  5. Hypothyroidism due to acquired atrophy of thyroid  E03.4 TSH  6. Screening for HIV (human immunodeficiency virus)  Z11.4   7. Reflux esophagitis  K21.0 H. pylori breath test    omeprazole (PRILOSEC) 40 MG capsule  8. Chest discomfort  R07.89 EKG 12-Lead   Here today for a CPE Labs pending as above Non -specific chest discomfort Recent ETT is reassuring. EKG normal.  Sx are atypical- suspect GERD H pylori pending  Start on PPI She  will keep me updated  I ordered labs for her to have drawn at her upcoming oncology appt    Follow-up: No follow-ups on file.  Meds ordered this encounter  Medications  . omeprazole (PRILOSEC) 40 MG capsule    Sig: Take 1 capsule (40 mg total) by mouth daily.    Dispense:  30 capsule    Refill:  3   Orders Placed This Encounter  Procedures  . CBC  . Comprehensive metabolic panel  . Hemoglobin A1c  . Lipid panel  . TSH  . H. pylori breath test  . EKG 12-Lead    @SIGN @    Signed Lamar Blinks, MD 7/28 Received her breath  test- message to pt- negative   Results for orders placed or performed in visit on 02/19/19  H. pylori breath test  Result Value Ref Range   H. pylori Breath Test NOT DETECTED NOT DETECT   Received the rest of her labs- released to pt The 10-year ASCVD risk score Mikey Bussing DC Brooke Bonito., et al., 2013) is: 4%   Values used to calculate the score:     Age: 40 years     Sex: Female     Is Non-Hispanic African American: Yes     Diabetic: No     Tobacco smoker: No     Systolic Blood Pressure: 211 mmHg     Is BP treated: No     HDL Cholesterol: 66 mg/dL     Total Cholesterol: 236 mg/dL

## 2019-02-19 ENCOUNTER — Ambulatory Visit (INDEPENDENT_AMBULATORY_CARE_PROVIDER_SITE_OTHER): Payer: BC Managed Care – PPO | Admitting: Family Medicine

## 2019-02-19 ENCOUNTER — Other Ambulatory Visit: Payer: Self-pay | Admitting: *Deleted

## 2019-02-19 ENCOUNTER — Encounter: Payer: Self-pay | Admitting: Family Medicine

## 2019-02-19 ENCOUNTER — Other Ambulatory Visit: Payer: Self-pay

## 2019-02-19 VITALS — BP 132/80 | HR 96 | Temp 97.8°F | Resp 16 | Ht 62.0 in | Wt 149.0 lb

## 2019-02-19 DIAGNOSIS — D6481 Anemia due to antineoplastic chemotherapy: Secondary | ICD-10-CM

## 2019-02-19 DIAGNOSIS — E785 Hyperlipidemia, unspecified: Secondary | ICD-10-CM

## 2019-02-19 DIAGNOSIS — E876 Hypokalemia: Secondary | ICD-10-CM

## 2019-02-19 DIAGNOSIS — C50412 Malignant neoplasm of upper-outer quadrant of left female breast: Secondary | ICD-10-CM

## 2019-02-19 DIAGNOSIS — Z0000 Encounter for general adult medical examination without abnormal findings: Secondary | ICD-10-CM

## 2019-02-19 DIAGNOSIS — Z131 Encounter for screening for diabetes mellitus: Secondary | ICD-10-CM

## 2019-02-19 DIAGNOSIS — R0789 Other chest pain: Secondary | ICD-10-CM

## 2019-02-19 DIAGNOSIS — Z171 Estrogen receptor negative status [ER-]: Secondary | ICD-10-CM

## 2019-02-19 DIAGNOSIS — Z130 Encounter for screening for diseases of the blood and blood-forming organs and certain disorders involving the immune mechanism: Secondary | ICD-10-CM

## 2019-02-19 DIAGNOSIS — K2100 Gastro-esophageal reflux disease with esophagitis, without bleeding: Secondary | ICD-10-CM

## 2019-02-19 DIAGNOSIS — E038 Other specified hypothyroidism: Secondary | ICD-10-CM

## 2019-02-19 DIAGNOSIS — E034 Atrophy of thyroid (acquired): Secondary | ICD-10-CM

## 2019-02-19 DIAGNOSIS — Z114 Encounter for screening for human immunodeficiency virus [HIV]: Secondary | ICD-10-CM

## 2019-02-19 MED ORDER — OMEPRAZOLE 40 MG PO CPDR: 40.0000 mg | DELAYED_RELEASE_CAPSULE | Freq: Every day | ORAL | 3 refills | Status: DC

## 2019-02-19 NOTE — Progress Notes (Signed)
[employer]  Telephone:([phone]) [phone] Fax:(336) [phone]    ID: Newell Coral Rondon OB: February 12, 1966  MR#: [id number]  VOH#:[id number]  Patient Care Team: [name], MD as PCP - General (Family Medicine) Debara Pickett Nadean Corwin, MD as PCP - Cardiology (Cardiology) Magrinat, Virgie Dad, MD as Consulting Physician (Oncology) Eusebio Friendly, MD as Referring Physician (Internal Medicine) Guss Bunde, MD as Consulting Physician (Obstetrics and Gynecology) Chesley Mires, MD as Consulting Physician (Pulmonary Disease) OTHER MD:   CHIEF COMPLAINT: Left breast cancer, triple negative (s/p bilateral mastectomies)  CURRENT THERAPY:  observation   INTERVAL HISTORY: Arohi returns today for follow-up of her estrogen receptor negative breast cancer. She continues under observation. She has saline implants in place.  Since her last visit, she underwent pelvic ultrasound on 06/30/2018. This showed a 3.3 cm mass superior to the right aspect of the uterine fundus which is nonspecific. Given stability since prior CT on 09/27/2014, the mass is favored to be benign.  She presented to the ED on 07/09/2018 with chest pain. Chest x-ray performed at that time was negative.  She was tested for the flu and the coronavirus on 10/05/2018. All tests were negative.  She presented to the ED on 12/04/2018 with low back pain, nausea with vomiting, and left-sided abdominal pain. Abdominal x-ray performed at the time was negative but did note spinal curvature.   REVIEW OF SYSTEMS: Chavy reports she was diagnosed with sleep apnea in January and has been using a CPAP. She states she wakes up in the morning with back pain. She is walking around her neighborhood for exercise. 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 She reports her husband had to undergo hip surgery for a loose replacement. At home  with her is her husband and their two children. A detailed review of systems was otherwise entirely negative.    BREAST CANCER HISTORY:  from the original intake note:  Hollin had routine screening mammography 03/09/2013 showing heterogeneously dense breasts, and a possible asymmetry in the left breast. Diagnostic left mammography and ultrasonography at the [facility] Center 03/30/2013 found a lobulated mass in the left breast upper outer quadrant measuring 1.6 cm. This was palpable. Ultrasound showed a complex microlobulated hypoechoic mass measuring 1.8 cm. There was no left axillary lymphadenopathy noted.  Biopsy of the mass in question 04/06/2013 showed ([id number]) and invasive ductal carcinoma, grade 3, triple negative, with an MIB-1 of 65%.  Bilateral breast MRI 04/13/2013 found an irregular mass measuring 2.5 cm in the left breast with a few lymph nodes that showed moderate cortical thickness, the largest measuring 12 mm (level I).  The patient's subsequent history is as detailed below   PAST MEDICAL HISTORY: Past Medical History:  Diagnosis Date  . Breast cancer ([facility]) 2015   left, triple negative   . Depression 09/23/2014  . Fibroids   . Hypertension   . Hypothyroid   . Sleep apnea     PAST SURGICAL HISTORY: Past Surgical History:  Procedure Laterality Date  . AXILLARY LYMPH NODE BIOPSY Left 10/30/2013   Procedure: AXILLARY LYMPH NODE BIOPSY;  Surgeon: [name], MD;  Location: [facility];  Service: General;  Laterality: Left;  . BREAST BIOPSY Left 04/24/2013  . BREAST RECONSTRUCTION WITH PLACEMENT OF TISSUE EXPANDER AND FLEX HD (ACELLULAR HYDRATED DERMIS) Bilateral 10/30/2013   Procedure: BILATERAL BREAST RECONSTRUCTION WITH PLACEMENT OF TISSUE  EXPANDER AND FLEX HD (ACELLULAR HYDRATED DERMIS);  Surgeon: [name], MD;  Location: [facility];  Service: Plastics;  Laterality: Bilateral;  . MASTECTOMY    . PORT-A-CATH REMOVAL Right 10/30/2013   Procedure: REMOVAL PORT-A-CATH;   Surgeon: [name], MD;  Location: [facility];  Service: General;  Laterality: Right;  . PORTACATH PLACEMENT Right 04/27/2013   Procedure: ULTRASOUND GUIDED PORT INSERTION WITH FLUOROSCOPY;  Surgeon: [name], MD;  [facility];  Service: General;  Laterality: Right;  . TOTAL MASTECTOMY Bilateral 10/30/2013   Procedure: TOTAL MASTECTOMY;  Surgeon: [name], MD;  Location: [facility];  Service: General;  Laterality: Bilateral;  . TUBAL LIGATION      FAMILY HISTORY Family History  Problem Relation Age of Onset  . Hypertension Mother   . Diabetes Mother   . Alzheimer's disease Mother   . Stroke Mother   . Hypertension Father   . Diabetes Father   . Hypertension Brother   . Epilepsy Son 4       being worked up for autism  . Sleep apnea Son   . Thyroid disease Neg Hx   2 the patient's father died in his 62E from complications of diabetes. The patient's mother passed away in 03-22-18 from stroke. The patient had 2 brothers, and 2 sisters. There is no history of cancer in the family to her knowledge.   GYNECOLOGIC HISTORY:  (updated 10/03/2013)  Menarche age 89, first live birth age 55. The patient is GX P2. She is postmenopausal, but did have some perimenopausal bleeding, which was evaluated age 65 08/14/2012 with a transabdominal and transvaginal ultrasound of the pelvis which found a normal uterine myometrium and left ovary. The right ovary could not be visualized.   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 Her husband [name] also works for the [employer] system. He is currently being treated for prostate cancer. Their children are [name], [place], 10. Her mother passed away recently. The patient attends a [employer].     ADVANCED DIRECTIVES: Not in place   HEALTH MAINTENANCE:  Social History   Tobacco Use  . Smoking status: Never Smoker  . Smokeless tobacco: Never Used   Substance Use Topics  . Alcohol use: Not Currently  . Drug use: No     Colonoscopy:- June 2016  PAP: 02/24/2017  Bone density: Never  Lipid panel: 02/15/2018   No Known Allergies  Current Outpatient Medications  Medication Sig Dispense Refill  . omeprazole (PRILOSEC) 40 MG capsule Take 1 capsule (40 mg total) by mouth daily. 30 capsule 3  . SYNTHROID 75 MCG tablet TAKE ONE TABLET BY MOUTH DAILY 30 tablet 2   No current facility-administered medications for this visit.     OBJECTIVE:  Middle-aged African-American woman who appears stated age  15:   02/20/19 1329  BP: (!) 142/90  Pulse: 92  Resp: 18  Temp: 98.9 F (37.2 C)  SpO2: 100%  Body mass index is 27.12 kg/m.  ECOG: 1 Filed Weights   02/20/19 1329  Weight: 148 lb 4 oz (67.2 kg)   Sclerae unicteric, pupils round and equal Wearing a mask No cervical or supraclavicular adenopathy Lungs no rales or rhonchi Heart regular rate and rhythm Abd soft, nontender, positive bowel sounds MSK no focal spinal tenderness, no upper extremity lymphedema Neuro: nonfocal, well oriented, appropriate affect Breasts: Status post bilateral mastectomies with bilateral saline implants in place.  There is no evidence of chest wall recurrence.  Both axillae are benign.   LAB RESULTS:  Lab Results  Component Value Date   WBC 4.8 02/20/2019   NEUTROABS 1.7 02/20/2019   HGB 13.2 02/20/2019   HCT 39.3 02/20/2019   MCV 88.3 02/20/2019   PLT 258 02/20/2019      Chemistry      Component Value Date/Time   NA 137 07/09/2018 2354   NA 140 05/19/2017 1035   K 3.7 07/09/2018 2354   K 3.5 05/19/2017 1035   CL 103 07/09/2018 2354   CO2 28 07/09/2018 2354   CO2 29 05/19/2017 1035   BUN 16 07/09/2018 2354   BUN 7.0 05/19/2017 1035   CREATININE 0.69 07/09/2018 2354   CREATININE 0.72 05/18/2018 1117   CREATININE 0.7 05/19/2017 1035      Component Value Date/Time   CALCIUM 9.5 07/09/2018 2354   CALCIUM 9.9 05/19/2017 1035    ALKPHOS 98 05/18/2018 1117   ALKPHOS 91 05/19/2017 1035   AST 20 05/18/2018 1117   AST 21 05/19/2017 1035   ALT 18 05/18/2018 1117   ALT 16 05/19/2017 1035   BILITOT 0.3 05/18/2018 1117   BILITOT 0.39 05/19/2017 1035       STUDIES: No results found.  ASSESSMENT: 53 y.o. BRCA negative [place] woman   (1)  status post left breast upper outer quadrant biopsy 04/06/2013 for a clinical T2 N1, stage IIB invasive ductal carcinoma, high-grade, triple negative, with an MIB-1 of 65%  (2) left axillary lymph node biopsy 04/24/2013 negative for malignancy  (3)  treated in the neoadjuvant setting:   (a) completed 4 dose dense cycles of doxorubicin/ cyclophosphamide 06/11/2013,.  (b)  completed 12 weekly cycles of carboplatin/ paclitaxel 09/24/2013  (4)  status post bilateral mastectomies 10/30/2013, showing benign results on the right and a complete pathologic response in the left, including both sentinel lymph nodes clear  (5) status post bilateral breast implant reconstructive surgery  (6) pedunculated fibroid noted March 2016  (7) Right thyroid nodule noted July 2016  PLAN: Preslie is now more than 5 years out from definitive surgery for her breast cancer with no evidence of disease recurrence.  This is very favorable.  She is taking appropriate pandemic precautions.  There is data that cancer patients do not do well if infected and of course in addition she has pulmonary problems including sleep apnea.  I have written a letter for her suggesting that she work from home as much as possible and if she must go into work then she will need an appropriately safe environment  We reviewed the fact that some implants have been recalled because of concerns regarding lymphoma, but these are read silicone implants and she tells me what she has in place are saline.  At this point I feel comfortable releasing her to her primary care physicians follow-up.  All she will need in terms of breast  cancer follow-up is a yearly chest wall exam  She is interested in participating in our survivorship program and I have made her an appointment for next year  I will be glad to see Tedi at any point in the future if and when the need arises but as of now are making no further routine appointments for her with me here   Tyhesha Dutson, Virgie Dad, MD  02/20/19 2:15 PM Medical Oncology and Hematology [facility] [place], [place] [address] Tel. [phone]    Fax. [phone]  This document serves as a record of services personally performed by Chauncey Cruel, MD. It was created on his behalf by Wilburn Mylar, a trained medical scribe. The creation of this record is based on the scribe's personal observations and the provider's statements to them.   I, Lurline Del MD, have reviewed the above documentation for accuracy and completeness, and I agree with the above.

## 2019-02-19 NOTE — Patient Instructions (Addendum)
I ordered labs for you to have done at oncology later on- the orders should be in the computer for you Please consider getting the shingles vaccine at your convenience   Today we will have you do a breath test to look for H pylori, a bacteria which can inhabit the stomach and cause reflux.  If this is positive we will treat with antibiotics Otherwise let's have you try omeprazole (a PPI acid reducer) once a day for 2-4 weeks.  You can continue for longer if need be.  Please let me know how your symptoms respond.  If you have any exertional chest pain please contact Dr. Debara Pickett or myself right away  Take care!  Best of luck with the upcoming school year    Health Maintenance, Female Adopting a healthy lifestyle and getting preventive care are important in promoting health and wellness. Ask your health care provider about:  The right schedule for you to have regular tests and exams.  Things you can do on your own to prevent diseases and keep yourself healthy. What should I know about diet, weight, and exercise? Eat a healthy diet   Eat a diet that includes plenty of vegetables, fruits, low-fat dairy products, and lean protein.  Do not eat a lot of foods that are high in solid fats, added sugars, or sodium. Maintain a healthy weight Body mass index (BMI) is used to identify weight problems. It estimates body fat based on height and weight. Your health care provider can help determine your BMI and help you achieve or maintain a healthy weight. Get regular exercise Get regular exercise. This is one of the most important things you can do for your health. Most adults should:  Exercise for at least 150 minutes each week. The exercise should increase your heart rate and make you sweat (moderate-intensity exercise).  Do strengthening exercises at least twice a week. This is in addition to the moderate-intensity exercise.  Spend less time sitting. Even light physical activity can be  beneficial. Watch cholesterol and blood lipids Have your blood tested for lipids and cholesterol at 54 years of age, then have this test every 5 years. Have your cholesterol levels checked more often if:  Your lipid or cholesterol levels are high.  You are older than 54 years of age.  You are at high risk for heart disease. What should I know about cancer screening? Depending on your health history and family history, you may need to have cancer screening at various ages. This may include screening for:  Breast cancer.  Cervical cancer.  Colorectal cancer.  Skin cancer.  Lung cancer. What should I know about heart disease, diabetes, and high blood pressure? Blood pressure and heart disease  High blood pressure causes heart disease and increases the risk of stroke. This is more likely to develop in people who have high blood pressure readings, are of African descent, or are overweight.  Have your blood pressure checked: ? Every 3-5 years if you are 52-1 years of age. ? Every year if you are 65 years old or older. Diabetes Have regular diabetes screenings. This checks your fasting blood sugar level. Have the screening done:  Once every three years after age 43 if you are at a normal weight and have a low risk for diabetes.  More often and at a younger age if you are overweight or have a high risk for diabetes. What should I know about preventing infection? Hepatitis B If you have a higher  risk for hepatitis B, you should be screened for this virus. Talk with your health care provider to find out if you are at risk for hepatitis B infection. Hepatitis C Testing is recommended for:  Everyone born from [birth year] through 1966.  Anyone with known risk factors for hepatitis C. Sexually transmitted infections (STIs)  Get screened for STIs, including gonorrhea and chlamydia, if: ? You are sexually active and are younger than 54 years of age. ? You are older than 54 years of age and  your health care provider tells you that you are at risk for this type of infection. ? Your sexual activity has changed since you were last screened, and you are at increased risk for chlamydia or gonorrhea. Ask your health care provider if you are at risk.  Ask your health care provider about whether you are at high risk for HIV. Your health care provider may recommend a prescription medicine to help prevent HIV infection. If you choose to take medicine to prevent HIV, you should first get tested for HIV. You should then be tested every 3 months for as long as you are taking the medicine. Pregnancy  If you are about to stop having your period (premenopausal) and you may become pregnant, seek counseling before you get pregnant.  Take 400 to 800 micrograms (mcg) of folic acid every day if you become pregnant.  Ask for birth control (contraception) if you want to prevent pregnancy. Osteoporosis and menopause Osteoporosis is a disease in which the bones lose minerals and strength with aging. This can result in bone fractures. If you are 53 years old or older, or if you are at risk for osteoporosis and fractures, ask your health care provider if you should:  Be screened for bone loss.  Take a calcium or vitamin D supplement to lower your risk of fractures.  Be given hormone replacement therapy (HRT) to treat symptoms of menopause. Follow these instructions at home: Lifestyle  Do not use any products that contain nicotine or tobacco, such as cigarettes, e-cigarettes, and chewing tobacco. If you need help quitting, ask your health care provider.  Do not use street drugs.  Do not share needles.  Ask your health care provider for help if you need support or information about quitting drugs. Alcohol use  Do not drink alcohol if: ? Your health care provider tells you not to drink. ? You are pregnant, may be pregnant, or are planning to become pregnant.  If you drink alcohol: ? Limit how  much you use to 0-1 drink a day. ? Limit intake if you are breastfeeding.  Be aware of how much alcohol is in your drink. In the [place] one drink equals one 12 oz bottle of beer (355 mL), one 5 oz glass of wine (148 mL), or one 1 oz glass of hard liquor (44 mL). General instructions  Schedule regular health, dental, and eye exams.  Stay current with your vaccines.  Tell your health care provider if: ? You often feel depressed. ? You have ever been abused or do not feel safe at home. Summary  Adopting a healthy lifestyle and getting preventive care are important in promoting health and wellness.  Follow your health care provider's instructions about healthy diet, exercising, and getting tested or screened for diseases.  Follow your health care provider's instructions on monitoring your cholesterol and blood pressure. This information is not intended to replace advice given to you by your health care provider. Make sure you discuss  any questions you have with your health care provider. Document Released: 01/25/2011 Document Revised: 07/05/2018 Document Reviewed: 07/05/2018 Elsevier Patient Education  2020 [employer].

## 2019-02-20 ENCOUNTER — Encounter: Payer: Self-pay | Admitting: Oncology

## 2019-02-20 ENCOUNTER — Other Ambulatory Visit: Payer: Self-pay

## 2019-02-20 ENCOUNTER — Encounter: Payer: Self-pay | Admitting: Family Medicine

## 2019-02-20 ENCOUNTER — Inpatient Hospital Stay: Payer: BC Managed Care – PPO

## 2019-02-20 ENCOUNTER — Inpatient Hospital Stay: Payer: BC Managed Care – PPO | Attending: Oncology | Admitting: Oncology

## 2019-02-20 VITALS — BP 142/90 | HR 92 | Temp 98.9°F | Resp 18 | Ht 62.0 in | Wt 148.2 lb

## 2019-02-20 DIAGNOSIS — Z853 Personal history of malignant neoplasm of breast: Secondary | ICD-10-CM

## 2019-02-20 DIAGNOSIS — Z79899 Other long term (current) drug therapy: Secondary | ICD-10-CM | POA: Diagnosis not present

## 2019-02-20 DIAGNOSIS — E041 Nontoxic single thyroid nodule: Secondary | ICD-10-CM | POA: Insufficient documentation

## 2019-02-20 DIAGNOSIS — Z8249 Family history of ischemic heart disease and other diseases of the circulatory system: Secondary | ICD-10-CM | POA: Insufficient documentation

## 2019-02-20 DIAGNOSIS — E038 Other specified hypothyroidism: Secondary | ICD-10-CM

## 2019-02-20 DIAGNOSIS — C50412 Malignant neoplasm of upper-outer quadrant of left female breast: Secondary | ICD-10-CM

## 2019-02-20 DIAGNOSIS — E039 Hypothyroidism, unspecified: Secondary | ICD-10-CM | POA: Diagnosis not present

## 2019-02-20 DIAGNOSIS — Z171 Estrogen receptor negative status [ER-]: Secondary | ICD-10-CM

## 2019-02-20 DIAGNOSIS — T451X5A Adverse effect of antineoplastic and immunosuppressive drugs, initial encounter: Secondary | ICD-10-CM

## 2019-02-20 DIAGNOSIS — I10 Essential (primary) hypertension: Secondary | ICD-10-CM | POA: Insufficient documentation

## 2019-02-20 DIAGNOSIS — D6481 Anemia due to antineoplastic chemotherapy: Secondary | ICD-10-CM

## 2019-02-20 DIAGNOSIS — E876 Hypokalemia: Secondary | ICD-10-CM

## 2019-02-20 LAB — CMP (CANCER CENTER ONLY)
ALT: 21 U/L (ref 0–44)
AST: 22 U/L (ref 15–41)
Albumin: 4.1 g/dL (ref 3.5–5.0)
Alkaline Phosphatase: 106 U/L (ref 38–126)
Anion gap: 8 (ref 5–15)
BUN: 9 mg/dL (ref 6–20)
CO2: 29 mmol/L (ref 22–32)
Calcium: 9.8 mg/dL (ref 8.9–10.3)
Chloride: 104 mmol/L (ref 98–111)
Creatinine: 0.75 mg/dL (ref 0.44–1.00)
GFR, Est AFR Am: >60 mL/min (ref >60)
GFR, Estimated: >60 mL/min (ref >60)
Glucose, Bld: 98 mg/dL (ref 70–99)
Potassium: 3.7 mmol/L (ref 3.5–5.1)
Sodium: 141 mmol/L (ref 135–145)
Total Bilirubin: 0.3 mg/dL (ref 0.3–1.2)
Total Protein: 7.9 g/dL (ref 6.5–8.1)

## 2019-02-20 LAB — LIPID PANEL
Cholesterol: 236 mg/dL — ABNORMAL HIGH (ref 0–200)
HDL: 66 mg/dL (ref >40)
LDL Cholesterol: 154 mg/dL — ABNORMAL HIGH (ref 0–99)
Total CHOL/HDL Ratio: 3.6 RATIO
Triglycerides: 80 mg/dL (ref <150)
VLDL: 16 mg/dL (ref 0–40)

## 2019-02-20 LAB — CBC WITH DIFFERENTIAL (CANCER CENTER ONLY)
Abs Immature Granulocytes: 0.01 10*3/uL (ref 0.00–0.07)
Basophils Absolute: 0 10*3/uL (ref 0.0–0.1)
Basophils Relative: 1 %
Eosinophils Absolute: 0.1 10*3/uL (ref 0.0–0.5)
Eosinophils Relative: 3 %
HCT: 39.3 % (ref 36.0–46.0)
Hemoglobin: 13.2 g/dL (ref 12.0–15.0)
Immature Granulocytes: 0 %
Lymphocytes Relative: 51 %
Lymphs Abs: 2.5 10*3/uL (ref 0.7–4.0)
MCH: 29.7 pg (ref 26.0–34.0)
MCHC: 33.6 g/dL (ref 30.0–36.0)
MCV: 88.3 fL (ref 80.0–100.0)
Monocytes Absolute: 0.4 10*3/uL (ref 0.1–1.0)
Monocytes Relative: 9 %
Neutro Abs: 1.7 10*3/uL (ref 1.7–7.7)
Neutrophils Relative %: 36 %
Platelet Count: 258 10*3/uL (ref 150–400)
RBC: 4.45 MIL/uL (ref 3.87–5.11)
RDW: 12.5 % (ref 11.5–15.5)
WBC Count: 4.8 10*3/uL (ref 4.0–10.5)
nRBC: 0 % (ref 0.0–0.2)

## 2019-02-20 LAB — H. PYLORI BREATH TEST: H. pylori Breath Test: NOT DETECTED

## 2019-02-20 LAB — TSH: TSH: 0.621 u[IU]/mL (ref 0.308–3.960)

## 2019-02-21 ENCOUNTER — Telehealth: Payer: Self-pay | Admitting: Adult Health

## 2019-02-21 NOTE — Telephone Encounter (Signed)
I talk with patient regarding schedule  

## 2019-02-23 ENCOUNTER — Ambulatory Visit: Payer: BC Managed Care – PPO | Admitting: Endocrinology

## 2019-02-23 ENCOUNTER — Ambulatory Visit: Payer: BC Managed Care – PPO | Admitting: Pulmonary Disease

## 2019-02-23 ENCOUNTER — Ambulatory Visit: Payer: BC Managed Care – PPO | Admitting: Adult Health

## 2019-02-23 NOTE — Progress Notes (Signed)
Reviewed and agree with assessment/plan.   Kamee Bobst, MD [facility] 07/21/2016, 12:24 PM Pager:  [phone]  

## 2019-03-01 ENCOUNTER — Encounter: Payer: Self-pay | Admitting: Pulmonary Disease

## 2019-03-01 ENCOUNTER — Ambulatory Visit: Payer: BC Managed Care – PPO | Admitting: Pulmonary Disease

## 2019-03-01 ENCOUNTER — Other Ambulatory Visit: Payer: Self-pay

## 2019-03-01 VITALS — BP 130/72 | HR 103 | Temp 98.2°F | Ht 62.0 in | Wt 149.6 lb

## 2019-03-01 DIAGNOSIS — G4733 Obstructive sleep apnea (adult) (pediatric): Secondary | ICD-10-CM | POA: Diagnosis not present

## 2019-03-01 DIAGNOSIS — Z7189 Other specified counseling: Secondary | ICD-10-CM | POA: Diagnosis not present

## 2019-03-01 NOTE — Progress Notes (Signed)
[facility]  Chief Complaint  Patient presents with  . Follow-up    cpap/osa, denies problems, DME [facility]    Constitutional:  BP 130/72 (BP Location: Right Arm, Cuff Size: Normal)   Pulse (!) 103   Temp 98.2 F (36.8 C) (Oral)   Ht 5\' 2"  (1.575 m)   Wt 149 lb 9.6 oz (67.9 kg)   LMP 03/10/2013 (LMP Unknown)   SpO2 99%   BMI 27.36 kg/m   Past Medical History:  Breast cancer, Depression, Fibroids, HTN, Hypothyroidism  Brief Summary:  Brittney Vance is a 53 y.o. female with obstructive sleep apnea.  She has been doing well with CPAP.  Uses hybrid mask.  Not having sinus congestion, sore throat, dry mouth, or aerophagia.  Sleeping better.  More alertness during the day.  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  She is concerned about risk of COVID exposure if she has to go back to school.  Physical Exam:   Appearance - well kempt   ENMT - no sinus tenderness, no nasal discharge, no oral exudate, MP 4, enlarged tongue  Neck - no masses, trachea midline, no thyromegaly, no elevation in JVP  Respiratory - normal appearance of chest wall, normal respiratory effort w/o accessory muscle use, no dullness on percussion, no wheezing or rales  CV - s1s2 regular rate and rhythm, no murmurs, no peripheral edema, radial pulses symmetric  GI - soft, non tender  Lymph - no adenopathy noted in neck and axillary areas  MSK - normal gait  Ext - no cyanosis, clubbing, or joint inflammation noted  Skin - no rashes, lesions, or ulcers  Neuro - normal strength, oriented x 3  Psych - normal mood and affect    Assessment/Plan:   Obstructive sleep apnea. - she is compliant with CPAP and reports benefit from therapy - AHI still slightly elevated on download - will change from CPAP 10 to 11 cm H2O  Advice given about COVID-19 infection. - she would be high risk for significant complications if she were to contract COVID-19 - discussed techniques  to minimize her risk of exposure and symptoms to monitor for if she did get exposure - will write a letter for her work place; would be best if she could work remotely    Patient Instructions  Will have your CPAP setting changed to 11 cm water pressure  Follow up in 1 year   Chesley Mires, MD [place] Pager: [phone] 03/01/2019, 11:52 AM  Flow Sheet    Sleep tests:  PSG 07/22/18 >> AHI 33.8, SpO2 low 81%.  CPAP 10 cm H2O >> AHI 0. CPAP 01/30/19 to 02/28/19 >> used on 29 of 30 nights with average 6 hrs 19 min.  Average AHI 6 with CPAP 10 cm H2O  Cardiac tests:  Echo 04/30/13 >> mild LVH, EF 55 to 60%, grade 1 DD  Medications:   Allergies as of 03/01/2019   No Known Allergies     Medication List       Accurate as of March 01, 2019 11:52 AM. If you have any questions, ask your nurse or doctor.        STOP taking these medications   omeprazole 40 MG capsule Commonly known as: PRILOSEC Stopped by: Chesley Mires, MD     TAKE these medications   Synthroid 75 MCG tablet Generic drug: levothyroxine TAKE ONE TABLET BY MOUTH DAILY       Past Surgical History:  She  has a past surgical history that includes Tubal ligation; Portacath placement (Right, 04/27/2013); Breast biopsy (Left, 04/24/2013); Total mastectomy (Bilateral, 10/30/2013); Axillary lymph node biopsy (Left, 10/30/2013); Port-a-cath removal (Right, 10/30/2013); Breast reconstruction with placement of tissue expander and flex hd (acellular hydrated dermis) (Bilateral, 10/30/2013); and Mastectomy.  Family History:  Her family history includes Alzheimer's disease in her mother; Diabetes in her father and mother; Epilepsy (age of onset: 79) in her son; Hypertension in her brother, father, and mother; Sleep apnea in her son; Stroke in her mother.  Social History:  She  reports that she has never smoked. She has never used smokeless tobacco. She reports previous alcohol use. She reports that she does not  use drugs.

## 2019-03-01 NOTE — Patient Instructions (Signed)
Will have your CPAP setting changed to 11 cm water pressure  Follow up in 1 year

## 2019-03-02 ENCOUNTER — Telehealth: Payer: Self-pay | Admitting: Pulmonary Disease

## 2019-03-02 NOTE — Telephone Encounter (Signed)
Returned call to patient. Patient states she received the letter via mychart and doesn't need the signed copy.  She forwarded the letter from her mychart to her employer.  Nothing further needed at this time.

## 2019-03-02 NOTE — Telephone Encounter (Signed)
Please let her know I have completed letter for her work place.  It is signed and in my office.

## 2019-05-15 ENCOUNTER — Encounter: Payer: Self-pay | Admitting: Oncology

## 2019-05-15 NOTE — Telephone Encounter (Signed)
Pt asking if Tammy Parrett would be able to add the diagnoses on the letter.  Please advise.

## 2019-05-15 NOTE — Telephone Encounter (Signed)
Dr. Halford Chessman, Patient sent you this message this morning,05/15/19.  Patient is aware you are not in the office today, but will return 05/16/19 for clinic.  Patient is fine to wait until you have returned to clinic for your discission.   Good morning, Dr. Halford Chessman you prepared a letter for my employer regarding my medical condition being at increased risk of illness from the virus that causes COVID-19.   However human resources  would like my medical condition diagnosis included in the letter you provided. Dr.Sood could you please update the letter in mychart at your earliest convenience?  Have a nice day,  [facility] routed to Dr. Halford Chessman

## 2019-05-15 NOTE — Telephone Encounter (Signed)
Pt states the letter that Dr. Halford Chessman typed needs to have diagnoses added.  Please advise.

## 2019-05-16 ENCOUNTER — Other Ambulatory Visit: Payer: Self-pay | Admitting: Endocrinology

## 2019-05-17 NOTE — Telephone Encounter (Signed)
Pt sent the following email, please advise if okay to provide her with an updated letter to include her dx thanks  [place], Newell Coral sent to [facility]  Phone Number: [phone]        Hello Dr Halford Chessman,  I am following up on previous request to receive an updated letter for Human Resources which needs to include my diagnosis.  Please update the letter from August 2020 with diagnosis that puts me at higher risk for severe illness from COVID 19.  It is okay to place letter in [place] thank you have a great day.  [phone]

## 2019-05-18 ENCOUNTER — Encounter: Payer: Self-pay | Admitting: Pulmonary Disease

## 2019-05-21 NOTE — Telephone Encounter (Signed)
Message send to patient per Dr. Halford Chessman. Nothing further needed at this time.

## 2019-06-12 NOTE — Telephone Encounter (Signed)
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  Dr Mayer Camel of [place] was his surgeon in July and on October 2nd. 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   Any one of those positives cases  could have easily impacted me if I were exposed to the infected person.     Dr Halford Chessman given my medical history can you provide a letter placing me under "medical quarantine "until a remote position becomes available ? I certainly appreciate your immediate attention to this matter.  Sincerely, Brittney Vance"   Reviewed patient's chart. It looks like a similar letter was written on 01/01/2019. Dr. Halford Chessman, please advise if you are ok with this type of letter. Thanks!

## 2019-06-12 NOTE — Telephone Encounter (Signed)
Okay to provide updated letter.

## 2019-07-09 ENCOUNTER — Ambulatory Visit (INDEPENDENT_AMBULATORY_CARE_PROVIDER_SITE_OTHER): Payer: BC Managed Care – PPO | Admitting: Obstetrics & Gynecology

## 2019-07-09 ENCOUNTER — Other Ambulatory Visit: Payer: Self-pay

## 2019-07-09 ENCOUNTER — Encounter: Payer: Self-pay | Admitting: Obstetrics & Gynecology

## 2019-07-09 VITALS — BP 156/101 | HR 84 | Ht 63.0 in | Wt 154.0 lb

## 2019-07-09 DIAGNOSIS — Z01419 Encounter for gynecological examination (general) (routine) without abnormal findings: Secondary | ICD-10-CM

## 2019-07-09 DIAGNOSIS — Z23 Encounter for immunization: Secondary | ICD-10-CM

## 2019-07-09 NOTE — Progress Notes (Signed)
Last pap- 02/24/17- negative

## 2019-07-10 NOTE — Progress Notes (Signed)
Subjective:     Brittney Vance is a 53 y.o. female here for a routine exam.  Current complaints: none--in remission from breast cancer.  No PMB.  Wants flu shot.     Gynecologic History Patient's last menstrual period was 03/10/2013 (lmp unknown). Contraception: post menopausal status Last Pap: 2018. Results were: normal Last mammogram: s/p bilateral mastectomy with implants.  Obstetric History OB History  Gravida Para Term Preterm AB Living  2 2 2     2   SAB TAB Ectopic Multiple Live Births               # Outcome Date GA Lbr Len/2nd Weight Sex Delivery Anes PTL Lv  2 Term           1 Term            The following portions of the patient's history were reviewed and updated as appropriate: allergies, current medications, past family history, past medical history, past social history, past surgical history and problem list.  Review of Systems Pertinent items noted in HPI and remainder of comprehensive ROS otherwise negative.    Objective:      Vitals:   07/09/19 1556  BP: (!) 156/101  Pulse: 84  Weight: 154 lb (69.9 kg)  Height: 5\' 3"  (1.6 m)   Vitals:  WNL General appearance: alert, cooperative and no distress  HEENT: Normocephalic, without obvious abnormality, atraumatic Eyes: negative Throat: lips, mucosa, and tongue normal; teeth and gums normal  Respiratory: Clear to auscultation bilaterally  CV: Regular rate and rhythm  Breasts:  S/p reconstruction, no masses, implants in place  GI: Soft, non-tender; bowel sounds normal; no masses,  no organomegaly  GU: External Genitalia:  Tanner V, no lesion Urethra:  No prolapse   Vagina: Pink, normal rugae, no blood or discharge  Cervix: No CMT, no lesion  Uterus:  Enlarged, nontender (c/w [place] from 2019)  Adnexa: Normal, no masses, non tender  Musculoskeletal: No edema, redness or tenderness in the calves or thighs  Skin: No lesions or rash  Lymphatic: Axillary adenopathy: none     Psychiatric: Normal mood and behavior         Assessment:    Healthy female exam.    Plan:   Clinical and self breast exams Flu shot F/U with PCP for other health care maintenance Calcium and Vit D for bone health Pt willing to take Covid vaccine when available BP elevated.  Will ask her to take at home and report back value.

## 2019-07-12 ENCOUNTER — Ambulatory Visit: Payer: BC Managed Care – PPO | Admitting: Endocrinology

## 2019-07-13 ENCOUNTER — Ambulatory Visit: Payer: BC Managed Care – PPO | Admitting: Endocrinology

## 2019-07-14 ENCOUNTER — Other Ambulatory Visit: Payer: Self-pay | Admitting: Endocrinology

## 2019-07-18 ENCOUNTER — Encounter: Payer: Self-pay | Admitting: Family Medicine

## 2019-07-26 ENCOUNTER — Encounter

## 2019-07-30 ENCOUNTER — Ambulatory Visit (INDEPENDENT_AMBULATORY_CARE_PROVIDER_SITE_OTHER): Payer: BC Managed Care – PPO | Admitting: Endocrinology

## 2019-07-30 ENCOUNTER — Encounter: Payer: Self-pay | Admitting: Endocrinology

## 2019-07-30 ENCOUNTER — Other Ambulatory Visit: Payer: Self-pay

## 2019-07-30 ENCOUNTER — Ambulatory Visit: Payer: BC Managed Care – PPO | Admitting: Endocrinology

## 2019-07-30 VITALS — BP 148/88 | HR 89 | Temp 98.3°F | Wt 154.6 lb

## 2019-07-30 DIAGNOSIS — E038 Other specified hypothyroidism: Secondary | ICD-10-CM

## 2019-07-30 MED ORDER — LEVOTHYROXINE SODIUM 75 MCG PO TABS: 75.0000 ug | ORAL_TABLET | Freq: Every day | ORAL | 3 refills | Status: DC

## 2019-07-30 NOTE — Patient Instructions (Addendum)
Your blood pressure is high today.  Please see your primary care provider soon, to have it rechecked Please continue the same medication.   Please come back for a follow-up appointment in 1 year.

## 2019-07-30 NOTE — Progress Notes (Signed)
Subjective:    Patient ID: Brittney Vance, female    DOB: March 02, 1966, 54 y.o.   MRN: [id number]  HPI Pt returns for f/u of hypothyroidism (dx'ed 2006, in the postpartum state; she has been on synthroid since then; [place] in 2016 showed single right-sided nodule, 9 mm x 7 mm x 9 mm; f/u US is 2018 was unchanged).  pt states she feels well in general.  She takes synthroid as rx'ed.  She does not notice the goiter.  She will have labs with Dr Lorelei Pont soon. Past Medical History:  Diagnosis Date  . Breast cancer ([place]) 2015   left, triple negative   . Depression 09/23/2014  . Fibroids   . Hypertension   . Hypothyroid   . Sleep apnea     Past Surgical History:  Procedure Laterality Date  . AXILLARY LYMPH NODE BIOPSY Left 10/30/2013   Procedure: AXILLARY LYMPH NODE BIOPSY;  Surgeon: [name], MD;  Location: [facility];  Service: General;  Laterality: Left;  . BREAST BIOPSY Left 04/24/2013  . BREAST RECONSTRUCTION WITH PLACEMENT OF TISSUE EXPANDER AND FLEX HD (ACELLULAR HYDRATED DERMIS) Bilateral 10/30/2013   Procedure: BILATERAL BREAST RECONSTRUCTION WITH PLACEMENT OF TISSUE EXPANDER AND FLEX HD (ACELLULAR HYDRATED DERMIS);  Surgeon: [name], MD;  Location: [facility];  Service: Plastics;  Laterality: Bilateral;  . MASTECTOMY    . PORT-A-CATH REMOVAL Right 10/30/2013   Procedure: REMOVAL PORT-A-CATH;  Surgeon: [name], MD;  Location: [facility];  Service: General;  Laterality: Right;  . PORTACATH PLACEMENT Right 04/27/2013   Procedure: ULTRASOUND GUIDED PORT INSERTION WITH FLUOROSCOPY;  Surgeon: [name], MD;  Location: [facility];  Service: General;  Laterality: Right;  . TOTAL MASTECTOMY Bilateral 10/30/2013   Procedure: TOTAL MASTECTOMY;  Surgeon: [name], MD;  Location: [facility];  Service: General;  Laterality: Bilateral;  . TUBAL LIGATION      Social History   Socioeconomic History  . Marital status: Married    Spouse name: [name]   . Number of children: 2  . Years of  education: Masters   . Highest education level: Not on file  Occupational History  . Occupation: Chiropodist: UNEMPLOYED    Comment: [employer]  Tobacco Use  . Smoking status: Never Smoker  . Smokeless tobacco: Never Used  Substance and Sexual Activity  . Alcohol use: Not Currently  . Drug use: No  . Sexual activity: Yes    Partners: Male    Birth control/protection: Post-menopausal    Comment: menarche age 34, first birth age 25, P2, post menopausal  Other Topics Concern  . Not on file  Social History Narrative   1-2 caffeine drinks per day. No regular exercise.   Social Determinants of Health   Financial Resource Strain:   . Difficulty of Paying Living Expenses: Not on file  Food Insecurity:   . Worried About Charity fundraiser in the Last Year: Not on file  . Ran Out of Food in the Last Year: Not on file  Transportation Needs:   . Lack of Transportation (Medical): Not on file  . Lack of Transportation (Non-Medical): Not on file  Physical Activity:   . Days of Exercise per Week: Not on file  . Minutes of Exercise per Session: Not on file  Stress:   . Feeling of Stress : Not on file  Social Connections:   . Frequency of Communication with Friends and Family: Not on file  .  Frequency of Social Gatherings with Friends and Family: Not on file  . Attends Religious Services: Not on file  . Active Member of Clubs or Organizations: Not on file  . Attends Archivist Meetings: Not on file  . Marital Status: Not on file  Intimate Partner Violence:   . Fear of Current or Ex-Partner: Not on file  . Emotionally Abused: Not on file  . Physically Abused: Not on file  . Sexually Abused: Not on file    No current outpatient medications on file prior to visit.   No current facility-administered medications on file prior to visit.    No Known Allergies  Family History  Problem Relation Age of Onset  . Hypertension Mother   . Diabetes Mother   .  Alzheimer's disease Mother   . Stroke Mother   . Hypertension Father   . Diabetes Father   . Hypertension Brother   . Epilepsy Son 4       being worked up for autism  . Sleep apnea Son   . Thyroid disease Neg Hx     BP (!) 148/88 (BP Location: Right Arm, Patient Position: Sitting, Cuff Size: Normal)   Pulse 89   Temp 98.3 F (36.8 C)   Wt 154 lb 9.6 oz (70.1 kg)   LMP 03/10/2013 (LMP Unknown)   SpO2 95%   BMI 27.39 kg/m    Review of Systems Denies neck pain.      Objective:   Physical Exam VITAL SIGNS:  See vs page GENERAL: no distress NECK: There is no palpable thyroid enlargement.  No thyroid nodule is palpable.  No palpable lymphadenopathy at the anterior neck.         Assessment & Plan:  Thyroid nodule: she declines f/u US. HTN: is noted today.  Hypothyroidism: she will have recheck soon.   Patient Instructions  Your blood pressure is high today.  Please see your primary care provider soon, to have it rechecked Please continue the same medication.   Please come back for a follow-up appointment in 1 year.

## 2019-08-09 NOTE — Progress Notes (Addendum)
[facility] [place], [place], [place] [address] [phone]  Date:  08/13/2019   Name:  Brittney Vance   DOB:  Jan 25, 1966   MRN:  [id number]  PCP:  [name], MD    Chief Complaint: Follow-up (lab work, check up)   History of Present Illness:  Brittney Vance is a 54 y.o. very pleasant female patient who presents with the following:  Here today for a follow-up visit and labs History of hypertension, hypothyroidism, breast cancer 2014, depression, sleep apnea  Last seen by myself for a physical in July  Her oncologist is Dr. [name] Hakim Cardiologist is Dr. Debara Pickett She sees pulmonology for management of sleep apnea/ OSA  She has a daughter in high school, and an 50 year-old son who does have autism-both of her kids are in home school right now due to pandemic  She is now 5 years out from definitive surgery for breast cancer, no evidence of recurrence per oncology She saw her endocrinologist, Dr. Loanne Drilling just recently for concern of thyroid nodule.  He noted that her blood pressure was high, asked her to come and see me for follow-up  Patient notes that she checks her blood pressure at home regularly, it is always under excellent control lower than 120/80  Currently taking levothyroxine 75 only  Lab Results  Component Value Date   HGBA1C 5.8 02/15/2018   Lab Results  Component Value Date   TSH 0.621 02/20/2019   Most recent labs in July Today needs routine labs, HIV screening Can suggest Shingrix  She thinks that she hurt her back a few days ago- she is using some topical cream and heat.  Yesterday she had to sleep in her chair due to the pain and could not use her CPAP She is not aware of any particular injury, but she is doing more lifting and carrying at work recently  Her back pain started about 2 days - in her bilateral lower back, no radiation to her legs, no numbness or weakness  No urinary sx She  did have a similar issue with her back several years ago - maybe a decade ago Patient Active Problem List   Diagnosis Date Noted  . Goiter 02/06/2015  . Fibroid, uterine 10/03/2014  . Pain in joint, pelvic region and thigh 09/23/2014  . Depression 09/23/2014  . Genital herpes 03/15/2014  . Hypokalemia 10/03/2013  . Anemia associated with chemotherapy 09/17/2013  . Neutrophils decreased ([facility]) 08/13/2013  . Malignant neoplasm of upper-outer quadrant of left breast in female, estrogen receptor negative ([facility]) 04/26/2013  . Hypothyroidism 01/28/2013  . Hypertension 01/25/2013  . Perimenopause 01/25/2013    Past Medical History:  Diagnosis Date  . Breast cancer ([facility]) 2015   left, triple negative   . Depression 09/23/2014  . Fibroids   . Hypertension   . Hypothyroid   . Sleep apnea     Past Surgical History:  Procedure Laterality Date  . AXILLARY LYMPH NODE BIOPSY Left 10/30/2013   Procedure: AXILLARY LYMPH NODE BIOPSY;  Surgeon: [name], MD;  Location: [facility];  Service: General;  Laterality: Left;  . BREAST BIOPSY Left 04/24/2013  . BREAST RECONSTRUCTION WITH PLACEMENT OF TISSUE EXPANDER AND FLEX HD (ACELLULAR HYDRATED DERMIS) Bilateral 10/30/2013   Procedure: BILATERAL BREAST RECONSTRUCTION WITH PLACEMENT OF TISSUE EXPANDER AND FLEX HD (ACELLULAR HYDRATED DERMIS);  Surgeon: [name], MD;  Location: [facility];  Service: Plastics;  Laterality: Bilateral;  . MASTECTOMY    . PORT-A-CATH REMOVAL Right 10/30/2013   Procedure: REMOVAL PORT-A-CATH;  Surgeon: [name], MD;  Location: [facility];  Service: General;  Laterality: Right;  . PORTACATH PLACEMENT Right 04/27/2013   Procedure: ULTRASOUND GUIDED PORT INSERTION WITH FLUOROSCOPY;  Surgeon: [name], MD;  Location: [facility];  Service: General;  Laterality: Right;  . TOTAL MASTECTOMY Bilateral 10/30/2013   Procedure: TOTAL MASTECTOMY;  Surgeon: [name], MD;  Location: [facility];  Service: General;  Laterality: Bilateral;   . TUBAL LIGATION      Social History   Tobacco Use  . Smoking status: Never Smoker  . Smokeless tobacco: Never Used  Substance Use Topics  . Alcohol use: Not Currently  . Drug use: No    Family History  Problem Relation Age of Onset  . Hypertension Mother   . Diabetes Mother   . Alzheimer's disease Mother   . Stroke Mother   . Hypertension Father   . Diabetes Father   . Hypertension Brother   . Epilepsy Son 4       being worked up for autism  . Sleep apnea Son   . Thyroid disease Neg Hx     No Known Allergies  Medication list has been reviewed and updated.  Current Outpatient Medications on File Prior to Visit  Medication Sig Dispense Refill  . levothyroxine (SYNTHROID) 75 MCG tablet Take 1 tablet (75 mcg total) by mouth daily. 90 tablet 3   No current facility-administered medications on file prior to visit.    Review of Systems:  As per HPI- otherwise negative.   Physical Examination: Vitals:   08/13/19 0924 08/13/19 0935  BP: (!) 150/82 138/88  Pulse: (!) 101   Resp: 16   Temp: (!) 97.5 F (36.4 C)   SpO2: 98%    Vitals:   08/13/19 0924  Weight: 144 lb (65.3 kg)  Height: 5\' 3"  (1.6 m)   Body mass index is 25.51 kg/m. Ideal Body Weight: Weight in (lb) to have BMI = 25: 140.8  GEN: WDWN, NAD, Non-toxic, A & O x 3, minimal overweight, looks well HEENT: Atraumatic, Normocephalic. Neck supple. No masses, No LAD. Ears and Nose: No external deformity. CV: RRR, No M/G/R. No JVD. No thrill. No extra heart sounds. PULM: CTA B, no wheezes, crackles, rhonchi. No retractions. No resp. distress. No accessory muscle use. ABD: S, NT, ND. No rebound. No HSM. EXTR: No c/c/e NEURO Normal gait.  PSYCH: Normally interactive. Conversant. Not depressed or anxious appearing.  Calm demeanor.  She notes mild tenderness over her bilateral lower back, just above the gluteal cleft.  No rash or skin changes noted Negative straight leg raise bilaterally, normal  sensation, strength, DTR both lower extremities   BP Readings from Last 3 Encounters:  08/13/19 138/88  07/30/19 (!) 148/88  07/09/19 (!) 156/101     Assessment and Plan: Dyslipidemia - Plan: Lipid panel  Screening for deficiency anemia - Plan: CBC  Hypothyroidism due to acquired atrophy of thyroid - Plan: TSH  Screening for diabetes mellitus - Plan: Comprehensive metabolic panel, Hemoglobin A1c  Acute bilateral low back pain without sciatica - Plan: meloxicam (MOBIC) 7.5 MG tablet, cyclobenzaprine (FLEXERIL) 10 MG tablet  Following up today for an in person visit Labs pending as above Check thyroid today Blood pressure looks better on recheck-she will continue to monitor at home, will let me know if she begins running higher than goal Day prescriptions for  her back pain, Mobic and Flexeril Physical exam is scheduled for the summer  Will plan further follow- up pending labs.  Moderate medical decision making today This visit occurred during the SARS-CoV-2 public health emergency.  Safety protocols were in place, including screening questions prior to the visit, additional usage of staff PPE, and extensive cleaning of exam room while observing appropriate contact time as indicated for disinfecting solutions.    Signed Lamar Blinks, MD  Received her labs, message to patient She is taking 75 mcg of Synthroid-we will adjust her dose We will have her alternate 75 and 50 mcg, recheck TSH 1 month  The 10-year ASCVD risk score Mikey Bussing DC Brooke Bonito., et al., 2013) is: 4.4%   Values used to calculate the score:     Age: 34 years     Sex: Female     Is Non-Hispanic African American: Yes     Diabetic: No     Tobacco smoker: No     Systolic Blood Pressure: 0000000 mmHg     Is BP treated: No     HDL Cholesterol: 55.2 mg/dL     Total Cholesterol: 233 mg/dL   Results for orders placed or performed in visit on 08/13/19  CBC  Result Value Ref Range   WBC 6.9 4.0 - 10.5 K/uL   RBC 4.57  3.87 - 5.11 Mil/uL   Platelets 295.0 150.0 - 400.0 K/uL   Hemoglobin 13.8 12.0 - 15.0 g/dL   HCT 41.0 36.0 - 46.0 %   MCV 89.8 78.0 - 100.0 fl   MCHC 33.8 30.0 - 36.0 g/dL   RDW 13.1 11.5 - 15.5 %  Comprehensive metabolic panel  Result Value Ref Range   Sodium 139 135 - 145 mEq/L   Potassium 3.9 3.5 - 5.1 mEq/L   Chloride 97 96 - 112 mEq/L   CO2 30 19 - 32 mEq/L   Glucose, Bld 95 70 - 99 mg/dL   BUN 6 6 - 23 mg/dL   Creatinine, Ser 0.64 0.40 - 1.20 mg/dL   Total Bilirubin 0.4 0.2 - 1.2 mg/dL   Alkaline Phosphatase 96 39 - 117 U/L   AST 23 0 - 37 U/L   ALT 20 0 - 35 U/L   Total Protein 7.9 6.0 - 8.3 g/dL   Albumin 4.7 3.5 - 5.2 g/dL   GFR 116.66 >60.00 mL/min   Calcium 10.2 8.4 - 10.5 mg/dL  Hemoglobin A1c  Result Value Ref Range   Hgb A1c MFr Bld 5.6 4.6 - 6.5 %  Lipid panel  Result Value Ref Range   Cholesterol 233 (H) 0 - 200 mg/dL   Triglycerides 60.0 0.0 - 149.0 mg/dL   HDL 55.20 >39.00 mg/dL   VLDL 12.0 0.0 - 40.0 mg/dL   LDL Cholesterol 166 (H) 0 - 99 mg/dL   Total CHOL/HDL Ratio 4    NonHDL 177.55   TSH  Result Value Ref Range   TSH 0.26 (L) 0.35 - 4.50 uIU/mL

## 2019-08-09 NOTE — Patient Instructions (Addendum)
It was good to see you again today, I will be in touch with your labs ASAP Please do keep an eye on your blood pressure for me at home- goal is less than 135/85 I gave you 2 medications to use for your back mobic- antiinflammatory take once daily Flexeril- muscle relaxer.  Can take twice daily but will cause drowsiness  Please let me know if you are not doing better as far as your back in about 2 days- Sooner if worse.  We can plan for a physical this summer

## 2019-08-10 ENCOUNTER — Other Ambulatory Visit: Payer: Self-pay

## 2019-08-13 ENCOUNTER — Encounter: Payer: Self-pay | Admitting: Family Medicine

## 2019-08-13 ENCOUNTER — Ambulatory Visit (INDEPENDENT_AMBULATORY_CARE_PROVIDER_SITE_OTHER): Payer: BC Managed Care – PPO | Admitting: Family Medicine

## 2019-08-13 ENCOUNTER — Other Ambulatory Visit: Payer: Self-pay

## 2019-08-13 VITALS — BP 138/88 | HR 101 | Temp 97.5°F | Resp 16 | Ht 63.0 in | Wt 144.0 lb

## 2019-08-13 DIAGNOSIS — Z131 Encounter for screening for diabetes mellitus: Secondary | ICD-10-CM | POA: Diagnosis not present

## 2019-08-13 DIAGNOSIS — Z130 Encounter for screening for diseases of the blood and blood-forming organs and certain disorders involving the immune mechanism: Secondary | ICD-10-CM | POA: Diagnosis not present

## 2019-08-13 DIAGNOSIS — M545 Low back pain, unspecified: Secondary | ICD-10-CM

## 2019-08-13 DIAGNOSIS — E034 Atrophy of thyroid (acquired): Secondary | ICD-10-CM

## 2019-08-13 DIAGNOSIS — E785 Hyperlipidemia, unspecified: Secondary | ICD-10-CM | POA: Diagnosis not present

## 2019-08-13 DIAGNOSIS — R030 Elevated blood-pressure reading, without diagnosis of hypertension: Secondary | ICD-10-CM

## 2019-08-13 LAB — COMPREHENSIVE METABOLIC PANEL
ALT: 20 U/L (ref 0–35)
AST: 23 U/L (ref 0–37)
Albumin: 4.7 g/dL (ref 3.5–5.2)
Alkaline Phosphatase: 96 U/L (ref 39–117)
BUN: 6 mg/dL (ref 6–23)
CO2: 30 mEq/L (ref 19–32)
Calcium: 10.2 mg/dL (ref 8.4–10.5)
Chloride: 97 mEq/L (ref 96–112)
Creatinine, Ser: 0.64 mg/dL (ref 0.40–1.20)
GFR: 116.66 mL/min (ref >60.00)
Glucose, Bld: 95 mg/dL (ref 70–99)
Potassium: 3.9 mEq/L (ref 3.5–5.1)
Sodium: 139 mEq/L (ref 135–145)
Total Bilirubin: 0.4 mg/dL (ref 0.2–1.2)
Total Protein: 7.9 g/dL (ref 6.0–8.3)

## 2019-08-13 LAB — LIPID PANEL
Cholesterol: 233 mg/dL — ABNORMAL HIGH (ref 0–200)
HDL: 55.2 mg/dL (ref >39.00)
LDL Cholesterol: 166 mg/dL — ABNORMAL HIGH (ref 0–99)
NonHDL: 177.55
Total CHOL/HDL Ratio: 4
Triglycerides: 60 mg/dL (ref 0.0–149.0)
VLDL: 12 mg/dL (ref 0.0–40.0)

## 2019-08-13 LAB — CBC
HCT: 41 % (ref 36.0–46.0)
Hemoglobin: 13.8 g/dL (ref 12.0–15.0)
MCHC: 33.8 g/dL (ref 30.0–36.0)
MCV: 89.8 fl (ref 78.0–100.0)
Platelets: 295 10*3/uL (ref 150.0–400.0)
RBC: 4.57 Mil/uL (ref 3.87–5.11)
RDW: 13.1 % (ref 11.5–15.5)
WBC: 6.9 10*3/uL (ref 4.0–10.5)

## 2019-08-13 LAB — TSH: TSH: 0.26 u[IU]/mL — ABNORMAL LOW (ref 0.35–4.50)

## 2019-08-13 LAB — HEMOGLOBIN A1C: Hgb A1c MFr Bld: 5.6 % (ref 4.6–6.5)

## 2019-08-13 MED ORDER — CYCLOBENZAPRINE HCL 10 MG PO TABS: 10.0000 mg | ORAL_TABLET | Freq: Two times a day (BID) | ORAL | 0 refills | Status: DC | PRN

## 2019-08-13 MED ORDER — MELOXICAM 7.5 MG PO TABS: 7.5000 mg | ORAL_TABLET | Freq: Every day | ORAL | 0 refills | Status: DC

## 2019-08-13 MED ORDER — LEVOTHYROXINE SODIUM 50 MCG PO TABS: 50.0000 ug | ORAL_TABLET | Freq: Every day | ORAL | 6 refills | Status: DC

## 2019-08-13 NOTE — Addendum Note (Signed)
Addended by: Lamar Blinks C on: 08/13/2019 07:14 PM   Modules accepted: Orders

## 2019-08-14 ENCOUNTER — Encounter: Payer: Self-pay | Admitting: Family Medicine

## 2019-08-14 DIAGNOSIS — M545 Low back pain, unspecified: Secondary | ICD-10-CM

## 2019-08-15 ENCOUNTER — Encounter: Payer: Self-pay | Admitting: Family Medicine

## 2019-08-15 ENCOUNTER — Ambulatory Visit (HOSPITAL_BASED_OUTPATIENT_CLINIC_OR_DEPARTMENT_OTHER)
Admission: RE | Admit: 2019-08-15 | Discharge: 2019-08-15 | Disposition: A | Discharge status: Home / Self Care | Payer: BC Managed Care – PPO | Source: Ambulatory Visit | Attending: Family Medicine | Admitting: Family Medicine

## 2019-08-15 ENCOUNTER — Other Ambulatory Visit: Payer: Self-pay

## 2019-08-15 DIAGNOSIS — M545 Low back pain, unspecified: Secondary | ICD-10-CM

## 2019-08-23 ENCOUNTER — Telehealth: Payer: Self-pay

## 2019-08-23 NOTE — Telephone Encounter (Signed)
Patient called in to let us know that Dr. Lorelei Pont put the wrong date on her Work excuse and she needs Dr. Lorelei Pont to put the correct date on it which is 08/13/2019 can someone please follow up with the patient at [phone] thanks.

## 2019-08-24 NOTE — Telephone Encounter (Signed)
Sent mychart message to patient

## 2019-10-18 ENCOUNTER — Encounter: Payer: Self-pay | Admitting: *Deleted

## 2019-10-25 ENCOUNTER — Other Ambulatory Visit: Payer: Self-pay | Admitting: Obstetrics & Gynecology

## 2019-10-25 DIAGNOSIS — N852 Hypertrophy of uterus: Secondary | ICD-10-CM

## 2019-10-31 ENCOUNTER — Ambulatory Visit (INDEPENDENT_AMBULATORY_CARE_PROVIDER_SITE_OTHER): Payer: BC Managed Care – PPO

## 2019-10-31 ENCOUNTER — Other Ambulatory Visit: Payer: Self-pay

## 2019-10-31 DIAGNOSIS — N852 Hypertrophy of uterus: Secondary | ICD-10-CM

## 2019-11-01 ENCOUNTER — Encounter: Payer: Self-pay | Admitting: Oncology

## 2019-11-06 ENCOUNTER — Telehealth: Payer: Self-pay

## 2019-11-06 NOTE — Telephone Encounter (Signed)
Left message for patient to call back [facility] 

## 2019-11-06 NOTE — Telephone Encounter (Signed)
Left another voicemail for patient to call back [facility]

## 2019-12-30 IMAGING — CR DG CHEST 2V
2 series · 2 of 2 positions shown · non-contrast
Comparison: 04/27/2013

CLINICAL DATA: Chest pain

EXAM:
CHEST - 2 VIEW

[w chest pa]
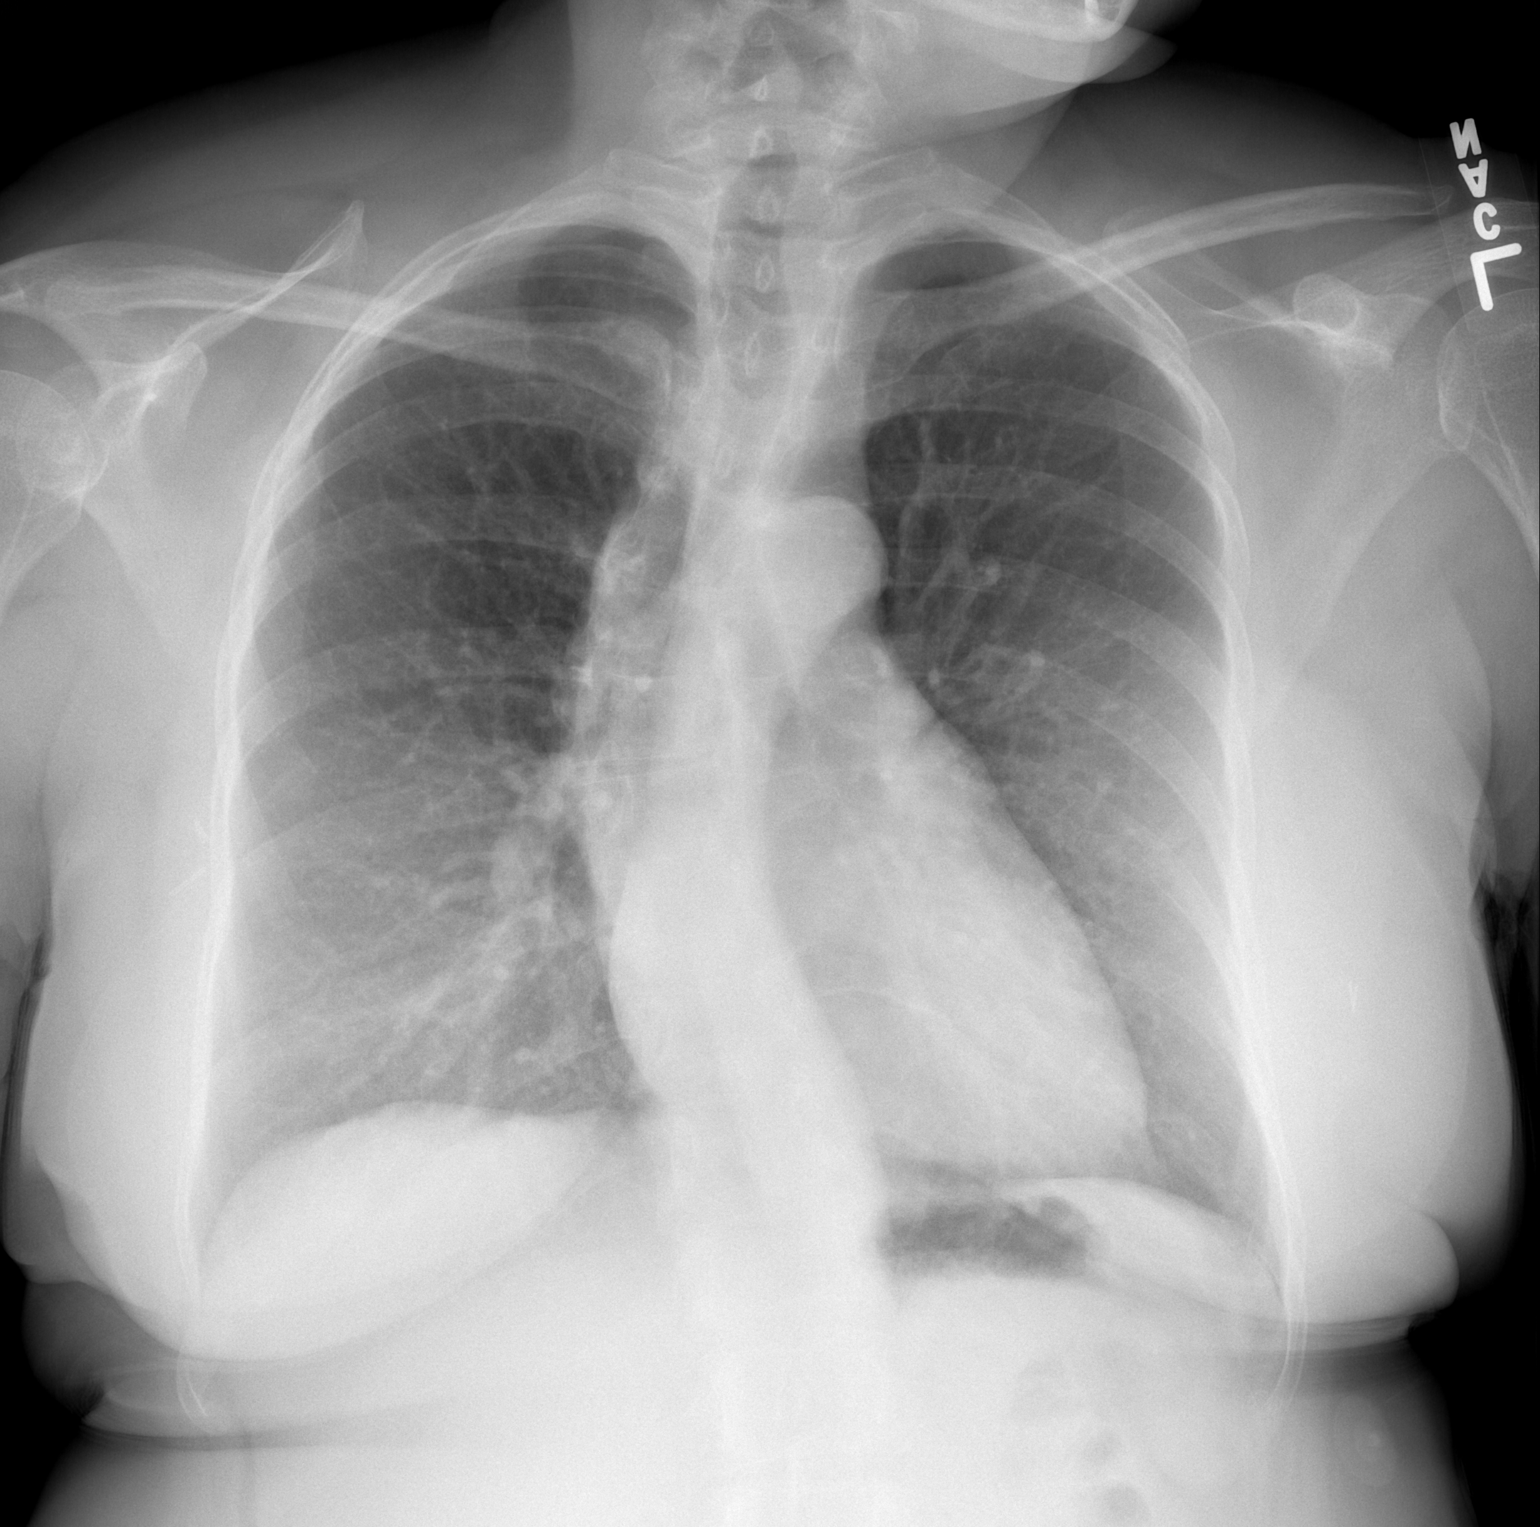

[w chest lat]
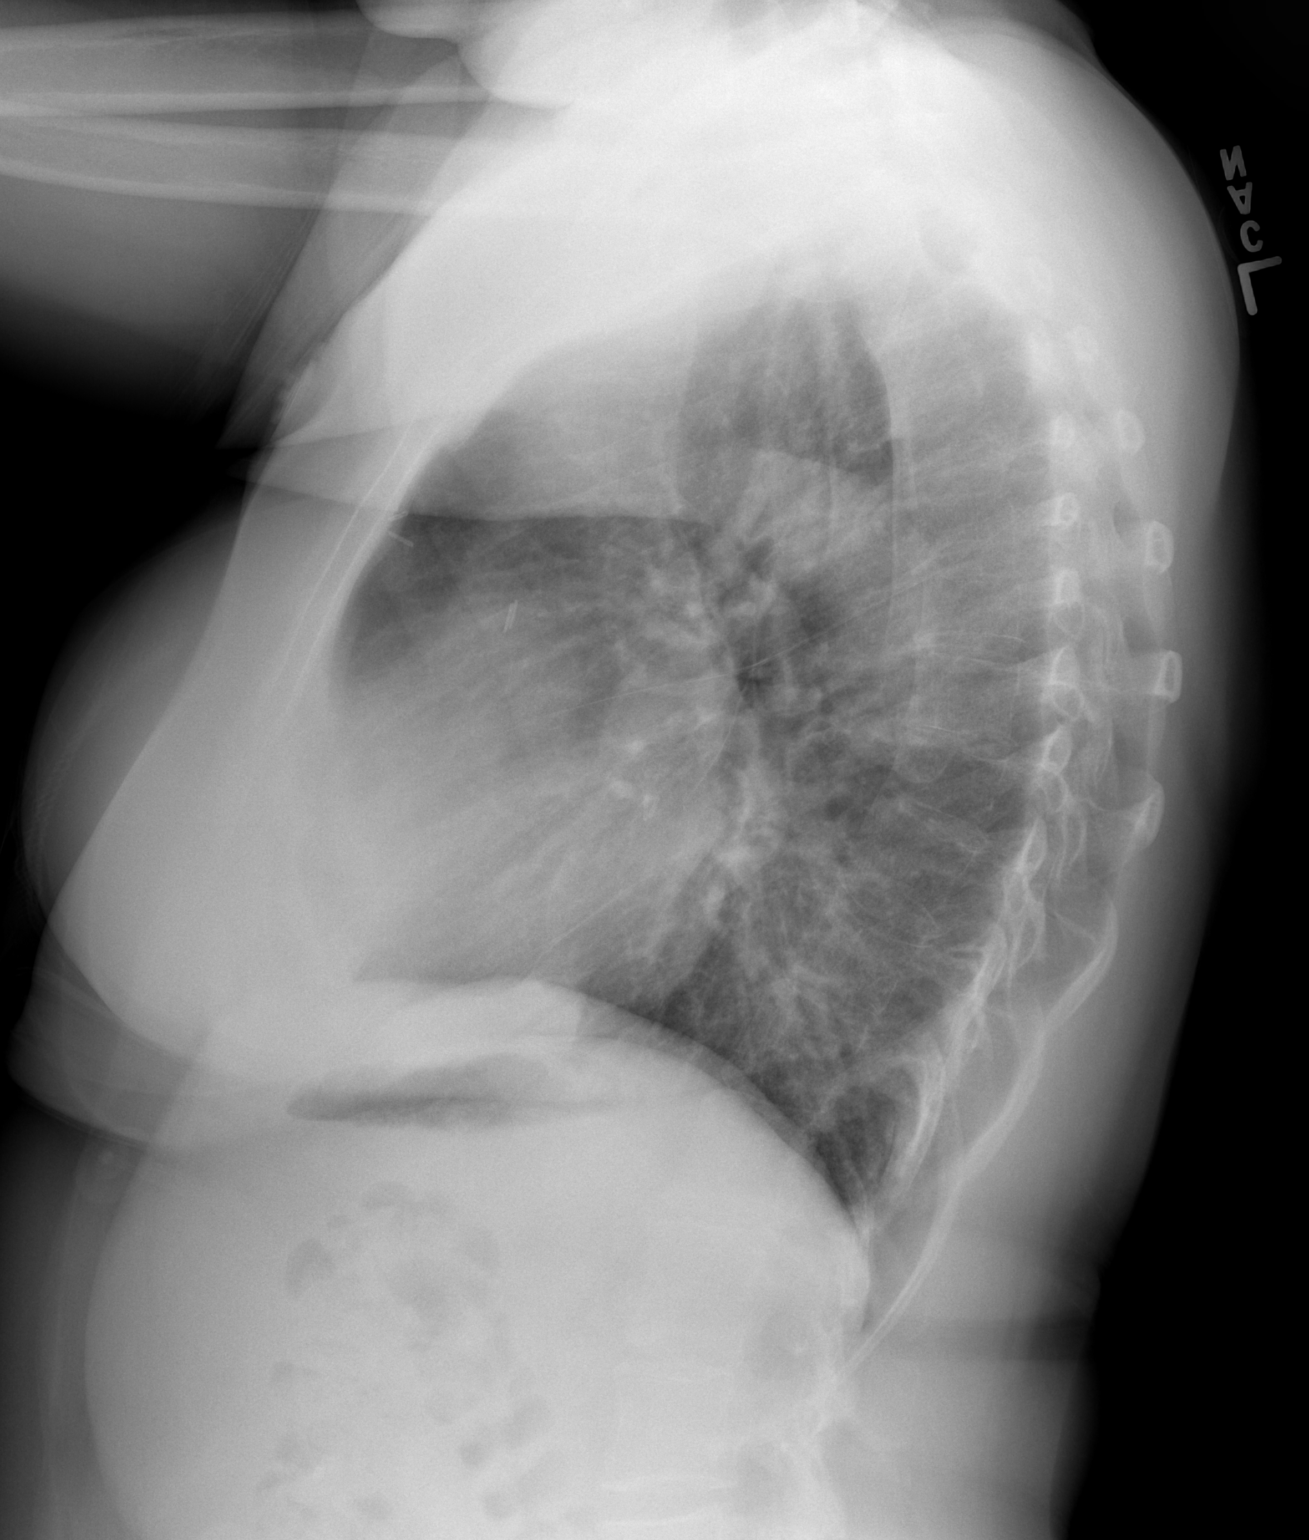

[2 of 2 positions shown; findings below may reference images not displayed]

FINDINGS: Heart and mediastinal contours are within normal limits. No focal
opacities or effusions. No acute bony abnormality. Thoracic
scoliosis.
IMPRESSION: No active cardiopulmonary disease.

## 2019-12-31 NOTE — Telephone Encounter (Signed)
Patient sent email  Good Afternoon Dr. Halford Chessman,  I am following up regarding my sleep apnea and  medical history subsequently  needing a work accommodation.   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      If you feel I still need medical accommodations then please update my medical restrictions in a letter for my employer.  If not update me on your current medical opinion.   Thank you Dr. Halford Chessman for your time,  Sincerely,  [facility]  Dr. Halford Chessman please advise on letter for patient

## 2020-01-02 ENCOUNTER — Encounter: Payer: Self-pay | Admitting: Oncology

## 2020-01-16 NOTE — Progress Notes (Signed)
[facility]  [phone] [phone]    ID: Newell Coral Brancato OB: [birth year]  MR#: [id number]  LKJ#:[id number]  Patient Care Team: [name], MD as PCP - General (Family Medicine) Debara Pickett Nadean Corwin, MD as PCP - Cardiology (Cardiology) Kannon Baum, Virgie Dad, MD as Consulting Physician (Oncology) Eusebio Friendly, MD as Referring Physician (Internal Medicine) Guss Bunde, MD as Consulting Physician (Obstetrics and Gynecology) Chesley Mires, MD as Consulting Physician (Pulmonary Disease) OTHER MD:   CHIEF COMPLAINT: Left breast cancer, triple negative (s/p bilateral mastectomies)  CURRENT THERAPY:  observation   INTERVAL HISTORY: Chelcee returns today for follow-up of her estrogen receptor negative breast cancer. She continues under observation. She has saline implants in place.  She has been undergoing physical therapy for back pain, but she recently developed intermittent pain and discomfort in her left breast. This was evaluated by Dr. Harlow Mares, who felt this to be costochondritis.   Since her last visit, she underwent transabdominal and transvaginal pelvic ultrasound on 10/31/2019 showing: calcified pedunculated leiomyoma arising from uterine fundus, seen on prior 2016 CT as well.   REVIEW OF SYSTEMS: Sherrilyn has had significant problems with back pain and she is getting physical therapy for that twice a week.  That is helpful.  She is doing other exercises and sometimes she wonders if that is exacerbating the left-sided pain but even at rest for instance right now speaking with me as she still has some discomfort in that area so is never quite gone although frequently it is quite mild.  She tells me her husband appreciates seeing Dr. Alinda Money for his prostate cancer but he has multiple other issues as well and she is his primary caregiver.  She and her husband as well as other family members have received the [place] vaccine and did well with that.  A detailed  review of systems today was otherwise stable.    BREAST CANCER HISTORY:  from the original intake note:  Natlie had routine screening mammography 03/09/2013 showing heterogeneously dense breasts, and a possible asymmetry in the left breast. Diagnostic left mammography and ultrasonography at the breast Center 03/30/2013 found a lobulated mass in the left breast upper outer quadrant measuring 1.6 cm. This was palpable. Ultrasound showed a complex microlobulated hypoechoic mass measuring 1.8 cm. There was no left axillary lymphadenopathy noted.  Biopsy of the mass in question 04/06/2013 showed ([id number]) and invasive ductal carcinoma, grade 3, triple negative, with an MIB-1 of 65%.  Bilateral breast MRI 04/13/2013 found an irregular mass measuring 2.5 cm in the left breast with a few lymph nodes that showed moderate cortical thickness, the largest measuring 12 mm (level I).  The patient's subsequent history is as detailed below   PAST MEDICAL HISTORY: Past Medical History:  Diagnosis Date  . Breast cancer ([place]) 2015   left, triple negative   . Depression 09/23/2014  . Fibroids   . Hypertension   . Hypothyroid   . Sleep apnea     PAST SURGICAL HISTORY: Past Surgical History:  Procedure Laterality Date  . AXILLARY LYMPH NODE BIOPSY Left 10/30/2013   Procedure: AXILLARY LYMPH NODE BIOPSY;  Surgeon: [name], MD;  Location: [facility];  Service: General;  Laterality: Left;  . BREAST BIOPSY Left 04/24/2013  . BREAST RECONSTRUCTION WITH PLACEMENT OF TISSUE EXPANDER AND FLEX HD (ACELLULAR HYDRATED DERMIS) Bilateral 10/30/2013   Procedure: BILATERAL BREAST RECONSTRUCTION WITH PLACEMENT OF TISSUE EXPANDER AND FLEX HD (ACELLULAR HYDRATED DERMIS);  Surgeon: [name], MD;  Location: [facility];  Service: Plastics;  Laterality: Bilateral;  . MASTECTOMY    . PORT-A-CATH REMOVAL Right 10/30/2013   Procedure: REMOVAL PORT-A-CATH;  Surgeon: [name], MD;  Location: [facility];  Service: General;   Laterality: Right;  . PORTACATH PLACEMENT Right 04/27/2013   Procedure: ULTRASOUND GUIDED PORT INSERTION WITH FLUOROSCOPY;  Surgeon: [name], MD;  [facility];  Service: General;  Laterality: Right;  . TOTAL MASTECTOMY Bilateral 10/30/2013   Procedure: TOTAL MASTECTOMY;  Surgeon: [name], MD;  Location: [facility];  Service: General;  Laterality: Bilateral;  . TUBAL LIGATION      FAMILY HISTORY Family History  Problem Relation Age of Onset  . Hypertension Mother   . Diabetes Mother   . Alzheimer's disease Mother   . Stroke Mother   . Hypertension Father   . Diabetes Father   . Hypertension Brother   . Epilepsy Son 4       being worked up for autism  . Sleep apnea Son   . Thyroid disease Neg Hx   2 the patient's father died in his 32G from complications of diabetes. The patient's mother passed away in March 25, 2018 from stroke. The patient had 2 brothers, and 2 sisters. There is no history of cancer in the family to her knowledge.   GYNECOLOGIC HISTORY:  (updated 10/03/2013)  Menarche age 54, first live birth age 54. The patient is GX P2. She is postmenopausal, but did have some perimenopausal bleeding, which was evaluated age 54 08/14/2012 with a transabdominal and transvaginal ultrasound of the pelvis which found a normal uterine myometrium and left ovary. The right ovary could not be visualized.   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 Her husband [name] also works for the [employer] system. He is currently being treated for prostate cancer. Their children are [name], [place], 10. Her mother passed away recently. The patient attends a [employer].     ADVANCED DIRECTIVES: Not in place   HEALTH MAINTENANCE:  Social History   Tobacco Use  . Smoking status: Never Smoker  . Smokeless tobacco: Never Used  Vaping Use  . Vaping Use: Never used  Substance Use Topics  . Alcohol use:  Not Currently  . Drug use: No     Colonoscopy:- June 2016  PAP: 02/24/2017  Bone density: Never  Lipid panel: 02/15/2018   No Known Allergies  Current Outpatient Medications  Medication Sig Dispense Refill  . acetaminophen (TYLENOL) 325 MG tablet Take 2 tablets (650 mg total) by mouth every 6 (six) hours as needed.    . cyclobenzaprine (FLEXERIL) 10 MG tablet Take 1 tablet (10 mg total) by mouth 2 (two) times daily as needed for muscle spasms. 30 tablet 0  . levothyroxine (SYNTHROID) 50 MCG tablet Take 1 tablet (50 mcg total) by mouth daily. Alternate with 75 mcg 30 tablet 6   No current facility-administered medications for this visit.    OBJECTIVE:   African-American woman in no acute distress  Vitals:   01/17/20 0853  BP: (!) 154/93  Pulse: 96  Resp: 20  Temp: 97.8 F (36.6 C)  SpO2: 100%  Body mass index is 26.47 kg/m.  ECOG: 1 Filed Weights   01/17/20 0853  Weight: 149 lb 6.4 oz (67.8 kg)    Sclerae unicteric, EOMs intact Wearing a mask No cervical or supraclavicular adenopathy Lungs no rales or rhonchi Heart regular rate and  rhythm Abd soft, nontender, positive bowel sounds MSK no focal spinal tenderness, no upper extremity lymphedema Neuro: nonfocal, well oriented, appropriate affect Breasts: Status post bilateral mastectomies with bilateral saline implants in place.  I do not palpate any evidence of local recurrence.  Both axilla but particularly left lung are somewhat irregular.  There is no clear mass, the no tenderness and no swelling but of course of physical exam is not a sensitive way to appreciate early local recurrence.    LAB RESULTS:  Lab Results  Component Value Date   WBC 6.9 08/13/2019   NEUTROABS 1.7 02/20/2019   HGB 13.8 08/13/2019   HCT 41.0 08/13/2019   MCV 89.8 08/13/2019   PLT 295.0 08/13/2019      Chemistry      Component Value Date/Time   NA 139 08/13/2019 0945   NA 140 05/19/2017 1035   K 3.9 08/13/2019 0945   K 3.5  05/19/2017 1035   CL 97 08/13/2019 0945   CO2 30 08/13/2019 0945   CO2 29 05/19/2017 1035   BUN 6 08/13/2019 0945   BUN 7.0 05/19/2017 1035   CREATININE 0.64 08/13/2019 0945   CREATININE 0.75 02/20/2019 1307   CREATININE 0.7 05/19/2017 1035      Component Value Date/Time   CALCIUM 10.2 08/13/2019 0945   CALCIUM 9.9 05/19/2017 1035   ALKPHOS 96 08/13/2019 0945   ALKPHOS 91 05/19/2017 1035   AST 23 08/13/2019 0945   AST 22 02/20/2019 1307   AST 21 05/19/2017 1035   ALT 20 08/13/2019 0945   ALT 21 02/20/2019 1307   ALT 16 05/19/2017 1035   BILITOT 0.4 08/13/2019 0945   BILITOT 0.3 02/20/2019 1307   BILITOT 0.39 05/19/2017 1035      STUDIES: No results found.   ASSESSMENT: 54 y.o. BRCA negative [place] woman   (1)  status post left breast upper outer quadrant biopsy 04/06/2013 for a clinical T2 N1, stage IIB invasive ductal carcinoma, high-grade, triple negative, with an MIB-1 of 65%  (2) left axillary lymph node biopsy 04/24/2013 negative for malignancy  (3)  treated in the neoadjuvant setting:   (a) completed 4 dose dense cycles of doxorubicin/ cyclophosphamide 06/11/2013,.  (b)  completed 12 weekly cycles of carboplatin/ paclitaxel 09/24/2013  (4)  status post bilateral mastectomies 10/30/2013, showing benign results on the right and a complete pathologic response in the left, including both sentinel lymph nodes clear  (5) status post bilateral breast implant reconstructive surgery  (6) pedunculated fibroid noted March 2016  (7) Right thyroid nodule noted July 2016   PLAN: Brittney Vance is now a little more than 6 years out from definitive surgery.  There is no evidence of disease recurrence at this is very favorable.  I think the pain that she is experiencing on the left side is going to be postoperative--due to scar tissue and related to her exercises.  However I cannot be 100% sure of that and it is true that some patients develop recurrence behind the transplant.   My concern is that her pain is really pretty constant even though it is not very strong most of the time.  The only way I know to really solve this problem is to get a breast MRI.  We discussed that at length today.  I have gone ahead and placed the order.  I am very hopeful that this will be benign and it should be very reassuring to her.  She had an appointment with [place] for survivorship late July.  Since I just saw her and examined her we are moving that to November  She knows to call for any other issue that may develop before the next visit  Total encounter time 25 minutes.*   Ethanael Veith, Virgie Dad, MD  01/17/20 9:17 AM Medical Oncology and Hematology [facility] [place], [place] [address] Tel. [phone]    Fax. [phone]   I, Wilburn Mylar, am acting as scribe for Dr. Virgie Dad. Pressley Barsky.  I, Lurline Del MD, have reviewed the above documentation for accuracy and completeness, and I agree with the above.    *Total Encounter Time as defined by the Centers for Medicare and Medicaid Services includes, in addition to the face-to-face time of a patient visit (documented in the note above) non-face-to-face time: obtaining and reviewing outside history, ordering and reviewing medications, tests or procedures, care coordination (communications with other health care professionals or caregivers) and documentation in the medical record.

## 2020-01-17 ENCOUNTER — Other Ambulatory Visit: Payer: Self-pay

## 2020-01-17 ENCOUNTER — Inpatient Hospital Stay: Payer: BC Managed Care – PPO | Attending: Oncology | Admitting: Oncology

## 2020-01-17 ENCOUNTER — Telehealth: Payer: Self-pay | Admitting: Oncology

## 2020-01-17 VITALS — BP 154/93 | HR 96 | Temp 97.8°F | Resp 20 | Ht 63.0 in | Wt 149.4 lb

## 2020-01-17 DIAGNOSIS — M549 Dorsalgia, unspecified: Secondary | ICD-10-CM | POA: Diagnosis not present

## 2020-01-17 DIAGNOSIS — Z171 Estrogen receptor negative status [ER-]: Secondary | ICD-10-CM | POA: Insufficient documentation

## 2020-01-17 DIAGNOSIS — Z820 Family history of epilepsy and other diseases of the nervous system: Secondary | ICD-10-CM | POA: Diagnosis not present

## 2020-01-17 DIAGNOSIS — Z86018 Personal history of other benign neoplasm: Secondary | ICD-10-CM | POA: Diagnosis not present

## 2020-01-17 DIAGNOSIS — Z833 Family history of diabetes mellitus: Secondary | ICD-10-CM | POA: Diagnosis not present

## 2020-01-17 DIAGNOSIS — C50412 Malignant neoplasm of upper-outer quadrant of left female breast: Secondary | ICD-10-CM

## 2020-01-17 DIAGNOSIS — E039 Hypothyroidism, unspecified: Secondary | ICD-10-CM | POA: Diagnosis not present

## 2020-01-17 DIAGNOSIS — Z79899 Other long term (current) drug therapy: Secondary | ICD-10-CM | POA: Insufficient documentation

## 2020-01-17 DIAGNOSIS — Z836 Family history of other diseases of the respiratory system: Secondary | ICD-10-CM | POA: Diagnosis not present

## 2020-01-17 DIAGNOSIS — G8918 Other acute postprocedural pain: Secondary | ICD-10-CM | POA: Diagnosis not present

## 2020-01-17 DIAGNOSIS — Z9013 Acquired absence of bilateral breasts and nipples: Secondary | ICD-10-CM | POA: Diagnosis not present

## 2020-01-17 DIAGNOSIS — Z8249 Family history of ischemic heart disease and other diseases of the circulatory system: Secondary | ICD-10-CM | POA: Diagnosis not present

## 2020-01-17 NOTE — Telephone Encounter (Signed)
Scheduled appts per 6/24 los. Pt declined print out of AVS and stated she would refer to [place].

## 2020-01-22 ENCOUNTER — Encounter: Payer: Self-pay | Admitting: Family Medicine

## 2020-01-22 ENCOUNTER — Other Ambulatory Visit: Payer: Self-pay | Admitting: Oncology

## 2020-01-22 ENCOUNTER — Other Ambulatory Visit: Payer: Self-pay

## 2020-01-22 ENCOUNTER — Ambulatory Visit: Payer: BC Managed Care – PPO | Admitting: Medical

## 2020-01-22 ENCOUNTER — Telehealth (INDEPENDENT_AMBULATORY_CARE_PROVIDER_SITE_OTHER): Payer: BC Managed Care – PPO | Admitting: Family Medicine

## 2020-01-22 DIAGNOSIS — H10022 Other mucopurulent conjunctivitis, left eye: Secondary | ICD-10-CM

## 2020-01-22 DIAGNOSIS — C50412 Malignant neoplasm of upper-outer quadrant of left female breast: Secondary | ICD-10-CM

## 2020-01-22 MED ORDER — MOXIFLOXACIN HCL 0.5 % OP SOLN: 1.0000 [drp] | Freq: Three times a day (TID) | OPHTHALMIC | 0 refills | Status: DC

## 2020-01-22 NOTE — Patient Instructions (Signed)
Obesity, Adult Obesity is the condition of having too much total body fat. Being overweight or obese means that your weight is greater than what is considered healthy for your body size. Obesity is determined by a measurement called BMI. BMI is an estimate of body fat and is calculated from height and weight. For adults, a BMI of 30 or higher is considered obese. Obesity can lead to other health concerns and major illnesses, including:  Stroke.  Coronary artery disease (CAD).  Type 2 diabetes.  Some types of cancer, including cancers of the colon, breast, uterus, and gallbladder.  Osteoarthritis.  High blood pressure (hypertension).  High cholesterol.  Sleep apnea.  Gallbladder stones.  Infertility problems. What are the causes? Common causes of this condition include:  Eating daily meals that are high in calories, sugar, and fat.  Being born with genes that may make you more likely to become obese.  Having a medical condition that causes obesity, including: ? Hypothyroidism. ? Polycystic ovarian syndrome (PCOS). ? Binge-eating disorder. ? Cushing syndrome.  Taking certain medicines, such as steroids, antidepressants, and seizure medicines.  Not being physically active (sedentary lifestyle).  Not getting enough sleep.  Drinking high amounts of sugar-sweetened beverages, such as soft drinks. What increases the risk? The following factors may make you more likely to develop this condition:  Having a family history of obesity.  Being a woman of African American descent.  Being a man of Hispanic descent.  Living in an area with limited access to: ? Romilda Garret, recreation centers, or sidewalks. ? Healthy food choices, such as grocery stores and farmers' markets. What are the signs or symptoms? The main sign of this condition is having too much body fat. How is this diagnosed? This condition is diagnosed based on:  Your BMI. If you are an adult with a BMI of 30 or  higher, you are considered obese.  Your waist circumference. This measures the distance around your waistline.  Your skinfold thickness. Your health care provider may gently pinch a fold of your skin and measure it. You may have other tests to check for underlying conditions. How is this treated? Treatment for this condition often includes changing your lifestyle. Treatment may include some or all of the following:  Dietary changes. This may include developing a healthy meal plan.  Regular physical activity. This may include activity that causes your heart to beat faster (aerobic exercise) and strength training. Work with your health care provider to design an exercise program that works for you.  Medicine to help you lose weight if you are unable to lose 1 pound a week after 6 weeks of healthy eating and more physical activity.  Treating conditions that cause the obesity (underlying conditions).  Surgery. Surgical options may include gastric banding and gastric bypass. Surgery may be done if: ? Other treatments have not helped to improve your condition. ? You have a BMI of 40 or higher. ? You have life-threatening health problems related to obesity. Follow these instructions at home: Eating and drinking   Follow recommendations from your health care provider about what you eat and drink. Your health care provider may advise you to: ? Limit fast food, sweets, and processed snack foods. ? Choose low-fat options, such as low-fat milk instead of whole milk. ? Eat 5 or more servings of fruits or vegetables every day. ? Eat at home more often. This gives you more control over what you eat. ? Choose healthy foods when you eat out. ?  Learn to read food labels. This will help you understand how much food is considered 1 serving. ? Learn what a healthy serving size is. ? Keep low-fat snacks available. ? Limit sugary drinks, such as soda, fruit juice, sweetened iced tea, and flavored  milk.  Drink enough water to keep your urine pale yellow.  Do not follow a fad diet. Fad diets can be unhealthy and even dangerous. Physical activity  Exercise regularly, as told by your health care provider. ? Most adults should get up to 150 minutes of moderate-intensity exercise every week. ? Ask your health care provider what types of exercise are safe for you and how often you should exercise.  Warm up and stretch before being active.  Cool down and stretch after being active.  Rest between periods of activity. Lifestyle  Work with your health care provider and a dietitian to set a weight-loss goal that is healthy and reasonable for you.  Limit your screen time.  Find ways to reward yourself that do not involve food.  Do not drink alcohol if: ? Your health care provider tells you not to drink. ? You are pregnant, may be pregnant, or are planning to become pregnant.  If you drink alcohol: ? Limit how much you use to:  0-1 drink a day for women.  0-2 drinks a day for men. ? Be aware of how much alcohol is in your drink. In the [place] one drink equals one 12 oz bottle of beer (355 mL), one 5 oz glass of wine (148 mL), or one 1 oz glass of hard liquor (44 mL). General instructions  Keep a weight-loss journal to keep track of the food you eat and how much exercise you get.  Take over-the-counter and prescription medicines only as told by your health care provider.  Take vitamins and supplements only as told by your health care provider.  Consider joining a support group. Your health care provider may be able to recommend a support group.  Keep all follow-up visits as told by your health care provider. This is important. Contact a health care provider if:  You are unable to meet your weight loss goal after 6 weeks of dietary and lifestyle changes. Get help right away if you are having:  Trouble breathing.  Suicidal thoughts or behaviors. Summary  Obesity is the  condition of having too much total body fat.  Being overweight or obese means that your weight is greater than what is considered healthy for your body size.  Work with your health care provider and a dietitian to set a weight-loss goal that is healthy and reasonable for you.  Exercise regularly, as told by your health care provider. Ask your health care provider what types of exercise are safe for you and how often you should exercise. This information is not intended to replace advice given to you by your health care provider. Make sure you discuss any questions you have with your health care provider. Document Revised: 03/16/2018 Document Reviewed: 03/16/2018 Elsevier Patient Education  2020 Elsevier Inc.  

## 2020-01-23 NOTE — Progress Notes (Signed)
Virtual Visit via Video Note  I connected with Brittney Vance on 01/23/20 at  4:10 PM EDT by a video enabled telemedicine application and verified that I am speaking with the correct person using two identifiers.  Location: Patient: home alone Provider: office    I discussed the limitations of evaluation and management by telemedicine and the availability of in person appointments. The patient expressed understanding and agreed to proceed.  History of Present Illness: Pt is home c/o waking up with eye crusted shut , + d/c , itchy No vision changes    Observations/Objective: There were no vitals filed for this visit. Pt is in nad L eye-- injected , + white d/c   Assessment and Plan: 1. Pink eye disease of left eye Cool compresses , abx eye drops--- go to eye dr if symptoms do not improve in next few days or if worsens  - moxifloxacin (VIGAMOX) 0.5 % ophthalmic solution; Place 1 drop into the left eye 3 (three) times daily.  Dispense: 3 mL; Refill: 0 Wash linens, towels etc daily Follow Up Instructions:    I discussed the assessment and treatment plan with the patient. The patient was provided an opportunity to ask questions and all were answered. The patient agreed with the plan and demonstrated an understanding of the instructions.   The patient was advised to call back or seek an in-person evaluation if the symptoms worsen or if the condition fails to improve as anticipated.  I provided 25 minutes of non-face-to-face time during this encounter.   Ann Held, DO

## 2020-02-01 ENCOUNTER — Ambulatory Visit
Admission: RE | Admit: 2020-02-01 | Discharge: 2020-02-01 | Disposition: A | Discharge status: Home / Self Care | Payer: BC Managed Care – PPO | Source: Ambulatory Visit | Attending: Oncology | Admitting: Oncology

## 2020-02-01 ENCOUNTER — Other Ambulatory Visit: Payer: Self-pay

## 2020-02-01 DIAGNOSIS — C50412 Malignant neoplasm of upper-outer quadrant of left female breast: Secondary | ICD-10-CM

## 2020-02-05 ENCOUNTER — Other Ambulatory Visit: Payer: BC Managed Care – PPO

## 2020-02-05 ENCOUNTER — Encounter: Payer: Self-pay | Admitting: *Deleted

## 2020-02-12 ENCOUNTER — Other Ambulatory Visit: Payer: Self-pay

## 2020-02-12 ENCOUNTER — Encounter: Payer: Self-pay | Admitting: Pulmonary Disease

## 2020-02-12 ENCOUNTER — Ambulatory Visit: Payer: BC Managed Care – PPO | Admitting: Pulmonary Disease

## 2020-02-12 VITALS — BP 140/90 | HR 92 | Temp 98.5°F | Ht 63.0 in | Wt 147.0 lb

## 2020-02-12 DIAGNOSIS — G4733 Obstructive sleep apnea (adult) (pediatric): Secondary | ICD-10-CM | POA: Diagnosis not present

## 2020-02-12 NOTE — Progress Notes (Signed)
Pulmonary, Critical Care, and Sleep Medicine  Chief Complaint  Patient presents with  . Follow-up    Doing well with CPAP. No new co's.    Constitutional:  BP 140/90 (BP Location: Left Arm, Cuff Size: Normal)   Pulse 92   Temp 98.5 F (36.9 C) (Oral)   Ht 5\' 3"  (1.6 m)   Wt 147 lb (66.7 kg)   LMP 03/10/2013 (LMP Unknown)   SpO2 100% Comment: on RA  BMI 26.04 kg/m   Past Medical History:  Breast cancer, Depression, Fibroids, HTN, Hypothyroidism  Brief Summary:  Brittney Vance is a 54 y.o. female with obstructive sleep apnea.  Subjective:   Uses CPAP nightly.  Sometimes fall asleep before putting mask on.  Pressure okay.  Has nasal pillows. Not having sinus congestion, dry mouth.  Hurt her back at work.  Better not.  Not causing too much trouble with her sleep currently.  Physical Exam:    Appearance - well kempt   ENMT - no sinus tenderness, no oral exudate, no LAN, Mallampati 3 airway, no stridor  Respiratory - equal breath sounds bilaterally, no wheezing or rales  CV - s1s2 regular rate and rhythm, no murmurs  Ext - no clubbing, no edema  Skin - no rashes  Psych - normal mood and affect   Assessment/Plan:   Obstructive sleep apnea. - she is compliant with CPAP - continue CPAP 11 cm H2O - discussed options to help with mask fit  A total of  21 minutes spent addressing patient care issues on day of visit.   Follow up:   Patient Instructions  Follow up in 1 year   Signature:  Chesley Mires, MD Brittney Vance Pager: [phone] 02/12/2020, 10:22 AM  Flow Sheet    Sleep tests:   PSG 07/22/18 >> AHI 33.8, SpO2 low 81%.  CPAP 10 cm H2O >> AHI 0.  CPAP 01/12/20 to 02/10/20 >> used on 30 of 30 nights with average 5 hrs 51 min.  Average AHI 4.2 with CPAP 11 cm H2O.  Cardiac tests:   Echo 04/30/13 >> mild LVH, EF 55 to 60%, grade 1 DD  Medications:   Allergies as of 02/12/2020   No Known Allergies     Medication List         Accurate as of February 12, 2020 10:22 AM. If you have any questions, ask your nurse or doctor.        acetaminophen 650 MG CR tablet Commonly known as: TYLENOL Take 650 mg by mouth every 6 (six) hours as needed for pain. What changed: Another medication with the same name was removed. Continue taking this medication, and follow the directions you see here. Changed by: Chesley Mires, MD   cyclobenzaprine 10 MG tablet Commonly known as: FLEXERIL Take 1 tablet (10 mg total) by mouth 2 (two) times daily as needed for muscle spasms.   moxifloxacin 0.5 % ophthalmic solution Commonly known as: Vigamox Place 1 drop into the left eye 3 (three) times daily.   levothyroxine 50 MCG tablet Commonly known as: SYNTHROID Take 50 mcg by mouth every other day. What changed: Another medication with the same name was removed. Continue taking this medication, and follow the directions you see here. Changed by: Chesley Mires, MD   Synthroid 75 MCG tablet Generic drug: levothyroxine Take 75 mcg by mouth every other day. What changed: Another medication with the same name was removed. Continue taking this medication, and follow the directions you see here.  Changed by: Chesley Mires, MD       Past Surgical History:  She  has a past surgical history that includes Tubal ligation; Portacath placement (Right, 04/27/2013); Breast biopsy (Left, 04/24/2013); Total mastectomy (Bilateral, 10/30/2013); Axillary lymph node biopsy (Left, 10/30/2013); Port-a-cath removal (Right, 10/30/2013); Breast reconstruction with placement of tissue expander and flex hd (acellular hydrated dermis) (Bilateral, 10/30/2013); and Mastectomy.  Family History:  Her family history includes Alzheimer's disease in her mother; Diabetes in her father and mother; Epilepsy (age of onset: 59) in her son; Hypertension in her brother, father, and mother; Sleep apnea in her son; Stroke in her mother.  Social History:  She  reports that she has never  smoked. She has never used smokeless tobacco. She reports previous alcohol use. She reports that she does not use drugs.

## 2020-02-12 NOTE — Patient Instructions (Signed)
Follow up in 1 year.

## 2020-02-14 ENCOUNTER — Other Ambulatory Visit: Payer: Self-pay | Admitting: Oncology

## 2020-02-18 ENCOUNTER — Encounter: Payer: BC Managed Care – PPO | Admitting: Family Medicine

## 2020-02-20 ENCOUNTER — Encounter: Payer: BC Managed Care – PPO | Admitting: Family Medicine

## 2020-02-21 ENCOUNTER — Encounter: Payer: BC Managed Care – PPO | Admitting: Adult Health

## 2020-03-20 NOTE — Progress Notes (Addendum)
[facility] [address], [place], [place] [address] [phone] [id number]  Date:  03/24/2020   Name:  Brittney Vance   DOB:  11/12/1965   MRN:  [id number]  PCP:  Brittney Mclean, MD    Chief Complaint: Annual Exam   History of Present Illness:  Brittney Vance is a 54 y.o. very pleasant female patient who presents with the following:  Here today for CPE Last seen by myself in January of this year  History of hypertension, hypothyroidism, breast cancer 2014, depression, sleep apnea She is s/p bilateral mastectomy.  Her oncologist is Dr. Danae Vance recent visit in June Cardiologist is Dr. Debara Vance She sees pulmonology for management of sleep apnea/ OSA  She has a 3 yo daughter in high school, and an 66 year-old son who does have autism-they both attend [employer], thankfully they have been able to attend school in person both last year and this year.  Both are immunized against COVID-19  Hep C screen- do today covid series- done!   Discussed timing of booster Shingrix-discussed with her today, she would like to defer Pap per her GYN- Dr Brittney Vance longer needed due to bilateral mastectomy Colon- UTD, 2016 Labs done in January needs repeat TSH  She saw Dr Halford Chessman just recently for sleep apnea follow-up, all ok  Saw oncology in June; she is now 6 years out from bilateral mastectomy  It looks like they discussed a breast MRI as she was having pain but I do not see this result  She is fasting today   Magdeline does have some back issues which limit her ability to exercise.  Typically she tries to walk about a mile a day, this is been more challenging recently due to heat.  She plans to work on keeping up with this better, I also suggested that she try a recumbent bicycle Weight stable  Wt Readings from Last 3 Encounters:  03/24/20 147 lb (66.7 kg)  02/12/20 147 lb (66.7 kg)  01/17/20 149 lb 6.4 oz (67.8 kg)      Patient Active Problem List   Diagnosis Date Noted  . Goiter 02/06/2015  . Fibroid, uterine 10/03/2014  . Pain in joint, pelvic region and thigh 09/23/2014  . Depression 09/23/2014  . Genital herpes 03/15/2014  . Hypokalemia 10/03/2013  . Anemia associated with chemotherapy 09/17/2013  . Neutrophils decreased ([place]) 08/13/2013  . Malignant neoplasm of upper-outer quadrant of left breast in female, estrogen receptor negative ([place]) 04/26/2013  . Hypothyroidism 01/28/2013  . Hypertension 01/25/2013  . Perimenopause 01/25/2013    Past Medical History:  Diagnosis Date  . Breast cancer ([place]) 2015   left, triple negative   . Depression 09/23/2014  . Fibroids   . Hypertension   . Hypothyroid   . Sleep apnea     Past Surgical History:  Procedure Laterality Date  . AXILLARY LYMPH NODE BIOPSY Left 10/30/2013   Procedure: AXILLARY LYMPH NODE BIOPSY;  Surgeon: [name], MD;  Location: [facility];  Service: General;  Laterality: Left;  . BREAST BIOPSY Left 04/24/2013  . BREAST RECONSTRUCTION WITH PLACEMENT OF TISSUE EXPANDER AND FLEX HD (ACELLULAR HYDRATED DERMIS) Bilateral 10/30/2013   Procedure: BILATERAL BREAST RECONSTRUCTION WITH PLACEMENT OF TISSUE EXPANDER AND FLEX HD (ACELLULAR HYDRATED DERMIS);  Surgeon: [name], MD;  Location: [facility];  Service: Plastics;  Laterality: Bilateral;  . MASTECTOMY    . PORT-A-CATH REMOVAL Right 10/30/2013  Procedure: REMOVAL PORT-A-CATH;  Surgeon: [name], MD;  Location: [facility];  Service: General;  Laterality: Right;  . PORTACATH PLACEMENT Right 04/27/2013   Procedure: ULTRASOUND GUIDED PORT INSERTION WITH FLUOROSCOPY;  Surgeon: [name], MD;  Location: [facility];  Service: General;  Laterality: Right;  . TOTAL MASTECTOMY Bilateral 10/30/2013   Procedure: TOTAL MASTECTOMY;  Surgeon: [name], MD;  Location: [facility];  Service: General;  Laterality: Bilateral;  . TUBAL LIGATION      Social History   Tobacco Use  . Smoking  status: Never Smoker  . Smokeless tobacco: Never Used  Vaping Use  . Vaping Use: Never used  Substance Use Topics  . Alcohol use: Not Currently  . Drug use: No    Family History  Problem Relation Age of Onset  . Hypertension Mother   . Diabetes Mother   . Alzheimer's disease Mother   . Stroke Mother   . Hypertension Father   . Diabetes Father   . Hypertension Brother   . Epilepsy Son 4       being worked up for autism  . Sleep apnea Son   . Thyroid disease Neg Hx     No Known Allergies  Medication list has been reviewed and updated.  Current Outpatient Medications on File Prior to Visit  Medication Sig Dispense Refill  . acetaminophen (TYLENOL) 650 MG CR tablet Take 650 mg by mouth every 6 (six) hours as needed for pain.    . cyclobenzaprine (FLEXERIL) 10 MG tablet Take 1 tablet (10 mg total) by mouth 2 (two) times daily as needed for muscle spasms. 30 tablet 0  . levothyroxine (SYNTHROID) 50 MCG tablet Take 50 mcg by mouth every other day.    Marland Kitchen SYNTHROID 75 MCG tablet Take 75 mcg by mouth every other day.      No current facility-administered medications on file prior to visit.    Review of Systems:  As per HPI- otherwise negative.   Physical Examination: Vitals:   03/24/20 0937  BP: 122/82  Pulse: 86  Resp: 17  SpO2: 99%   Vitals:   03/24/20 0937  Weight: 147 lb (66.7 kg)  Height: 5\' 3"  (1.6 m)   Body mass index is 26.04 kg/m. Ideal Body Weight: Weight in (lb) to have BMI = 25: 140.8  GEN: no acute distress.  Minimal overweight, looks well  HEENT: Atraumatic, Normocephalic.   Bilateral TM wnl, oropharynx normal.  PEERL,EOMI.   Ears and Nose: No external deformity. CV: RRR, No M/G/R. No JVD. No thrill. No extra heart sounds. PULM: CTA B, no wheezes, crackles, rhonchi. No retractions. No resp. distress. No accessory muscle use. ABD: S, NT, ND, +BS. No rebound. No HSM. EXTR: No c/c/e PSYCH: Normally interactive. Conversant.    Assessment and  Plan: Physical exam  Hypothyroidism due to acquired atrophy of thyroid - Plan: TSH  Dyslipidemia  Encounter for hepatitis C screening test for low risk patient - Plan: Hepatitis C antibody  Screening for diabetes mellitus - Plan: Basic metabolic panel, Hemoglobin A1c  CPE today- labs pending as above Encouraged healthy diet and exercise routine Will plan further follow- up pending labs.  This visit occurred during the SARS-CoV-2 public health emergency.  Safety protocols were in place, including screening questions prior to the visit, additional usage of staff PPE, and extensive cleaning of exam room while observing appropriate contact time as indicated for disinfecting solutions.    Signed Lamar Blinks, MD  Received her labs  8/31- message to pt  Results for orders placed or performed in visit on 03/24/20  TSH  Result Value Ref Range   TSH 2.78 mIU/L  Basic metabolic panel  Result Value Ref Range   Glucose, Bld 84 65 - 99 mg/dL   BUN 11 7 - 25 mg/dL   Creat 0.61 0.50 - 1.05 mg/dL   BUN/Creatinine Ratio NOT APPLICABLE 6 - 22 (calc)   Sodium 140 135 - 146 mmol/L   Potassium 4.0 3.5 - 5.3 mmol/L   Chloride 102 98 - 110 mmol/L   CO2 26 20 - 32 mmol/L   Calcium 9.7 8.6 - 10.4 mg/dL  Hemoglobin A1c  Result Value Ref Range   Hgb A1c MFr Bld 5.4 <5.7 % of total Hgb   Mean Plasma Glucose 108 (calc)   eAG (mmol/L) 6.0 (calc)

## 2020-03-23 NOTE — Patient Instructions (Signed)
It was great to see you again today, please see me in about 6 months I will be in touch with your labs Plan for covid booster, flu shot this fall, shingles vaccine at your convenience  Work on increasing exercise- ?recumbent exercise bike might work for you   Health Maintenance, Female Adopting a healthy lifestyle and getting preventive care are important in promoting health and wellness. Ask your health care provider about:  The right schedule for you to have regular tests and exams.  Things you can do on your own to prevent diseases and keep yourself healthy. What should I know about diet, weight, and exercise? Eat a healthy diet   Eat a diet that includes plenty of vegetables, fruits, low-fat dairy products, and lean protein.  Do not eat a lot of foods that are high in solid fats, added sugars, or sodium. Maintain a healthy weight Body mass index (BMI) is used to identify weight problems. It estimates body fat based on height and weight. Your health care provider can help determine your BMI and help you achieve or maintain a healthy weight. Get regular exercise Get regular exercise. This is one of the most important things you can do for your health. Most adults should:  Exercise for at least 150 minutes each week. The exercise should increase your heart rate and make you sweat (moderate-intensity exercise).  Do strengthening exercises at least twice a week. This is in addition to the moderate-intensity exercise.  Spend less time sitting. Even light physical activity can be beneficial. Watch cholesterol and blood lipids Have your blood tested for lipids and cholesterol at 55 years of age, then have this test every 5 years. Have your cholesterol levels checked more often if:  Your lipid or cholesterol levels are high.  You are older than 55 years of age.  You are at high risk for heart disease. What should I know about cancer screening? Depending on your health history and  family history, you may need to have cancer screening at various ages. This may include screening for:  Breast cancer.  Cervical cancer.  Colorectal cancer.  Skin cancer.  Lung cancer. What should I know about heart disease, diabetes, and high blood pressure? Blood pressure and heart disease  High blood pressure causes heart disease and increases the risk of stroke. This is more likely to develop in people who have high blood pressure readings, are of African descent, or are overweight.  Have your blood pressure checked: ? Every 3-5 years if you are 48-68 years of age. ? Every year if you are 4 years old or older. Diabetes Have regular diabetes screenings. This checks your fasting blood sugar level. Have the screening done:  Once every three years after age 77 if you are at a normal weight and have a low risk for diabetes.  More often and at a younger age if you are overweight or have a high risk for diabetes. What should I know about preventing infection? Hepatitis B If you have a higher risk for hepatitis B, you should be screened for this virus. Talk with your health care provider to find out if you are at risk for hepatitis B infection. Hepatitis C Testing is recommended for:  Everyone born from [birth year] through 1966.  Anyone with known risk factors for hepatitis C. Sexually transmitted infections (STIs)  Get screened for STIs, including gonorrhea and chlamydia, if: ? You are sexually active and are younger than 55 years of age. ? You are  older than 55 years of age and your health care provider tells you that you are at risk for this type of infection. ? Your sexual activity has changed since you were last screened, and you are at increased risk for chlamydia or gonorrhea. Ask your health care provider if you are at risk.  Ask your health care provider about whether you are at high risk for HIV. Your health care provider may recommend a prescription medicine to help prevent  HIV infection. If you choose to take medicine to prevent HIV, you should first get tested for HIV. You should then be tested every 3 months for as long as you are taking the medicine. Pregnancy  If you are about to stop having your period (premenopausal) and you may become pregnant, seek counseling before you get pregnant.  Take 400 to 800 micrograms (mcg) of folic acid every day if you become pregnant.  Ask for birth control (contraception) if you want to prevent pregnancy. Osteoporosis and menopause Osteoporosis is a disease in which the bones lose minerals and strength with aging. This can result in bone fractures. If you are 38 years old or older, or if you are at risk for osteoporosis and fractures, ask your health care provider if you should:  Be screened for bone loss.  Take a calcium or vitamin D supplement to lower your risk of fractures.  Be given hormone replacement therapy (HRT) to treat symptoms of menopause. Follow these instructions at home: Lifestyle  Do not use any products that contain nicotine or tobacco, such as cigarettes, e-cigarettes, and chewing tobacco. If you need help quitting, ask your health care provider.  Do not use street drugs.  Do not share needles.  Ask your health care provider for help if you need support or information about quitting drugs. Alcohol use  Do not drink alcohol if: ? Your health care provider tells you not to drink. ? You are pregnant, may be pregnant, or are planning to become pregnant.  If you drink alcohol: ? Limit how much you use to 0-1 drink a day. ? Limit intake if you are breastfeeding.  Be aware of how much alcohol is in your drink. In the [place] one drink equals one 12 oz bottle of beer (355 mL), one 5 oz glass of wine (148 mL), or one 1 oz glass of hard liquor (44 mL). General instructions  Schedule regular health, dental, and eye exams.  Stay current with your vaccines.  Tell your health care provider if: ? You  often feel depressed. ? You have ever been abused or do not feel safe at home. Summary  Adopting a healthy lifestyle and getting preventive care are important in promoting health and wellness.  Follow your health care provider's instructions about healthy diet, exercising, and getting tested or screened for diseases.  Follow your health care provider's instructions on monitoring your cholesterol and blood pressure. This information is not intended to replace advice given to you by your health care provider. Make sure you discuss any questions you have with your health care provider. Document Revised: 07/05/2018 Document Reviewed: 07/05/2018 Elsevier Patient Education  2020 [employer].

## 2020-03-24 ENCOUNTER — Encounter: Payer: Self-pay | Admitting: Family Medicine

## 2020-03-24 ENCOUNTER — Ambulatory Visit (INDEPENDENT_AMBULATORY_CARE_PROVIDER_SITE_OTHER): Payer: BC Managed Care – PPO | Admitting: Family Medicine

## 2020-03-24 ENCOUNTER — Other Ambulatory Visit: Payer: Self-pay

## 2020-03-24 VITALS — BP 122/82 | HR 86 | Resp 17 | Ht 63.0 in | Wt 147.0 lb

## 2020-03-24 DIAGNOSIS — E034 Atrophy of thyroid (acquired): Secondary | ICD-10-CM

## 2020-03-24 DIAGNOSIS — Z0000 Encounter for general adult medical examination without abnormal findings: Secondary | ICD-10-CM

## 2020-03-24 DIAGNOSIS — Z131 Encounter for screening for diabetes mellitus: Secondary | ICD-10-CM

## 2020-03-24 DIAGNOSIS — Z1159 Encounter for screening for other viral diseases: Secondary | ICD-10-CM

## 2020-03-24 DIAGNOSIS — E785 Hyperlipidemia, unspecified: Secondary | ICD-10-CM | POA: Diagnosis not present

## 2020-03-25 ENCOUNTER — Encounter: Payer: Self-pay | Admitting: Family Medicine

## 2020-03-25 LAB — BASIC METABOLIC PANEL
BUN: 11 mg/dL (ref 7–25)
CO2: 26 mmol/L (ref 20–32)
Calcium: 9.7 mg/dL (ref 8.6–10.4)
Chloride: 102 mmol/L (ref 98–110)
Creat: 0.61 mg/dL (ref 0.50–1.05)
Glucose, Bld: 84 mg/dL (ref 65–99)
Potassium: 4 mmol/L (ref 3.5–5.3)
Sodium: 140 mmol/L (ref 135–146)

## 2020-03-25 LAB — HEPATITIS C ANTIBODY
Hepatitis C Ab: NONREACTIVE
SIGNAL TO CUT-OFF: 0.03 (ref <1.00)

## 2020-03-25 LAB — HEMOGLOBIN A1C
Hgb A1c MFr Bld: 5.4 % of total Hgb (ref <5.7)
Mean Plasma Glucose: 108 (calc)
eAG (mmol/L): 6 (calc)

## 2020-03-25 LAB — TSH: TSH: 2.78 mIU/L

## 2020-03-28 ENCOUNTER — Encounter: Payer: Self-pay | Admitting: *Deleted

## 2020-06-05 ENCOUNTER — Telehealth: Payer: Self-pay | Admitting: Adult Health

## 2020-06-05 ENCOUNTER — Encounter: Payer: Self-pay | Admitting: Adult Health

## 2020-06-05 ENCOUNTER — Inpatient Hospital Stay: Payer: BC Managed Care – PPO | Attending: Adult Health | Admitting: Adult Health

## 2020-06-05 ENCOUNTER — Other Ambulatory Visit: Payer: Self-pay

## 2020-06-05 VITALS — BP 116/83 | HR 89 | Temp 97.6°F | Resp 18 | Ht 63.0 in | Wt 151.9 lb

## 2020-06-05 DIAGNOSIS — Z818 Family history of other mental and behavioral disorders: Secondary | ICD-10-CM | POA: Insufficient documentation

## 2020-06-05 DIAGNOSIS — Z9013 Acquired absence of bilateral breasts and nipples: Secondary | ICD-10-CM | POA: Diagnosis not present

## 2020-06-05 DIAGNOSIS — Z823 Family history of stroke: Secondary | ICD-10-CM | POA: Diagnosis not present

## 2020-06-05 DIAGNOSIS — C50412 Malignant neoplasm of upper-outer quadrant of left female breast: Secondary | ICD-10-CM | POA: Diagnosis present

## 2020-06-05 DIAGNOSIS — Z833 Family history of diabetes mellitus: Secondary | ICD-10-CM | POA: Diagnosis not present

## 2020-06-05 DIAGNOSIS — Z8249 Family history of ischemic heart disease and other diseases of the circulatory system: Secondary | ICD-10-CM | POA: Diagnosis not present

## 2020-06-05 DIAGNOSIS — Z79899 Other long term (current) drug therapy: Secondary | ICD-10-CM | POA: Insufficient documentation

## 2020-06-05 DIAGNOSIS — Z836 Family history of other diseases of the respiratory system: Secondary | ICD-10-CM | POA: Insufficient documentation

## 2020-06-05 DIAGNOSIS — Z820 Family history of epilepsy and other diseases of the nervous system: Secondary | ICD-10-CM | POA: Diagnosis not present

## 2020-06-05 DIAGNOSIS — E039 Hypothyroidism, unspecified: Secondary | ICD-10-CM | POA: Diagnosis not present

## 2020-06-05 DIAGNOSIS — Z171 Estrogen receptor negative status [ER-]: Secondary | ICD-10-CM | POA: Diagnosis not present

## 2020-06-05 NOTE — Progress Notes (Signed)
[facility]  [phone] [phone]    ID: Brittney Vance OB: 10/01/1965  MR#: [id number]  CBJ#:[id number]  Patient Care Team: Brittney Mclean, MD as PCP - General (Family Medicine) Brittney Pickett Nadean Corwin, MD as PCP - Cardiology (Cardiology) Magrinat, Virgie Dad, MD as Consulting Physician (Oncology) Brittney Friendly, MD as Referring Physician (Internal Medicine) Brittney Bunde, MD as Consulting Physician (Obstetrics and Gynecology) Brittney Mires, MD as Consulting Physician (Pulmonary Disease) OTHER MD:   CHIEF COMPLAINT: Left breast cancer, triple negative (s/p bilateral mastectomies)  CURRENT THERAPY:  observation   INTERVAL HISTORY: Brittney Vance returns today for follow-up of her estrogen receptor negative breast cancer. She continues under observation. She has saline implants in place.  In June, Brittney Vance was seen due to persistent intermittent left lower breast discomfort.  She underwent an ultrasound in early July that showed no evidence of a mass or anything of concern in the breast.  She notes her discomfort is still present, but hasn't changed, increased, and she hasn't had any breast changes.    REVIEW OF SYSTEMS: Brittney Vance is doing well today.  She continues to see her PCP, Dr. Lorelei Vance every 6 months and notes she is up to date on colon and gyn cancer screenings.  She denies any new issues today. A detailed ROS was non contributory.    She exercises by walking but admits she probably should be exercising more.  She still struggles some with fear of recurrence.    BREAST CANCER HISTORY:  from the original intake note:  [name] had routine screening mammography 03/09/2013 showing heterogeneously dense breasts, and a possible asymmetry in the left breast. Diagnostic left mammography and ultrasonography at the [facility] Center 03/30/2013 found a lobulated mass in the left breast upper outer quadrant measuring 1.6 cm. This was palpable. Ultrasound showed a complex  microlobulated hypoechoic mass measuring 1.8 cm. There was no left axillary lymphadenopathy noted.  Biopsy of the mass in question 04/06/2013 showed ([id number]) and invasive ductal carcinoma, grade 3, triple negative, with an MIB-1 of 65%.  Bilateral breast MRI 04/13/2013 found an irregular mass measuring 2.5 cm in the left breast with a few lymph nodes that showed moderate cortical thickness, the largest measuring 12 mm (level I).  The patient's subsequent history is as detailed below   PAST MEDICAL HISTORY: Past Medical History:  Diagnosis Date   Breast cancer ([place]) 2015   left, triple negative    Depression 09/23/2014   Fibroids    Hypertension    Hypothyroid    Sleep apnea     PAST SURGICAL HISTORY: Past Surgical History:  Procedure Laterality Date   AXILLARY LYMPH NODE BIOPSY Left 10/30/2013   Procedure: AXILLARY LYMPH NODE BIOPSY;  Surgeon: Brittney Hollingshead, MD;  Location: [facility];  Service: General;  Laterality: Left;   BREAST BIOPSY Left 04/24/2013   BREAST RECONSTRUCTION WITH PLACEMENT OF TISSUE EXPANDER AND FLEX HD (ACELLULAR HYDRATED DERMIS) Bilateral 10/30/2013   Procedure: BILATERAL BREAST RECONSTRUCTION WITH PLACEMENT OF TISSUE EXPANDER AND FLEX HD (ACELLULAR HYDRATED DERMIS);  Surgeon: Brittney Reese, MD;  Location: [facility];  Service: Plastics;  Laterality: Bilateral;   MASTECTOMY     PORT-A-CATH REMOVAL Right 10/30/2013   Procedure: REMOVAL PORT-A-CATH;  Surgeon: Brittney Hollingshead, MD;  Location: [facility];  Service: General;  Laterality: Right;   PORTACATH PLACEMENT Right 04/27/2013   Procedure: ULTRASOUND GUIDED PORT INSERTION WITH FLUOROSCOPY;  Surgeon: Brittney Hollingshead, MD;  Location: [facility];  Service: General;  Laterality:  Right;   TOTAL MASTECTOMY Bilateral 10/30/2013   Procedure: TOTAL MASTECTOMY;  Surgeon: Brittney Hollingshead, MD;  Location: [facility];  Service: General;  Laterality: Bilateral;   TUBAL LIGATION      FAMILY HISTORY Family History  Problem  Relation Age of Onset   Hypertension Mother    Diabetes Mother    Alzheimer's disease Mother    Stroke Mother    Hypertension Father    Diabetes Father    Hypertension Brother    Epilepsy Son 4       being worked up for autism   Sleep apnea Son    Thyroid disease Neg Hx   2 the patient's father died in his 43X from complications of diabetes. The patient's mother passed away in Mar 12, 2018 from stroke. The patient had 2 brothers, and 2 sisters. There is no history of cancer in the family to her knowledge.   GYNECOLOGIC HISTORY:  (updated 10/03/2013)  Menarche age 68, first live birth age 17. The patient is GX P2. She is postmenopausal, but did have some perimenopausal bleeding, which was evaluated age 1 08/14/2012 with a transabdominal and transvaginal ultrasound of the pelvis which found a normal uterine myometrium and left ovary. The right ovary could not be visualized.   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 Her husband Brittney Vance also works for the [employer]. He is currently being treated for prostate cancer. Their children are [name], [place], 10. Her mother passed away recently. The patient attends a [employer].     ADVANCED DIRECTIVES: Not in place   HEALTH MAINTENANCE:  Social History   Tobacco Use   Smoking status: Never Smoker   Smokeless tobacco: Never Used  Vaping Use   Vaping Use: Never used  Substance Use Topics   Alcohol use: Not Currently   Drug use: No     Colonoscopy:- June 2016  PAP: 02/24/2017  Bone density: Never  Lipid panel: 02/15/2018   No Known Allergies  Current Outpatient Medications  Medication Sig Dispense Refill   methocarbamol (ROBAXIN) 500 MG tablet Take 500 mg by mouth 3 (three) times daily as needed for muscle spasms.     acetaminophen (TYLENOL) 650 MG CR tablet Take 650 mg by mouth every 6 (six) hours as needed for pain.      levothyroxine (SYNTHROID) 50 MCG tablet Take 50 mcg by mouth every other day.     SYNTHROID 75 MCG tablet Take 75 mcg by mouth every other day.      No current facility-administered medications for this visit.    OBJECTIVE:   African-American woman in no acute distress  Vitals:   06/05/20 0942  BP: 116/83  Pulse: 89  Resp: 18  Temp: 97.6 F (36.4 C)  SpO2: 97%  Body mass index is 26.91 kg/m.  ECOG: 1 Filed Weights   06/05/20 0942  Weight: 151 lb 14.4 oz (68.9 kg)  GENERAL: Patient is a well appearing female in no acute distress HEENT:  Sclerae anicteric.  Oropharynx clear and moist. No ulcerations or evidence of oropharyngeal candidiasis. Neck is supple.  NODES:  No cervical, supraclavicular, or axillary lymphadenopathy palpated.  BREAST EXAM:  S/p bilateral mastectomies, no sign of local recurrence LUNGS:  Clear to auscultation bilaterally.  No wheezes or rhonchi. HEART:  Regular rate and rhythm. No murmur appreciated. ABDOMEN:  Soft, nontender.  Positive, normoactive bowel sounds. No organomegaly palpated. MSK:  No focal  spinal tenderness to palpation.  EXTREMITIES:  No peripheral edema.   SKIN:  Clear with no obvious rashes or skin changes. No nail dyscrasia. NEURO:  Nonfocal. Well oriented.  Appropriate affect.      LAB RESULTS:  Lab Results  Component Value Date   WBC 6.9 08/13/2019   NEUTROABS 1.7 02/20/2019   HGB 13.8 08/13/2019   HCT 41.0 08/13/2019   MCV 89.8 08/13/2019   PLT 295.0 08/13/2019      Chemistry      Component Value Date/Time   NA 140 03/24/2020 0932   NA 140 05/19/2017 1035   K 4.0 03/24/2020 0932   K 3.5 05/19/2017 1035   CL 102 03/24/2020 0932   CO2 26 03/24/2020 0932   CO2 29 05/19/2017 1035   BUN 11 03/24/2020 0932   BUN 7.0 05/19/2017 1035   CREATININE 0.61 03/24/2020 0932   CREATININE 0.7 05/19/2017 1035      Component Value Date/Time   CALCIUM 9.7 03/24/2020 0932   CALCIUM 9.9 05/19/2017 1035   ALKPHOS 96 08/13/2019 0945     ALKPHOS 91 05/19/2017 1035   AST 23 08/13/2019 0945   AST 22 02/20/2019 1307   AST 21 05/19/2017 1035   ALT 20 08/13/2019 0945   ALT 21 02/20/2019 1307   ALT 16 05/19/2017 1035   BILITOT 0.4 08/13/2019 0945   BILITOT 0.3 02/20/2019 1307   BILITOT 0.39 05/19/2017 1035      STUDIES: No results found.   ASSESSMENT: 54 y.o. BRCA negative Early woman   (1)  status post left breast upper outer quadrant biopsy 04/06/2013 for a clinical T2 N1, stage IIB invasive ductal carcinoma, high-grade, triple negative, with an MIB-1 of 65%  (2) left axillary lymph node biopsy 04/24/2013 negative for malignancy  (3)  treated in the neoadjuvant setting:   (a) completed 4 dose dense cycles of doxorubicin/ cyclophosphamide 06/11/2013,.  (b)  completed 12 weekly cycles of carboplatin/ paclitaxel 09/24/2013  (4)  status post bilateral mastectomies 10/30/2013, showing benign results on the right and a complete pathologic response in the left, including both sentinel lymph nodes clear  (5) status post bilateral breast implant reconstructive surgery  (6) pedunculated fibroid noted March 2016  (7) Right thyroid nodule noted July 2016   PLAN: Brittney Vance is here today for continued observation and surveillance of her triple negative breast cancer.  She has no clinical or radiographic sign of breast cancer recurrence.  I reviewed with her that the further she gets away from her diagnosis the lower her risk of her recurrence is.  She understands this.  We talked about things that Natalea can focus on in her wheel of control, such as heatlhy diet, exercising daily, awareness of her body and any changes, and keeping her appointments with her health care team, following recommendations.  She understands this.    Brittney Vance will return in 6 months for f/u.  She knows to call for any questions that may arise between now and her next appointment.  We are happy to see her sooner if needed.   Total encounter time: 20  minutes*  Wilber Bihari, NP 06/05/20 10:14 AM Medical Oncology and Hematology [facility] [place], [place] [address] Tel. [phone]    Fax. [phone]     *Total Encounter Time as defined by the Centers for Medicare and Medicaid Services includes, in addition to the face-to-face time of a patient visit (documented in the note above) non-face-to-face time: obtaining and reviewing  outside history, ordering and reviewing medications, tests or procedures, care coordination (communications with other health care professionals or caregivers) and documentation in the medical record.

## 2020-06-05 NOTE — Telephone Encounter (Signed)
Scheduled appointment per 11/11 los. Spoke to patient who is aware of appointment date and time.

## 2020-08-11 ENCOUNTER — Ambulatory Visit: Payer: BC Managed Care – PPO | Admitting: Endocrinology

## 2020-08-29 ENCOUNTER — Ambulatory Visit: Payer: BC Managed Care – PPO | Admitting: Endocrinology

## 2020-09-09 ENCOUNTER — Telehealth: Payer: Self-pay | Admitting: Endocrinology

## 2020-09-09 NOTE — Telephone Encounter (Signed)
Pt switched pharmacies and so pharmacy called to request a prescription for Synthroid 50 mcg be sent to them for pt:   [facility] [place], [address] AT [address] [place] Phone:  [phone]  Fax:  [phone]

## 2020-09-10 ENCOUNTER — Other Ambulatory Visit: Payer: Self-pay | Admitting: *Deleted

## 2020-09-10 NOTE — Telephone Encounter (Signed)
Synthroid not prescribe by dr. Loanne Drilling. Please advise

## 2020-09-10 NOTE — Telephone Encounter (Signed)
1.  Please schedule f/u appt 2.  Then please refill x 2 mos, pending that appt.  

## 2020-09-11 ENCOUNTER — Other Ambulatory Visit: Payer: Self-pay | Admitting: *Deleted

## 2020-09-11 ENCOUNTER — Encounter: Payer: Self-pay | Admitting: Family Medicine

## 2020-09-11 DIAGNOSIS — E034 Atrophy of thyroid (acquired): Secondary | ICD-10-CM

## 2020-09-11 NOTE — Telephone Encounter (Signed)
Pt have appt 01/15/21 with Dr. Loanne Drilling. Pending Rx

## 2020-09-11 NOTE — Telephone Encounter (Signed)
Pending until appt 01/15/21

## 2020-09-12 MED ORDER — LEVOTHYROXINE SODIUM 50 MCG PO TABS: 50.0000 ug | ORAL_TABLET | Freq: Every day | ORAL | 6 refills | Status: DC

## 2020-09-13 MED ORDER — SYNTHROID 75 MCG PO TABS: 75.0000 ug | ORAL_TABLET | ORAL | 2 refills | Status: DC

## 2020-09-18 ENCOUNTER — Ambulatory Visit: Payer: BC Managed Care – PPO | Admitting: Family Medicine

## 2020-11-07 ENCOUNTER — Other Ambulatory Visit: Payer: Self-pay | Admitting: Endocrinology

## 2020-12-05 ENCOUNTER — Telehealth: Payer: Self-pay | Admitting: Adult Health

## 2020-12-05 ENCOUNTER — Inpatient Hospital Stay: Payer: Self-pay | Admitting: Adult Health

## 2020-12-05 NOTE — Telephone Encounter (Signed)
R/s appt per 5/13 sch msg. Pt aware.  

## 2021-01-01 ENCOUNTER — Ambulatory Visit: Payer: BC Managed Care – PPO | Admitting: Family Medicine

## 2021-01-15 ENCOUNTER — Ambulatory Visit: Payer: BC Managed Care – PPO | Admitting: Endocrinology

## 2021-02-04 IMAGING — DX DG LUMBAR SPINE COMPLETE 4+V
5 series · 5 of 5 positions shown · non-contrast
Comparison: None.

CLINICAL DATA: Low back pain, no known injury, initial encounter

EXAM:
LUMBAR SPINE - COMPLETE 4+ VIEW

[l-spine ap]
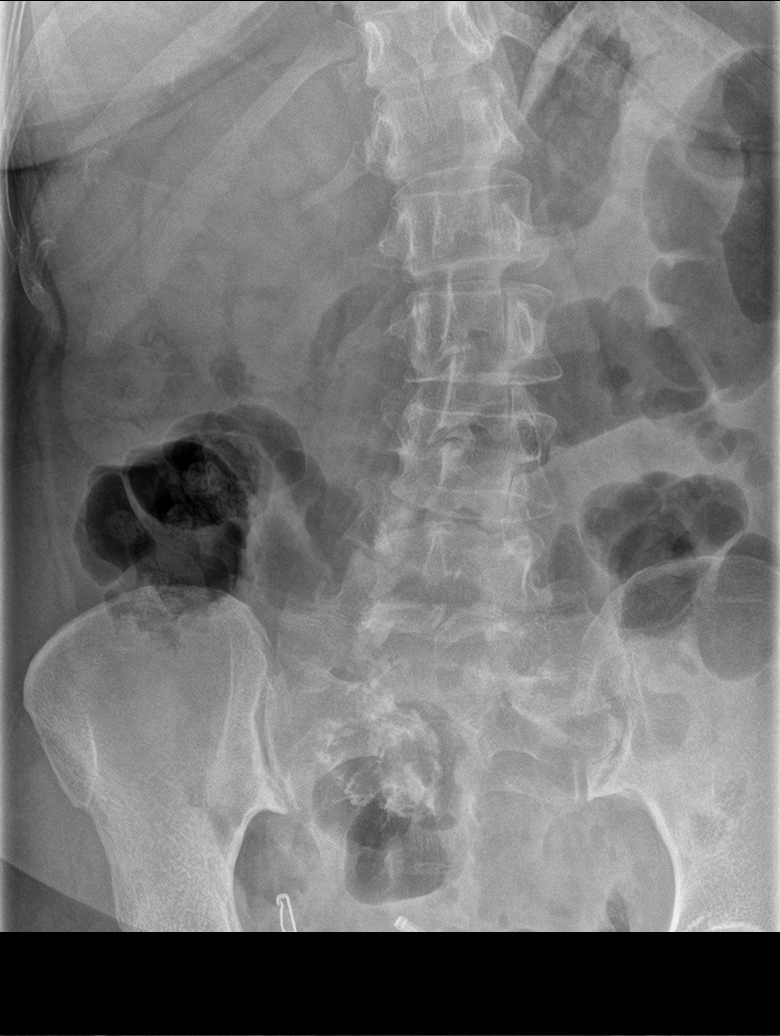

[l-spine obl (1 of 2)]
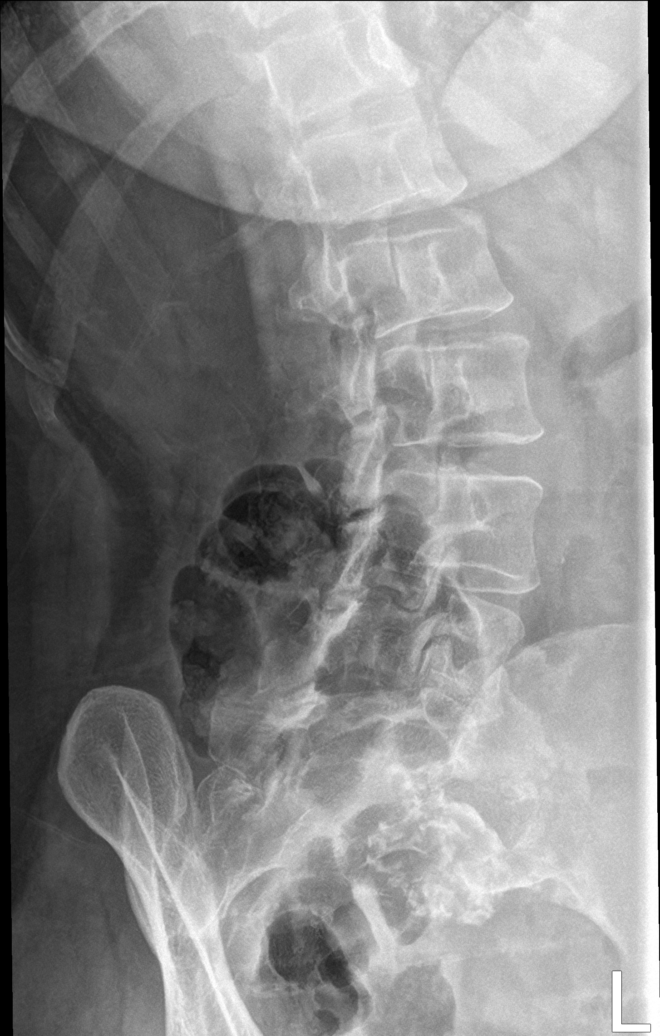

[l-spine lat]
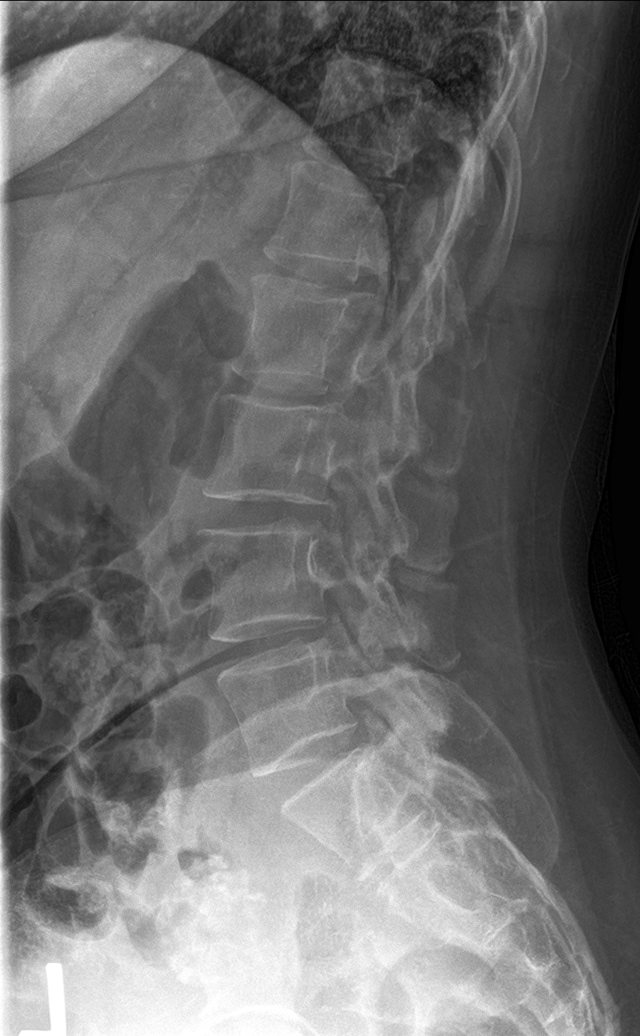

[l-spine spot]
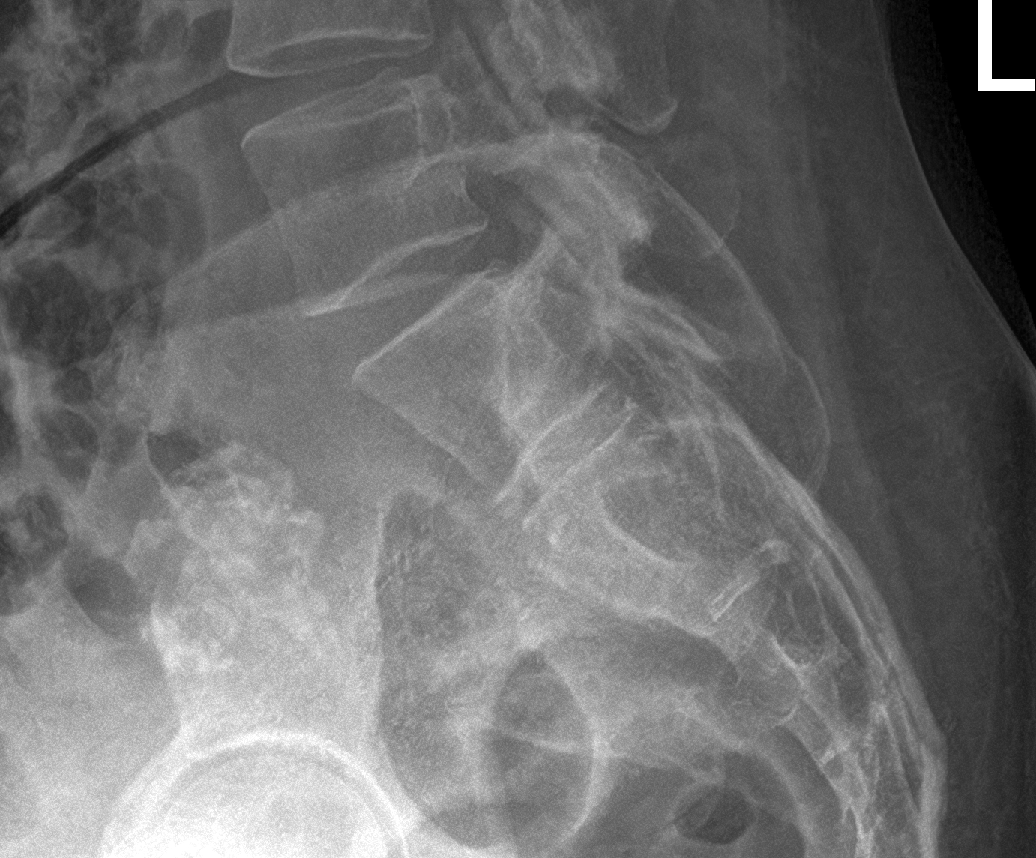

[l-spine obl (2 of 2)]
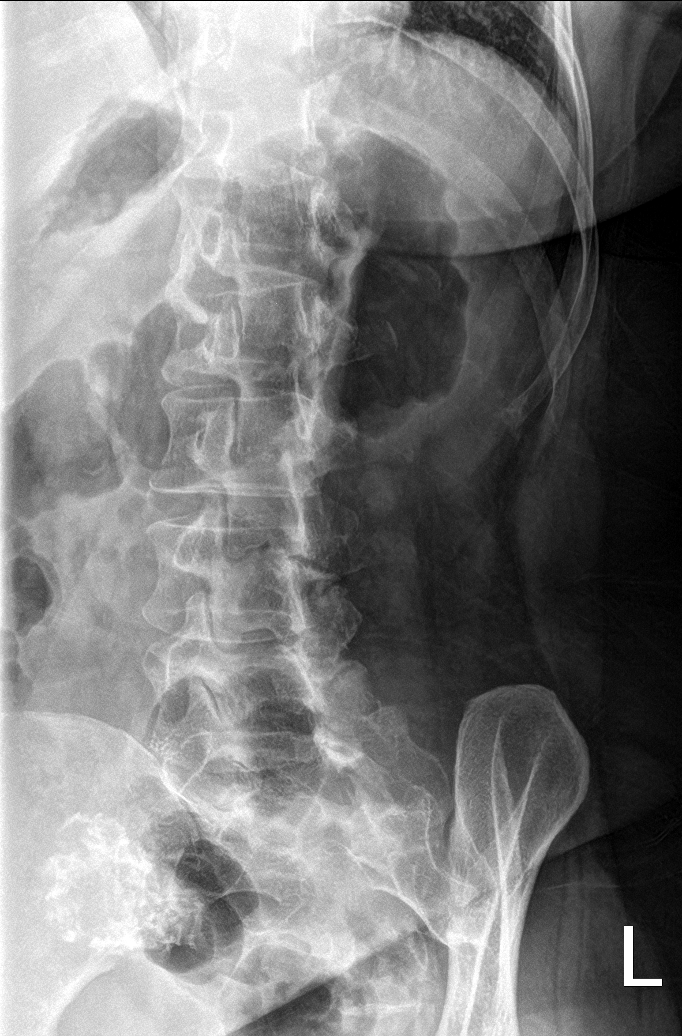

[5 of 5 positions shown; findings below may reference images not displayed]

FINDINGS: Five lumbar type vertebral bodies are well visualized. The fifth
lumbar vertebra is partially sacralized. Vertebral body height is
well maintained. No anterolisthesis is seen. Mild scoliosis concave
to the right is noted. Calcifications within the right ovary are
noted stable from prior CT examination.
IMPRESSION: No acute abnormality noted.

## 2021-02-17 ENCOUNTER — Telehealth: Payer: Self-pay | Admitting: Adult Health

## 2021-02-17 NOTE — Telephone Encounter (Signed)
Scheduled appointment per provider. Patient is aware. 

## 2021-04-23 ENCOUNTER — Other Ambulatory Visit (HOSPITAL_COMMUNITY): Payer: Self-pay

## 2021-04-23 ENCOUNTER — Inpatient Hospital Stay: Payer: BC Managed Care – PPO | Attending: Adult Health | Admitting: Adult Health

## 2021-04-23 ENCOUNTER — Other Ambulatory Visit: Payer: Self-pay

## 2021-04-23 ENCOUNTER — Encounter: Payer: Self-pay | Admitting: Adult Health

## 2021-04-23 VITALS — BP 152/86 | HR 96 | Temp 97.8°F | Resp 18 | Ht 63.0 in | Wt 153.1 lb

## 2021-04-23 DIAGNOSIS — Z8249 Family history of ischemic heart disease and other diseases of the circulatory system: Secondary | ICD-10-CM | POA: Insufficient documentation

## 2021-04-23 DIAGNOSIS — Z833 Family history of diabetes mellitus: Secondary | ICD-10-CM | POA: Insufficient documentation

## 2021-04-23 DIAGNOSIS — Z79899 Other long term (current) drug therapy: Secondary | ICD-10-CM | POA: Insufficient documentation

## 2021-04-23 DIAGNOSIS — C50412 Malignant neoplasm of upper-outer quadrant of left female breast: Secondary | ICD-10-CM

## 2021-04-23 DIAGNOSIS — Z818 Family history of other mental and behavioral disorders: Secondary | ICD-10-CM | POA: Diagnosis not present

## 2021-04-23 DIAGNOSIS — Z823 Family history of stroke: Secondary | ICD-10-CM | POA: Diagnosis not present

## 2021-04-23 DIAGNOSIS — Z9013 Acquired absence of bilateral breasts and nipples: Secondary | ICD-10-CM | POA: Diagnosis not present

## 2021-04-23 DIAGNOSIS — Z820 Family history of epilepsy and other diseases of the nervous system: Secondary | ICD-10-CM | POA: Diagnosis not present

## 2021-04-23 DIAGNOSIS — Z836 Family history of other diseases of the respiratory system: Secondary | ICD-10-CM | POA: Insufficient documentation

## 2021-04-23 DIAGNOSIS — Z171 Estrogen receptor negative status [ER-]: Secondary | ICD-10-CM

## 2021-04-23 MED ORDER — ZOSTER VAC RECOMB ADJUVANTED 50 MCG/0.5ML IM SUSR
0.5000 mL | INTRAMUSCULAR | 1 refills | Status: DC
  Filled 2021-04-23 (×2): qty 0.5, 1d supply, fill #0
  Filled 2021-06-25: qty 0.5, 1d supply, fill #1

## 2021-04-23 NOTE — Progress Notes (Signed)
[place]  [phone] [phone]    ID: Newell Coral Bendickson OB: 05-27-1966  MR#: [id number]  VVZ#:[id number]  Patient Care Team: [name], MD as PCP - General (Family Medicine) Debara Pickett Nadean Corwin, MD as PCP - Cardiology (Cardiology) Magrinat, Virgie Dad, MD as Consulting Physician (Oncology) Eusebio Friendly, MD as Referring Physician (Internal Medicine) Guss Bunde, MD as Consulting Physician (Obstetrics and Gynecology) Chesley Mires, MD as Consulting Physician (Pulmonary Disease) OTHER MD:   CHIEF COMPLAINT: Left breast cancer, triple negative (s/p bilateral mastectomies)  CURRENT THERAPY:  observation   INTERVAL HISTORY: Evanna returns today for follow-up of her estrogen receptor negative breast cancer. She continues under observation. She has saline implants in place.  Javiana denies any new health concerns today.  She is feeling well and is exercising when she can.  She has no breast changes that she has noted and sees her PCP regularly.     REVIEW OF SYSTEMS: Review of Systems  Constitutional:  Negative for appetite change, chills, fatigue, fever and unexpected weight change.  HENT:   Negative for hearing loss, lump/mass and trouble swallowing.   Eyes:  Negative for eye problems and icterus.  Respiratory:  Negative for chest tightness, cough and shortness of breath.   Cardiovascular:  Negative for chest pain, leg swelling and palpitations.  Gastrointestinal:  Negative for abdominal distention, abdominal pain, constipation, diarrhea, nausea and vomiting.  Endocrine: Negative for hot flashes.  Genitourinary:  Negative for difficulty urinating.   Musculoskeletal:  Negative for arthralgias.  Skin:  Negative for itching and rash.  Neurological:  Negative for dizziness, extremity weakness, headaches and numbness.  Hematological:  Negative for adenopathy. Does not bruise/bleed easily.  Psychiatric/Behavioral:  Negative for depression. The  patient is not nervous/anxious.       BREAST CANCER HISTORY:  from the original intake note:  Ethel had routine screening mammography 03/09/2013 showing heterogeneously dense breasts, and a possible asymmetry in the left breast. Diagnostic left mammography and ultrasonography at the [facility] Center 03/30/2013 found a lobulated mass in the left breast upper outer quadrant measuring 1.6 cm. This was palpable. Ultrasound showed a complex microlobulated hypoechoic mass measuring 1.8 cm. There was no left axillary lymphadenopathy noted.  Biopsy of the mass in question 04/06/2013 showed ([id number]) and invasive ductal carcinoma, grade 3, triple negative, with an MIB-1 of 65%.  Bilateral breast MRI 04/13/2013 found an irregular mass measuring 2.5 cm in the left breast with a few lymph nodes that showed moderate cortical thickness, the largest measuring 12 mm (level I).  The patient's subsequent history is as detailed below   PAST MEDICAL HISTORY: Past Medical History:  Diagnosis Date   Breast cancer ([place]) 2015   left, triple negative    Depression 09/23/2014   Fibroids    Hypertension    Hypothyroid    Sleep apnea     PAST SURGICAL HISTORY: Past Surgical History:  Procedure Laterality Date   AXILLARY LYMPH NODE BIOPSY Left 10/30/2013   Procedure: AXILLARY LYMPH NODE BIOPSY;  Surgeon: [name], MD;  Location: [facility];  Service: General;  Laterality: Left;   BREAST BIOPSY Left 04/24/2013   BREAST RECONSTRUCTION WITH PLACEMENT OF TISSUE EXPANDER AND FLEX HD (ACELLULAR HYDRATED DERMIS) Bilateral 10/30/2013   Procedure: BILATERAL BREAST RECONSTRUCTION WITH PLACEMENT OF TISSUE EXPANDER AND FLEX HD (ACELLULAR HYDRATED DERMIS);  Surgeon: [name], MD;  Location: [facility];  Service: Plastics;  Laterality: Bilateral;   MASTECTOMY     PORT-A-CATH REMOVAL  Right 10/30/2013   Procedure: REMOVAL PORT-A-CATH;  Surgeon: [name], MD;  Location: [facility];  Service: General;  Laterality: Right;    PORTACATH PLACEMENT Right 04/27/2013   Procedure: ULTRASOUND GUIDED PORT INSERTION WITH FLUOROSCOPY;  Surgeon: [name], MD;  Location[facility];  Service: General;  Laterality: Right;   TOTAL MASTECTOMY Bilateral 10/30/2013   Procedure: TOTAL MASTECTOMY;  Surgeon: [name], MD;  Location: [facility];  Service: General;  Laterality: Bilateral;   TUBAL LIGATION      FAMILY HISTORY Family History  Problem Relation Age of Onset   Hypertension Mother    Diabetes Mother    Alzheimer's disease Mother    Stroke Mother    Hypertension Father    Diabetes Father    Hypertension Brother    Epilepsy Son 4       being worked up for autism   Sleep apnea Son    Thyroid disease Neg Hx   2 the patient's father died in his 39Q from complications of diabetes. The patient's mother passed away in 03-26-18 from stroke. The patient had 2 brothers, and 2 sisters. There is no history of cancer in the family to her knowledge.   GYNECOLOGIC HISTORY:  (updated 10/03/2013)  Menarche age 1, first live birth age 35. The patient is GX P2. She is postmenopausal, but did have some perimenopausal bleeding, which was evaluated age 53 08/14/2012 with a transabdominal and transvaginal ultrasound of the pelvis which found a normal uterine myometrium and left ovary. The right ovary could not be visualized.   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 Her husband [name] also works for the [employer] system. He is currently being treated for prostate cancer. Their children are [name], [place], 10. Her mother passed away recently. The patient attends a [employer].     ADVANCED DIRECTIVES: Not in place   HEALTH MAINTENANCE:  Social History   Tobacco Use   Smoking status: Never   Smokeless tobacco: Never  Vaping Use   Vaping Use: Never used  Substance Use Topics   Alcohol use: Not Currently   Drug use: No     Colonoscopy:-  June 2016  PAP: 02/24/2017  Bone density: Never  Lipid panel: 02/15/2018   No Known Allergies  Current Outpatient Medications  Medication Sig Dispense Refill   acetaminophen (TYLENOL) 650 MG CR tablet Take 650 mg by mouth every 6 (six) hours as needed for pain.     levothyroxine (SYNTHROID) 50 MCG tablet Take 1 tablet (50 mcg total) by mouth daily. Alternate with 75 mcg 45 tablet 6   levothyroxine (SYNTHROID) 75 MCG tablet TAKE 1 TABLET BY MOUTH EVERY DAY 90 tablet 2   methocarbamol (ROBAXIN) 500 MG tablet Take 500 mg by mouth 3 (three) times daily as needed for muscle spasms.     No current facility-administered medications for this visit.    OBJECTIVE:   African-American woman in no acute distress  Vitals:   04/23/21 1044  BP: (!) 152/86  Pulse: 96  Resp: 18  Temp: 97.8 F (36.6 C)  SpO2: 100%  Body mass index is 27.12 kg/m.  ECOG: 1 Filed Weights   04/23/21 1044  Weight: 153 lb 1.6 oz (69.4 kg)  GENERAL: Patient is a well appearing female in no acute distress HEENT:  Sclerae anicteric.  Oropharynx clear and moist. No ulcerations or evidence of oropharyngeal candidiasis. Neck is supple.  NODES:  No cervical, supraclavicular, or axillary lymphadenopathy palpated.  BREAST EXAM:  S/p bilateral mastectomies, no sign of local recurrence LUNGS:  Clear to auscultation bilaterally.  No wheezes or rhonchi. HEART:  Regular rate and rhythm. No murmur appreciated. ABDOMEN:  Soft, nontender.  Positive, normoactive bowel sounds. No organomegaly palpated. MSK:  No focal spinal tenderness to palpation.  EXTREMITIES:  No peripheral edema.   SKIN:  Clear with no obvious rashes or skin changes. No nail dyscrasia. NEURO:  Nonfocal. Well oriented.  Appropriate affect.      LAB RESULTS:  Lab Results  Component Value Date   WBC 6.9 08/13/2019   NEUTROABS 1.7 02/20/2019   HGB 13.8 08/13/2019   HCT 41.0 08/13/2019   MCV 89.8 08/13/2019   PLT 295.0 08/13/2019      Chemistry       Component Value Date/Time   NA 140 03/24/2020 0932   NA 140 05/19/2017 1035   K 4.0 03/24/2020 0932   K 3.5 05/19/2017 1035   CL 102 03/24/2020 0932   CO2 26 03/24/2020 0932   CO2 29 05/19/2017 1035   BUN 11 03/24/2020 0932   BUN 7.0 05/19/2017 1035   CREATININE 0.61 03/24/2020 0932   CREATININE 0.7 05/19/2017 1035      Component Value Date/Time   CALCIUM 9.7 03/24/2020 0932   CALCIUM 9.9 05/19/2017 1035   ALKPHOS 96 08/13/2019 0945   ALKPHOS 91 05/19/2017 1035   AST 23 08/13/2019 0945   AST 22 02/20/2019 1307   AST 21 05/19/2017 1035   ALT 20 08/13/2019 0945   ALT 21 02/20/2019 1307   ALT 16 05/19/2017 1035   BILITOT 0.4 08/13/2019 0945   BILITOT 0.3 02/20/2019 1307   BILITOT 0.39 05/19/2017 1035      STUDIES: No results found.   ASSESSMENT: 55 y.o. BRCA negative [place] woman   (1)  status post left breast upper outer quadrant biopsy 04/06/2013 for a clinical T2 N1, stage IIB invasive ductal carcinoma, high-grade, triple negative, with an MIB-1 of 65%  (2) left axillary lymph node biopsy 04/24/2013 negative for malignancy  (3)  treated in the neoadjuvant setting:   (a) completed 4 dose dense cycles of doxorubicin/ cyclophosphamide 06/11/2013,.  (b)  completed 12 weekly cycles of carboplatin/ paclitaxel 09/24/2013  (4)  status post bilateral mastectomies 10/30/2013, showing benign results on the right and a complete pathologic response in the left, including both sentinel lymph nodes clear  (5) status post bilateral breast implant reconstructive surgery  (6) pedunculated fibroid noted March 2016  (7) Right thyroid nodule noted July 2016   PLAN: Malli continues on observation for her previous breast cancer.  She has no clinical or radiographic signs of breast cancer recurrence.  She is delighted to hear this.    We reviewed her health maintenance and I discussed my recommendation for her to receive the shingles vaccine.  She plans on going over to the [facility] today to learn more from our pharmacy team and to receive this.   I recommended she continue with healthy diet and exercise.  She will continue to f/u with her PCP for health maintenance needs.  We will see her back in 1 year for continued f/u.  She knows to call for any questions that may arise between now and her next appointment.  We are happy to see her sooner if needed.  Total encounter time: 30 minutes in face to face visit time, order entry, chart review, lab review, care  coordination, and documentation of the encounter.    Wilber Bihari, NP 04/23/21 11:03 AM Medical Oncology and Hematology [facility] [place], [place] [address] Tel. [phone]    Fax. [phone]   *Total Encounter Time as defined by the Centers for Medicare and Medicaid Services includes, in addition to the face-to-face time of a patient visit (documented in the note above) non-face-to-face time: obtaining and reviewing outside history, ordering and reviewing medications, tests or procedures, care coordination (communications with other health care professionals or caregivers) and documentation in the medical record.

## 2021-04-24 ENCOUNTER — Other Ambulatory Visit (HOSPITAL_COMMUNITY): Payer: Self-pay

## 2021-04-24 ENCOUNTER — Encounter: Payer: Self-pay | Admitting: Adult Health

## 2021-05-08 ENCOUNTER — Ambulatory Visit: Payer: Self-pay | Admitting: Endocrinology

## 2021-06-25 ENCOUNTER — Other Ambulatory Visit (HOSPITAL_COMMUNITY): Payer: Self-pay

## 2021-06-25 DIAGNOSIS — K859 Acute pancreatitis without necrosis or infection, unspecified: Secondary | ICD-10-CM

## 2021-06-25 HISTORY — DX: Acute pancreatitis without necrosis or infection, unspecified: K85.90

## 2021-06-26 ENCOUNTER — Other Ambulatory Visit (HOSPITAL_COMMUNITY): Payer: Self-pay

## 2021-07-03 ENCOUNTER — Ambulatory Visit (INDEPENDENT_AMBULATORY_CARE_PROVIDER_SITE_OTHER): Payer: BC Managed Care – PPO | Admitting: Endocrinology

## 2021-07-03 ENCOUNTER — Other Ambulatory Visit: Payer: Self-pay

## 2021-07-03 VITALS — BP 128/88 | HR 97 | Ht 63.0 in | Wt 151.0 lb

## 2021-07-03 DIAGNOSIS — E038 Other specified hypothyroidism: Secondary | ICD-10-CM

## 2021-07-03 LAB — T4, FREE: Free T4: 0.81 ng/dL (ref 0.60–1.60)

## 2021-07-03 LAB — TSH: TSH: 1.4 u[IU]/mL (ref 0.35–5.50)

## 2021-07-03 NOTE — Patient Instructions (Addendum)
Blood tests are requested for you today.  We'll let you know about the results.  It is best to never miss the medication.  However, if you do miss it, next best is to double up the next time.    Please come back for a follow-up appointment in 1 year.

## 2021-07-03 NOTE — Progress Notes (Signed)
Subjective:    Patient ID: Brittney Vance, female    DOB: [birth year], 55 y.o.   MRN: [id number]  HPI Pt returns for f/u of hypothyroidism (dx'ed 2006, in the postpartum state; she has been on synthroid since then; [place] in 2016 showed single right-sided nodule, 9 mm x 7 mm x 9 mm; f/u US in 2018 was unchanged).  pt states she feels well in general.  She takes synthroid as rx'ed.  She does not notice the goiter.   Past Medical History:  Diagnosis Date   Breast cancer ([facility]) 2015   left, triple negative    Depression 09/23/2014   Fibroids    Hypertension    Hypothyroid    Sleep apnea     Past Surgical History:  Procedure Laterality Date   AXILLARY LYMPH NODE BIOPSY Left 10/30/2013   Procedure: AXILLARY LYMPH NODE BIOPSY;  Surgeon: [name], MD;  Location: [facility];  Service: General;  Laterality: Left;   BREAST BIOPSY Left 04/24/2013   BREAST RECONSTRUCTION WITH PLACEMENT OF TISSUE EXPANDER AND FLEX HD (ACELLULAR HYDRATED DERMIS) Bilateral 10/30/2013   Procedure: BILATERAL BREAST RECONSTRUCTION WITH PLACEMENT OF TISSUE EXPANDER AND FLEX HD (ACELLULAR HYDRATED DERMIS);  Surgeon: [name], MD;  Location: [facility];  Service: Plastics;  Laterality: Bilateral;   MASTECTOMY     PORT-A-CATH REMOVAL Right 10/30/2013   Procedure: REMOVAL PORT-A-CATH;  Surgeon: [name], MD;  Location: [facility];  Service: General;  Laterality: Right;   PORTACATH PLACEMENT Right 04/27/2013   Procedure: ULTRASOUND GUIDED PORT INSERTION WITH FLUOROSCOPY;  Surgeon: [name], MD;  [facility];  Service: General;  Laterality: Right;   TOTAL MASTECTOMY Bilateral 10/30/2013   Procedure: TOTAL MASTECTOMY;  Surgeon: [name], MD;  Location: [facility];  Service: General;  Laterality: Bilateral;   TUBAL LIGATION      Social History   Socioeconomic History   Marital status: Married    Spouse name: [name]    Number of children: 2   Years of education: Masters    Highest education level: Not on file   Occupational History   Occupation: Chiropodist: UNEMPLOYED    Comment: [employer]  Tobacco Use   Smoking status: Never   Smokeless tobacco: Never  Vaping Use   Vaping Use: Never used  Substance and Sexual Activity   Alcohol use: Not Currently   Drug use: No   Sexual activity: Yes    Partners: Male    Birth control/protection: Post-menopausal    Comment: menarche age 73, first birth age 27, P2, post menopausal  Other Topics Concern   Not on file  Social History Narrative   1-2 caffeine drinks per day. No regular exercise.   Social Determinants of Health   Financial Resource Strain: Not on file  Food Insecurity: Not on file  Transportation Needs: Not on file  Physical Activity: Not on file  Stress: Not on file  Social Connections: Not on file  Intimate Partner Violence: Not on file    Current Outpatient Medications on File Prior to Visit  Medication Sig Dispense Refill   acetaminophen (TYLENOL) 650 MG CR tablet Take 650 mg by mouth every 6 (six) hours as needed for pain.     levothyroxine (SYNTHROID) 75 MCG tablet TAKE 1 TABLET BY MOUTH EVERY DAY 90 tablet 2   methocarbamol (ROBAXIN) 500 MG tablet Take 500 mg by mouth 3 (three) times daily as needed for muscle spasms.  Zoster Vaccine Adjuvanted [facility]) injection Inject 0.5 mLs into the muscle. 0.5 mL 1   No current facility-administered medications on file prior to visit.    No Known Allergies  Family History  Problem Relation Age of Onset   Hypertension Mother    Diabetes Mother    Alzheimer's disease Mother    Stroke Mother    Hypertension Father    Diabetes Father    Hypertension Brother    Epilepsy Son 4       being worked up for autism   Sleep apnea Son    Thyroid disease Neg Hx     BP 128/88 (BP Location: Right Arm, Patient Position: Sitting, Cuff Size: Normal)   Pulse 97   Ht 5\' 3"  (1.6 m)   Wt 151 lb (68.5 kg)   LMP 03/10/2013 (LMP Unknown)   SpO2 99%   BMI 26.75 kg/m     Review of Systems     Objective:   Physical Exam NECK: thyroid is slightly enlarged, with irreg surface, but no palpable nodule.    Lab Results  Component Value Date   TSH 1.40 07/03/2021      Assessment & Plan:  Hypothyroidism, well-controlled.  Please continue the same synthroid.

## 2021-07-07 ENCOUNTER — Observation Stay (HOSPITAL_COMMUNITY): Payer: BC Managed Care – PPO

## 2021-07-07 ENCOUNTER — Inpatient Hospital Stay (HOSPITAL_BASED_OUTPATIENT_CLINIC_OR_DEPARTMENT_OTHER)
Admission: EM | Admit: 2021-07-07 | Discharge: 2021-07-20 | DRG: 439 | Disposition: A | Discharge status: Home / Self Care | Payer: BC Managed Care – PPO | Attending: Family Medicine | Admitting: Family Medicine

## 2021-07-07 ENCOUNTER — Emergency Department (HOSPITAL_BASED_OUTPATIENT_CLINIC_OR_DEPARTMENT_OTHER): Payer: BC Managed Care – PPO

## 2021-07-07 ENCOUNTER — Encounter (HOSPITAL_BASED_OUTPATIENT_CLINIC_OR_DEPARTMENT_OTHER): Payer: Self-pay | Admitting: *Deleted

## 2021-07-07 ENCOUNTER — Other Ambulatory Visit: Payer: Self-pay

## 2021-07-07 DIAGNOSIS — Z8249 Family history of ischemic heart disease and other diseases of the circulatory system: Secondary | ICD-10-CM

## 2021-07-07 DIAGNOSIS — Z79890 Hormone replacement therapy (postmenopausal): Secondary | ICD-10-CM

## 2021-07-07 DIAGNOSIS — K8021 Calculus of gallbladder without cholecystitis with obstruction: Secondary | ICD-10-CM

## 2021-07-07 DIAGNOSIS — E871 Hypo-osmolality and hyponatremia: Secondary | ICD-10-CM

## 2021-07-07 DIAGNOSIS — C50412 Malignant neoplasm of upper-outer quadrant of left female breast: Secondary | ICD-10-CM

## 2021-07-07 DIAGNOSIS — G4733 Obstructive sleep apnea (adult) (pediatric): Secondary | ICD-10-CM | POA: Diagnosis present

## 2021-07-07 DIAGNOSIS — R739 Hyperglycemia, unspecified: Secondary | ICD-10-CM | POA: Diagnosis present

## 2021-07-07 DIAGNOSIS — C50419 Malignant neoplasm of upper-outer quadrant of unspecified female breast: Secondary | ICD-10-CM | POA: Diagnosis present

## 2021-07-07 DIAGNOSIS — K8020 Calculus of gallbladder without cholecystitis without obstruction: Secondary | ICD-10-CM

## 2021-07-07 DIAGNOSIS — Z9013 Acquired absence of bilateral breasts and nipples: Secondary | ICD-10-CM

## 2021-07-07 DIAGNOSIS — Z853 Personal history of malignant neoplasm of breast: Secondary | ICD-10-CM

## 2021-07-07 DIAGNOSIS — E039 Hypothyroidism, unspecified: Secondary | ICD-10-CM | POA: Diagnosis present

## 2021-07-07 DIAGNOSIS — R319 Hematuria, unspecified: Secondary | ICD-10-CM | POA: Diagnosis not present

## 2021-07-07 DIAGNOSIS — Z20822 Contact with and (suspected) exposure to covid-19: Secondary | ICD-10-CM | POA: Diagnosis present

## 2021-07-07 DIAGNOSIS — Z823 Family history of stroke: Secondary | ICD-10-CM

## 2021-07-07 DIAGNOSIS — E440 Moderate protein-calorie malnutrition: Secondary | ICD-10-CM | POA: Insufficient documentation

## 2021-07-07 DIAGNOSIS — Z780 Asymptomatic menopausal state: Secondary | ICD-10-CM

## 2021-07-07 DIAGNOSIS — K859 Acute pancreatitis without necrosis or infection, unspecified: Secondary | ICD-10-CM | POA: Diagnosis not present

## 2021-07-07 DIAGNOSIS — I10 Essential (primary) hypertension: Secondary | ICD-10-CM

## 2021-07-07 DIAGNOSIS — Z820 Family history of epilepsy and other diseases of the nervous system: Secondary | ICD-10-CM

## 2021-07-07 DIAGNOSIS — Z9882 Breast implant status: Secondary | ICD-10-CM

## 2021-07-07 DIAGNOSIS — Z171 Estrogen receptor negative status [ER-]: Secondary | ICD-10-CM

## 2021-07-07 DIAGNOSIS — D649 Anemia, unspecified: Secondary | ICD-10-CM | POA: Diagnosis present

## 2021-07-07 DIAGNOSIS — N951 Menopausal and female climacteric states: Secondary | ICD-10-CM | POA: Diagnosis present

## 2021-07-07 DIAGNOSIS — K851 Biliary acute pancreatitis without necrosis or infection: Secondary | ICD-10-CM

## 2021-07-07 DIAGNOSIS — E8770 Fluid overload, unspecified: Secondary | ICD-10-CM | POA: Diagnosis present

## 2021-07-07 DIAGNOSIS — F32A Depression, unspecified: Secondary | ICD-10-CM | POA: Diagnosis present

## 2021-07-07 DIAGNOSIS — E876 Hypokalemia: Secondary | ICD-10-CM | POA: Diagnosis present

## 2021-07-07 DIAGNOSIS — R6510 Systemic inflammatory response syndrome (SIRS) of non-infectious origin without acute organ dysfunction: Secondary | ICD-10-CM | POA: Diagnosis present

## 2021-07-07 DIAGNOSIS — R7989 Other specified abnormal findings of blood chemistry: Secondary | ICD-10-CM

## 2021-07-07 DIAGNOSIS — K8511 Biliary acute pancreatitis with uninfected necrosis: Principal | ICD-10-CM | POA: Diagnosis present

## 2021-07-07 DIAGNOSIS — Z833 Family history of diabetes mellitus: Secondary | ICD-10-CM

## 2021-07-07 DIAGNOSIS — K8591 Acute pancreatitis with uninfected necrosis, unspecified: Secondary | ICD-10-CM

## 2021-07-07 DIAGNOSIS — Z6823 Body mass index (BMI) 23.0-23.9, adult: Secondary | ICD-10-CM

## 2021-07-07 DIAGNOSIS — J90 Pleural effusion, not elsewhere classified: Secondary | ICD-10-CM

## 2021-07-07 LAB — URINALYSIS, ROUTINE W REFLEX MICROSCOPIC
Bilirubin Urine: NEGATIVE
Glucose, UA: 100 mg/dL — AB
Hgb urine dipstick: NEGATIVE
Ketones, ur: NEGATIVE mg/dL
Leukocytes,Ua: NEGATIVE
Nitrite: NEGATIVE
Protein, ur: NEGATIVE mg/dL
Specific Gravity, Urine: >1.046 — ABNORMAL HIGH (ref 1.005–1.030)
pH: 7 (ref 5.0–8.0)

## 2021-07-07 LAB — COMPREHENSIVE METABOLIC PANEL
ALT: 186 U/L — ABNORMAL HIGH (ref 0–44)
AST: 96 U/L — ABNORMAL HIGH (ref 15–41)
Albumin: 4.5 g/dL (ref 3.5–5.0)
Alkaline Phosphatase: 136 U/L — ABNORMAL HIGH (ref 38–126)
Anion gap: 11 (ref 5–15)
BUN: 15 mg/dL (ref 6–20)
CO2: 27 mmol/L (ref 22–32)
Calcium: 9.5 mg/dL (ref 8.9–10.3)
Chloride: 102 mmol/L (ref 98–111)
Creatinine, Ser: 0.77 mg/dL (ref 0.44–1.00)
GFR, Estimated: >60 mL/min (ref >60)
Glucose, Bld: 193 mg/dL — ABNORMAL HIGH (ref 70–99)
Potassium: 3.5 mmol/L (ref 3.5–5.1)
Sodium: 140 mmol/L (ref 135–145)
Total Bilirubin: 0.6 mg/dL (ref 0.3–1.2)
Total Protein: 7.7 g/dL (ref 6.5–8.1)

## 2021-07-07 LAB — LIPASE, BLOOD: Lipase: 2627 U/L — ABNORMAL HIGH (ref 11–51)

## 2021-07-07 LAB — CBC
HCT: 43.5 % (ref 36.0–46.0)
Hemoglobin: 14.7 g/dL (ref 12.0–15.0)
MCH: 29.9 pg (ref 26.0–34.0)
MCHC: 33.8 g/dL (ref 30.0–36.0)
MCV: 88.4 fL (ref 80.0–100.0)
Platelets: 292 10*3/uL (ref 150–400)
RBC: 4.92 MIL/uL (ref 3.87–5.11)
RDW: 13.1 % (ref 11.5–15.5)
WBC: 9.2 10*3/uL (ref 4.0–10.5)
nRBC: 0 % (ref 0.0–0.2)

## 2021-07-07 LAB — RESP PANEL BY RT-PCR (FLU A&B, COVID) ARPGX2
Influenza A by PCR: NEGATIVE
Influenza B by PCR: NEGATIVE
SARS Coronavirus 2 by RT PCR: NEGATIVE

## 2021-07-07 MED ORDER — LIDOCAINE VISCOUS HCL 2 % MT SOLN
15.0000 mL | Freq: Once | OROMUCOSAL | Status: DC
  Filled 2021-07-07: qty 15

## 2021-07-07 MED ORDER — ACETAMINOPHEN 325 MG PO TABS
650.0000 mg | ORAL_TABLET | Freq: Four times a day (QID) | ORAL | Status: DC | PRN
  Administered 2021-07-10 – 2021-07-16 (×5): 650 mg via ORAL
  Filled 2021-07-07 (×5): qty 2

## 2021-07-07 MED ORDER — ACETAMINOPHEN 650 MG RE SUPP: 650.0000 mg | Freq: Four times a day (QID) | RECTAL | Status: DC | PRN

## 2021-07-07 MED ORDER — ENOXAPARIN SODIUM 40 MG/0.4ML IJ SOSY
40.0000 mg | PREFILLED_SYRINGE | INTRAMUSCULAR | Status: DC
  Administered 2021-07-07 – 2021-07-19 (×13): 40 mg via SUBCUTANEOUS
  Filled 2021-07-07 (×13): qty 0.4

## 2021-07-07 MED ORDER — ALUM & MAG HYDROXIDE-SIMETH 200-200-20 MG/5ML PO SUSP
30.0000 mL | ORAL | Status: DC | PRN
  Administered 2021-07-07 – 2021-07-12 (×6): 30 mL via ORAL
  Filled 2021-07-07 (×6): qty 30

## 2021-07-07 MED ORDER — ONDANSETRON HCL 4 MG/2ML IJ SOLN
4.0000 mg | Freq: Once | INTRAMUSCULAR | Status: AC
  Administered 2021-07-07: 4 mg via INTRAVENOUS
  Filled 2021-07-07: qty 2

## 2021-07-07 MED ORDER — SODIUM CHLORIDE 0.9 % IV SOLN
12.5000 mg | Freq: Four times a day (QID) | INTRAVENOUS | Status: DC | PRN
  Administered 2021-07-08: 04:00:00 12.5 mg via INTRAVENOUS
  Filled 2021-07-07 (×3): qty 0.5
  Filled 2021-07-07: qty 12.5

## 2021-07-07 MED ORDER — SODIUM CHLORIDE 0.9 % IV SOLN
6.0000 mg | Freq: Once | INTRAVENOUS | Status: AC
  Administered 2021-07-07: 6 mg via INTRAVENOUS
  Filled 2021-07-07: qty 3

## 2021-07-07 MED ORDER — SODIUM CHLORIDE 0.9 % IV SOLN: 1000.0000 mL | INTRAVENOUS | Status: DC

## 2021-07-07 MED ORDER — SODIUM CHLORIDE 0.9 % IV SOLN
1000.0000 mL | INTRAVENOUS | Status: DC
  Administered 2021-07-07 – 2021-07-08 (×4): 1000 mL via INTRAVENOUS

## 2021-07-07 MED ORDER — SODIUM CHLORIDE 0.9 % IV BOLUS (SEPSIS)
1000.0000 mL | Freq: Once | INTRAVENOUS | Status: AC
  Administered 2021-07-07: 1000 mL via INTRAVENOUS

## 2021-07-07 MED ORDER — PROMETHAZINE HCL 25 MG/ML IJ SOLN
INTRAMUSCULAR | Status: AC
  Administered 2021-07-07: 12.5 mg
  Filled 2021-07-07: qty 1

## 2021-07-07 MED ORDER — ALUM & MAG HYDROXIDE-SIMETH 200-200-20 MG/5ML PO SUSP
30.0000 mL | Freq: Once | ORAL | Status: DC
  Filled 2021-07-07: qty 30

## 2021-07-07 MED ORDER — SODIUM CHLORIDE 0.9 % IV BOLUS
1000.0000 mL | Freq: Once | INTRAVENOUS | Status: AC
  Administered 2021-07-07: 1000 mL via INTRAVENOUS

## 2021-07-07 MED ORDER — HYDROMORPHONE HCL 1 MG/ML IJ SOLN
0.5000 mg | INTRAMUSCULAR | Status: DC | PRN
  Administered 2021-07-07 – 2021-07-08 (×2): 0.5 mg via INTRAVENOUS
  Filled 2021-07-07 (×3): qty 0.5

## 2021-07-07 MED ORDER — HYDRALAZINE HCL 20 MG/ML IJ SOLN
10.0000 mg | Freq: Three times a day (TID) | INTRAMUSCULAR | Status: DC | PRN
  Administered 2021-07-10 – 2021-07-11 (×3): 10 mg via INTRAVENOUS
  Filled 2021-07-07 (×4): qty 1

## 2021-07-07 MED ORDER — PROCHLORPERAZINE EDISYLATE 10 MG/2ML IJ SOLN
10.0000 mg | Freq: Once | INTRAMUSCULAR | Status: AC
  Administered 2021-07-07: 10 mg via INTRAVENOUS
  Filled 2021-07-07: qty 2

## 2021-07-07 MED ORDER — HYDROMORPHONE HCL 1 MG/ML IJ SOLN
0.5000 mg | Freq: Once | INTRAMUSCULAR | Status: AC
  Administered 2021-07-07: 0.5 mg via INTRAVENOUS
  Filled 2021-07-07: qty 1

## 2021-07-07 MED ORDER — INFLUENZA VAC SPLIT QUAD 0.5 ML IM SUSY: 0.5000 mL | PREFILLED_SYRINGE | INTRAMUSCULAR | Status: DC

## 2021-07-07 MED ORDER — HYOSCYAMINE SULFATE 0.125 MG SL SUBL
0.2500 mg | SUBLINGUAL_TABLET | Freq: Once | SUBLINGUAL | Status: AC
  Administered 2021-07-07: 0.25 mg via SUBLINGUAL
  Filled 2021-07-07: qty 2

## 2021-07-07 MED ORDER — CHLORHEXIDINE GLUCONATE CLOTH 2 % EX PADS
6.0000 | MEDICATED_PAD | Freq: Every day | CUTANEOUS | Status: DC
  Administered 2021-07-08 – 2021-07-20 (×13): 6 via TOPICAL

## 2021-07-07 MED ORDER — IOHEXOL 300 MG/ML  SOLN
100.0000 mL | Freq: Once | INTRAMUSCULAR | Status: AC | PRN
  Administered 2021-07-07: 100 mL via INTRAVENOUS

## 2021-07-07 MED ORDER — ONDANSETRON HCL 4 MG/2ML IJ SOLN
INTRAMUSCULAR | Status: AC
  Filled 2021-07-07: qty 4

## 2021-07-07 NOTE — H&P (Signed)
History and Physical    Brittney Vance ASN:[id number] DOB: 1966-02-24 DOA: 07/07/2021  PCP: [name], MD  Patient coming from: Home  Chief Complaint: stomach pain, N/V  HPI: Brittney Vance is a 55 y.o. female with medical history significant of breast CA, OSA, hypothyroidism. Presenting N/V, stomach pain. Acute onset of symptoms yesterday. Her stomach pain was across the umbilicus and LUQ. She had N/V. She tried Copywriter, advertising for help, but it didn't do anything for her. Eating made things worse. She was unable to keep anything now. She didn't have fever or diarrhea. When her symptoms didn't resolve she went to the ED last night.   ED Course: CT showed acute pancreatitis. No cyst or necrosis seen. Also noted cholelithiasis, but no acute cholecystitis. No bile duct dilation. She was started on fluids, pain control and anti-emetics. TR[facility] was called for admission.   Review of Systems: Review of systems is otherwise negative for all not mentioned in HPI.   PMHx Past Medical History:  Diagnosis Date   Breast cancer ([facility]) 2015   left, triple negative    Depression 09/23/2014   Fibroids    Hypertension    Hypothyroid    Sleep apnea     PSHx Past Surgical History:  Procedure Laterality Date   AXILLARY LYMPH NODE BIOPSY Left 10/30/2013   Procedure: AXILLARY LYMPH NODE BIOPSY;  Surgeon: [name], MD;  Location: [facility];  Service: General;  Laterality: Left;   BREAST BIOPSY Left 04/24/2013   BREAST RECONSTRUCTION WITH PLACEMENT OF TISSUE EXPANDER AND FLEX HD (ACELLULAR HYDRATED DERMIS) Bilateral 10/30/2013   Procedure: BILATERAL BREAST RECONSTRUCTION WITH PLACEMENT OF TISSUE EXPANDER AND FLEX HD (ACELLULAR HYDRATED DERMIS);  Surgeon: [name], MD;  Location: [facility];  Service: Plastics;  Laterality: Bilateral;   MASTECTOMY     PORT-A-CATH REMOVAL Right 10/30/2013   Procedure: REMOVAL PORT-A-CATH;  Surgeon: [name], MD;  Location: [facility];  Service: General;  Laterality: Right;    PORTACATH PLACEMENT Right 04/27/2013   Procedure: ULTRASOUND GUIDED PORT INSERTION WITH FLUOROSCOPY;  Surgeon: [name], MD;  [facility];  Service: General;  Laterality: Right;   TOTAL MASTECTOMY Bilateral 10/30/2013   Procedure: TOTAL MASTECTOMY;  Surgeon: [name], MD;  Location: [facility];  Service: General;  Laterality: Bilateral;   TUBAL LIGATION      SocHx  reports that she has never smoked. She has never used smokeless tobacco. She reports that she does not currently use alcohol. She reports that she does not use drugs.  No Known Allergies  FamHx Family History  Problem Relation Age of Onset   Hypertension Mother    Diabetes Mother    Alzheimer's disease Mother    Stroke Mother    Hypertension Father    Diabetes Father    Hypertension Brother    Epilepsy Son 4       being worked up for autism   Sleep apnea Son    Thyroid disease Neg Hx     Prior to Admission medications   Medication Sig Start Date End Date Taking? Authorizing Provider  acetaminophen (TYLENOL) 650 MG CR tablet Take 650 mg by mouth every 6 (six) hours as needed for pain.   Yes [provider]  levothyroxine (SYNTHROID) 75 MCG tablet TAKE 1 TABLET BY MOUTH EVERY DAY Patient taking differently: Take 75 mcg by mouth daily before breakfast. 11/08/20  Yes [name], MD  methocarbamol (ROBAXIN) 500 MG tablet Take 500 mg  by mouth 3 (three) times daily as needed for muscle spasms.   Yes [provider]  Zoster Vaccine Adjuvanted [facility]) injection Inject 0.5 mLs into the muscle. 04/23/21   Carlyle Basques, MD    Physical Exam: Vitals:   07/07/21 1315 07/07/21 1353 07/07/21 1402 07/07/21 1502  BP:   (!) 146/74 (!) 170/93  Pulse: 98  99 100  Resp: (!) 29 (!) 30 (!) 24 (!) 24  Temp:   98.4 F (36.9 C) 98.4 F (36.9 C)  TempSrc:      SpO2: 96%  98% 97%  Weight:      Height:        General: 55 y.o. female resting in bed in NAD Eyes: PERRL, normal sclera ENMT:  Nares patent w/o discharge, orophaynx clear, dentition normal, ears w/o discharge/lesions/ulcers Neck: Supple, trachea midline Cardiovascular: RRR, +S1, S2, no m/g/r, equal pulses throughout Respiratory: CTABL, no w/r/r, normal WOB GI: BS+, ND, TTP LUQ, no masses noted, no organomegaly noted MSK: No e/c/c Neuro: A&O x 3, no focal deficits Psyc: Appropriate interaction and affect, calm/cooperative  Labs on Admission: I have personally reviewed following labs and imaging studies  CBC: Recent Labs  Lab 07/07/21 0236  WBC 9.2  HGB 14.7  HCT 43.5  MCV 88.4  PLT 295   Basic Metabolic Panel: Recent Labs  Lab 07/07/21 0236  NA 140  K 3.5  CL 102  CO2 27  GLUCOSE 193*  BUN 15  CREATININE 0.77  CALCIUM 9.5   GFR: Estimated Creatinine Clearance: 65 mL/min (by C-G formula based on SCr of 0.77 mg/dL). Liver Function Tests: Recent Labs  Lab 07/07/21 0236  AST 96*  ALT 186*  ALKPHOS 136*  BILITOT 0.6  PROT 7.7  ALBUMIN 4.5   Recent Labs  Lab 07/07/21 0236  LIPASE 2,627*   No results for input(s): AMMONIA in the last 168 hours. Coagulation Profile: No results for input(s): INR, PROTIME in the last 168 hours. Cardiac Enzymes: No results for input(s): CKTOTAL, CKMB, CKMBINDEX, TROPONINI in the last 168 hours. BNP (last 3 results) No results for input(s): PROBNP in the last 8760 hours. HbA1C: No results for input(s): HGBA1C in the last 72 hours. CBG: No results for input(s): GLUCAP in the last 168 hours. Lipid Profile: No results for input(s): CHOL, HDL, LDLCALC, TRIG, CHOLHDL, LDLDIRECT in the last 72 hours. Thyroid Function Tests: No results for input(s): TSH, T4TOTAL, FREET4, T3FREE, THYROIDAB in the last 72 hours. Anemia Panel: No results for input(s): VITAMINB12, FOLATE, FERRITIN, TIBC, IRON, RETICCTPCT in the last 72 hours. Urine analysis:    Component Value Date/Time   COLORURINE COLORLESS (A) 07/07/2021 0612   APPEARANCEUR CLEAR 07/07/2021 0612   LABSPEC  >1.046 (H) 07/07/2021 0612   PHURINE 7.0 07/07/2021 0612   GLUCOSEU 100 (A) 07/07/2021 0612   HGBUR NEGATIVE 07/07/2021 0612   BILIRUBINUR NEGATIVE 07/07/2021 0612   BILIRUBINUR neg 09/03/2014 1715   KETONESUR NEGATIVE 07/07/2021 0612   PROTEINUR NEGATIVE 07/07/2021 0612   UROBILINOGEN 0.2 12/04/2018 0903   NITRITE NEGATIVE 07/07/2021 0612   LEUKOCYTESUR NEGATIVE 07/07/2021 0612    Radiological Exams on Admission: CT ABDOMEN PELVIS W CONTRAST  Result Date: 07/07/2021 CLINICAL DATA:  Acute pancreatitis. EXAM: CT ABDOMEN AND PELVIS WITH CONTRAST TECHNIQUE: Multidetector CT imaging of the abdomen and pelvis was performed using the standard protocol following bolus administration of intravenous contrast. CONTRAST:  139mL OMNIPAQUE IOHEXOL 300 MG/ML  SOLN COMPARISON:  09/27/2014 FINDINGS: Lower chest:  Small sliding hiatal hernia. Hepatobiliary: No  focal liver abnormality.Gas containing gallstones which are multiple. No evidence of acute cholecystitis. No bile duct dilatation. Pancreas: Pancreas expansion and peripancreatic edema. No necrosis or organized collection. Spleen: Unremarkable. Adrenals/Urinary Tract: Negative adrenals. No hydronephrosis or stone. Unremarkable bladder. Stomach/Bowel: No obstruction. No appendicitis. Left colonic diverticulosis. Vascular/Lymphatic: No acute vascular abnormality. No mass or adenopathy. Reproductive:Partially calcified mass in the right pelvis measuring up to 3.6 cm. There is an apparent stalk to the fundus of the uterus and calcified pedunculated fibroid is again favored. Other: Small volume ascites again considered reactive. Musculoskeletal: No acute abnormalities. Scoliosis and spinal degeneration. IMPRESSION: 1. Acute edematous pancreatitis without collection. 2. Cholelithiasis. Electronically Signed   By: Jorje Guild M.D.   On: 07/07/2021 04:57    EKG: None obtained in ED  Assessment/Plan Acute pancreatitis     - placed in obs, tele     - continue  fluids, anti-emetics, pain control     - no evidence of CBD dilation; T bili is ok     - denies EtOH history     - NPO except ice chips  Elevated LFTs     - AST/ALT elevated     - check hepatitis panel     - liver is unremarkable on CT  Cholelithiasis     - check RUQ [place]  Hypothyroidism     - continue synthroid when she can tolerate PO  DVT prophylaxis: lovenox  Code Status: FULL  Family Communication: None at bedside  Consults called: None   Status is: Observation  The patient remains OBS appropriate and will d/c before 2 midnights.  Jonnie Finner DO [facility]  If 7PM-7AM, please contact night-coverage [email or web address]  07/07/2021, 4:14 PM

## 2021-07-07 NOTE — ED Triage Notes (Signed)
Generalized abdominal pain started around 1430 yesterday, has had several episodes of vomiting. No diarrhea.

## 2021-07-07 NOTE — ED Notes (Signed)
Pt vomiting at this time 

## 2021-07-07 NOTE — ED Notes (Signed)
Pt reports nausea to be improved

## 2021-07-07 NOTE — ED Provider Notes (Signed)
[facility] EMERGENCY DEPT Provider Note  CSN: [id number] Arrival date & time: 07/07/21 0226  Chief Complaint(s) Abdominal Pain  HPI Brittney Vance is a 55 y.o. 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, triple negative    Depression 09/23/2014   Fibroids    Hypertension    Hypothyroid    Sleep apnea    Patient Active Problem List   Diagnosis Date Noted   Goiter 02/06/2015   Fibroid, uterine 10/03/2014   Pain in joint, pelvic region and thigh 09/23/2014   Depression 09/23/2014   Genital herpes 03/15/2014   Hypokalemia 10/03/2013   Anemia associated with chemotherapy 09/17/2013   Neutrophils decreased ([place]) 08/13/2013   Malignant neoplasm of upper-outer quadrant of left breast in female, estrogen receptor negative ([place]) 04/26/2013   Hypothyroidism 01/28/2013   Hypertension 01/25/2013   Perimenopause 01/25/2013   Home Medication(s) Prior to Admission medications   Medication Sig Start Date End Date Taking? Authorizing Provider  acetaminophen (TYLENOL) 650 MG CR tablet Take 650 mg by mouth every 6 (six) hours as needed for pain.    [provider]  levothyroxine (SYNTHROID) 75 MCG tablet TAKE 1 TABLET BY MOUTH EVERY DAY 11/08/20   [name], MD  methocarbamol (ROBAXIN) 500 MG tablet Take 500 mg by mouth 3 (three) times daily as needed for muscle spasms.    [provider]   Zoster Vaccine Adjuvanted [facility]) injection Inject 0.5 mLs into the muscle. 04/23/21   Carlyle Basques, MD                                                                                                                                    Past Surgical History Past Surgical History:  Procedure Laterality Date   AXILLARY LYMPH NODE BIOPSY Left 10/30/2013   Procedure: AXILLARY LYMPH NODE BIOPSY;  Surgeon: [name], MD;  Location: [facility];  Service: General;  Laterality: Left;   BREAST BIOPSY Left 04/24/2013   BREAST RECONSTRUCTION WITH PLACEMENT OF TISSUE EXPANDER AND FLEX HD (ACELLULAR HYDRATED DERMIS) Bilateral 10/30/2013   Procedure: BILATERAL BREAST RECONSTRUCTION WITH PLACEMENT OF TISSUE EXPANDER AND FLEX HD (ACELLULAR HYDRATED DERMIS);  Surgeon: [name], MD;  Location: [facility];  Service: Plastics;  Laterality: Bilateral;   MASTECTOMY     PORT-A-CATH REMOVAL Right 10/30/2013   Procedure: REMOVAL PORT-A-CATH;  Surgeon: [name], MD;  Location: [facility];  Service: General;  Laterality: Right;   PORTACATH PLACEMENT Right 04/27/2013   Procedure: ULTRASOUND GUIDED PORT INSERTION WITH FLUOROSCOPY;  Surgeon: [name], MD;  Location: [facility];  Service: General;  Laterality: Right;   TOTAL MASTECTOMY Bilateral 10/30/2013   Procedure: TOTAL MASTECTOMY;  Surgeon: [name], MD;  Location: [facility];  Service: General;  Laterality: Bilateral;   TUBAL LIGATION     Family History Family History  Problem Relation Age of Onset   Hypertension Mother    Diabetes Mother    Alzheimer's disease Mother    Stroke Mother    Hypertension Father    Diabetes Father    Hypertension Brother    Epilepsy Son 4       being worked up for autism   Sleep apnea Son    Thyroid disease Neg Hx     Social History Social History   Tobacco Use   Smoking status: Never   Smokeless tobacco: Never  Vaping Use   Vaping Use: Never used  Substance Use Topics   Alcohol use: Not Currently   Drug  use: No   Allergies Patient has no known allergies.  Review of Systems Review of Systems  Constitutional:  Negative for chills and fever.  Respiratory:  Negative for cough and shortness of breath.   Cardiovascular:  Negative for chest pain.  Gastrointestinal:  Positive for abdominal pain, nausea and vomiting. Negative for diarrhea.  Genitourinary:  Negative for hematuria.  All other systems are reviewed and are negative for acute change except as noted in the HPI  Physical Exam Vital Signs  I have reviewed the triage vital signs BP (!) 145/90   Pulse 94   Temp 98.2 F (36.8 C) (Oral)   Resp 17   Ht 5\' 3"  (1.6 m)   Wt 61.2 kg   LMP 03/10/2013 (LMP Unknown)   SpO2 98%   BMI 23.91 kg/m   Physical Exam Vitals reviewed.  Constitutional:      General: She is not in acute distress.    Appearance: She is well-developed. She is not diaphoretic.  HENT:     Head: Normocephalic and atraumatic.     Right Ear: External ear normal.     Left Ear: External ear normal.     Nose: Nose normal.  Eyes:     General: No scleral icterus.    Conjunctiva/sclera: Conjunctivae normal.  Neck:     Trachea: Phonation normal.  Cardiovascular:     Rate and Rhythm: Normal rate and regular rhythm.  Pulmonary:     Effort: Pulmonary effort is normal. No respiratory distress.     Breath sounds: No stridor.  Abdominal:     General: There is no distension.     Tenderness: There is abdominal tenderness in the epigastric area and left upper quadrant. There is no guarding or rebound.  Musculoskeletal:        General: Normal range of motion.     Cervical back: Normal range of motion.  Neurological:     Mental Status: She is alert and oriented to person, place, and time.  Psychiatric:        Behavior: Behavior normal.    ED Results and Treatments Labs (all labs ordered are listed, but only abnormal results are displayed) Labs Reviewed  LIPASE, BLOOD - Abnormal; Notable for the following  components:      Result Value   Lipase 2,627 (*)  All other components within normal limits  COMPREHENSIVE METABOLIC PANEL - Abnormal; Notable for the following components:   Glucose, Bld 193 (*)    AST 96 (*)    ALT 186 (*)    Alkaline Phosphatase 136 (*)    All other components within normal limits  CBC  URINALYSIS, ROUTINE W REFLEX MICROSCOPIC                                                                                                                         EKG  EKG Interpretation  Date/Time:    Ventricular Rate:    PR Interval:    QRS Duration:   QT Interval:    QTC Calculation:   R Axis:     Text Interpretation:         Radiology No results found.  Pertinent labs & imaging results that were available during my care of the patient were reviewed by me and considered in my medical decision making (see MDM for details).  Medications Ordered in ED Medications  alum & mag hydroxide-simeth (MAALOX/MYLANTA) 200-200-20 MG/5ML suspension 30 mL (30 mLs Oral Not Given 07/07/21 0342)    And  lidocaine (XYLOCAINE) 2 % viscous mouth solution 15 mL (15 mLs Oral Not Given 07/07/21 0342)  sodium chloride 0.9 % bolus 1,000 mL (has no administration in time range)    Followed by  sodium chloride 0.9 % bolus 1,000 mL (has no administration in time range)    Followed by  0.9 %  sodium chloride infusion (has no administration in time range)  prochlorperazine (COMPAZINE) injection 10 mg (10 mg Intravenous Given 07/07/21 0306)  sodium chloride 0.9 % bolus 1,000 mL (1,000 mLs Intravenous New Bag/Given 07/07/21 0304)  hyoscyamine (LEVSIN SL) SL tablet 0.25 mg (0.25 mg Sublingual Given 07/07/21 0310)  HYDROmorphone (DILAUDID) injection 0.5 mg (0.5 mg Intravenous Given 07/07/21 0409)                                                                                                                                     Procedures .1-3 Lead EKG Interpretation Performed by: Fatima Blank, MD Authorized by: Fatima Blank, MD     Interpretation: normal     ECG rate:  90   ECG rate assessment: normal     Rhythm: sinus rhythm     Ectopy: none     Conduction: normal  Ultrasound ED Abd  Date/Time: 07/07/2021 4:13 AM Performed by: Fatima Blank, MD Authorized by: Fatima Blank, MD   Procedure details:    Indications: abdominal pain     Assessment for:  Gallstones   Hepatobiliary:  Visualized        Hepatobiliary findings:    Gallbladder wall:  Abnormal   Gallbladder wall thickened (>3 mm): yes     Gallbladder stones: identified     Intra-abdominal fluid: identified     Sonographic Murphy's sign: negative   .Critical Care Performed by: Fatima Blank, MD Authorized by: Fatima Blank, MD   Critical care provider statement:    Critical care time (minutes):  45   Critical care time was exclusive of:  Separately billable procedures and treating other patients   Critical care was necessary to treat or prevent imminent or life-threatening deterioration of the following conditions:  Dehydration   Critical care was time spent personally by me on the following activities:  Development of treatment plan with patient or surrogate, discussions with consultants, evaluation of patient's response to treatment, examination of patient, obtaining history from patient or surrogate, review of old charts, re-evaluation of patient's condition, pulse oximetry, ordering and review of radiographic studies, ordering and review of laboratory studies and ordering and performing treatments and interventions   Care discussed with: admitting provider    (including critical care time)  Medical Decision Making / ED Course I have reviewed the nursing notes for this encounter and the patient's prior records (if available in EHR or on provided paperwork).  Brittney Vance was evaluated in Emergency Department on 07/07/2021 for the symptoms described  in the history of present illness. She was evaluated in the context of the global COVID-19 pandemic, which necessitated consideration that the patient might be at risk for infection with the SARS-CoV-2 virus that causes COVID-19. Institutional protocols and algorithms that pertain to the evaluation of patients at risk for COVID-19 are in a state of rapid change based on information released by regulatory bodies including the CDC and federal and state organizations. These policies and algorithms were followed during the patient's care in the ED.     Upper abd pain Postprandial Will rule out pancreatis, biliary disease. Possible food poisoning. IVF and antiemetic given. Cbc w/o leukocytosis CMP with evidence of biliary obstruction and lipase elevated - concerning for gallstone pancreatitis. POCUS is consistent with cholelithiasis and possible acute cholecystitis.  No formal US here, sent for CT confirmation. IV pain meds given. High volume IVF boluses for pancreatitis. CT confirmed pancreatitis with peripancreatic fluid.  Noted to have cholelithiasis with gas containing stones.  No evidence of acute cholecystitis. Patient admitted to medicine for continued work-up and management.   Pertinent labs & imaging results that were available during my care of the patient were reviewed by me and considered in my medical decision making:    Final Clinical Impression(s) / ED Diagnoses Final diagnoses:  Acute biliary pancreatitis without infection or necrosis  Calculus of gallbladder with biliary obstruction but without cholecystitis     This chart was dictated using voice recognition software.  Despite best efforts to proofread,  errors can occur which can change the documentation meaning.    Fatima Blank, MD 07/07/21 [phone]

## 2021-07-08 DIAGNOSIS — R319 Hematuria, unspecified: Secondary | ICD-10-CM | POA: Diagnosis not present

## 2021-07-08 DIAGNOSIS — E876 Hypokalemia: Secondary | ICD-10-CM | POA: Diagnosis not present

## 2021-07-08 DIAGNOSIS — K8591 Acute pancreatitis with uninfected necrosis, unspecified: Secondary | ICD-10-CM | POA: Diagnosis not present

## 2021-07-08 DIAGNOSIS — Z8249 Family history of ischemic heart disease and other diseases of the circulatory system: Secondary | ICD-10-CM | POA: Diagnosis not present

## 2021-07-08 DIAGNOSIS — Z820 Family history of epilepsy and other diseases of the nervous system: Secondary | ICD-10-CM | POA: Diagnosis not present

## 2021-07-08 DIAGNOSIS — R739 Hyperglycemia, unspecified: Secondary | ICD-10-CM | POA: Diagnosis present

## 2021-07-08 DIAGNOSIS — K8020 Calculus of gallbladder without cholecystitis without obstruction: Secondary | ICD-10-CM | POA: Diagnosis present

## 2021-07-08 DIAGNOSIS — R6510 Systemic inflammatory response syndrome (SIRS) of non-infectious origin without acute organ dysfunction: Secondary | ICD-10-CM | POA: Diagnosis present

## 2021-07-08 DIAGNOSIS — I10 Essential (primary) hypertension: Secondary | ICD-10-CM | POA: Diagnosis present

## 2021-07-08 DIAGNOSIS — K8511 Biliary acute pancreatitis with uninfected necrosis: Secondary | ICD-10-CM | POA: Diagnosis present

## 2021-07-08 DIAGNOSIS — Z79890 Hormone replacement therapy (postmenopausal): Secondary | ICD-10-CM | POA: Diagnosis not present

## 2021-07-08 DIAGNOSIS — Z853 Personal history of malignant neoplasm of breast: Secondary | ICD-10-CM | POA: Diagnosis not present

## 2021-07-08 DIAGNOSIS — D649 Anemia, unspecified: Secondary | ICD-10-CM | POA: Diagnosis present

## 2021-07-08 DIAGNOSIS — J90 Pleural effusion, not elsewhere classified: Secondary | ICD-10-CM | POA: Diagnosis present

## 2021-07-08 DIAGNOSIS — E039 Hypothyroidism, unspecified: Secondary | ICD-10-CM | POA: Diagnosis present

## 2021-07-08 DIAGNOSIS — K8512 Biliary acute pancreatitis with infected necrosis: Secondary | ICD-10-CM | POA: Diagnosis not present

## 2021-07-08 DIAGNOSIS — Z6823 Body mass index (BMI) 23.0-23.9, adult: Secondary | ICD-10-CM | POA: Diagnosis not present

## 2021-07-08 DIAGNOSIS — E440 Moderate protein-calorie malnutrition: Secondary | ICD-10-CM | POA: Diagnosis present

## 2021-07-08 DIAGNOSIS — E8770 Fluid overload, unspecified: Secondary | ICD-10-CM | POA: Diagnosis present

## 2021-07-08 DIAGNOSIS — D72829 Elevated white blood cell count, unspecified: Secondary | ICD-10-CM | POA: Diagnosis not present

## 2021-07-08 DIAGNOSIS — R7989 Other specified abnormal findings of blood chemistry: Secondary | ICD-10-CM

## 2021-07-08 DIAGNOSIS — K859 Acute pancreatitis without necrosis or infection, unspecified: Secondary | ICD-10-CM | POA: Diagnosis present

## 2021-07-08 DIAGNOSIS — Z20822 Contact with and (suspected) exposure to covid-19: Secondary | ICD-10-CM | POA: Diagnosis present

## 2021-07-08 DIAGNOSIS — G4733 Obstructive sleep apnea (adult) (pediatric): Secondary | ICD-10-CM | POA: Diagnosis present

## 2021-07-08 DIAGNOSIS — Z780 Asymptomatic menopausal state: Secondary | ICD-10-CM | POA: Diagnosis not present

## 2021-07-08 DIAGNOSIS — K851 Biliary acute pancreatitis without necrosis or infection: Secondary | ICD-10-CM | POA: Diagnosis not present

## 2021-07-08 DIAGNOSIS — E871 Hypo-osmolality and hyponatremia: Secondary | ICD-10-CM | POA: Diagnosis not present

## 2021-07-08 DIAGNOSIS — Z823 Family history of stroke: Secondary | ICD-10-CM | POA: Diagnosis not present

## 2021-07-08 DIAGNOSIS — F32A Depression, unspecified: Secondary | ICD-10-CM | POA: Diagnosis present

## 2021-07-08 LAB — HIV ANTIBODY (ROUTINE TESTING W REFLEX): HIV Screen 4th Generation wRfx: NONREACTIVE

## 2021-07-08 LAB — URINALYSIS, ROUTINE W REFLEX MICROSCOPIC
Bacteria, UA: NONE SEEN
Bilirubin Urine: NEGATIVE
Glucose, UA: NEGATIVE mg/dL
Ketones, ur: >=80 mg/dL — AB
Leukocytes,Ua: NEGATIVE
Nitrite: NEGATIVE
Specific Gravity, Urine: 1.025 (ref 1.005–1.030)
pH: 7.5 (ref 5.0–8.0)

## 2021-07-08 LAB — LIPID PANEL
Cholesterol: 159 mg/dL (ref 0–200)
HDL: 52 mg/dL (ref >40)
LDL Cholesterol: 99 mg/dL (ref 0–99)
Total CHOL/HDL Ratio: 3.1 ratio
Triglycerides: 39 mg/dL (ref <150)
VLDL: 8 mg/dL (ref 0–40)

## 2021-07-08 LAB — HEPATITIS PANEL, ACUTE
HCV Ab: NONREACTIVE
Hep A IgM: NONREACTIVE
Hep B C IgM: NONREACTIVE
Hepatitis B Surface Ag: NONREACTIVE

## 2021-07-08 LAB — COMPREHENSIVE METABOLIC PANEL
ALT: 95 U/L — ABNORMAL HIGH (ref 0–44)
AST: 44 U/L — ABNORMAL HIGH (ref 15–41)
Albumin: 3.3 g/dL — ABNORMAL LOW (ref 3.5–5.0)
Alkaline Phosphatase: 90 U/L (ref 38–126)
Anion gap: 8 (ref 5–15)
BUN: 7 mg/dL (ref 6–20)
CO2: 27 mmol/L (ref 22–32)
Calcium: 7.5 mg/dL — ABNORMAL LOW (ref 8.9–10.3)
Chloride: 103 mmol/L (ref 98–111)
Creatinine, Ser: 0.55 mg/dL (ref 0.44–1.00)
GFR, Estimated: >60 mL/min (ref >60)
Glucose, Bld: 109 mg/dL — ABNORMAL HIGH (ref 70–99)
Potassium: 3 mmol/L — ABNORMAL LOW (ref 3.5–5.1)
Sodium: 138 mmol/L (ref 135–145)
Total Bilirubin: 0.6 mg/dL (ref 0.3–1.2)
Total Protein: 5.9 g/dL — ABNORMAL LOW (ref 6.5–8.1)

## 2021-07-08 LAB — CBC
HCT: 34.9 % — ABNORMAL LOW (ref 36.0–46.0)
Hemoglobin: 12 g/dL (ref 12.0–15.0)
MCH: 30.4 pg (ref 26.0–34.0)
MCHC: 34.4 g/dL (ref 30.0–36.0)
MCV: 88.4 fL (ref 80.0–100.0)
Platelets: 213 10*3/uL (ref 150–400)
RBC: 3.95 MIL/uL (ref 3.87–5.11)
RDW: 13.2 % (ref 11.5–15.5)
WBC: 11.8 10*3/uL — ABNORMAL HIGH (ref 4.0–10.5)
nRBC: 0 % (ref 0.0–0.2)

## 2021-07-08 LAB — LIPASE, BLOOD: Lipase: 307 U/L — ABNORMAL HIGH (ref 11–51)

## 2021-07-08 MED ORDER — ONDANSETRON HCL 4 MG/2ML IJ SOLN
4.0000 mg | Freq: Four times a day (QID) | INTRAMUSCULAR | Status: DC | PRN
  Administered 2021-07-08 – 2021-07-10 (×6): 4 mg via INTRAVENOUS
  Filled 2021-07-08 (×6): qty 2

## 2021-07-08 MED ORDER — SODIUM CHLORIDE 0.9 % IV SOLN
1000.0000 mL | INTRAVENOUS | Status: DC
  Administered 2021-07-08 – 2021-07-09 (×4): 1000 mL via INTRAVENOUS

## 2021-07-08 MED ORDER — PROCHLORPERAZINE MALEATE 10 MG PO TABS: 10.0000 mg | ORAL_TABLET | Freq: Four times a day (QID) | ORAL | Status: DC | PRN

## 2021-07-08 MED ORDER — POTASSIUM CHLORIDE 10 MEQ/100ML IV SOLN
10.0000 meq | INTRAVENOUS | Status: AC
  Administered 2021-07-08 (×4): 10 meq via INTRAVENOUS
  Filled 2021-07-08 (×4): qty 100

## 2021-07-08 MED ORDER — LEVOTHYROXINE SODIUM 75 MCG PO TABS
75.0000 ug | ORAL_TABLET | Freq: Every day | ORAL | Status: DC
Start: 2021-07-09 — End: 2021-07-20
  Administered 2021-07-10 – 2021-07-20 (×9): 75 ug via ORAL
  Filled 2021-07-08 (×10): qty 1

## 2021-07-08 MED ORDER — HYDROMORPHONE HCL 1 MG/ML IJ SOLN
0.5000 mg | INTRAMUSCULAR | Status: DC | PRN
  Administered 2021-07-08 – 2021-07-09 (×6): 0.5 mg via INTRAVENOUS
  Filled 2021-07-08 (×5): qty 0.5

## 2021-07-08 MED ORDER — PROCHLORPERAZINE EDISYLATE 10 MG/2ML IJ SOLN
10.0000 mg | Freq: Four times a day (QID) | INTRAMUSCULAR | Status: DC | PRN
  Administered 2021-07-09 – 2021-07-10 (×2): 10 mg via INTRAVENOUS
  Filled 2021-07-08 (×2): qty 2

## 2021-07-08 MED ORDER — ONDANSETRON HCL 4 MG PO TABS
4.0000 mg | ORAL_TABLET | Freq: Four times a day (QID) | ORAL | Status: DC | PRN
  Administered 2021-07-12: 16:00:00 4 mg via ORAL
  Filled 2021-07-08: qty 1

## 2021-07-08 NOTE — Plan of Care (Signed)

## 2021-07-08 NOTE — Consult Note (Signed)
[facility] Surgery Consult Note  Brittney Vance 11/17/1965  [id number].    Requesting MD: Raiford Noble, MD Chief Complaint/Reason for Consult: gallstone pancreatitis   HPI:  Brittney Vance is a 55yo female PMH HTN, hypothyroidism, OSA, depression, and hx breast cancer s/p bilateral mastectomy (2015) and neoadjuvant chemo, who was admitted to [facility] yesterday with gallstone pancreatitis. She reports acute onset abdominal pain 2 days ago. Pain was central in her upper and lower abdomen, constant and severe. Worse with PO intake. Associated symptoms include fever, nausea, and vomiting. Tried today alka seltzer with no relief. Denies fever, chills, diarrhea, urinary symptoms. States that she has had similar symptoms for at least a month that usually resolve with tums or alka seltzer. When he symptoms would not resolve she decided to come to the ED.  In the ED she was found to be mildly tachycardic and hypertensive, TMAX 100.6. Lab work significant for normal WBC 9.2; mildly elevated LFTs AST 96, ALT 186, Alk phos 136, and normal Tbili 0.6; lipase 2627. CT a/p showed cholelithiasis and acute edematous pancreatitis. U/s shows cholelithiasis without cholecystitis, common bile duct measure 59m. Patient was admitted to the medical service. General surgery asked to see for consideration of cholecystectomy.  Today her lipase has trended down to 307. Transaminases also down trending AST 44, ALT 95. Her epigastric pain and nausea persist.   Abdominal surgical history: tubal ligation Anticoagulants: none Nonsmoker Denies alcohol or illicit drug use Employment: working from home.  ROS  All systems reviewed and otherwise negative except for as above  Family History  Problem Relation Age of Onset   Hypertension Mother    Diabetes Mother    Alzheimer's disease Mother    Stroke Mother    Hypertension Father    Diabetes Father    Hypertension Brother    Epilepsy Son 4       being worked up for autism    Sleep apnea Son    Thyroid disease Neg Hx     Past Medical History:  Diagnosis Date   [place]   [place], [place] negative    Depression 09/23/2014   Fibroids    Hypertension    Hypothyroid    Sleep apnea     Past Surgical History:  Procedure Laterality Date   AXILLARY LYMPH NODE BIOPSY Left 10/30/2013   Procedure: AXILLARY LYMPH NODE BIOPSY;  Surgeon: TOdis Hollingshead MD;  [place]  Service: General;  Laterality: Left;   BREAST BIOPSY Left 04/24/2013   BREAST RECONSTRUCTION WITH PLACEMENT OF TISSUE EXPANDER AND FLEX HD (ACELLULAR HYDRATED DERMIS) Bilateral 10/30/2013   Procedure: BILATERAL BREAST RECONSTRUCTION WITH PLACEMENT OF TISSUE EXPANDER AND FLEX HD (ACELLULAR HYDRATED DERMIS);  Surgeon: DCrissie Reese MD;  [place]  Service: Plastics;  Laterality: Bilateral;   MASTECTOMY     PORT-A-CATH REMOVAL Right 10/30/2013   Procedure: REMOVAL PORT-A-CATH;  Surgeon: TOdis Hollingshead MD;  [place]  Service: General;  Laterality: Right;   PORTACATH PLACEMENT Right 04/27/2013   Procedure: ULTRASOUND GUIDED PORT INSERTION WITH FLUOROSCOPY;  Surgeon: TOdis Hollingshead MD;  Location[facility];  Service: General;  Laterality: Right;   TOTAL MASTECTOMY Bilateral 10/30/2013   Procedure: TOTAL MASTECTOMY;  Surgeon: TOdis Hollingshead MD;  [place]  Service: General;  Laterality: Bilateral;   TUBAL LIGATION      Social History:  reports that she has never smoked. She has never used smokeless tobacco. She reports that she does  not currently use alcohol. She reports that she does not use drugs.  Allergies: No Known Allergies  Medications Prior to Admission  Medication Sig Dispense Refill   acetaminophen (TYLENOL) 650 MG CR tablet Take 650 mg by mouth every 6 (six) hours as needed for pain.     levothyroxine (SYNTHROID) 75 MCG tablet TAKE 1 TABLET BY MOUTH EVERY DAY (Patient taking differently: Take 75 mcg by mouth daily before breakfast.) 90 tablet 2    methocarbamol (ROBAXIN) 500 MG tablet Take 500 mg by mouth 3 (three) times daily as needed for muscle spasms.     Zoster Vaccine Adjuvanted [facility]) injection Inject 0.5 mLs into the muscle. 0.5 mL 1    Prior to Admission medications   Medication Sig Start Date End Date Taking? Authorizing Provider  acetaminophen (TYLENOL) 650 MG CR tablet Take 650 mg by mouth every 6 (six) hours as needed for pain.   Yes [provider]  levothyroxine (SYNTHROID) 75 MCG tablet TAKE 1 TABLET BY MOUTH EVERY DAY Patient taking differently: Take 75 mcg by mouth daily before breakfast. 11/08/20  Yes [name], MD  methocarbamol (ROBAXIN) 500 MG tablet Take 500 mg by mouth 3 (three) times daily as needed for muscle spasms.   Yes [provider]  Zoster Vaccine Adjuvanted [facility]) injection Inject 0.5 mLs into the muscle. 04/23/21   Carlyle Basques, MD    Blood pressure (!) 153/87, pulse 94, temperature 98.7 F (37.1 C), temperature source Oral, resp. rate 16, height '5\' 3"'  (1.6 m), weight 61.2 kg, last menstrual period 03/10/2013, SpO2 100 %. Physical Exam: General: pleasant, WD/WN female who is laying in bed in NAD HEENT: head is normocephalic, atraumatic.  Sclera are noninjected.  Pupils equal and round.  Ears and nose without any masses or lesions.  Mouth is pink and moist. Dentition fair Heart: regular, rate, and rhythm.  Normal s1,s2. No obvious murmurs, gallops, or rubs noted.  Palpable pedal pulses bilaterally  Lungs: CTAB, no wheezes, rhonchi, or rales noted.  Respiratory effort nonlabored Abd: soft, mild distention, +BS, no masses, hernias, or organomegaly. Globally tender with voluntary guarding, pain most severe over epigastric region. Negative murphy's MS: no BUE/BLE edema, calves soft and nontender Skin: warm and dry with no masses, lesions, or rashes Psych: A&Ox4 with an appropriate affect Neuro: cranial nerves grossly intact, equal strength in BUE/BLE bilaterally, normal  speech, thought process intact  Results for orders placed or performed during the hospital encounter of 07/07/21 (from the past 48 hour(s))  Lipase, blood     Status: Abnormal   Collection Time: 07/07/21  2:36 AM  Result Value Ref Range   Lipase 2,627 (H) 11 - 51 U/L    Comment: RESULT CONFIRMED BY AUTOMATED DILUTION Performed at [facility], [address] [place], [place] [address]   Comprehensive metabolic panel     Status: Abnormal   Collection Time: 07/07/21  2:36 AM  Result Value Ref Range   Sodium 140 135 - 145 mmol/L   Potassium 3.5 3.5 - 5.1 mmol/L   Chloride 102 98 - 111 mmol/L   CO2 27 22 - 32 mmol/L   Glucose, Bld 193 (H) 70 - 99 mg/dL    Comment: Glucose reference range applies only to samples taken after fasting for at least 8 hours.   BUN 15 6 - 20 mg/dL   Creatinine, Ser 0.77 0.44 - 1.00 mg/dL   Calcium 9.5 8.9 - 10.3 mg/dL   Total Protein 7.7 6.5 - 8.1 g/dL  Albumin 4.5 3.5 - 5.0 g/dL   AST 96 (H) 15 - 41 U/L   ALT 186 (H) 0 - 44 U/L   Alkaline Phosphatase 136 (H) 38 - 126 U/L   Total Bilirubin 0.6 0.3 - 1.2 mg/dL   GFR, Estimated >60 >60 mL/min    Comment: (NOTE) Calculated using the CKD-EPI Creatinine Equation (2021)    Anion gap 11 5 - 15    Comment: Performed at [facility], [address], [place], [place] [address]  CBC     Status: None   Collection Time: 07/07/21  2:36 AM  Result Value Ref Range   WBC 9.2 4.0 - 10.5 K/uL   RBC 4.92 3.87 - 5.11 MIL/uL   Hemoglobin 14.7 12.0 - 15.0 g/dL   HCT 43.5 36.0 - 46.0 %   MCV 88.4 80.0 - 100.0 fL   MCH 29.9 26.0 - 34.0 pg   MCHC 33.8 30.0 - 36.0 g/dL   RDW 13.1 11.5 - 15.5 %   Platelets 292 150 - 400 K/uL   nRBC 0.0 0.0 - 0.2 %    Comment: Performed at [facility], [address] [place], [place] [address]  Resp Panel by RT-PCR (Flu A&B, Covid) Nasopharyngeal Swab     Status: None   Collection Time: 07/07/21  5:16 AM   Specimen:  Nasopharyngeal Swab; Nasopharyngeal(NP) swabs in vial transport medium  Result Value Ref Range   SARS Coronavirus 2 by RT PCR NEGATIVE NEGATIVE    Comment: (NOTE) SARS-CoV-2 target nucleic acids are NOT DETECTED.  The SARS-CoV-2 RNA is generally detectable in upper respiratory specimens during the acute phase of infection. The lowest concentration of SARS-CoV-2 viral copies this assay can detect is 138 copies/mL. A negative result does not preclude SARS-Cov-2 infection and should not be used as the sole basis for treatment or other patient management decisions. A negative result may occur with  improper specimen collection/handling, submission of specimen other than nasopharyngeal swab, presence of viral mutation(s) within the areas targeted by this assay, and inadequate number of viral copies(<138 copies/mL). A negative result must be combined with clinical observations, patient history, and epidemiological information. The expected result is Negative.  Fact Sheet for Patients:  EntrepreneurPulse.com.au  Fact Sheet for Healthcare Providers:  IncredibleEmployment.be  This test is no t yet approved or cleared by the [place] FDA and  has been authorized for detection and/or diagnosis of SARS-CoV-2 by FDA under an Emergency Use Authorization (EUA). This EUA will remain  in effect (meaning this test can be used) for the duration of the COVID-19 declaration under Section 564(b)(1) of the Act, 21 U.S.C.section 360bbb-3(b)(1), unless the authorization is terminated  or revoked sooner.       Influenza A by PCR NEGATIVE NEGATIVE   Influenza B by PCR NEGATIVE NEGATIVE    Comment: (NOTE) The Xpert Xpress SARS-CoV-2/FLU/RSV plus assay is intended as an aid in the diagnosis of influenza from Nasopharyngeal swab specimens and should not be used as a sole basis for treatment. Nasal washings and aspirates are unacceptable for Xpert Xpress  SARS-CoV-2/FLU/RSV testing.  Fact Sheet for Patients: EntrepreneurPulse.com.au  Fact Sheet for Healthcare Providers: IncredibleEmployment.be  This test is not yet approved or cleared by the [place] FDA and has been authorized for detection and/or diagnosis of SARS-CoV-2 by FDA under an Emergency Use Authorization (EUA). This EUA will remain in effect (meaning this test can be used) for the duration of the COVID-19 declaration under Section 564(b)(1) of the  Act, 21 U.S.C. section 360bbb-3(b)(1), unless the authorization is terminated or revoked.  Performed at [facility], [address], [place], [place] [address]   Urinalysis, Routine w reflex microscopic     Status: Abnormal   Collection Time: 07/07/21  6:12 AM  Result Value Ref Range   Color, Urine COLORLESS (A) YELLOW   APPearance CLEAR CLEAR   Specific Gravity, Urine >1.046 (H) 1.005 - 1.030   pH 7.0 5.0 - 8.0   Glucose, UA 100 (A) NEGATIVE mg/dL   Hgb urine dipstick NEGATIVE NEGATIVE   Bilirubin Urine NEGATIVE NEGATIVE   Ketones, ur NEGATIVE NEGATIVE mg/dL   Protein, ur NEGATIVE NEGATIVE mg/dL   Nitrite NEGATIVE NEGATIVE   Leukocytes,Ua NEGATIVE NEGATIVE   RBC / HPF 0-5 0 - 5 RBC/hpf   WBC, UA 0-5 0 - 5 WBC/hpf   Squamous Epithelial / LPF 0-5 0 - 5   Mucus PRESENT     Comment: Performed at [facility], [place], [place] [address]  HIV Antibody (routine testing w rflx)     Status: None   Collection Time: 07/07/21  5:23 PM  Result Value Ref Range   HIV Screen 4th Generation wRfx Non Reactive Non Reactive    Comment: Performed at [facility], [address], [place], [place] [address]  Hepatitis panel, acute     Status: None   Collection Time: 07/07/21  5:23 PM  Result Value Ref Range   Hepatitis B Surface Ag NON REACTIVE NON REACTIVE   HCV Ab NON REACTIVE NON REACTIVE    Comment: (NOTE) Nonreactive HCV antibody  screen is consistent with no HCV infections,  unless recent infection is suspected or other evidence exists to indicate HCV infection.     Hep A IgM NON REACTIVE NON REACTIVE   Hep B C IgM NON REACTIVE NON REACTIVE    Comment: Performed at [facility], [place] [address], [place], [place] [address]  Comprehensive metabolic panel     Status: Abnormal   Collection Time: 07/08/21  5:21 AM  Result Value Ref Range   Sodium 138 135 - 145 mmol/L   Potassium 3.0 (L) 3.5 - 5.1 mmol/L   Chloride 103 98 - 111 mmol/L   CO2 27 22 - 32 mmol/L   Glucose, Bld 109 (H) 70 - 99 mg/dL    Comment: Glucose reference range applies only to samples taken after fasting for at least 8 hours.   BUN 7 6 - 20 mg/dL   Creatinine, Ser 0.55 0.44 - 1.00 mg/dL   Calcium 7.5 (L) 8.9 - 10.3 mg/dL   Total Protein 5.9 (L) 6.5 - 8.1 g/dL   Albumin 3.3 (L) 3.5 - 5.0 g/dL   AST 44 (H) 15 - 41 U/L   ALT 95 (H) 0 - 44 U/L   Alkaline Phosphatase 90 38 - 126 U/L   Total Bilirubin 0.6 0.3 - 1.2 mg/dL   GFR, Estimated >60 >60 mL/min    Comment: (NOTE) Calculated using the CKD-EPI Creatinine Equation (2021)    Anion gap 8 5 - 15    Comment: Performed at [facility], [place] [address], [place], [place] [address]  CBC     Status: Abnormal   Collection Time: 07/08/21  5:21 AM  Result Value Ref Range   WBC 11.8 (H) 4.0 - 10.5 K/uL   RBC 3.95 3.87 - 5.11 MIL/uL   Hemoglobin 12.0 12.0 - 15.0 g/dL   HCT 34.9 (L)  36.0 - 46.0 %   MCV 88.4 80.0 - 100.0 fL   MCH 30.4 26.0 - 34.0 pg   MCHC 34.4 30.0 - 36.0 g/dL   RDW 13.2 11.5 - 15.5 %   Platelets 213 150 - 400 K/uL   nRBC 0.0 0.0 - 0.2 %    Comment: Performed at [facility], [place] [address], [place], [place] [address]  Lipase, blood     Status: Abnormal   Collection Time: 07/08/21  5:21 AM  Result Value Ref Range   Lipase 307 (H) 11 - 51 U/L    Comment: Performed at [facility], [place] [address], [place], [place]  [address]   CT ABDOMEN PELVIS W CONTRAST  Result Date: 07/07/2021 CLINICAL DATA:  Acute pancreatitis. EXAM: CT ABDOMEN AND PELVIS WITH CONTRAST TECHNIQUE: Multidetector CT imaging of the abdomen and pelvis was performed using the standard protocol following bolus administration of intravenous contrast. CONTRAST:  157m OMNIPAQUE IOHEXOL 300 MG/ML  SOLN COMPARISON:  09/27/2014 FINDINGS: Lower chest:  Small sliding hiatal hernia. Hepatobiliary: No focal liver abnormality.Gas containing gallstones which are multiple. No evidence of acute cholecystitis. No bile duct dilatation. Pancreas: Pancreas expansion and peripancreatic edema. No necrosis or organized collection. Spleen: Unremarkable. Adrenals/Urinary Tract: Negative adrenals. No hydronephrosis or stone. Unremarkable bladder. Stomach/Bowel: No obstruction. No appendicitis. Left colonic diverticulosis. Vascular/Lymphatic: No acute vascular abnormality. No mass or adenopathy. Reproductive:Partially calcified mass in the right pelvis measuring up to 3.6 cm. There is an apparent stalk to the fundus of the uterus and calcified pedunculated fibroid is again favored. Other: Small volume ascites again considered reactive. Musculoskeletal: No acute abnormalities. Scoliosis and spinal degeneration. IMPRESSION: 1. Acute edematous pancreatitis without collection. 2. Cholelithiasis. Electronically Signed   By: JJorje GuildM.D.   On: 07/07/2021 04:57   [place]domen Limited RUQ (LIVER/GB)  Result Date: 07/07/2021 CLINICAL DATA:  Elevated liver function tests EXAM: ULTRASOUND ABDOMEN LIMITED RIGHT UPPER QUADRANT COMPARISON:  07/07/2021 FINDINGS: Gallbladder: Are multiple shadowing gallstones, largest measuring 13 mm. No gallbladder wall thickening or pericholecystic fluid. Common bile duct: Diameter: 6 mm Liver: No focal lesion identified. Within normal limits in parenchymal echogenicity. Portal vein is patent on color Doppler imaging with normal direction of blood flow  towards the liver. Other: Trace free fluid right upper quadrant. IMPRESSION: 1. Cholelithiasis without cholecystitis. 2. Trace free fluid right upper quadrant, consistent with reactive changes given pancreatitis on recent CT. Electronically Signed   By: MRanda NgoM.D.   On: 07/07/2021 18:12    Anti-infectives (From admission, onward)    None        Assessment/Plan Gallstone pancreatitis  - Patient with concern for gallstone pancreatitis. We recommend laparoscopic cholecystectomy this admission once her pancreatitis has resolved. Her lipase is trending down and pain and nausea are improving but still present. Will repeat labs tomorrow and discuss timing of surgery with MD.  ID - none VTE - SCDs, lovenox FEN - IVF, CLD Foley - none  HTN Hypothyroidism OSA Depression Hx breast cancer s/p bilateral mastectomies and neoadjuvant chemo  Brittney Vance [facility] 07/08/2021, 2:29 PM Please see Amion for pager number during day hours 7:00am-4:30pm

## 2021-07-08 NOTE — Progress Notes (Addendum)
PROGRESS NOTE    Brittney Vance  QIO:[id number] DOB: 1966/05/07 DOA: 07/07/2021 PCP: [name], MD  Brief Narrative:  Patient is a 55 year old African-American female with a past medical history significant for but not limited to breast cancer, obstructive sleep apnea, hypothyroidism as well as other comorbidities who presented with nausea vomiting and abdominal pain.  She had acute onset of her symptoms yesterday and her stomach pain was across the umbilicus and left upper quadrant associate with nausea vomiting.  She tried Alka-Seltzer for this but did not do anything for her and eating made it worse.  She is unable to keep anything down and she did not have any fever or diarrhea and because her symptoms did not resolve she went to the ED for further evaluation and CT scan was done and showed acute pancreatitis with no cystic necrosis seen.  He also noted to have cholelithiasis but no acute cholecystitis and no bile ductal dilatation.  She was placed on fluids, pain control antiemetics and admitted for acute pancreatitis.  General surgery was consulted for further evaluation given her cholelithiasis  Assessment & Plan:   Principal Problem:   Acute pancreatitis  SIRS from Acute Pancreatitis -WBC worsened and went from 9.2 -> 11.8 and she was febrile (100.6) and tachycardic (105) as well as a little bit tachypneic (30) -Lipase Level was 2627 -> 307 -Was initially n.p.o. but will advance to CLD and advance Diet as Tolerated -Pain Control with IV Hydromorphone 0.5 mg q3hprn Severe Pain -Will stop Promethazine 12.5 mg q6hprn and Start Ondansetron 4 mg po/IV and 10 mg Compazine if Zofran ineffective -Continue with supportive care and continue to monitor -She is given 2 L of normal saline boluses and is started on normal saline at 200 MLS per hour but will reduce to 125 MLS per hour for now -She is unclear etiology of her pancreatitis but likely could have been from choledocholithiasis and  passed stone.  She denies any alcohol use and lipid panel is pending -General surgery has been consulted as below and will defer to them for MRCP  Abnormal LFTs -In the setting of Pancreatitis  -Patient's AST went from 96 -> 44 -ALT went from 186 -> 95 -Acute Hepatitis Panel Negative -CT Abdomen and Pelvis done and showed "Acute edematous pancreatitis without collection. Cholelithiasis." -Continue to monitor and trend LFTs  Cholelithaiasis  -Seen on CT Scan  -U/S of the Abd/Pelvis done and showed "Cholelithiasis without cholecystitis. Trace free fluid right upper quadrant, consistent with reactive changes given pancreatitis on recent CT." -May need MRCP if not improving but she had no bile duct dilatation on her CT scan and her common bile duct was 6 mm on ultrasound; she did have multiple shadowing gallstones with the largest measuring 13 mm with no gallbladder wall thickening or pericholecystic fluid -Will have referral to General Surgery and have them evaluate  Hypothyroidism -She takes 75 mcg of levothyroxine daily so we will resume -Checked TSH and was 1.40 and Free T4 was 0.81  Hypokalemia -Patient's K+ was 3.0 -Replete with IV Kcl 40 mEQ x1 -Check Mag Level in the AM -Continue to Monitor and Replete as Necessary -Repeat CMP in the AM   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 -Outpatient follow up with Oncology  Hyperglycemia -Likely reactive but will evaluate for diabetes mellitus type 2 -Initial blood sugar was 193 and then repeat this morning was 109 -Continue monitor CBGs carefully and if necessary will place on sensitive NovoLog sliding scale insulin  Hematuria -Initially U/A Negative -Repeat U/A now given nursing stating that patient having Hematuria and Cloudy Urine  DVT prophylaxis: Enoxaparin 40 mg sq q24h Code Status: FULL CODE  Family Communication: No family currently at bedside Disposition Plan: Pending further clinical improvement  Status is:  Inpatient  The patient will require care spanning > 2 midnights and should be moved to inpatient because: She will need to tolerate diet prior to safe discharge disposition  Consultants:  General Surgery  Procedures:  RUQ U/S   Antimicrobials:  Anti-infectives (From admission, onward)    None        Subjective: Seen and examined at bedside and she is sitting in the chair still complain of some nausea and abdominal pain.  She is somnolent and drowsy this morning.  No lightheadedness or dizziness.  States that her pain was not really well controlled and that she did not feel much better.  We will advance diet to clear liquid diet to see if she can tolerate anything.  No other concerns or complaints at this time.  Objective: Vitals:   07/07/21 1502 07/07/21 1958 07/07/21 2301 07/08/21 0339  BP: (!) 170/93 (!) 150/85 (!) 159/96 (!) 145/86  Pulse: 100 100 100 99  Resp: (!) 24 20  20   Temp: 98.4 F (36.9 C) (!) 100.6 F (38.1 C) (!) 100.6 F (38.1 C) 99.1 F (37.3 C)  TempSrc:  Oral Oral Oral  SpO2: 97% 98% 99% 93%  Weight:      Height:        Intake/Output Summary (Last 24 hours) at 07/08/2021 0859 Last data filed at 07/08/2021 0445 Gross per 24 hour  Intake 2375.4 ml  Output --  Net 2375.4 ml   Filed Weights   07/07/21 0234  Weight: 61.2 kg   Examination: Physical Exam:  Constitutional: WN/WD African-American female currently in NAD and appears calm and a little bit drowsy and somnolent Eyes: Lids and conjunctivae normal, sclerae anicteric  ENMT: External Ears, Nose appear normal. Grossly normal hearing. Mucous membranes are moist.  Neck: Appears normal, supple, no cervical masses, normal ROM, no appreciable thyromegaly; no appreciable JVD Respiratory: Diminished to auscultation bilaterally, no wheezing, rales, rhonchi or crackles. Normal respiratory effort and patient is not tachypenic. No accessory muscle use.  Unlabored breathing Cardiovascular: RRR, no murmurs  / rubs / gallops. S1 and S2 auscultated.  No appreciable extremity edema Abdomen: Soft, tender to palpate, not really distended. Bowel sounds positive.  GU: Deferred. Musculoskeletal: No clubbing / cyanosis of digits/nails. No joint deformity upper and lower extremities.  Skin: No rashes, lesions, ulcers on limited skin evaluation. No induration; Warm and dry.  Neurologic: CN 2-12 grossly intact with no focal deficits but she is extremely somnolent and drowsy.  Romberg sign cerebellar reflexes not assessed.  Psychiatric: Normal judgment and insight.  Patient is somnolent and drowsy. Normal mood and appropriate affect.   Data Reviewed: I have personally reviewed following labs and imaging studies  CBC: Recent Labs  Lab 07/07/21 0236 07/08/21 0521  WBC 9.2 11.8*  HGB 14.7 12.0  HCT 43.5 34.9*  MCV 88.4 88.4  PLT 292 147   Basic Metabolic Panel: Recent Labs  Lab 07/07/21 0236 07/08/21 0521  NA 140 138  K 3.5 3.0*  CL 102 103  CO2 27 27  GLUCOSE 193* 109*  BUN 15 7  CREATININE 0.77 0.55  CALCIUM 9.5 7.5*   GFR: Estimated Creatinine Clearance: 65 mL/min (by C-G formula based on SCr of 0.55 mg/dL). Liver  Function Tests: Recent Labs  Lab 07/07/21 0236 07/08/21 0521  AST 96* 44*  ALT 186* 95*  ALKPHOS 136* 90  BILITOT 0.6 0.6  PROT 7.7 5.9*  ALBUMIN 4.5 3.3*   Recent Labs  Lab 07/07/21 0236  LIPASE 2,627*   No results for input(s): AMMONIA in the last 168 hours. Coagulation Profile: No results for input(s): INR, PROTIME in the last 168 hours. Cardiac Enzymes: No results for input(s): CKTOTAL, CKMB, CKMBINDEX, TROPONINI in the last 168 hours. BNP (last 3 results) No results for input(s): PROBNP in the last 8760 hours. HbA1C: No results for input(s): HGBA1C in the last 72 hours. CBG: No results for input(s): GLUCAP in the last 168 hours. Lipid Profile: No results for input(s): CHOL, HDL, LDLCALC, TRIG, CHOLHDL, LDLDIRECT in the last 72 hours. Thyroid Function  Tests: No results for input(s): TSH, T4TOTAL, FREET4, T3FREE, THYROIDAB in the last 72 hours. Anemia Panel: No results for input(s): VITAMINB12, FOLATE, FERRITIN, TIBC, IRON, RETICCTPCT in the last 72 hours. Sepsis Labs: No results for input(s): PROCALCITON, LATICACIDVEN in the last 168 hours.  Recent Results (from the past 240 hour(s))  Resp Panel by RT-PCR (Flu A&B, Covid) Nasopharyngeal Swab     Status: None   Collection Time: 07/07/21  5:16 AM   Specimen: Nasopharyngeal Swab; Nasopharyngeal(NP) swabs in vial transport medium  Result Value Ref Range Status   SARS Coronavirus 2 by RT PCR NEGATIVE NEGATIVE Final    Comment: (NOTE) SARS-CoV-2 target nucleic acids are NOT DETECTED.  The SARS-CoV-2 RNA is generally detectable in upper respiratory specimens during the acute phase of infection. The lowest concentration of SARS-CoV-2 viral copies this assay can detect is 138 copies/mL. A negative result does not preclude SARS-Cov-2 infection and should not be used as the sole basis for treatment or other patient management decisions. A negative result may occur with  improper specimen collection/handling, submission of specimen other than nasopharyngeal swab, presence of viral mutation(s) within the areas targeted by this assay, and inadequate number of viral copies(<138 copies/mL). A negative result must be combined with clinical observations, patient history, and epidemiological information. The expected result is Negative.  Fact Sheet for Patients:  EntrepreneurPulse.com.au  Fact Sheet for Healthcare Providers:  IncredibleEmployment.be  This test is no t yet approved or cleared by the [place] FDA and  has been authorized for detection and/or diagnosis of SARS-CoV-2 by FDA under an Emergency Use Authorization (EUA). This EUA will remain  in effect (meaning this test can be used) for the duration of the COVID-19 declaration under Section  564(b)(1) of the Act, 21 U.S.C.section 360bbb-3(b)(1), unless the authorization is terminated  or revoked sooner.       Influenza A by PCR NEGATIVE NEGATIVE Final   Influenza B by PCR NEGATIVE NEGATIVE Final    Comment: (NOTE) The Xpert Xpress SARS-CoV-2/FLU/RSV plus assay is intended as an aid in the diagnosis of influenza from Nasopharyngeal swab specimens and should not be used as a sole basis for treatment. Nasal washings and aspirates are unacceptable for Xpert Xpress SARS-CoV-2/FLU/RSV testing.  Fact Sheet for Patients: EntrepreneurPulse.com.au  Fact Sheet for Healthcare Providers: IncredibleEmployment.be  This test is not yet approved or cleared by the [place] FDA and has been authorized for detection and/or diagnosis of SARS-CoV-2 by FDA under an Emergency Use Authorization (EUA). This EUA will remain in effect (meaning this test can be used) for the duration of the COVID-19 declaration under Section 564(b)(1) of the Act, 21 U.S.C. section 360bbb-3(b)(1), unless the  authorization is terminated or revoked.  Performed at [facility], [address] [place], [place] [address]     RN Pressure Injury Documentation:     Estimated body mass index is 23.91 kg/m as calculated from the following:   Height as of this encounter: 5\' 3"  (1.6 m).   Weight as of this encounter: 61.2 kg.  Malnutrition Type:   Malnutrition Characteristics:   Nutrition Interventions:    Radiology Studies: CT ABDOMEN PELVIS W CONTRAST  Result Date: 07/07/2021 CLINICAL DATA:  Acute pancreatitis. EXAM: CT ABDOMEN AND PELVIS WITH CONTRAST TECHNIQUE: Multidetector CT imaging of the abdomen and pelvis was performed using the standard protocol following bolus administration of intravenous contrast. CONTRAST:  146mL OMNIPAQUE IOHEXOL 300 MG/ML  SOLN COMPARISON:  09/27/2014 FINDINGS: Lower chest:  Small sliding hiatal hernia.  Hepatobiliary: No focal liver abnormality.Gas containing gallstones which are multiple. No evidence of acute cholecystitis. No bile duct dilatation. Pancreas: Pancreas expansion and peripancreatic edema. No necrosis or organized collection. Spleen: Unremarkable. Adrenals/Urinary Tract: Negative adrenals. No hydronephrosis or stone. Unremarkable bladder. Stomach/Bowel: No obstruction. No appendicitis. Left colonic diverticulosis. Vascular/Lymphatic: No acute vascular abnormality. No mass or adenopathy. Reproductive:Partially calcified mass in the right pelvis measuring up to 3.6 cm. There is an apparent stalk to the fundus of the uterus and calcified pedunculated fibroid is again favored. Other: Small volume ascites again considered reactive. Musculoskeletal: No acute abnormalities. Scoliosis and spinal degeneration. IMPRESSION: 1. Acute edematous pancreatitis without collection. 2. Cholelithiasis. Electronically Signed   By: Jorje Guild M.D.   On: 07/07/2021 04:57   US Abdomen Limited RUQ (LIVER/GB)  Result Date: 07/07/2021 CLINICAL DATA:  Elevated liver function tests EXAM: ULTRASOUND ABDOMEN LIMITED RIGHT UPPER QUADRANT COMPARISON:  07/07/2021 FINDINGS: Gallbladder: Are multiple shadowing gallstones, largest measuring 13 mm. No gallbladder wall thickening or pericholecystic fluid. Common bile duct: Diameter: 6 mm Liver: No focal lesion identified. Within normal limits in parenchymal echogenicity. Portal vein is patent on color Doppler imaging with normal direction of blood flow towards the liver. Other: Trace free fluid right upper quadrant. IMPRESSION: 1. Cholelithiasis without cholecystitis. 2. Trace free fluid right upper quadrant, consistent with reactive changes given pancreatitis on recent CT. Electronically Signed   By: Randa Ngo M.D.   On: 07/07/2021 18:12    Scheduled Meds:  Chlorhexidine Gluconate Cloth  6 each Topical Daily   enoxaparin (LOVENOX) injection  40 mg Subcutaneous Q24H    influenza vac split quadrivalent PF  0.5 mL Intramuscular Tomorrow-1000   Continuous Infusions:  sodium chloride 1,000 mL (07/08/21 0516)   promethazine (PHENERGAN) injection (IM or IVPB) 12.5 mg (07/08/21 0351)    LOS: 0 days   Kerney Elbe, DO [facility] PAGER is on [place]  If 7PM-7AM, please contact night-coverage [email or web address]

## 2021-07-09 ENCOUNTER — Inpatient Hospital Stay (HOSPITAL_COMMUNITY): Payer: BC Managed Care – PPO | Admitting: Certified Registered"

## 2021-07-09 DIAGNOSIS — K851 Biliary acute pancreatitis without necrosis or infection: Secondary | ICD-10-CM

## 2021-07-09 DIAGNOSIS — E871 Hypo-osmolality and hyponatremia: Secondary | ICD-10-CM

## 2021-07-09 DIAGNOSIS — D649 Anemia, unspecified: Secondary | ICD-10-CM

## 2021-07-09 LAB — COMPREHENSIVE METABOLIC PANEL
ALT: 75 U/L — ABNORMAL HIGH (ref 0–44)
AST: 37 U/L (ref 15–41)
Albumin: 3.2 g/dL — ABNORMAL LOW (ref 3.5–5.0)
Alkaline Phosphatase: 85 U/L (ref 38–126)
Anion gap: 9 (ref 5–15)
BUN: 6 mg/dL (ref 6–20)
CO2: 26 mmol/L (ref 22–32)
Calcium: 7.9 mg/dL — ABNORMAL LOW (ref 8.9–10.3)
Chloride: 98 mmol/L (ref 98–111)
Creatinine, Ser: 0.55 mg/dL (ref 0.44–1.00)
GFR, Estimated: >60 mL/min (ref >60)
Glucose, Bld: 101 mg/dL — ABNORMAL HIGH (ref 70–99)
Potassium: 2.9 mmol/L — ABNORMAL LOW (ref 3.5–5.1)
Sodium: 133 mmol/L — ABNORMAL LOW (ref 135–145)
Total Bilirubin: 1.2 mg/dL (ref 0.3–1.2)
Total Protein: 6 g/dL — ABNORMAL LOW (ref 6.5–8.1)

## 2021-07-09 LAB — CBC
HCT: 33.6 % — ABNORMAL LOW (ref 36.0–46.0)
Hemoglobin: 11.6 g/dL — ABNORMAL LOW (ref 12.0–15.0)
MCH: 30.8 pg (ref 26.0–34.0)
MCHC: 34.5 g/dL (ref 30.0–36.0)
MCV: 89.1 fL (ref 80.0–100.0)
Platelets: 180 10*3/uL (ref 150–400)
RBC: 3.77 MIL/uL — ABNORMAL LOW (ref 3.87–5.11)
RDW: 13.2 % (ref 11.5–15.5)
WBC: 10.7 10*3/uL — ABNORMAL HIGH (ref 4.0–10.5)
nRBC: 0 % (ref 0.0–0.2)

## 2021-07-09 LAB — LIPASE, BLOOD: Lipase: 54 U/L — ABNORMAL HIGH (ref 11–51)

## 2021-07-09 LAB — MAGNESIUM: Magnesium: 2 mg/dL (ref 1.7–2.4)

## 2021-07-09 LAB — PHOSPHORUS: Phosphorus: <1 mg/dL — CL (ref 2.5–4.6)

## 2021-07-09 MED ORDER — LIP MEDEX EX OINT
TOPICAL_OINTMENT | CUTANEOUS | Status: DC | PRN
  Administered 2021-07-09: 75 via TOPICAL
  Filled 2021-07-09: qty 7

## 2021-07-09 MED ORDER — SODIUM PHOSPHATES 45 MMOLE/15ML IV SOLN
30.0000 mmol | Freq: Once | INTRAVENOUS | Status: AC
  Administered 2021-07-09: 30 mmol via INTRAVENOUS
  Filled 2021-07-09: qty 10

## 2021-07-09 MED ORDER — SODIUM CHLORIDE 0.9 % IV SOLN: 2.0000 g | INTRAVENOUS | Status: AC

## 2021-07-09 MED ORDER — POTASSIUM CHLORIDE 10 MEQ/100ML IV SOLN
10.0000 meq | INTRAVENOUS | Status: AC
  Administered 2021-07-09 (×4): 10 meq via INTRAVENOUS
  Filled 2021-07-09 (×4): qty 100

## 2021-07-09 MED ORDER — K PHOS MONO-SOD PHOS DI & MONO 155-852-130 MG PO TABS
500.0000 mg | ORAL_TABLET | Freq: Two times a day (BID) | ORAL | Status: AC
  Administered 2021-07-09: 500 mg via ORAL
  Filled 2021-07-09 (×2): qty 2

## 2021-07-09 MED ORDER — HYDROMORPHONE HCL 1 MG/ML IJ SOLN
0.5000 mg | INTRAMUSCULAR | Status: DC | PRN
  Administered 2021-07-09 – 2021-07-18 (×19): 0.5 mg via INTRAVENOUS
  Filled 2021-07-09 (×19): qty 0.5

## 2021-07-09 MED ORDER — OXYCODONE HCL 5 MG PO TABS
5.0000 mg | ORAL_TABLET | Freq: Four times a day (QID) | ORAL | Status: DC | PRN
  Administered 2021-07-11: 10 mg via ORAL
  Administered 2021-07-12: 06:00:00 5 mg via ORAL
  Administered 2021-07-12: 16:00:00 10 mg via ORAL
  Administered 2021-07-13: 04:00:00 5 mg via ORAL
  Administered 2021-07-13 – 2021-07-20 (×17): 10 mg via ORAL
  Filled 2021-07-09 (×12): qty 2
  Filled 2021-07-09: qty 1
  Filled 2021-07-09 (×2): qty 2
  Filled 2021-07-09: qty 1
  Filled 2021-07-09 (×8): qty 2

## 2021-07-09 NOTE — Progress Notes (Addendum)
Subjective: CC: Patient with continued 7/10 epigastric abdominal pain radiating to her back with waves of nausea. Some spit up yesterday but no emesis.   Objective: Vital signs in last 24 hours: Temp:  [98.7 F (37.1 C)-99.9 F (37.7 C)] 99.6 F (37.6 C) (12/15 0341) Pulse Rate:  [94-104] 100 (12/15 0341) Resp:  [16] 16 (12/14 1341) BP: (153-162)/(87-100) 160/92 (12/15 0341) SpO2:  [93 %-100 %] 93 % (12/15 0341) Last BM Date: 07/05/21 (per pt)  Intake/Output from previous day: 12/14 0701 - 12/15 0700 In: 1163.6 [I.V.:734.9; IV Piggyback:428.7] Out: -  Intake/Output this shift: No intake/output data recorded.  PE: Gen:  Alert, appears mildly uncomfortable, pleasant Abd: Soft, mild upper abdominal distension, diffuse upper abdominal tenderness greatest in the epigastrium, slightly hypoactive bs.  Ext:  No LE edema or calf tenderness Psych: A&Ox3  Skin: no rashes noted, warm and dry  Lab Results:  Recent Labs    07/08/21 0521 07/09/21 0503  WBC 11.8* 10.7*  HGB 12.0 11.6*  HCT 34.9* 33.6*  PLT 213 180   BMET Recent Labs    07/08/21 0521 07/09/21 0503  NA 138 133*  K 3.0* 2.9*  CL 103 98  CO2 27 26  GLUCOSE 109* 101*  BUN 7 6  CREATININE 0.55 0.55  CALCIUM 7.5* 7.9*   PT/INR No results for input(s): LABPROT, INR in the last 72 hours. CMP     Component Value Date/Time   NA 133 (L) 07/09/2021 0503   NA 140 05/19/2017 1035   K 2.9 (L) 07/09/2021 0503   K 3.5 05/19/2017 1035   CL 98 07/09/2021 0503   CO2 26 07/09/2021 0503   CO2 29 05/19/2017 1035   GLUCOSE 101 (H) 07/09/2021 0503   GLUCOSE 101 05/19/2017 1035   BUN 6 07/09/2021 0503   BUN 7.0 05/19/2017 1035   CREATININE 0.55 07/09/2021 0503   CREATININE 0.61 03/24/2020 0932   CREATININE 0.7 05/19/2017 1035   CALCIUM 7.9 (L) 07/09/2021 0503   CALCIUM 9.9 05/19/2017 1035   PROT 6.0 (L) 07/09/2021 0503   PROT 7.7 05/19/2017 1035   ALBUMIN 3.2 (L) 07/09/2021 0503   ALBUMIN 4.0 05/19/2017  1035   AST 37 07/09/2021 0503   AST 22 02/20/2019 1307   AST 21 05/19/2017 1035   ALT 75 (H) 07/09/2021 0503   ALT 21 02/20/2019 1307   ALT 16 05/19/2017 1035   ALKPHOS 85 07/09/2021 0503   ALKPHOS 91 05/19/2017 1035   BILITOT 1.2 07/09/2021 0503   BILITOT 0.3 02/20/2019 1307   BILITOT 0.39 05/19/2017 1035   GFRNONAA >60 07/09/2021 0503   GFRNONAA >60 02/20/2019 1307   GFRNONAA CANCELED 01/01/2015 0912   GFRAA >60 02/20/2019 1307   GFRAA CANCELED 01/01/2015 0912   Lipase     Component Value Date/Time   LIPASE 54 (H) 07/09/2021 0503    Studies/Results: US Abdomen Limited RUQ (LIVER/GB)  Result Date: 07/07/2021 CLINICAL DATA:  Elevated liver function tests EXAM: ULTRASOUND ABDOMEN LIMITED RIGHT UPPER QUADRANT COMPARISON:  07/07/2021 FINDINGS: Gallbladder: Are multiple shadowing gallstones, largest measuring 13 mm. No gallbladder wall thickening or pericholecystic fluid. Common bile duct: Diameter: 6 mm Liver: No focal lesion identified. Within normal limits in parenchymal echogenicity. Portal vein is patent on color Doppler imaging with normal direction of blood flow towards the liver. Other: Trace free fluid right upper quadrant. IMPRESSION: 1. Cholelithiasis without cholecystitis. 2. Trace free fluid right upper quadrant, consistent with reactive changes given pancreatitis on recent  CT. Electronically Signed   By: Randa Ngo M.D.   On: 07/07/2021 18:12    Anti-infectives: Anti-infectives (From admission, onward)    None        Assessment/Plan Gallstone pancreatitis  - Patient with concern for gallstone pancreatitis. We recommend laparoscopic cholecystectomy this admission once her pancreatitis has resolved. LFT's downtrending and T. Bili wnl. Her lipase is trending down but she still has a decent amount of pain/tenderness. Will follow exam closely to determine timing of surgery depending on how the patient progresses. Continue to trend labs.    ID - none VTE - SCDs,  lovenox FEN - IVF, okay for CLD. NPO at midnight. Replace K (2.9) Foley - none   HTN Hypothyroidism OSA Depression Hx breast cancer s/p bilateral mastectomies and neoadjuvant chemo   LOS: 1 day    Jillyn Ledger , [facility] Surgery 07/09/2021, 9:05 AM Please see Amion for pager number during day hours 7:00am-4:30pm

## 2021-07-09 NOTE — Progress Notes (Signed)
PROGRESS NOTE    Brittney Vance  LTJ:[id number] DOB: Jun 26, 1966 DOA: 07/07/2021 PCP: [name], MD  Brief Narrative:  Patient is a 55 year old African-American female with a past medical history significant for but not limited to breast cancer, obstructive sleep apnea, hypothyroidism as well as other comorbidities who presented with nausea vomiting and abdominal pain.  She had acute onset of her symptoms yesterday and her stomach pain was across the umbilicus and left upper quadrant associate with nausea vomiting.  She tried Alka-Seltzer for this but did not do anything for her and eating made it worse.  She is unable to keep anything down and she did not have any fever or diarrhea and because her symptoms did not resolve she went to the ED for further evaluation and CT scan was done and showed acute pancreatitis with no cystic necrosis seen.  He also noted to have cholelithiasis but no acute cholecystitis and no bile ductal dilatation.  She was placed on fluids, pain control antiemetics and admitted for acute pancreatitis.  General surgery was consulted for further evaluation given her cholelithiasis and are planning for laparoscopic gallbladder removal this admission.  Because patient continued to have some pain and nausea they will reevaluate anticipate having surgical intervention tomorrow Friday, 07/10/2021.  Assessment & Plan:   Principal Problem:   Acute pancreatitis  SIRS from Acute Pancreatitis -WBC worsened and went from 9.2 -> 11.8 and she was febrile (100.6) and tachycardic (105) as well as a little bit tachypneic (30) -Lipase Level was 2627 -> 307 today has now improved and lipase level is 61 -Was initially n.p.o. but will advance to CLD and advance Diet as Tolerated; remains on a clear liquid diet and will be n.p.o. at midnight -Pain Control with IV Hydromorphone 0.5 mg q3hprn Severe Pain -Will stop Promethazine 12.5 mg q6hprn and Start Ondansetron 4 mg po/IV and 10 mg  Compazine if Zofran ineffective -Continue with supportive care and continue to monitor -She is given 2 L of normal saline boluses and is started on normal saline at 200 MLS per hour but will reduce to 125 MLS per hour for now and continue today -She is unclear etiology of her pancreatitis but likely could have been from choledocholithiasis and passed stone.  She denies any alcohol use and lipid panel is negative -General surgery has been consulted as below and will defer to them for MRCP  Abnormal LFTs -In the setting of Pancreatitis  -Patient's AST went from 96 -> 44 and is now 37 -ALT went from 186 -> 95 and is now improved to 75 -Acute Hepatitis Panel Negative -CT Abdomen and Pelvis done and showed "Acute edematous pancreatitis without collection. Cholelithiasis." -Continue to monitor and trend LFTs  Cholelithaiasis  -Seen on CT Scan  -U/S of the Abd/Pelvis done and showed "Cholelithiasis without cholecystitis. Trace free fluid right upper quadrant, consistent with reactive changes given pancreatitis on recent CT." -May need MRCP if not improving but she had no bile duct dilatation on her CT scan and her common bile duct was 6 mm on ultrasound; she did have multiple shadowing gallstones with the largest measuring 13 mm with no gallbladder wall thickening or pericholecystic fluid -Will have referral to General Surgery and have them evaluate and they are planning for laparoscopic cholecystectomy this admission once her pancreatitis improves  Hypothyroidism -She takes 75 mcg of levothyroxine daily so we will resume -Checked TSH and was 1.40 and Free T4 was 0.81  Hypokalemia -Patient's K+ was 2.9 -Replete  with IV Kcl 40 mEQ x1 again and with p.o. 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 -Outpatient follow up with Oncology  Hyponatremia -Mild and Na+ has gone  from 140 -> 138 -> 133 -C/w IVF Hydration with NS at 125 mL/hr -Repeat CMP in the AM  Hyperglycemia -Likely reactive but will evaluate for diabetes mellitus type 2 -Continue monitor CBGs carefully and if necessary will place on sensitive NovoLog sliding scale insulin -Blood sugars ranging from 101-193   Normocytic Anemia -Likely Dilutional Drop -Hgb/Hct went from 14.7/43.5 -> 12.0/34.9 -> 11.6/33.6 -Check Anemia Panel in the AM -Continue to Monitor for S/Sx of Bleeding; No overt bleeding note  Hematuria -Initially U/A Negative -Repeat U/A now given nursing stating that patient having Hematuria and Cloudy Urine; repeat urinalysis showed small hemoglobin with greater than 80 ketones, trace protein, no bacteria seen, 0-5 RBCs per high-power field -We will continue IV fluid hydration as above continue to monitor and if persist may need urological evaluation outpatient setting  DVT prophylaxis: Enoxaparin 40 mg sq q24h Code Status: FULL CODE  Family Communication: No family currently at bedside Disposition Plan: Pending further clinical improvement  Status is: Inpatient  The patient will require care spanning > 2 midnights and should be moved to inpatient because: She will need to tolerate diet prior to safe discharge disposition  Consultants:  General Surgery  Procedures:  RUQ U/S   Antimicrobials:  Anti-infectives (From admission, onward)    Start     Dose/Rate Route Frequency Ordered Stop   07/10/21 0600  cefTRIAXone (ROCEPHIN) 2 g in sodium chloride 0.9 % 100 mL IVPB       Note to Pharmacy: Pharmacy may adjust dosing strength / duration / interval for maximal efficacy   2 g 200 mL/hr over 30 Minutes Intravenous On call to O.R. 07/09/21 1053 07/11/21 0559        Subjective: Seen and examined at bedside and still having pain and described as a 7 out of 10.  States that she slept fairly well last night now.  Note nausea or vomiting currently.  Denies any lightheadedness or  dizziness.  No other concerns or complaints at this time and wanting to pursue a cholecystectomy so that this does not happen again..  Objective: Vitals:   07/08/21 0339 07/08/21 1341 07/08/21 2048 07/09/21 0341  BP: (!) 145/86 (!) 153/87 (!) 162/100 (!) 160/92  Pulse: 99 94 (!) 104 100  Resp: 20 16    Temp: 99.1 F (37.3 C) 98.7 F (37.1 C) 99.9 F (37.7 C) 99.6 F (37.6 C)  TempSrc: Oral Oral Oral Oral  SpO2: 93% 100%  93%  Weight:      Height:        Intake/Output Summary (Last 24 hours) at 07/09/2021 1352 Last data filed at 07/09/2021 0300 Gross per 24 hour  Intake 1163.61 ml  Output --  Net 1163.61 ml    Filed Weights   07/07/21 0234  Weight: 61.2 kg   Examination: Physical Exam:  Constitutional: WN/WD AAF  in NAD and appears calm but slightly uncomfortable  Eyes: Lids and conjunctivae normal, sclerae anicteric  ENMT: External Ears, Nose appear normal. Grossly normal hearing.  Neck: Appears normal, supple, no cervical masses, normal ROM, no appreciable thyromegaly; no appreciable JVD Respiratory: Diminished to auscultation bilaterally, no wheezing, rales, rhonchi or crackles. Normal respiratory effort and patient is  not tachypenic. No accessory muscle use.  Unlabored breathing Cardiovascular: RRR, no murmurs / rubs / gallops. S1 and S2 auscultated.  Minimal extremity edema Abdomen: Soft, slightly-tender, nondistended. Bowel sounds positive.  GU: Deferred. Musculoskeletal: No clubbing / cyanosis of digits/nails. No joint deformity upper and lower extremities.  Skin: No rashes, lesions, ulcers on limited skin evaluation. No induration; Warm and dry.  Neurologic: CN 2-12 grossly intact with no focal deficits. Romberg sign and cerebellar reflexes not assessed.  Psychiatric: Normal judgment and insight. Alert and oriented x 3. Normal mood and appropriate affect.   Data Reviewed: I have personally reviewed following labs and imaging studies  CBC: Recent Labs  Lab  07/07/21 0236 07/08/21 0521 07/09/21 0503  WBC 9.2 11.8* 10.7*  HGB 14.7 12.0 11.6*  HCT 43.5 34.9* 33.6*  MCV 88.4 88.4 89.1  PLT 292 213 099    Basic Metabolic Panel: Recent Labs  Lab 07/07/21 0236 07/08/21 0521 07/09/21 0503  NA 140 138 133*  K 3.5 3.0* 2.9*  CL 102 103 98  CO2 27 27 26   GLUCOSE 193* 109* 101*  BUN 15 7 6   CREATININE 0.77 0.55 0.55  CALCIUM 9.5 7.5* 7.9*    GFR: Estimated Creatinine Clearance: 65 mL/min (by C-G formula based on SCr of 0.55 mg/dL). Liver Function Tests: Recent Labs  Lab 07/07/21 0236 07/08/21 0521 07/09/21 0503  AST 96* 44* 37  ALT 186* 95* 75*  ALKPHOS 136* 90 85  BILITOT 0.6 0.6 1.2  PROT 7.7 5.9* 6.0*  ALBUMIN 4.5 3.3* 3.2*    Recent Labs  Lab 07/07/21 0236 07/08/21 0521 07/09/21 0503  LIPASE 2,627* 307* 54*    No results for input(s): AMMONIA in the last 168 hours. Coagulation Profile: No results for input(s): INR, PROTIME in the last 168 hours. Cardiac Enzymes: No results for input(s): CKTOTAL, CKMB, CKMBINDEX, TROPONINI in the last 168 hours. BNP (last 3 results) No results for input(s): PROBNP in the last 8760 hours. HbA1C: No results for input(s): HGBA1C in the last 72 hours. CBG: No results for input(s): GLUCAP in the last 168 hours. Lipid Profile: Recent Labs    07/08/21 0521  CHOL 159  HDL 52  LDLCALC 99  TRIG 39  CHOLHDL 3.1   Thyroid Function Tests: No results for input(s): TSH, T4TOTAL, FREET4, T3FREE, THYROIDAB in the last 72 hours. Anemia Panel: No results for input(s): VITAMINB12, FOLATE, FERRITIN, TIBC, IRON, RETICCTPCT in the last 72 hours. Sepsis Labs: No results for input(s): PROCALCITON, LATICACIDVEN in the last 168 hours.  Recent Results (from the past 240 hour(s))  Resp Panel by RT-PCR (Flu A&B, Covid) Nasopharyngeal Swab     Status: None   Collection Time: 07/07/21  5:16 AM   Specimen: Nasopharyngeal Swab; Nasopharyngeal(NP) swabs in vial transport medium  Result Value Ref  Range Status   SARS Coronavirus 2 by RT PCR NEGATIVE NEGATIVE Final    Comment: (NOTE) SARS-CoV-2 target nucleic acids are NOT DETECTED.  The SARS-CoV-2 RNA is generally detectable in upper respiratory specimens during the acute phase of infection. The lowest concentration of SARS-CoV-2 viral copies this assay can detect is 138 copies/mL. A negative result does not preclude SARS-Cov-2 infection and should not be used as the sole basis for treatment or other patient management decisions. A negative result may occur with  improper specimen collection/handling, submission of specimen other than nasopharyngeal swab, presence of viral mutation(s) within the areas targeted by this assay, and inadequate number of viral copies(<138 copies/mL). A negative result must  be combined with clinical observations, patient history, and epidemiological information. The expected result is Negative.  Fact Sheet for Patients:  EntrepreneurPulse.com.au  Fact Sheet for Healthcare Providers:  IncredibleEmployment.be  This test is no t yet approved or cleared by the [place] FDA and  has been authorized for detection and/or diagnosis of SARS-CoV-2 by FDA under an Emergency Use Authorization (EUA). This EUA will remain  in effect (meaning this test can be used) for the duration of the COVID-19 declaration under Section 564(b)(1) of the Act, 21 U.S.C.section 360bbb-3(b)(1), unless the authorization is terminated  or revoked sooner.       Influenza A by PCR NEGATIVE NEGATIVE Final   Influenza B by PCR NEGATIVE NEGATIVE Final    Comment: (NOTE) The Xpert Xpress SARS-CoV-2/FLU/RSV plus assay is intended as an aid in the diagnosis of influenza from Nasopharyngeal swab specimens and should not be used as a sole basis for treatment. Nasal washings and aspirates are unacceptable for Xpert Xpress SARS-CoV-2/FLU/RSV testing.  Fact Sheet for  Patients: EntrepreneurPulse.com.au  Fact Sheet for Healthcare Providers: IncredibleEmployment.be  This test is not yet approved or cleared by the [place] FDA and has been authorized for detection and/or diagnosis of SARS-CoV-2 by FDA under an Emergency Use Authorization (EUA). This EUA will remain in effect (meaning this test can be used) for the duration of the COVID-19 declaration under Section 564(b)(1) of the Act, 21 U.S.C. section 360bbb-3(b)(1), unless the authorization is terminated or revoked.  Performed at [facility], [address], [place], [place] [address]      RN Pressure Injury Documentation:     Estimated body mass index is 23.91 kg/m as calculated from the following:   Height as of this encounter: 5\' 3"  (1.6 m).   Weight as of this encounter: 61.2 kg.  Malnutrition Type:   Malnutrition Characteristics:   Nutrition Interventions:    Radiology Studies: US Abdomen Limited RUQ (LIVER/GB)  Result Date: 07/07/2021 CLINICAL DATA:  Elevated liver function tests EXAM: ULTRASOUND ABDOMEN LIMITED RIGHT UPPER QUADRANT COMPARISON:  07/07/2021 FINDINGS: Gallbladder: Are multiple shadowing gallstones, largest measuring 13 mm. No gallbladder wall thickening or pericholecystic fluid. Common bile duct: Diameter: 6 mm Liver: No focal lesion identified. Within normal limits in parenchymal echogenicity. Portal vein is patent on color Doppler imaging with normal direction of blood flow towards the liver. Other: Trace free fluid right upper quadrant. IMPRESSION: 1. Cholelithiasis without cholecystitis. 2. Trace free fluid right upper quadrant, consistent with reactive changes given pancreatitis on recent CT. Electronically Signed   By: Randa Ngo M.D.   On: 07/07/2021 18:12    Scheduled Meds:  Chlorhexidine Gluconate Cloth  6 each Topical Daily   enoxaparin (LOVENOX) injection  40 mg Subcutaneous Q24H   influenza  vac split quadrivalent PF  0.5 mL Intramuscular Tomorrow-1000   levothyroxine  75 mcg Oral Q0600   Continuous Infusions:  sodium chloride 1,000 mL (07/09/21 1339)   [START ON 07/10/2021] cefTRIAXone (ROCEPHIN)  IV     potassium chloride 10 mEq (07/09/21 1340)    LOS: 1 day   Kerney Elbe, DO [facility] PAGER is on AMION  If 7PM-7AM, please contact night-coverage [email or web address]

## 2021-07-10 LAB — COMPREHENSIVE METABOLIC PANEL
ALT: 55 U/L — ABNORMAL HIGH (ref 0–44)
AST: 27 U/L (ref 15–41)
Albumin: 3 g/dL — ABNORMAL LOW (ref 3.5–5.0)
Alkaline Phosphatase: 87 U/L (ref 38–126)
Anion gap: 8 (ref 5–15)
BUN: 6 mg/dL (ref 6–20)
CO2: 26 mmol/L (ref 22–32)
Calcium: 7.7 mg/dL — ABNORMAL LOW (ref 8.9–10.3)
Chloride: 99 mmol/L (ref 98–111)
Creatinine, Ser: 0.47 mg/dL (ref 0.44–1.00)
GFR, Estimated: >60 mL/min (ref >60)
Glucose, Bld: 88 mg/dL (ref 70–99)
Potassium: 2.7 mmol/L — CL (ref 3.5–5.1)
Sodium: 133 mmol/L — ABNORMAL LOW (ref 135–145)
Total Bilirubin: 1.1 mg/dL (ref 0.3–1.2)
Total Protein: 5.8 g/dL — ABNORMAL LOW (ref 6.5–8.1)

## 2021-07-10 LAB — CBC
HCT: 31.2 % — ABNORMAL LOW (ref 36.0–46.0)
Hemoglobin: 11.1 g/dL — ABNORMAL LOW (ref 12.0–15.0)
MCH: 30.8 pg (ref 26.0–34.0)
MCHC: 35.6 g/dL (ref 30.0–36.0)
MCV: 86.7 fL (ref 80.0–100.0)
Platelets: 169 10*3/uL (ref 150–400)
RBC: 3.6 MIL/uL — ABNORMAL LOW (ref 3.87–5.11)
RDW: 12.8 % (ref 11.5–15.5)
WBC: 9.1 10*3/uL (ref 4.0–10.5)
nRBC: 0.3 % — ABNORMAL HIGH (ref 0.0–0.2)

## 2021-07-10 LAB — LIPASE, BLOOD: Lipase: 33 U/L (ref 11–51)

## 2021-07-10 LAB — PHOSPHORUS: Phosphorus: 1.3 mg/dL — ABNORMAL LOW (ref 2.5–4.6)

## 2021-07-10 LAB — MAGNESIUM: Magnesium: 1.9 mg/dL (ref 1.7–2.4)

## 2021-07-10 MED ORDER — PIPERACILLIN-TAZOBACTAM 3.375 G IVPB
3.3750 g | Freq: Three times a day (TID) | INTRAVENOUS | Status: DC
Start: 2021-07-10 — End: 2021-07-17
  Administered 2021-07-10 – 2021-07-17 (×21): 3.375 g via INTRAVENOUS
  Filled 2021-07-10 (×23): qty 50

## 2021-07-10 MED ORDER — POTASSIUM PHOSPHATES 15 MMOLE/5ML IV SOLN
30.0000 mmol | Freq: Once | INTRAVENOUS | Status: AC
  Administered 2021-07-10: 30 mmol via INTRAVENOUS
  Filled 2021-07-10: qty 10

## 2021-07-10 MED ORDER — INDOCYANINE GREEN 25 MG IV SOLR
1.0000 mg | INTRAVENOUS | Status: AC
  Filled 2021-07-10: qty 10

## 2021-07-10 MED ORDER — POTASSIUM CHLORIDE 10 MEQ/100ML IV SOLN
10.0000 meq | INTRAVENOUS | Status: AC
  Administered 2021-07-10 (×6): 10 meq via INTRAVENOUS
  Filled 2021-07-10 (×6): qty 100

## 2021-07-10 MED ORDER — SODIUM CHLORIDE 0.9 % IV BOLUS
1000.0000 mL | Freq: Once | INTRAVENOUS | Status: AC
  Administered 2021-07-10: 1000 mL via INTRAVENOUS

## 2021-07-10 MED ORDER — SODIUM CHLORIDE 0.9 % IV SOLN
1000.0000 mL | INTRAVENOUS | Status: DC
  Administered 2021-07-10 (×2): 1000 mL via INTRAVENOUS

## 2021-07-10 NOTE — Progress Notes (Signed)
°   07/09/21 2347  Assess: MEWS Score  Temp (!) 100.7 F (38.2 C)  BP (!) 154/82  Pulse Rate (!) 103  Level of Consciousness Alert  SpO2 93 %  O2 Device Room Air  Assess: MEWS Score  MEWS Temp 1  MEWS Systolic 0  MEWS Pulse 1  MEWS RR 2  MEWS LOC 0  MEWS Score 4  MEWS Score Color Red  Assess: if the MEWS score is Yellow or Red  Were vital signs taken at a resting state? Yes  Focused Assessment Change from prior assessment (see assessment flowsheet)  Does the patient meet 2 or more of the SIRS criteria? Yes  Does the patient have a confirmed or suspected source of infection? Yes  Provider and Rapid Response Notified? Yes  MEWS guidelines implemented *See Row Information* Yes  Take Vital Signs  Increase Vital Sign Frequency  Red: Q 1hr X 4 then Q 4hr X 4, if remains red, continue Q 4hrs  Escalate  MEWS: Escalate Red: discuss with charge nurse/RN and provider, consider discussing with RRT  Notify: Charge Nurse/RN  Name of Charge Nurse/RN Notified TR  Date Charge Nurse/RN Notified 07/09/21  Time Charge Nurse/RN Notified 2353  Notify: Provider  Provider Name/Title [facility]  Date Provider Notified 07/09/21  Time Provider Notified 2350  Notification Type Page  Notification Reason Change in status  Provider response Other (Comment) (pending)  Notify: Rapid Response  Name of Rapid Response RN Notified Erin  Date Rapid Response Notified 07/09/21  Time Rapid Response Notified 2350  Document  Patient Outcome Stabilized after interventions  Progress note created (see row info) Yes  Assess: SIRS CRITERIA  SIRS Temperature  0  SIRS Pulse 1  SIRS Respirations  1  SIRS WBC 0  SIRS Score Sum  2

## 2021-07-10 NOTE — Progress Notes (Signed)
PROGRESS NOTE    Brittney Vance  YPP:[id number] DOB: Feb 08, 1966 DOA: 07/07/2021 PCP: [name], MD  Brief Narrative:  Patient is a 55 year old African-American female with a past medical history significant for but not limited to breast cancer, obstructive sleep apnea, hypothyroidism as well as other comorbidities who presented with nausea vomiting and abdominal pain.  She had acute onset of her symptoms yesterday and her stomach pain was across the umbilicus and left upper quadrant associate with nausea vomiting.  She tried Alka-Seltzer for this but did not do anything for her and eating made it worse.  She is unable to keep anything down and she did not have any fever or diarrhea and because her symptoms did not resolve she went to the ED for further evaluation and CT scan was done and showed acute pancreatitis with no cystic necrosis seen.  He also noted to have cholelithiasis but no acute cholecystitis and no bile ductal dilatation.  She was placed on fluids, pain control antiemetics and admitted for acute pancreatitis.  General surgery was consulted for further evaluation given her cholelithiasis and are planning for laparoscopic gallbladder removal this admission.  Because patient continued to have some pain and nausea they will reevaluate anticipate having surgical intervention tomorrow Friday, 07/10/2021.  Assessment & Plan:   Principal Problem:   Acute pancreatitis  SIRS from Acute Pancreatitis -WBC worsened and went from 9.2 -> 11.8 and she was febrile (100.6) and tachycardic (105) as well as a little bit tachypneic (30) earlier this admission; Now WBC has improved to 9.1 but overnight she was again febrile and had a Temp of 100.7 and became Tachypenic at 39 and Tachycardic at 105 -Lipase Level was 2627 -> 307 -> 54 and has now improved and lipase level is 77 -Was initially n.p.o. but will advance to CLD and advance Diet as Tolerated; remains on a clear liquid diet and will be  n.p.o. at midnight -Pain Control with IV Hydromorphone 0.5 mg q3hprn Severe Pain -Will stop Promethazine 12.5 mg q6hprn and Start Ondansetron 4 mg po/IV and 10 mg Compazine if Zofran ineffective -Continue with supportive care and continue to monitor -She is given 2 L of normal saline boluses and is started on normal saline at 200 MLS per hour but will reduce to 125 MLS per hourand will reduce to 75 mL/hr today  -She is unclear etiology of her pancreatitis but likely could have been from choledocholithiasis and passed stone.  She denies any alcohol use and lipid panel is negative -General surgery has been consulted as below and will defer to them for MRCP; General Surgery planning on taking the patient to the OR today  -Defer to surgery about whether to start antibiotics  Abnormal LFTs -In the setting of Pancreatitis  -Patient's AST went from 96 -> 44 -> 37 -> 33 -ALT went from 186 -> 95 -> 75 -> 55 -Acute Hepatitis Panel Negative -CT Abdomen and Pelvis done and showed "Acute edematous pancreatitis without collection. Cholelithiasis." -Continue to monitor and trend LFTs closely and repeat CMP in the AM  Cholelithaiasis  -Seen on CT Scan  -U/S of the Abd/Pelvis done and showed "Cholelithiasis without cholecystitis. Trace free fluid right upper quadrant, consistent with reactive changes given pancreatitis on recent CT." -May need MRCP if not improving but she had no bile duct dilatation on her CT scan and her common bile duct was 6 mm on ultrasound; she did have multiple shadowing gallstones with the largest measuring 13 mm with no  gallbladder wall thickening or pericholecystic fluid -Will have referral to General Surgery and have them evaluate and they are planning for laparoscopic cholecystectomy this admission once her pancreatitis improves; Plan is for Surgical Intervention today   Hypothyroidism -She takes 75 mcg of levothyroxine daily so we will resume -Checked TSH and was 1.40 and Free  T4 was 0.81  Hypokalemia -Patient's K+ was 2.7 this AM  -Replete with IV Kcl 60 mEQ x1 again and with IV K Phos 30 mmol  -Mag level is now 1.9 -Continue to Monitor and Replete as Necessary -Repeat CMP in the AM   Hypophosphatemia -Patient's phosphorus level was less than 1 yesterday so she is given replacement with IV sodium phosphate 30 mmol and today her phosphorus level is 1.3 -Replete with IV K-Phos 30 mmol today -Continue to monitor and replete as necessary -Repeat phosphorus level in the morning  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 -Outpatient follow up with Oncology  Hyponatremia -Mild and Na+ has gone from 140 -> 138 -> 133 x2 -C/w IVF Hydration with NS but reduced rate from 125 mL/hr to 75 mL/hr -Repeat CMP in the AM  Hyperglycemia -Likely reactive but will evaluate for diabetes mellitus type 2 -Continue monitor CBGs carefully and if necessary will place on sensitive NovoLog sliding scale insulin -Blood sugars ranging from 88-193   Normocytic Anemia -Likely Dilutional Drop -Hgb/Hct went from 14.7/43.5 -> 12.0/34.9 -> 11.6/33.6 -> 11.1/31.2 -Check Anemia Panel in the AM -Continue to Monitor for S/Sx of Bleeding; No overt bleeding note  Hematuria -Initially U/A Negative -Repeat U/A now given nursing stating that patient having Hematuria and Cloudy Urine; repeat urinalysis showed small hemoglobin with greater than 80 ketones, trace protein, no bacteria seen, 0-5 RBCs per high-power field -We will continue IV fluid hydration as above continue to monitor and if persist may need urological evaluation outpatient setting  DVT prophylaxis: Enoxaparin 40 mg sq q24h Code Status: FULL CODE  Family Communication: No family currently at bedside Disposition Plan: Pending further clinical improvement  Status is: Inpatient  The patient will require care spanning > 2 midnights and should be moved to inpatient because: She will need to tolerate diet prior to safe discharge  disposition  Consultants:  General Surgery  Procedures:  RUQ U/S   Antimicrobials:  Anti-infectives (From admission, onward)    Start     Dose/Rate Route Frequency Ordered Stop   07/10/21 0600  cefTRIAXone (ROCEPHIN) 2 g in sodium chloride 0.9 % 100 mL IVPB       Note to Pharmacy: Pharmacy may adjust dosing strength / duration / interval for maximal efficacy   2 g 200 mL/hr over 30 Minutes Intravenous On call to O.R. 07/09/21 1053 07/11/21 0559        Subjective: Seen and examined at bedside and was a little groggy and resting in states that she did had a somewhat rough night.  Awaiting her surgical intervention and she states that she has been bumped from 1:00 to around 3 PM due to a surgical emergency.  Denies any lightheadedness or dizziness.  No nausea or vomiting and no pain currently.  Objective: Vitals:   07/10/21 0418 07/10/21 0425 07/10/21 0500 07/10/21 0748  BP: (!) 151/86   (!) 154/84  Pulse: (!) 102   (!) 102  Resp: 18 19 19 20   Temp: 98.6 F (37 C)   100 F (37.8 C)  TempSrc:    Oral  SpO2: (!) 85% 94%  90%  Weight:  Height:        Intake/Output Summary (Last 24 hours) at 07/10/2021 1151 Last data filed at 07/09/2021 2100 Gross per 24 hour  Intake 438.95 ml  Output --  Net 438.95 ml    Filed Weights   07/07/21 0234  Weight: 61.2 kg   Examination: Physical Exam:  Constitutional: WN/WD African-American female currently in no acute distress appears calm and resting it is somewhat groggy and sleepy Eyes:  Lids and conjunctivae normal, sclerae anicteric  ENMT: External Ears, Nose appear normal. Grossly normal hearing. Mucous membranes are moist.  Neck: Appears normal, supple, no cervical masses, normal ROM, no appreciable thyromegaly;: No appreciable JVD Respiratory: Diminished to auscultation bilaterally, no wheezing, rales, rhonchi or crackles. Normal respiratory effort and patient is not tachypenic. No accessory muscle use.  Unlabored  breathing Cardiovascular: RRR, no murmurs / rubs / gallops. S1 and S2 auscultated.  Slight appreciable extremity edema Abdomen: Soft, non-tender, non-distended. Bowel sounds positive.  GU: Deferred. Musculoskeletal: No clubbing / cyanosis of digits/nails. No joint deformity upper and lower extremities.   Skin: No rashes, lesions, ulcers on limited skin evaluation. No induration; Warm and dry.  Neurologic: CN 2-12 grossly intact with no focal deficits. Romberg sign and cerebellar reflexes not assessed.  Psychiatric: Mildly impaired judgment and insight.  Vitals somnolent and sleepy. Normal mood and appropriate affect.   Data Reviewed: I have personally reviewed following labs and imaging studies  CBC: Recent Labs  Lab 07/07/21 0236 07/08/21 0521 07/09/21 0503 07/10/21 0530  WBC 9.2 11.8* 10.7* 9.1  HGB 14.7 12.0 11.6* 11.1*  HCT 43.5 34.9* 33.6* 31.2*  MCV 88.4 88.4 89.1 86.7  PLT 292 213 180 259    Basic Metabolic Panel: Recent Labs  Lab 07/07/21 0236 07/08/21 0521 07/09/21 0503 07/09/21 1421 07/10/21 0530 07/10/21 0808  NA 140 138 133*  --  133*  --   K 3.5 3.0* 2.9*  --  2.7*  --   CL 102 103 98  --  99  --   CO2 27 27 26   --  26  --   GLUCOSE 193* 109* 101*  --  88  --   BUN 15 7 6   --  6  --   CREATININE 0.77 0.55 0.55  --  0.47  --   CALCIUM 9.5 7.5* 7.9*  --  7.7*  --   MG  --   --   --  2.0  --  1.9  PHOS  --   --   --  <1.0*  --  1.3*    GFR: Estimated Creatinine Clearance: 65 mL/min (by C-G formula based on SCr of 0.47 mg/dL). Liver Function Tests: Recent Labs  Lab 07/07/21 0236 07/08/21 0521 07/09/21 0503 07/10/21 0530  AST 96* 44* 37 27  ALT 186* 95* 75* 55*  ALKPHOS 136* 90 85 87  BILITOT 0.6 0.6 1.2 1.1  PROT 7.7 5.9* 6.0* 5.8*  ALBUMIN 4.5 3.3* 3.2* 3.0*    Recent Labs  Lab 07/07/21 0236 07/08/21 0521 07/09/21 0503 07/10/21 0530  LIPASE 2,627* 307* 54* 33    No results for input(s): AMMONIA in the last 168 hours. Coagulation  Profile: No results for input(s): INR, PROTIME in the last 168 hours. Cardiac Enzymes: No results for input(s): CKTOTAL, CKMB, CKMBINDEX, TROPONINI in the last 168 hours. BNP (last 3 results) No results for input(s): PROBNP in the last 8760 hours. HbA1C: No results for input(s): HGBA1C in the last 72 hours. CBG: No results for input(s):  GLUCAP in the last 168 hours. Lipid Profile: Recent Labs    07/08/21 0521  CHOL 159  HDL 52  LDLCALC 99  TRIG 39  CHOLHDL 3.1    Thyroid Function Tests: No results for input(s): TSH, T4TOTAL, FREET4, T3FREE, THYROIDAB in the last 72 hours. Anemia Panel: No results for input(s): VITAMINB12, FOLATE, FERRITIN, TIBC, IRON, RETICCTPCT in the last 72 hours. Sepsis Labs: No results for input(s): PROCALCITON, LATICACIDVEN in the last 168 hours.  Recent Results (from the past 240 hour(s))  Resp Panel by RT-PCR (Flu A&B, Covid) Nasopharyngeal Swab     Status: None   Collection Time: 07/07/21  5:16 AM   Specimen: Nasopharyngeal Swab; Nasopharyngeal(NP) swabs in vial transport medium  Result Value Ref Range Status   SARS Coronavirus 2 by RT PCR NEGATIVE NEGATIVE Final    Comment: (NOTE) SARS-CoV-2 target nucleic acids are NOT DETECTED.  The SARS-CoV-2 RNA is generally detectable in upper respiratory specimens during the acute phase of infection. The lowest concentration of SARS-CoV-2 viral copies this assay can detect is 138 copies/mL. A negative result does not preclude SARS-Cov-2 infection and should not be used as the sole basis for treatment or other patient management decisions. A negative result may occur with  improper specimen collection/handling, submission of specimen other than nasopharyngeal swab, presence of viral mutation(s) within the areas targeted by this assay, and inadequate number of viral copies(<138 copies/mL). A negative result must be combined with clinical observations, patient history, and epidemiological information. The  expected result is Negative.  Fact Sheet for Patients:  EntrepreneurPulse.com.au  Fact Sheet for Healthcare Providers:  IncredibleEmployment.be  This test is no t yet approved or cleared by the [place] FDA and  has been authorized for detection and/or diagnosis of SARS-CoV-2 by FDA under an Emergency Use Authorization (EUA). This EUA will remain  in effect (meaning this test can be used) for the duration of the COVID-19 declaration under Section 564(b)(1) of the Act, 21 U.S.C.section 360bbb-3(b)(1), unless the authorization is terminated  or revoked sooner.       Influenza A by PCR NEGATIVE NEGATIVE Final   Influenza B by PCR NEGATIVE NEGATIVE Final    Comment: (NOTE) The Xpert Xpress SARS-CoV-2/FLU/RSV plus assay is intended as an aid in the diagnosis of influenza from Nasopharyngeal swab specimens and should not be used as a sole basis for treatment. Nasal washings and aspirates are unacceptable for Xpert Xpress SARS-CoV-2/FLU/RSV testing.  Fact Sheet for Patients: EntrepreneurPulse.com.au  Fact Sheet for Healthcare Providers: IncredibleEmployment.be  This test is not yet approved or cleared by the [place] FDA and has been authorized for detection and/or diagnosis of SARS-CoV-2 by FDA under an Emergency Use Authorization (EUA). This EUA will remain in effect (meaning this test can be used) for the duration of the COVID-19 declaration under Section 564(b)(1) of the Act, 21 U.S.C. section 360bbb-3(b)(1), unless the authorization is terminated or revoked.  Performed at [facility], [address] [place], [place] [address]      RN Pressure Injury Documentation:     Estimated body mass index is 23.91 kg/m as calculated from the following:   Height as of this encounter: 5\' 3"  (1.6 m).   Weight as of this encounter: 61.2 kg.  Malnutrition Type:   Malnutrition  Characteristics:   Nutrition Interventions:    Radiology Studies: No results found.  Scheduled Meds:  Chlorhexidine Gluconate Cloth  6 each Topical Daily   enoxaparin (LOVENOX) injection  40 mg Subcutaneous Q24H  influenza vac split quadrivalent PF  0.5 mL Intramuscular Tomorrow-1000   levothyroxine  75 mcg Oral Q0600   phosphorus  500 mg Oral BID   Continuous Infusions:  sodium chloride 125 mL/hr at 07/09/21 2100   cefTRIAXone (ROCEPHIN)  IV     potassium chloride 10 mEq (07/10/21 1051)    LOS: 2 days   Kerney Elbe, DO [facility] PAGER is on [place]  If 7PM-7AM, please contact night-coverage [email or web address]

## 2021-07-10 NOTE — Progress Notes (Signed)
* Surgery Date in Future *  Subjective: CC: Seen this afternoon. Patient originally planned for surgery today, now planned for tomorrow 12/17.   Patient reports improved upper abdominal pain that is currently a 5/10. She has continued nausea without emesis.   Objective: Vital signs in last 24 hours: Temp:  [98.5 F (36.9 C)-100.7 F (38.2 C)] 98.5 F (36.9 C) (12/16 1154) Pulse Rate:  [91-105] 91 (12/16 1154) Resp:  [18-27] 22 (12/16 1154) BP: (133-170)/(68-91) 133/68 (12/16 1154) SpO2:  [85 %-100 %] 100 % (12/16 1154) Last BM Date: 07/05/21  Intake/Output from previous day: 12/15 0701 - 12/16 0700 In: 439 [I.V.:250; IV Piggyback:188.9] Out: -  Intake/Output this shift: No intake/output data recorded.  PE: Gen:  Alert, appears mildly uncomfortable, pleasant Abd: Soft, mild upper abdominal distension, diffuse upper abdominal tenderness greatest in the epigastrium that is slightly improved from yesterday Psych: A&Ox3  Skin: no rashes noted, warm and dry  Lab Results:  Recent Labs    07/09/21 0503 07/10/21 0530  WBC 10.7* 9.1  HGB 11.6* 11.1*  HCT 33.6* 31.2*  PLT 180 169   BMET Recent Labs    07/09/21 0503 07/10/21 0530  NA 133* 133*  K 2.9* 2.7*  CL 98 99  CO2 26 26  GLUCOSE 101* 88  BUN 6 6  CREATININE 0.55 0.47  CALCIUM 7.9* 7.7*   PT/INR No results for input(s): LABPROT, INR in the last 72 hours. CMP     Component Value Date/Time   NA 133 (L) 07/10/2021 0530   NA 140 05/19/2017 1035   K 2.7 (LL) 07/10/2021 0530   K 3.5 05/19/2017 1035   CL 99 07/10/2021 0530   CO2 26 07/10/2021 0530   CO2 29 05/19/2017 1035   GLUCOSE 88 07/10/2021 0530   GLUCOSE 101 05/19/2017 1035   BUN 6 07/10/2021 0530   BUN 7.0 05/19/2017 1035   CREATININE 0.47 07/10/2021 0530   CREATININE 0.61 03/24/2020 0932   CREATININE 0.7 05/19/2017 1035   CALCIUM 7.7 (L) 07/10/2021 0530   CALCIUM 9.9 05/19/2017 1035   PROT 5.8 (L) 07/10/2021 0530   PROT 7.7 05/19/2017  1035   ALBUMIN 3.0 (L) 07/10/2021 0530   ALBUMIN 4.0 05/19/2017 1035   AST 27 07/10/2021 0530   AST 22 02/20/2019 1307   AST 21 05/19/2017 1035   ALT 55 (H) 07/10/2021 0530   ALT 21 02/20/2019 1307   ALT 16 05/19/2017 1035   ALKPHOS 87 07/10/2021 0530   ALKPHOS 91 05/19/2017 1035   BILITOT 1.1 07/10/2021 0530   BILITOT 0.3 02/20/2019 1307   BILITOT 0.39 05/19/2017 1035   GFRNONAA >60 07/10/2021 0530   GFRNONAA >60 02/20/2019 1307   GFRNONAA CANCELED 01/01/2015 0912   GFRAA >60 02/20/2019 1307   GFRAA CANCELED 01/01/2015 0912   Lipase     Component Value Date/Time   LIPASE 33 07/10/2021 0530    Studies/Results: No results found.  Anti-infectives: Anti-infectives (From admission, onward)    Start     Dose/Rate Route Frequency Ordered Stop   07/10/21 0600  cefTRIAXone (ROCEPHIN) 2 g in sodium chloride 0.9 % 100 mL IVPB       Note to Pharmacy: Pharmacy may adjust dosing strength / duration / interval for maximal efficacy   2 g 200 mL/hr over 30 Minutes Intravenous On call to O.R. 07/09/21 1053 07/11/21 0559        Assessment/Plan Gallstone pancreatitis  - Patient with concern for gallstone pancreatitis. We recommend laparoscopic  cholecystectomy this admission. LFT's downtrending and T. Bili wnl. Her lipase has also normalized. Pain/tenderness improving. Will plan for OR tomorrow with Dr. Donne Hazel. Patient is agreeable to this plan.    ID - none currently. Rocephin on call to OR VTE - SCDs, lovenox FEN - FLD. NPO at midnight. Replace K (2.7) & Phos (1.3). Mg 1.9. IVF per TRH Foley - none   - Per [facility] - Electrolyte abnormalities  HTN Hypothyroidism OSA Depression Hx breast cancer s/p bilateral mastectomies and neoadjuvant chemo   LOS: 2 days    Jillyn Ledger , [facility] 07/10/2021, 2:31 PM Please see Amion for pager number during day hours 7:00am-4:30pm

## 2021-07-10 NOTE — Progress Notes (Signed)
Pharmacy Antibiotic Note  Brittney Vance is a 55 y.o. female admitted on 07/07/2021 with nausea and abdominal pain, found to have acute gallstone pancreatitis.  Pharmacy has been consulted for zosyn dosing.  Plan: Zosyn 3.375g IV q8h (4 hour infusion). F/u LOT and clinical improvement  Height: 5\' 3"  (160 cm) Weight: 61.2 kg (135 lb) IBW/kg (Calculated) : 52.4  Temp (24hrs), Avg:99.7 F (37.6 C), Min:98.5 F (36.9 C), Max:101.5 F (38.6 C)  Recent Labs  Lab 07/07/21 0236 07/08/21 0521 07/09/21 0503 07/10/21 0530  WBC 9.2 11.8* 10.7* 9.1  CREATININE 0.77 0.55 0.55 0.47    Estimated Creatinine Clearance: 65 mL/min (by C-G formula based on SCr of 0.47 mg/dL).    No Known Allergies  Antimicrobials this admission: Zosyn 12/16 >>   Dose adjustments this admission:   Microbiology results: None at this time  Thank you for allowing pharmacy to be a part of this patients care.  Dimple Nanas, PharmD 07/10/2021 4:50 PM

## 2021-07-10 NOTE — Progress Notes (Signed)
°   07/10/21 1611  Assess: MEWS Score  Temp (!) 101.5 F (38.6 C) (Rankin Coolman RN notified of vitals/ in room when taken)  BP (!) 182/96 (Larita Deremer RN notified of vitals/ in room when taken)  Pulse Rate (!) 101  Resp (!) 22  Level of Consciousness Alert  SpO2 97 %  Assess: MEWS Score  MEWS Temp 2  MEWS Systolic 0  MEWS Pulse 1  MEWS RR 1  MEWS LOC 0  MEWS Score 4  MEWS Score Color Red  Assess: if the MEWS score is Yellow or Red  Were vital signs taken at a resting state? Yes  Focused Assessment No change from prior assessment  Does the patient meet 2 or more of the SIRS criteria? Yes  Does the patient have a confirmed or suspected source of infection? Yes  Provider and Rapid Response Notified? Yes  MEWS guidelines implemented *See Row Information* Yes  Treat  MEWS Interventions Escalated (See documentation below)  Pain Scale 0-10  Pain Score 7  Pain Type Acute pain  Pain Location Abdomen  Pain Orientation Mid  Pain Descriptors / Indicators Sharp  Pain Frequency Intermittent  Pain Onset Progressive  Pain Intervention(s) Medication (See eMAR)  Take Vital Signs  Increase Vital Sign Frequency  Red: Q 1hr X 4 then Q 4hr X 4, if remains red, continue Q 4hrs  Escalate  MEWS: Escalate Red: discuss with charge nurse/RN and provider, consider discussing with RRT  Notify: Charge Nurse/RN  Name of Charge Nurse/RN Notified Pamala Hurry, RN  Date Charge Nurse/RN Notified 07/10/21  Time Charge Nurse/RN Notified 1641  Notify: Provider  Provider Name/Title Alfredia Ferguson, MD  Date Provider Notified 07/10/21  Time Provider Notified 1640  Notification Type Call (call recieved by MD @ 1640)  Notification Reason Other (Comment) (Red MEWS)  Provider response See new orders  Date of Provider Response 07/10/21  Time of Provider Response 1640  Notify: Rapid Response  Name of Rapid Response RN Notified Christian, RN  Date Rapid Response Notified 07/10/21  Time Rapid Response Notified 1700  Document   Patient Outcome Stabilized after interventions  Progress note created (see row info) Yes  Assess: SIRS CRITERIA  SIRS Temperature  1  SIRS Pulse 1  SIRS Respirations  1  SIRS WBC 0  SIRS Score Sum  3   Pt in Red MEWS. Protocol implemented. Charge RN aware. MD aware. RR nurse aware. Pt Aox4 at this time.

## 2021-07-10 NOTE — Clinical Social Work Note (Signed)
°  Transition of Care [facility]) Screening Note   Patient Details  Name: Brittney Vance Date of Birth: 1966-02-16   Transition of Care [facility]) CM/SW Contact:    Trish Mage, LCSW Phone Number: 07/10/2021, 1:41 PM    Transition of Care Department [facility]) has reviewed patient and no TOC needs have been identified at this time. We will continue to monitor patient advancement through interdisciplinary progression rounds. If new patient transition needs arise, please place a TOC consult.

## 2021-07-11 ENCOUNTER — Encounter (HOSPITAL_COMMUNITY)
Admission: EM | Disposition: A | Discharge status: Home / Self Care | Payer: Self-pay | Source: Home / Self Care | Attending: Internal Medicine

## 2021-07-11 ENCOUNTER — Encounter (HOSPITAL_COMMUNITY): Payer: Self-pay | Admitting: Internal Medicine

## 2021-07-11 ENCOUNTER — Inpatient Hospital Stay (HOSPITAL_COMMUNITY): Payer: BC Managed Care – PPO

## 2021-07-11 LAB — CBC WITH DIFFERENTIAL/PLATELET
Abs Immature Granulocytes: 0.08 10*3/uL — ABNORMAL HIGH (ref 0.00–0.07)
Basophils Absolute: 0 10*3/uL (ref 0.0–0.1)
Basophils Relative: 0 %
Eosinophils Absolute: 0 10*3/uL (ref 0.0–0.5)
Eosinophils Relative: 0 %
HCT: 34.7 % — ABNORMAL LOW (ref 36.0–46.0)
Hemoglobin: 12.1 g/dL (ref 12.0–15.0)
Immature Granulocytes: 1 %
Lymphocytes Relative: 14 %
Lymphs Abs: 1.4 10*3/uL (ref 0.7–4.0)
MCH: 30.1 pg (ref 26.0–34.0)
MCHC: 34.9 g/dL (ref 30.0–36.0)
MCV: 86.3 fL (ref 80.0–100.0)
Monocytes Absolute: 0.8 10*3/uL (ref 0.1–1.0)
Monocytes Relative: 8 %
Neutro Abs: 7.5 10*3/uL (ref 1.7–7.7)
Neutrophils Relative %: 77 %
Platelets: 226 10*3/uL (ref 150–400)
RBC: 4.02 MIL/uL (ref 3.87–5.11)
RDW: 12.8 % (ref 11.5–15.5)
WBC: 9.9 10*3/uL (ref 4.0–10.5)
nRBC: 0.5 % — ABNORMAL HIGH (ref 0.0–0.2)

## 2021-07-11 LAB — COMPREHENSIVE METABOLIC PANEL
ALT: 51 U/L — ABNORMAL HIGH (ref 0–44)
AST: 28 U/L (ref 15–41)
Albumin: 3.3 g/dL — ABNORMAL LOW (ref 3.5–5.0)
Alkaline Phosphatase: 97 U/L (ref 38–126)
Anion gap: 11 (ref 5–15)
BUN: <5 mg/dL — ABNORMAL LOW (ref 6–20)
CO2: 23 mmol/L (ref 22–32)
Calcium: 8 mg/dL — ABNORMAL LOW (ref 8.9–10.3)
Chloride: 96 mmol/L — ABNORMAL LOW (ref 98–111)
Creatinine, Ser: 0.46 mg/dL (ref 0.44–1.00)
GFR, Estimated: >60 mL/min (ref >60)
Glucose, Bld: 96 mg/dL (ref 70–99)
Potassium: 2.7 mmol/L — CL (ref 3.5–5.1)
Sodium: 130 mmol/L — ABNORMAL LOW (ref 135–145)
Total Bilirubin: 1.2 mg/dL (ref 0.3–1.2)
Total Protein: 6.8 g/dL (ref 6.5–8.1)

## 2021-07-11 LAB — RETICULOCYTES
Immature Retic Fract: 32.3 % — ABNORMAL HIGH (ref 2.3–15.9)
RBC.: 3.92 MIL/uL (ref 3.87–5.11)
Retic Count, Absolute: 94.9 10*3/uL (ref 19.0–186.0)
Retic Ct Pct: 2.4 % (ref 0.4–3.1)

## 2021-07-11 LAB — IRON AND TIBC
Iron: 26 ug/dL — ABNORMAL LOW (ref 28–170)
Saturation Ratios: 12 % (ref 10.4–31.8)
TIBC: 215 ug/dL — ABNORMAL LOW (ref 250–450)
UIBC: 189 ug/dL

## 2021-07-11 LAB — PHOSPHORUS: Phosphorus: 1.3 mg/dL — ABNORMAL LOW (ref 2.5–4.6)

## 2021-07-11 LAB — FOLATE: Folate: 6.9 ng/mL (ref >5.9)

## 2021-07-11 LAB — VITAMIN B12: Vitamin B-12: 1995 pg/mL — ABNORMAL HIGH (ref 180–914)

## 2021-07-11 LAB — FERRITIN: Ferritin: 429 ng/mL — ABNORMAL HIGH (ref 11–307)

## 2021-07-11 LAB — MAGNESIUM: Magnesium: 1.9 mg/dL (ref 1.7–2.4)

## 2021-07-11 SURGERY — LAPAROSCOPIC CHOLECYSTECTOMY
Anesthesia: General

## 2021-07-11 MED ORDER — IOHEXOL 9 MG/ML PO SOLN
500.0000 mL | ORAL | Status: AC
  Administered 2021-07-11 (×2): 500 mL via ORAL

## 2021-07-11 MED ORDER — IOHEXOL 350 MG/ML SOLN
80.0000 mL | Freq: Once | INTRAVENOUS | Status: AC | PRN
  Administered 2021-07-11: 80 mL via INTRAVENOUS

## 2021-07-11 MED ORDER — MAGNESIUM SULFATE 2 GM/50ML IV SOLN
2.0000 g | Freq: Once | INTRAVENOUS | Status: AC
  Administered 2021-07-11: 2 g via INTRAVENOUS
  Filled 2021-07-11: qty 50

## 2021-07-11 MED ORDER — SODIUM CHLORIDE 0.9 % IV BOLUS
1000.0000 mL | Freq: Once | INTRAVENOUS | Status: AC
  Administered 2021-07-11: 1000 mL via INTRAVENOUS

## 2021-07-11 MED ORDER — MIDAZOLAM HCL 2 MG/2ML IJ SOLN
INTRAMUSCULAR | Status: AC
  Filled 2021-07-11: qty 2

## 2021-07-11 MED ORDER — FENTANYL CITRATE (PF) 250 MCG/5ML IJ SOLN
INTRAMUSCULAR | Status: AC
  Filled 2021-07-11: qty 5

## 2021-07-11 MED ORDER — POTASSIUM CHLORIDE 10 MEQ/100ML IV SOLN
10.0000 meq | INTRAVENOUS | Status: AC
  Administered 2021-07-11 (×6): 10 meq via INTRAVENOUS
  Filled 2021-07-11 (×6): qty 100

## 2021-07-11 MED ORDER — POTASSIUM PHOSPHATES 15 MMOLE/5ML IV SOLN
30.0000 mmol | Freq: Once | INTRAVENOUS | Status: AC
  Administered 2021-07-11: 30 mmol via INTRAVENOUS
  Filled 2021-07-11: qty 10

## 2021-07-11 MED ORDER — POTASSIUM CHLORIDE IN NACL 40-0.9 MEQ/L-% IV SOLN
INTRAVENOUS | Status: DC
  Filled 2021-07-11: qty 1000

## 2021-07-11 SURGICAL SUPPLY — 36 items
APPLIER CLIP ROT 10 11.4 M/L (STAPLE) ×2
BAG COUNTER SPONGE SURGICOUNT (BAG) IMPLANT
CABLE HIGH FREQUENCY MONO STRZ (ELECTRODE) IMPLANT
CLIP APPLIE ROT 10 11.4 M/L (STAPLE) ×1 IMPLANT
COVER MAYO STAND STRL (DRAPES) ×2 IMPLANT
DECANTER SPIKE VIAL GLASS SM (MISCELLANEOUS) ×2 IMPLANT
DERMABOND ADVANCED (GAUZE/BANDAGES/DRESSINGS)
DERMABOND ADVANCED .7 DNX12 (GAUZE/BANDAGES/DRESSINGS) IMPLANT
DRAPE C-ARM 42X120 X-RAY (DRAPES) IMPLANT
DRAPE UTILITY XL STRL (DRAPES) ×2 IMPLANT
DRAPE WARM FLUID 44X44 (DRAPES) ×2 IMPLANT
ELECT REM PT RETURN 15FT ADLT (MISCELLANEOUS) ×2 IMPLANT
GLOVE SURG MICRO LTX SZ7.5 (GLOVE) ×2 IMPLANT
GLOVE SURG UNDER LTX SZ8 (GLOVE) ×2 IMPLANT
GOWN STRL REUS W/TWL XL LVL3 (GOWN DISPOSABLE) ×4 IMPLANT
HEMOSTAT SURGICEL 4X8 (HEMOSTASIS) IMPLANT
IRRIG SUCT STRYKERFLOW 2 WTIP (MISCELLANEOUS) ×2
IRRIGATION SUCT STRKRFLW 2 WTP (MISCELLANEOUS) ×1 IMPLANT
KIT BASIN OR (CUSTOM PROCEDURE TRAY) ×2 IMPLANT
KIT TURNOVER KIT A (KITS) IMPLANT
PAD POSITIONING PINK XL (MISCELLANEOUS) IMPLANT
PENCIL SMOKE EVACUATOR (MISCELLANEOUS) IMPLANT
POUCH SPECIMEN RETRIEVAL 10MM (ENDOMECHANICALS) ×2 IMPLANT
PROTECTOR NERVE ULNAR (MISCELLANEOUS) IMPLANT
SCISSORS LAP 5X35 DISP (ENDOMECHANICALS) ×2 IMPLANT
SET CHOLANGIOGRAPH MIX (MISCELLANEOUS) ×2 IMPLANT
SET TUBE SMOKE EVAC HIGH FLOW (TUBING) IMPLANT
SLEEVE XCEL OPT CAN 5 100 (ENDOMECHANICALS) ×2 IMPLANT
SUT MNCRL AB 4-0 PS2 18 (SUTURE) ×2 IMPLANT
TAPE CLOTH 4X10 WHT NS (GAUZE/BANDAGES/DRESSINGS) IMPLANT
TOWEL OR 17X26 10 PK STRL BLUE (TOWEL DISPOSABLE) ×2 IMPLANT
TOWEL OR NON WOVEN STRL DISP B (DISPOSABLE) ×2 IMPLANT
TRAY LAPAROSCOPIC (CUSTOM PROCEDURE TRAY) ×2 IMPLANT
TROCAR BLADELESS OPT 5 100 (ENDOMECHANICALS) ×2 IMPLANT
TROCAR XCEL BLUNT TIP 100MML (ENDOMECHANICALS) ×2 IMPLANT
TROCAR XCEL NON-BLD 11X100MML (ENDOMECHANICALS) IMPLANT

## 2021-07-11 NOTE — Progress Notes (Signed)
PROGRESS NOTE    Brittney Vance  GBE:[id number] DOB: Jul 12, 1966 DOA: 07/07/2021 PCP: [name], MD  Brief Narrative:  Patient is a 55 year old African-American female with a past medical history significant for but not limited to breast cancer, obstructive sleep apnea, hypothyroidism as well as other comorbidities who presented with nausea vomiting and abdominal pain.  She had acute onset of her symptoms yesterday and her stomach pain was across the umbilicus and left upper quadrant associate with nausea vomiting.  She tried Alka-Seltzer for this but did not do anything for her and eating made it worse.  She is unable to keep anything down and she did not have any fever or diarrhea and because her symptoms did not resolve she went to the ED for further evaluation and CT scan was done and showed acute pancreatitis with no cystic necrosis seen.  He also noted to have cholelithiasis but no acute cholecystitis and no bile ductal dilatation.  She was placed on fluids, pain control antiemetics and admitted for acute pancreatitis.  General surgery was consulted for further evaluation given her cholelithiasis and are planning for laparoscopic gallbladder removal this admission.  Because patient continued to have some pain and nausea and patient was supposed to have surgical intervention yesterday but this was delayed due to emergency.  Upon further review today she was febrile overnight became tachycardic and tachypneic.  General surgery evaluated and because she clinically looks worse they ended up repeating a CT scan of the abdomen pelvis which shows pancreatitis with possible necrosis.  Given that she has been febrile they are recommending starting antibiotics which is done yesterday with Zosyn.  Plans for delayed lap cholecystectomy in the outpatient setting now  Assessment & Plan:   Principal Problem:   Acute pancreatitis  SIRS from Acute Pancreatitis with now pancreatic necrosis WBC is resolved  but she continues to have SIRS criteria and became tachypneic, tachycardic and febrile this morning. -Lipase Level was 2627 -> 307 -> 54 and has now improved and lipase level is 33 -General surgery now recommending clear liquid diet -Pain Control with IV Hydromorphone 0.5 mg q3hprn Severe Pain -Will stop Promethazine 12.5 mg q6hprn and Start Ondansetron 4 mg po/IV and 10 mg Compazine if Zofran ineffective -Continue with supportive care and continue to monitor -She is given 2 L of normal saline boluses and is started on normal saline at 200 MLS per hour but will reduce to 125 MLS per hourand will reduce to 75 mL/hr today  -She is unclear etiology of her pancreatitis but likely could have been from choledocholithiasis and passed stone.  She denies any alcohol use and lipid panel is negative -General surgery has been consulted as below and will defer to them for MRCP; General Surgery planning on taking the patient to the OR yesterday however it had to be delayed until today.  Given her worsening clinical status overnight and this morning General surgery canceled the surgery and recommended CT scan of the abdomen pelvis with contrast -Repeat CT scan of the abdomen pelvis contrast showed "New focal area of hypoenhancement in the pancreatic head raises concern for developing necrosis in the setting of acute pancreatitis. There is no organized peripancreatic fluid collection. New bilateral pleural effusions with adjacent atelectasis. Thickening of the distal colonic wall may reflect third-spacing of fluid, though nonspecific infectious or inflammatory colitis could have a similar appearance. Scattered ascites, overall similar in volume. Cholelithiasis without evidence of acute cholecystitis." -She was initiated on IV Zosyn yesterday and  will continue for now -Given her volume status and multiple boluses she is +7.451 L and now we will stop IV fluid hydration altogether; she is bolused a liter this morning given  her fever but now we will hold off any further boluses and any further IV fluid hydration -Will consult GI given this new area of area of hypoenhancement with concern for pancreatic necorsis   Abnormal LFTs -In the setting of Pancreatitis  -Patient's AST went from 96 -> 44 -> 37 -> 33 -> 28 -ALT went from 186 -> 95 -> 75 -> 55 -> 51 -Acute Hepatitis Panel Negative -CT Abdomen and Pelvis done and showed "Acute edematous pancreatitis without collection. Cholelithiasis." -Continue to monitor and trend LFTs closely and repeat CMP in the AM  Cholelithaiasis  -Seen on CT Scan  -U/S of the Abd/Pelvis done and showed "Cholelithiasis without cholecystitis. Trace free fluid right upper quadrant, consistent with reactive changes given pancreatitis on recent CT." -May need MRCP if not improving but she had no bile duct dilatation on her CT scan and her common bile duct was 6 mm on ultrasound; she did have multiple shadowing gallstones with the largest measuring 13 mm with no gallbladder wall thickening or pericholecystic fluid -Will have referral to General Surgery and have them evaluate and they were planning for laparoscopic cholecystectomy this admission once her pancreatitis improves given her clinical decompensation and worsening with fevers a repeat CT scan was done and shows pancreatic necrosis so now general surgery recommends having the cholecystectomy done several weeks  Hypothyroidism -She takes 75 mcg of levothyroxine daily so we will resume -Checked TSH and was 1.40 and Free T4 was 0.81  Hypokalemia -Patient's K+ was 2.7 this AM again -Replete with IV K-Phos 30 mmol as well as IV KCl 60 mEq -Mag level is now 1.9 -Also had initially started her on IV fluid hydration with KCl but will stop given her fluid overload  -Continue to Monitor and Replete as Necessary -Repeat CMP in the AM   Hypophosphatemia -Patient's phosphorus level was less than 1 yesterday so she is given replacement with  IV sodium phosphate 30 mmol and today her phosphorus level is 1.3 again -Replete with IV K-Phos 30 mmol today -Continue to monitor and replete as necessary -Repeat phosphorus level in the morning  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 -Outpatient follow up with Oncology  Hyponatremia -Mild and Na+ has gone from 140 -> 138 -> 133 x2 and was 130 today -We will discontinue IV fluid hydration given the amount of hydration that she is gotten -Repeat CMP in the AM  Hyperglycemia -Likely reactive but will evaluate for diabetes mellitus type 2 -Continue monitor CBGs carefully and if necessary will place on sensitive NovoLog sliding scale insulin -Blood sugars ranging from 88-193   Normocytic Anemia -Likely Dilutional Drop -Hgb/Hct went from 14.7/43.5 -> 12.0/34.9 -> 11.6/33.6 -> 11.1/31.2 and today was 12.1/34.7 -Checked Anemia Panel and showed an iron level of 26, U IBC 189, TIBC of 215, saturation ratios of 12%, ferritin level 429, folate level of 6.9 and vitamin B12 1995 -Continue to Monitor for S/Sx of Bleeding; No overt bleeding note  Hematuria -Initially U/A Negative -Repeat U/A now given nursing stating that patient having Hematuria and Cloudy Urine; repeat urinalysis showed small hemoglobin with greater than 80 ketones, trace protein, no bacteria seen, 0-5 RBCs per high-power field -We will continue IV fluid hydration as above continue to monitor and if persist may need urological evaluation outpatient setting  DVT  prophylaxis: Enoxaparin 40 mg sq q24h Code Status: FULL CODE  Family Communication: No family currently at bedside Disposition Plan: Pending further clinical improvement  Status is: Inpatient  The patient will require care spanning > 2 midnights and should be moved to inpatient because: She will need to tolerate diet prior to safe discharge disposition  Consultants:  General Surgery Gastroenterology  Procedures:  RUQ U/S   Antimicrobials:  Anti-infectives  (From admission, onward)    Start     Dose/Rate Route Frequency Ordered Stop   07/10/21 1745  piperacillin-tazobactam (ZOSYN) IVPB 3.375 g        3.375 g 12.5 mL/hr over 240 Minutes Intravenous Every 8 hours 07/10/21 1646     07/10/21 0600  cefTRIAXone (ROCEPHIN) 2 g in sodium chloride 0.9 % 100 mL IVPB       Note to Pharmacy: Pharmacy may adjust dosing strength / duration / interval for maximal efficacy   2 g 200 mL/hr over 30 Minutes Intravenous On call to O.R. 07/09/21 1053 07/11/21 0559        Subjective: Seen and examined at bedside and was febrile this morning and tachycardic and tachypneic.  Continued some abdominal pain.  Because of this clinical decompensation and general surgery evaluated and recommending CT scan of the abdomen pelvis with contrast which was done and showed concern for pancreatic necrosis.  Given this new finding her surgical intervention is now to be postponed and done at a later date.  She states that she slept okay.  Continues to have some abdominal pain and a little bit of nausea.  No other concerns or complaints at this time.  Objective: Vitals:   07/11/21 0158 07/11/21 0410 07/11/21 0818 07/11/21 1216  BP:  (!) 188/88 (!) 169/68 (!) 174/91  Pulse:  100 (!) 109 88  Resp: 18 (!) 22 (!) 21 17  Temp:  98.4 F (36.9 C) (!) 100.7 F (38.2 C) 98 F (36.7 C)  TempSrc:  Axillary Oral Oral  SpO2:  96% 97% 98%  Weight:      Height:        Intake/Output Summary (Last 24 hours) at 07/11/2021 1518 Last data filed at 07/11/2021 0440 Gross per 24 hour  Intake 420.85 ml  Output --  Net 420.85 ml    Filed Weights   07/07/21 0234  Weight: 61.2 kg   Examination: Physical Exam:  Constitutional: WN/WD African-American female currently in no acute distress appears calm but a little uncomfortable Eyes:  Lids and conjunctivae normal, sclerae anicteric  ENMT: External Ears, Nose appear normal. Grossly normal hearing. Mucous membranes are moist.  Neck: Appears  normal, supple, no cervical masses, normal ROM, no appreciable thyromegaly; no appreciable JVD Respiratory: Diminished to auscultation bilaterally, no wheezing, rales, rhonchi or crackles. Normal respiratory effort and patient is not tachypenic. No accessory muscle use.  Unlabored breathing and not wearing any supplemental oxygen via nasal cannula Cardiovascular: RRR, no murmurs / rubs / gallops. S1 and S2 auscultated.  Trace extremity edema Abdomen: Soft, a little-tender, distended secondary body habitus. Bowel sounds positive.  GU: Deferred. Musculoskeletal: No clubbing / cyanosis of digits/nails. No joint deformity upper and lower extremities.  Skin: No rashes, lesions, ulcers on limited skin evaluation. No induration; Warm and dry.  Neurologic: CN 2-12 grossly intact with no focal deficits.  Romberg sign and cerebellar reflexes not assessed.  Psychiatric: Normal judgment and insight. Alert and oriented x 3. Normal mood and appropriate affect.   Data Reviewed: I have personally reviewed following  labs and imaging studies  CBC: Recent Labs  Lab 07/07/21 0236 07/08/21 0521 07/09/21 0503 07/10/21 0530 07/11/21 0604  WBC 9.2 11.8* 10.7* 9.1 9.9  NEUTROABS  --   --   --   --  7.5  HGB 14.7 12.0 11.6* 11.1* 12.1  HCT 43.5 34.9* 33.6* 31.2* 34.7*  MCV 88.4 88.4 89.1 86.7 86.3  PLT 292 213 180 169 128    Basic Metabolic Panel: Recent Labs  Lab 07/07/21 0236 07/08/21 0521 07/09/21 0503 07/09/21 1421 07/10/21 0530 07/10/21 0808 07/11/21 0604  NA 140 138 133*  --  133*  --  130*  K 3.5 3.0* 2.9*  --  2.7*  --  2.7*  CL 102 103 98  --  99  --  96*  CO2 27 27 26   --  26  --  23  GLUCOSE 193* 109* 101*  --  88  --  96  BUN 15 7 6   --  6  --  <5*  CREATININE 0.77 0.55 0.55  --  0.47  --  0.46  CALCIUM 9.5 7.5* 7.9*  --  7.7*  --  8.0*  MG  --   --   --  2.0  --  1.9 1.9  PHOS  --   --   --  <1.0*  --  1.3* 1.3*    GFR: Estimated Creatinine Clearance: 65 mL/min (by C-G formula  based on SCr of 0.46 mg/dL). Liver Function Tests: Recent Labs  Lab 07/07/21 0236 07/08/21 0521 07/09/21 0503 07/10/21 0530 07/11/21 0604  AST 96* 44* 37 27 28  ALT 186* 95* 75* 55* 51*  ALKPHOS 136* 90 85 87 97  BILITOT 0.6 0.6 1.2 1.1 1.2  PROT 7.7 5.9* 6.0* 5.8* 6.8  ALBUMIN 4.5 3.3* 3.2* 3.0* 3.3*    Recent Labs  Lab 07/07/21 0236 07/08/21 0521 07/09/21 0503 07/10/21 0530  LIPASE 2,627* 307* 54* 33    No results for input(s): AMMONIA in the last 168 hours. Coagulation Profile: No results for input(s): INR, PROTIME in the last 168 hours. Cardiac Enzymes: No results for input(s): CKTOTAL, CKMB, CKMBINDEX, TROPONINI in the last 168 hours. BNP (last 3 results) No results for input(s): PROBNP in the last 8760 hours. HbA1C: No results for input(s): HGBA1C in the last 72 hours. CBG: No results for input(s): GLUCAP in the last 168 hours. Lipid Profile: No results for input(s): CHOL, HDL, LDLCALC, TRIG, CHOLHDL, LDLDIRECT in the last 72 hours.  Thyroid Function Tests: No results for input(s): TSH, T4TOTAL, FREET4, T3FREE, THYROIDAB in the last 72 hours. Anemia Panel: Recent Labs    07/11/21 0604  VITAMINB12 1,995*  FOLATE 6.9  FERRITIN 429*  TIBC 215*  IRON 26*  RETICCTPCT 2.4   Sepsis Labs: No results for input(s): PROCALCITON, LATICACIDVEN in the last 168 hours.  Recent Results (from the past 240 hour(s))  Resp Panel by RT-PCR (Flu A&B, Covid) Nasopharyngeal Swab     Status: None   Collection Time: 07/07/21  5:16 AM   Specimen: Nasopharyngeal Swab; Nasopharyngeal(NP) swabs in vial transport medium  Result Value Ref Range Status   SARS Coronavirus 2 by RT PCR NEGATIVE NEGATIVE Final    Comment: (NOTE) SARS-CoV-2 target nucleic acids are NOT DETECTED.  The SARS-CoV-2 RNA is generally detectable in upper respiratory specimens during the acute phase of infection. The lowest concentration of SARS-CoV-2 viral copies this assay can detect is 138 copies/mL. A  negative result does not preclude SARS-Cov-2 infection and should not  be used as the sole basis for treatment or other patient management decisions. A negative result may occur with  improper specimen collection/handling, submission of specimen other than nasopharyngeal swab, presence of viral mutation(s) within the areas targeted by this assay, and inadequate number of viral copies(<138 copies/mL). A negative result must be combined with clinical observations, patient history, and epidemiological information. The expected result is Negative.  Fact Sheet for Patients:  EntrepreneurPulse.com.au  Fact Sheet for Healthcare Providers:  IncredibleEmployment.be  This test is no t yet approved or cleared by the [place] FDA and  has been authorized for detection and/or diagnosis of SARS-CoV-2 by FDA under an Emergency Use Authorization (EUA). This EUA will remain  in effect (meaning this test can be used) for the duration of the COVID-19 declaration under Section 564(b)(1) of the Act, 21 U.S.C.section 360bbb-3(b)(1), unless the authorization is terminated  or revoked sooner.       Influenza A by PCR NEGATIVE NEGATIVE Final   Influenza B by PCR NEGATIVE NEGATIVE Final    Comment: (NOTE) The Xpert Xpress SARS-CoV-2/FLU/RSV plus assay is intended as an aid in the diagnosis of influenza from Nasopharyngeal swab specimens and should not be used as a sole basis for treatment. Nasal washings and aspirates are unacceptable for Xpert Xpress SARS-CoV-2/FLU/RSV testing.  Fact Sheet for Patients: EntrepreneurPulse.com.au  Fact Sheet for Healthcare Providers: IncredibleEmployment.be  This test is not yet approved or cleared by the [place] FDA and has been authorized for detection and/or diagnosis of SARS-CoV-2 by FDA under an Emergency Use Authorization (EUA). This EUA will remain in effect (meaning this test can  be used) for the duration of the COVID-19 declaration under Section 564(b)(1) of the Act, 21 U.S.C. section 360bbb-3(b)(1), unless the authorization is terminated or revoked.  Performed at [facility], [address] [place], [place] [address]      RN Pressure Injury Documentation:     Estimated body mass index is 23.91 kg/m as calculated from the following:   Height as of this encounter: 5\' 3"  (1.6 m).   Weight as of this encounter: 61.2 kg.  Malnutrition Type:   Malnutrition Characteristics:   Nutrition Interventions:    Radiology Studies: CT ABDOMEN PELVIS W CONTRAST  Result Date: 07/11/2021 CLINICAL DATA:  Pancreatitis EXAM: CT ABDOMEN AND PELVIS WITH CONTRAST TECHNIQUE: Multidetector CT imaging of the abdomen and pelvis was performed using the standard protocol following bolus administration of intravenous contrast. CONTRAST:  77mL OMNIPAQUE IOHEXOL 350 MG/ML SOLN COMPARISON:  CT abdomen/pelvis 07/07/2021 FINDINGS: Lower chest: There are new small bilateral pleural effusions with adjacent atelectasis. The imaged heart is unremarkable. Hepatobiliary: The liver is unremarkable. Multiple gas containing gallstones are again seen. There is no evidence of acute cholecystitis. There is no biliary ductal dilatation. Pancreas: The pancreas is edematous with surrounding stranding and fluid consistent with acute pancreatitis. There is new focal hypoenhancement of the pancreatic head raising concern for developing necrosis (2-33). There is no organized peripancreatic collection. Spleen: Unremarkable. Adrenals/Urinary Tract: The adrenals are unremarkable. A subcentimeter hypodense lesion in the left kidney is too small to characterize but likely reflects a small cyst. There are no stones. There is no hydronephrosis or hydroureter. The bladder is unremarkable. Stomach/Bowel: There is a small hiatal hernia. Stomach is otherwise unremarkable. There is mild diffuse thickening  of the colonic wall involving the distal transverse, descending, and sigmoid colon which may reflect third-spacing of fluid. There is diverticulosis throughout the left colon. There is no evidence of obstruction.  Vascular/Lymphatic: There is scattered calcified atherosclerotic plaque in the nonaneurysmal abdominal aorta. The major branch vessels are patent. The main portal and splenic veins are patent. There is no abdominal or pelvic lymphadenopathy. 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 The adnexa are unremarkable. Other: There is scattered ascites, overall similar in volume to the prior study. There is no free intraperitoneal air. Small locules of gas in the left abdominal wall subcutaneous fat are likely iatrogenic. Musculoskeletal: There is no acute osseous abnormality or aggressive osseous lesion. IMPRESSION: 1. New focal area of hypoenhancement in the pancreatic head raises concern for developing necrosis in the setting of acute pancreatitis. There is no organized peripancreatic fluid collection. 2. New bilateral pleural effusions with adjacent atelectasis. 3. Thickening of the distal colonic wall may reflect third-spacing of fluid, though nonspecific infectious or inflammatory colitis could have a similar appearance. 4. Scattered ascites, overall similar in volume. 5. Cholelithiasis without evidence of acute cholecystitis. Electronically Signed   By: Valetta Mole M.D.   On: 07/11/2021 11:11    Scheduled Meds:  Chlorhexidine Gluconate Cloth  6 each Topical Daily   enoxaparin (LOVENOX) injection  40 mg Subcutaneous Q24H   indocyanine green  1 mg Intravenous On Call to OR   influenza vac split quadrivalent PF  0.5 mL Intramuscular Tomorrow-1000   levothyroxine  75 mcg Oral Q0600   Continuous Infusions:  piperacillin-tazobactam (ZOSYN)  IV 3.375 g (07/11/21 1104)   potassium chloride 10 mEq (07/11/21 1404)    potassium PHOSPHATE IVPB (in mmol) 30 mmol (07/11/21 1216)    LOS: 3 days   Kerney Elbe, DO [facility] PAGER is on AMION  If 7PM-7AM, please contact night-coverage [email or web address]

## 2021-07-11 NOTE — Progress Notes (Signed)
°   07/11/21 0410  Assess: MEWS Score  Temp 98.4 F (36.9 C)  BP (!) 188/88  Pulse Rate 100  Resp (!) 22  Level of Consciousness Alert  SpO2 96 %  O2 Device Room Air  Assess: MEWS Score  MEWS Temp 0  MEWS Systolic 0  MEWS Pulse 0  MEWS RR 1  MEWS LOC 0  MEWS Score 1  MEWS Score Color Green  Assess: if the MEWS score is Yellow or Red  Were vital signs taken at a resting state? Yes  Focused Assessment No change from prior assessment  Does the patient meet 2 or more of the SIRS criteria? Yes  Does the patient have a confirmed or suspected source of infection? Yes  Provider and Rapid Response Notified? No  MEWS guidelines implemented *See Row Information* Yes  Treat  MEWS Interventions Administered prn meds/treatments  Pain Scale 0-10  Pain Score 2  Faces Pain Scale 0  Pain Type Acute pain  Pain Location Abdomen  Pain Orientation Mid  Pain Descriptors / Indicators Aching;Discomfort  Pain Frequency Intermittent  Pain Onset Gradual  Patients Stated Pain Goal 2  Pain Intervention(s) Medication (See eMAR)  Multiple Pain Sites No  Interventions Relaxation  Document  Patient Outcome Stabilized after interventions  Assess: SIRS CRITERIA  SIRS Temperature  0  SIRS Pulse 1  SIRS Respirations  1  SIRS WBC 0  SIRS Score Sum  2   Pt MEWS score resolved, now GREEN.  HR remains slightly tachycardic.  Respirations slightly elevated.  Pt has been up to bathroom recently.  PRN medication given for hypertension. Will continue to monitor.

## 2021-07-11 NOTE — Progress Notes (Addendum)
Day of Surgery   Subjective/Chief Complaint: Febrile overnight, tachy/increased rr, ab pain this am, nauseated   Objective: Vital signs in last 24 hours: Temp:  [98.3 F (36.8 C)-101.5 F (38.6 C)] 98.4 F (36.9 C) (12/17 0410) Pulse Rate:  [91-104] 100 (12/17 0410) Resp:  [18-22] 22 (12/17 0410) BP: (133-194)/(68-114) 188/88 (12/17 0410) SpO2:  [90 %-100 %] 96 % (12/17 0410) Last BM Date: 07/10/21  Intake/Output from previous day: 12/16 0701 - 12/17 0700 In: 420.9 [I.V.:370.9; IV Piggyback:50] Out: -  Intake/Output this shift: No intake/output data recorded.  GI: soft tender epigastrium, nondistended  Lab Results:  Recent Labs    07/10/21 0530 07/11/21 0604  WBC 9.1 9.9  HGB 11.1* 12.1  HCT 31.2* 34.7*  PLT 169 226   BMET Recent Labs    07/09/21 0503 07/10/21 0530  NA 133* 133*  K 2.9* 2.7*  CL 98 99  CO2 26 26  GLUCOSE 101* 88  BUN 6 6  CREATININE 0.55 0.47  CALCIUM 7.9* 7.7*   PT/INR No results for input(s): LABPROT, INR in the last 72 hours. ABG No results for input(s): PHART, HCO3 in the last 72 hours.  Invalid input(s): PCO2, PO2  Studies/Results: No results found.  Anti-infectives: Anti-infectives (From admission, onward)    Start     Dose/Rate Route Frequency Ordered Stop   07/10/21 1745  piperacillin-tazobactam (ZOSYN) IVPB 3.375 g        3.375 g 12.5 mL/hr over 240 Minutes Intravenous Every 8 hours 07/10/21 1646     07/10/21 0600  cefTRIAXone (ROCEPHIN) 2 g in sodium chloride 0.9 % 100 mL IVPB       Note to Pharmacy: Pharmacy may adjust dosing strength / duration / interval for maximal efficacy   2 g 200 mL/hr over 30 Minutes Intravenous On call to O.R. 07/09/21 1053 07/11/21 0559       Assessment/Plan: Gallstone pancreatitis  - this am she appears clinically worse with fever. Has not had cholecystitis. I am concerned pancreatitis is causing this.  I am going to cancel surgery for today and obtain a ct scan. This has been  discussed with her.  ID - none currently. Re-evaluate after ct scan VTE - SCDs, lovenox FEN - NPO Foley - none   - Per TRH - Electrolyte abnormalities  HTN Hypothyroidism OSA Depression Hx breast cancer s/p bilateral mastectomies and neoadjuvant chemo  Rolm Bookbinder 07/11/2021   Addendum: ct with worse pancreatitis, possible necrosis, with fevers etc would start abx. Now will get delayed lap chole- not this admission.  Will follow with you

## 2021-07-12 LAB — CBC WITH DIFFERENTIAL/PLATELET
Abs Immature Granulocytes: 0.07 10*3/uL (ref 0.00–0.07)
Basophils Absolute: 0 10*3/uL (ref 0.0–0.1)
Basophils Relative: 0 %
Eosinophils Absolute: 0.1 10*3/uL (ref 0.0–0.5)
Eosinophils Relative: 1 %
HCT: 34.8 % — ABNORMAL LOW (ref 36.0–46.0)
Hemoglobin: 11.7 g/dL — ABNORMAL LOW (ref 12.0–15.0)
Immature Granulocytes: 1 %
Lymphocytes Relative: 13 %
Lymphs Abs: 1.3 10*3/uL (ref 0.7–4.0)
MCH: 30 pg (ref 26.0–34.0)
MCHC: 33.6 g/dL (ref 30.0–36.0)
MCV: 89.2 fL (ref 80.0–100.0)
Monocytes Absolute: 1 10*3/uL (ref 0.1–1.0)
Monocytes Relative: 10 %
Neutro Abs: 7.8 10*3/uL — ABNORMAL HIGH (ref 1.7–7.7)
Neutrophils Relative %: 75 %
Platelets: 234 10*3/uL (ref 150–400)
RBC: 3.9 MIL/uL (ref 3.87–5.11)
RDW: 13.5 % (ref 11.5–15.5)
WBC: 10.4 10*3/uL (ref 4.0–10.5)
nRBC: 0.5 % — ABNORMAL HIGH (ref 0.0–0.2)

## 2021-07-12 LAB — COMPREHENSIVE METABOLIC PANEL
ALT: 39 U/L (ref 0–44)
AST: 23 U/L (ref 15–41)
Albumin: 2.9 g/dL — ABNORMAL LOW (ref 3.5–5.0)
Alkaline Phosphatase: 95 U/L (ref 38–126)
Anion gap: 10 (ref 5–15)
BUN: 7 mg/dL (ref 6–20)
CO2: 25 mmol/L (ref 22–32)
Calcium: 8.2 mg/dL — ABNORMAL LOW (ref 8.9–10.3)
Chloride: 96 mmol/L — ABNORMAL LOW (ref 98–111)
Creatinine, Ser: 0.48 mg/dL (ref 0.44–1.00)
GFR, Estimated: >60 mL/min (ref >60)
Glucose, Bld: 93 mg/dL (ref 70–99)
Potassium: 3.5 mmol/L (ref 3.5–5.1)
Sodium: 131 mmol/L — ABNORMAL LOW (ref 135–145)
Total Bilirubin: 1.1 mg/dL (ref 0.3–1.2)
Total Protein: 6.3 g/dL — ABNORMAL LOW (ref 6.5–8.1)

## 2021-07-12 LAB — MAGNESIUM: Magnesium: 2.2 mg/dL (ref 1.7–2.4)

## 2021-07-12 LAB — PHOSPHORUS: Phosphorus: 2.1 mg/dL — ABNORMAL LOW (ref 2.5–4.6)

## 2021-07-12 LAB — LIPASE, BLOOD: Lipase: 35 U/L (ref 11–51)

## 2021-07-12 MED ORDER — SENNOSIDES-DOCUSATE SODIUM 8.6-50 MG PO TABS
1.0000 | ORAL_TABLET | Freq: Two times a day (BID) | ORAL | Status: DC
  Administered 2021-07-12 – 2021-07-20 (×16): 1 via ORAL
  Filled 2021-07-12 (×16): qty 1

## 2021-07-12 MED ORDER — SODIUM CHLORIDE 0.9 % IV SOLN: INTRAVENOUS | Status: DC

## 2021-07-12 MED ORDER — K PHOS MONO-SOD PHOS DI & MONO 155-852-130 MG PO TABS
500.0000 mg | ORAL_TABLET | Freq: Two times a day (BID) | ORAL | Status: AC
  Administered 2021-07-12 (×2): 500 mg via ORAL
  Filled 2021-07-12 (×2): qty 2

## 2021-07-12 MED ORDER — ENSURE ENLIVE PO LIQD
237.0000 mL | Freq: Two times a day (BID) | ORAL | Status: DC
  Administered 2021-07-16: 13:00:00 237 mL via ORAL

## 2021-07-12 NOTE — Consult Note (Signed)
Referring Provider:  [facility] Primary Care Physician:  [name], MD Primary Gastroenterologist: Unassigned/[facility] but was seen by [facility] in the past.  Reason for Consultation: Necrotizing pancreatitis  HPI: Brittney Vance is a 55 y.o. female with past medical history of breast cancer diagnosed in 2015 status postmastectomy, history of depression hypothyroidism presented to the hospital on July 07, 2021 with abdominal pain, nausea and vomiting.  Initial CT scan showed pancreatitis with gallstones without any biliary obstruction.  Patient was treated with supportive care and initial plan was for laparoscopic cholecystectomy yesterday but she started noticing worsening abdominal pain.  Repeat CT scan yesterday showed developing necrosis in the pancreatic head in setting of acute pancreatitis.  Also showed bilateral pleural effusions.  GI is consulted for further evaluation.  Blood work today showed normal LFTs.  Normal BUN and creatinine.  CBC showed mild anemia with hemoglobin of 11.7 with otherwise normal.  Normal lipase.  Lipid panel on admission was normal.  Patient seen and examined at bedside today.  Abdominal pain has improved.  Denies any nausea or vomiting.  Denies chills.  Denies blood in the stool or black stool.    Past Medical History:  Diagnosis Date   Breast cancer ([place]) 2015   left, triple negative    Depression 09/23/2014   Fibroids    Hypertension    Hypothyroid    Sleep apnea     Past Surgical History:  Procedure Laterality Date   AXILLARY LYMPH NODE BIOPSY Left 10/30/2013   Procedure: AXILLARY LYMPH NODE BIOPSY;  Surgeon: [name], MD;  Location: [facility];  Service: General;  Laterality: Left;   BREAST BIOPSY Left 04/24/2013   BREAST RECONSTRUCTION WITH PLACEMENT OF TISSUE EXPANDER AND FLEX HD (ACELLULAR HYDRATED DERMIS) Bilateral 10/30/2013   Procedure: BILATERAL BREAST RECONSTRUCTION WITH PLACEMENT OF TISSUE EXPANDER AND FLEX HD  (ACELLULAR HYDRATED DERMIS);  Surgeon: [name], MD;  Location: [place];  Service: Plastics;  Laterality: Bilateral;   MASTECTOMY     PORT-A-CATH REMOVAL Right 10/30/2013   Procedure: REMOVAL PORT-A-CATH;  Surgeon: [name], MD;  Location: [place];  Service: General;  Laterality: Right;   PORTACATH PLACEMENT Right 04/27/2013   Procedure: ULTRASOUND GUIDED PORT INSERTION WITH FLUOROSCOPY;  Surgeon: [name], MD;  Location: [facility];  Service: General;  Laterality: Right;   TOTAL MASTECTOMY Bilateral 10/30/2013   Procedure: TOTAL MASTECTOMY;  Surgeon: [name], MD;  Location: [place];  Service: General;  Laterality: Bilateral;   TUBAL LIGATION      Prior to Admission medications   Medication Sig Start Date End Date Taking? Authorizing Provider  acetaminophen (TYLENOL) 650 MG CR tablet Take 650 mg by mouth every 6 (six) hours as needed for pain.   Yes [provider]  levothyroxine (SYNTHROID) 75 MCG tablet TAKE 1 TABLET BY MOUTH EVERY DAY Patient taking differently: Take 75 mcg by mouth daily before breakfast. 11/08/20  Yes [name], MD  methocarbamol (ROBAXIN) 500 MG tablet Take 500 mg by mouth 3 (three) times daily as needed for muscle spasms.   Yes [provider]  Zoster Vaccine Adjuvanted [facility]) injection Inject 0.5 mLs into the muscle. 04/23/21   Carlyle Basques, MD    Scheduled Meds:  Chlorhexidine Gluconate Cloth  6 each Topical Daily   enoxaparin (LOVENOX) injection  40 mg Subcutaneous Q24H   feeding supplement  237 mL Oral BID BM   influenza vac split quadrivalent PF  0.5 mL Intramuscular Tomorrow-1000  levothyroxine  75 mcg Oral Q0600   phosphorus  500 mg Oral BID   Continuous Infusions:  piperacillin-tazobactam (ZOSYN)  IV 3.375 g (07/12/21 0303)   PRN Meds:.acetaminophen **OR** acetaminophen, alum & mag hydroxide-simeth, hydrALAZINE, HYDROmorphone (DILAUDID) injection, lip balm, ondansetron **OR** ondansetron (ZOFRAN) IV,  oxyCODONE, prochlorperazine **OR** prochlorperazine  Allergies as of 07/07/2021   (No Known Allergies)    Family History  Problem Relation Age of Onset   Hypertension Mother    Diabetes Mother    Alzheimer's disease Mother    Stroke Mother    Hypertension Father    Diabetes Father    Hypertension Brother    Epilepsy Son 4       being worked up for autism   Sleep apnea Son    Thyroid disease Neg Hx     Social History   Socioeconomic History   Marital status: Married    Spouse name: [name]    Number of children: 2   Years of education: Masters    Highest education level: Not on file  Occupational History   Occupation: Chiropodist: UNEMPLOYED    Comment: [employer]  Tobacco Use   Smoking status: Never   Smokeless tobacco: Never  Vaping Use   Vaping Use: Never used  Substance and Sexual Activity   Alcohol use: Not Currently   Drug use: No   Sexual activity: Yes    Partners: Male    Birth control/protection: Post-menopausal    Comment: menarche age 84, first birth age 72, P2, post menopausal  Other Topics Concern   Not on file  Social History Narrative   1-2 caffeine drinks per day. No regular exercise.   Social Determinants of Health   Financial Resource Strain: Not on file  Food Insecurity: Not on file  Transportation Needs: Not on file  Physical Activity: Not on file  Stress: Not on file  Social Connections: Not on file  Intimate Partner Violence: Not on file    Review of Systems: 12 point review of system is done which is negative except as mentioned in HPI.  Physical Exam: Vital signs: Vitals:   07/12/21 0305 07/12/21 0750  BP: (!) 170/84 (!) 174/78  Pulse: (!) 103 (!) 107  Resp: 18 20  Temp: 97.9 F (36.6 C) 99 F (37.2 C)  SpO2: 98% 100%   Last BM Date: 07/10/21 General:   Alert,  Well-developed, well-nourished, pleasant and cooperative in NAD HEENT : NS, AT, EOMI Lungs: Mild basilar crackles, no acute distress. Heart:  Tachycardia noted Abdomen: Mild epigastric discomfort, minimal distention, bowel sounds present.  No peritoneal signs Neuro : Alert and oriented x3 Psych : Affect normal Lower extremity : No significant edema Rectal:  Deferred  GI:  Lab Results: Recent Labs    07/10/21 0530 07/11/21 0604 07/12/21 0535  WBC 9.1 9.9 10.4  HGB 11.1* 12.1 11.7*  HCT 31.2* 34.7* 34.8*  PLT 169 226 234   BMET Recent Labs    07/10/21 0530 07/11/21 0604 07/12/21 0535  NA 133* 130* 131*  K 2.7* 2.7* 3.5  CL 99 96* 96*  CO2 26 23 25   GLUCOSE 88 96 93  BUN 6 <5* 7  CREATININE 0.47 0.46 0.48  CALCIUM 7.7* 8.0* 8.2*   LFT Recent Labs    07/12/21 0535  PROT 6.3*  ALBUMIN 2.9*  AST 23  ALT 39  ALKPHOS 95  BILITOT 1.1   PT/INR No results for input(s): LABPROT, INR in the last  72 hours.   Studies/Results: CT ABDOMEN PELVIS W CONTRAST  Result Date: 07/11/2021 CLINICAL DATA:  Pancreatitis EXAM: CT ABDOMEN AND PELVIS WITH CONTRAST TECHNIQUE: Multidetector CT imaging of the abdomen and pelvis was performed using the standard protocol following bolus administration of intravenous contrast. CONTRAST:  77mL OMNIPAQUE IOHEXOL 350 MG/ML SOLN COMPARISON:  CT abdomen/pelvis 07/07/2021 FINDINGS: Lower chest: There are new small bilateral pleural effusions with adjacent atelectasis. The imaged heart is unremarkable. Hepatobiliary: The liver is unremarkable. Multiple gas containing gallstones are again seen. There is no evidence of acute cholecystitis. There is no biliary ductal dilatation. Pancreas: The pancreas is edematous with surrounding stranding and fluid consistent with acute pancreatitis. There is new focal hypoenhancement of the pancreatic head raising concern for developing necrosis (2-33). There is no organized peripancreatic collection. Spleen: Unremarkable. Adrenals/Urinary Tract: The adrenals are unremarkable. A subcentimeter hypodense lesion in the left kidney is too small to characterize but  likely reflects a small cyst. There are no stones. There is no hydronephrosis or hydroureter. The bladder is unremarkable. Stomach/Bowel: There is a small hiatal hernia. Stomach is otherwise unremarkable. There is mild diffuse thickening of the colonic wall involving the distal transverse, descending, and sigmoid colon which may reflect third-spacing of fluid. There is diverticulosis throughout the left colon. There is no evidence of obstruction. Vascular/Lymphatic: There is scattered calcified atherosclerotic plaque in the nonaneurysmal abdominal aorta. The major branch vessels are patent. The main portal and splenic veins are patent. There is no abdominal or pelvic lymphadenopathy. 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 The adnexa are unremarkable. Other: There is scattered ascites, overall similar in volume to the prior study. There is no free intraperitoneal air. Small locules of gas in the left abdominal wall subcutaneous fat are likely iatrogenic. Musculoskeletal: There is no acute osseous abnormality or aggressive osseous lesion. IMPRESSION: 1. New focal area of hypoenhancement in the pancreatic head raises concern for developing necrosis in the setting of acute pancreatitis. There is no organized peripancreatic fluid collection. 2. New bilateral pleural effusions with adjacent atelectasis. 3. Thickening of the distal colonic wall may reflect third-spacing of fluid, though nonspecific infectious or inflammatory colitis could have a similar appearance. 4. Scattered ascites, overall similar in volume. 5. Cholelithiasis without evidence of acute cholecystitis. Electronically Signed   By: Valetta Mole M.D.   On: 07/11/2021 11:11    Impression/Plan: -Acute necrotizing pancreatitis.  Most likely gallstone related.  No significant alcohol use.  Normal triglycerides.  Recommendations -------------------------- -Patient is  feeling better.  Okay to advance diet to soft later today if continues to do well  -IV hydration at 75 cc/h -Ambulation -GI will follow      LOS: 4 days   Otis Brace  MD, FACP 07/12/2021, 9:58 AM  Contact #  [phone]

## 2021-07-12 NOTE — Progress Notes (Signed)
PROGRESS NOTE    Brittney Vance  ZLD:[id number] DOB: 1965/09/02 DOA: 07/07/2021 PCP: [name], MD  Brief Narrative:  Patient is a 55 year old African-American female with a past medical history significant for but not limited to breast cancer, obstructive sleep apnea, hypothyroidism as well as other comorbidities who presented with nausea vomiting and abdominal pain.  She had acute onset of her symptoms yesterday and her stomach pain was across the umbilicus and left upper quadrant associate with nausea vomiting.  She tried Alka-Seltzer for this but did not do anything for her and eating made it worse.  She is unable to keep anything down and she did not have any fever or diarrhea and because her symptoms did not resolve she went to the ED for further evaluation and CT scan was done and showed acute pancreatitis with no cystic necrosis seen.  He also noted to have cholelithiasis but no acute cholecystitis and no bile ductal dilatation.  She was placed on fluids, pain control antiemetics and admitted for acute pancreatitis.  General surgery was consulted for further evaluation given her cholelithiasis and are planning for laparoscopic gallbladder removal this admission.  Because patient continued to have some pain and nausea and patient was supposed to have surgical intervention yesterday but this was delayed due to emergency.  Upon further review today she was febrile overnight became tachycardic and tachypneic.  General surgery evaluated and because she clinically looks worse they ended up repeating a CT scan of the abdomen pelvis which shows pancreatitis with possible necrosis.  Given that she has been febrile they are recommending starting antibiotics which is done yesterday with Zosyn.  Plans for delayed lap cholecystectomy in the outpatient setting now  Assessment & Plan:   Principal Problem:   Acute pancreatitis  SIRS from Acute Pancreatitis with now pancreatic necrosis WBC is resolved  but she continues to have SIRS criteria and became tachypneic, tachycardic and febrile this morning. -Lipase Level was 2627 -> 307 -> 54 and has now improved and lipase level is 33 -> 35 -General surgery now recommending clear liquid diet -Pain Control with IV Hydromorphone 0.5 mg q3hprn Severe Pain -Will stop Promethazine 12.5 mg q6hprn and Start Ondansetron 4 mg po/IV and 10 mg Compazine if Zofran ineffective -Continue with supportive care and continue to monitor -Given 2 Liters on Admission -She is unclear etiology of her pancreatitis but likely could have been from choledocholithiasis and passed stone.  She denies any alcohol use and lipid panel is negative -General surgery has been consulted as below and will defer to them for MRCP; General Surgery planning on taking the patient to the OR yesterday however it had to be delayed until today.  Given her worsening clinical status overnight and this morning General surgery canceled the surgery and recommended CT scan of the abdomen pelvis with contrast -Repeat CT scan of the abdomen pelvis contrast showed "New focal area of hypoenhancement in the pancreatic head raises concern for developing necrosis in the setting of acute pancreatitis. There is no organized peripancreatic fluid collection. New bilateral pleural effusions with adjacent atelectasis. Thickening of the distal colonic wall may reflect third-spacing of fluid, though nonspecific infectious or inflammatory colitis could have a similar appearance. Scattered ascites, overall similar in volume. Cholelithiasis without evidence of acute cholecystitis." -She was initiated on IV Zosyn yesterday and will continue for now -Given her volume status and multiple boluses she is +7.451 L and now we will stop IV fluid hydration altogether; she is bolused a  liter this morning given her fever but now we will hold off any further boluses and any further IV fluid hydration -Will consult GI given this new area of  area of hypoenhancement with concern for pancreatic necorsis and GI recommends diet advancement if tolerated later today and they recommended starting the patient back on normal saline at 75 MLS per hour  Abnormal LFTs -In the setting of Pancreatitis  -Patient's AST went from 96 -> 44 -> 37 -> 33 -> 28 -> 23 -ALT went from 186 -> 95 -> 75 -> 55 -> 51 -> 39 -Acute Hepatitis Panel Negative -CT Abdomen and Pelvis done and showed "Acute edematous pancreatitis without collection. Cholelithiasis." -Continue to monitor and trend LFTs closely and repeat CMP in the AM  Cholelithaiasis  -Seen on CT Scan  -U/S of the Abd/Pelvis done and showed "Cholelithiasis without cholecystitis. Trace free fluid right upper quadrant, consistent with reactive changes given pancreatitis on recent CT." -May need MRCP if not improving but she had no bile duct dilatation on her CT scan and her common bile duct was 6 mm on ultrasound; she did have multiple shadowing gallstones with the largest measuring 13 mm with no gallbladder wall thickening or pericholecystic fluid -Will have referral to General Surgery and have them evaluate and they were planning for laparoscopic cholecystectomy this admission once her pancreatitis improves given her clinical decompensation and worsening with fevers a repeat CT scan was done and shows pancreatic necrosis so now general surgery recommends having the cholecystectomy done several weeks  Hypothyroidism -She takes 75 mcg of levothyroxine daily so we will resume -Checked TSH and was 1.40 and Free T4 was 0.81  Hypokalemia -Patient's K+ was 3.5 -Replete with po K Phos 500 mg po BID  -Mag level is now 2.2 -Also had initially started her on IV fluid hydration with KCl but will stop given her fluid overload  -Continue to Monitor and Replete as Necessary -Repeat CMP in the AM   Hypophosphatemia -Patient's Phos level is now 2.1 -Replete with po K Phos Neutral 500 mg po BID x2 -Continue to  monitor and replete as necessary -Repeat phosphorus level in the morning  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 -Outpatient follow up with Oncology  Hyponatremia -Mild and Na+ has gone from 140 -> 138 -> 133 x2 and was 130 -> 131 -We will discontinue IV fluid hydration given the amount of hydration that she is gotten -Repeat CMP in the AM  Hyperglycemia -Likely reactive but will evaluate for diabetes mellitus type 2 -Continue monitor CBGs carefully and if necessary will place on sensitive NovoLog sliding scale insulin -Blood sugars ranging from 88-193   Normocytic Anemia -Likely Dilutional Drop -Hgb/Hct went from 14.7/43.5 -> 12.0/34.9 -> 11.6/33.6 -> 11.1/31.2 -> 12.1/34.7 -> 11.7/34.8 -Checked Anemia Panel and showed an iron level of 26, U IBC 189, TIBC of 215, saturation ratios of 12%, ferritin level 429, folate level of 6.9 and vitamin B12 1995 -Continue to Monitor for S/Sx of Bleeding; No overt bleeding note  Diarrhea -Mild. Patient states she has been having frequent bowel movement -Patient asking for a Stool Softener though so will start on Senna-Docusate -Continue to Monitor   Hematuria -Initially U/A Negative -Repeat U/A now given nursing stating that patient having Hematuria and Cloudy Urine; repeat urinalysis showed small hemoglobin with greater than 80 ketones, trace protein, no bacteria seen, 0-5 RBCs per high-power field -We will continue IV fluid hydration as above continue to monitor and if persist may need urological  evaluation outpatient setting  DVT prophylaxis: Enoxaparin 40 mg sq q24h Code Status: FULL CODE  Family Communication: No family currently at bedside Disposition Plan: Pending further clinical improvement  Status is: Inpatient  The patient will require care spanning > 2 midnights and should be moved to inpatient because: She will need to tolerate diet prior to safe discharge disposition  Consultants:  General  Surgery Gastroenterology  Procedures:  RUQ U/S   Antimicrobials:  Anti-infectives (From admission, onward)    Start     Dose/Rate Route Frequency Ordered Stop   07/10/21 1745  piperacillin-tazobactam (ZOSYN) IVPB 3.375 g        3.375 g 12.5 mL/hr over 240 Minutes Intravenous Every 8 hours 07/10/21 1646     07/10/21 0600  cefTRIAXone (ROCEPHIN) 2 g in sodium chloride 0.9 % 100 mL IVPB       Note to Pharmacy: Pharmacy may adjust dosing strength / duration / interval for maximal efficacy   2 g 200 mL/hr over 30 Minutes Intravenous On call to O.R. 07/09/21 1053 07/11/21 0559        Subjective: Seen and examined at bedside and is wearing her CPAP and felt a little bit better today.  States her abdominal pain is improving.  Not as nauseous.  Denies any chest pain or shortness of breath.  No other concerns or complaints at this time but is having some diarrhea started on Thursday but is very mild.  No other concerns or plans at this time.  Objective: Vitals:   07/11/21 2333 07/12/21 0305 07/12/21 0750 07/12/21 1415  BP: (!) 159/76 (!) 170/84 (!) 174/78 (!) 161/78  Pulse: (!) 101 (!) 103 (!) 107 (!) 104  Resp: 16 18 20 18   Temp: 98.8 F (37.1 C) 97.9 F (36.6 C) 99 F (37.2 C) 99.1 F (37.3 C)  TempSrc: Axillary Oral Oral Oral  SpO2: 100% 98% 100% 94%  Weight:      Height:       No intake or output data in the 24 hours ending 07/12/21 1559  Filed Weights   07/07/21 0234  Weight: 61.2 kg   Examination: Physical Exam:  Constitutional: WN/WD African-American female currently no acute distress appears calm and a little bit more comfortable Eyes: Lids and conjunctivae normal, sclerae anicteric  ENMT: External Ears, Nose appear normal. Grossly normal hearing. Mucous membranes are moist.  Neck: Appears normal, supple, no cervical masses, normal ROM, no appreciable thyromegaly; no appreciable JVD Respiratory: Diminished to auscultation bilaterally, no wheezing, rales, rhonchi or  crackles. Normal respiratory effort and patient is not tachypenic. No accessory muscle use.  Unlabored breathing but is wearing her CPAP Cardiovascular: RRR, no murmurs / rubs / gallops. S1 and S2 auscultated.  Mild lower extremity edema A some which bdomen: Soft, non-tender, non-distended. No masses palpated. No appreciable hepatosplenomegaly. Bowel sounds positive.  GU: Deferred. Musculoskeletal: No clubbing / cyanosis of digits/nails.  Normal strength and muscle tone.  Skin: No rashes, lesions, ulcers on limited skin evaluation. No induration; Warm and dry.  Neurologic: CN 2-12 grossly intact with no focal deficits. Romberg sign and cerebellar reflexes not assessed.  Psychiatric: Normal judgment and insight. Alert and oriented x 3. Normal mood and appropriate affect.   Data Reviewed: I have personally reviewed following labs and imaging studies  CBC: Recent Labs  Lab 07/08/21 0521 07/09/21 0503 07/10/21 0530 07/11/21 0604 07/12/21 0535  WBC 11.8* 10.7* 9.1 9.9 10.4  NEUTROABS  --   --   --  7.5 7.8*  HGB 12.0 11.6* 11.1* 12.1 11.7*  HCT 34.9* 33.6* 31.2* 34.7* 34.8*  MCV 88.4 89.1 86.7 86.3 89.2  PLT 213 180 169 226 676    Basic Metabolic Panel: Recent Labs  Lab 07/08/21 0521 07/09/21 0503 07/09/21 1421 07/10/21 0530 07/10/21 0808 07/11/21 0604 07/12/21 0535  NA 138 133*  --  133*  --  130* 131*  K 3.0* 2.9*  --  2.7*  --  2.7* 3.5  CL 103 98  --  99  --  96* 96*  CO2 27 26  --  26  --  23 25  GLUCOSE 109* 101*  --  88  --  96 93  BUN 7 6  --  6  --  <5* 7  CREATININE 0.55 0.55  --  0.47  --  0.46 0.48  CALCIUM 7.5* 7.9*  --  7.7*  --  8.0* 8.2*  MG  --   --  2.0  --  1.9 1.9 2.2  PHOS  --   --  <1.0*  --  1.3* 1.3* 2.1*    GFR: Estimated Creatinine Clearance: 65 mL/min (by C-G formula based on SCr of 0.48 mg/dL). Liver Function Tests: Recent Labs  Lab 07/08/21 0521 07/09/21 0503 07/10/21 0530 07/11/21 0604 07/12/21 0535  AST 44* 37 27 28 23   ALT 95* 75*  55* 51* 39  ALKPHOS 90 85 87 97 95  BILITOT 0.6 1.2 1.1 1.2 1.1  PROT 5.9* 6.0* 5.8* 6.8 6.3*  ALBUMIN 3.3* 3.2* 3.0* 3.3* 2.9*    Recent Labs  Lab 07/07/21 0236 07/08/21 0521 07/09/21 0503 07/10/21 0530 07/12/21 0535  LIPASE 2,627* 307* 54* 33 35    No results for input(s): AMMONIA in the last 168 hours. Coagulation Profile: No results for input(s): INR, PROTIME in the last 168 hours. Cardiac Enzymes: No results for input(s): CKTOTAL, CKMB, CKMBINDEX, TROPONINI in the last 168 hours. BNP (last 3 results) No results for input(s): PROBNP in the last 8760 hours. HbA1C: No results for input(s): HGBA1C in the last 72 hours. CBG: No results for input(s): GLUCAP in the last 168 hours. Lipid Profile: No results for input(s): CHOL, HDL, LDLCALC, TRIG, CHOLHDL, LDLDIRECT in the last 72 hours.  Thyroid Function Tests: No results for input(s): TSH, T4TOTAL, FREET4, T3FREE, THYROIDAB in the last 72 hours. Anemia Panel: Recent Labs    07/11/21 0604  VITAMINB12 1,995*  FOLATE 6.9  FERRITIN 429*  TIBC 215*  IRON 26*  RETICCTPCT 2.4    Sepsis Labs: No results for input(s): PROCALCITON, LATICACIDVEN in the last 168 hours.  Recent Results (from the past 240 hour(s))  Resp Panel by RT-PCR (Flu A&B, Covid) Nasopharyngeal Swab     Status: None   Collection Time: 07/07/21  5:16 AM   Specimen: Nasopharyngeal Swab; Nasopharyngeal(NP) swabs in vial transport medium  Result Value Ref Range Status   SARS Coronavirus 2 by RT PCR NEGATIVE NEGATIVE Final    Comment: (NOTE) SARS-CoV-2 target nucleic acids are NOT DETECTED.  The SARS-CoV-2 RNA is generally detectable in upper respiratory specimens during the acute phase of infection. The lowest concentration of SARS-CoV-2 viral copies this assay can detect is 138 copies/mL. A negative result does not preclude SARS-Cov-2 infection and should not be used as the sole basis for treatment or other patient management decisions. A negative  result may occur with  improper specimen collection/handling, submission of specimen other than nasopharyngeal swab, presence of viral mutation(s) within the areas targeted by this assay, and inadequate  number of viral copies(<138 copies/mL). A negative result must be combined with clinical observations, patient history, and epidemiological information. The expected result is Negative.  Fact Sheet for Patients:  EntrepreneurPulse.com.au  Fact Sheet for Healthcare Providers:  IncredibleEmployment.be  This test is no t yet approved or cleared by the [place] FDA and  has been authorized for detection and/or diagnosis of SARS-CoV-2 by FDA under an Emergency Use Authorization (EUA). This EUA will remain  in effect (meaning this test can be used) for the duration of the COVID-19 declaration under Section 564(b)(1) of the Act, 21 U.S.C.section 360bbb-3(b)(1), unless the authorization is terminated  or revoked sooner.       Influenza A by PCR NEGATIVE NEGATIVE Final   Influenza B by PCR NEGATIVE NEGATIVE Final    Comment: (NOTE) The Xpert Xpress SARS-CoV-2/FLU/RSV plus assay is intended as an aid in the diagnosis of influenza from Nasopharyngeal swab specimens and should not be used as a sole basis for treatment. Nasal washings and aspirates are unacceptable for Xpert Xpress SARS-CoV-2/FLU/RSV testing.  Fact Sheet for Patients: EntrepreneurPulse.com.au  Fact Sheet for Healthcare Providers: IncredibleEmployment.be  This test is not yet approved or cleared by the [place] FDA and has been authorized for detection and/or diagnosis of SARS-CoV-2 by FDA under an Emergency Use Authorization (EUA). This EUA will remain in effect (meaning this test can be used) for the duration of the COVID-19 declaration under Section 564(b)(1) of the Act, 21 U.S.C. section 360bbb-3(b)(1), unless the authorization is  terminated or revoked.  Performed at [facility], [address] [place], [place] [address]   Culture, blood (routine x 2)     Status: None (Preliminary result)   Collection Time: 07/10/21  5:18 PM   Specimen: BLOOD  Result Value Ref Range Status   Specimen Description   Final    BLOOD BLOOD LEFT WRIST Performed at [facility] [address] [place], [place] [address]    Special Requests   Final    BOTTLES DRAWN AEROBIC AND ANAEROBIC Blood Culture adequate volume Performed at [facility] [address], [place], [place] [address]    Culture   Final    NO GROWTH 2 DAYS Performed at [facility] [address], [place], [place] [address]    Report Status PENDING  Incomplete  Culture, blood (routine x 2)     Status: None (Preliminary result)   Collection Time: 07/10/21  5:18 PM   Specimen: BLOOD  Result Value Ref Range Status   Specimen Description   Final    BLOOD BLOOD LEFT FOREARM Performed at [facility] [address] [place], [place] [address]    Special Requests   Final    BOTTLES DRAWN AEROBIC AND ANAEROBIC Blood Culture adequate volume Performed at [facility] [address] [place], [place] [address]    Culture   Final    NO GROWTH 2 DAYS Performed at [facility] [address], [place],  [address]    Report Status PENDING  Incomplete     RN Pressure Injury Documentation:     Estimated body mass index is 23.91 kg/m as calculated from the following:   Height as of this encounter: 5\' 3"  (1.6 m).   Weight as of this encounter: 61.2 kg.  Malnutrition Type:   Malnutrition Characteristics:   Nutrition Interventions:    Radiology Studies: CT ABDOMEN PELVIS W CONTRAST  Result Date: 07/11/2021 CLINICAL  DATA:  Pancreatitis EXAM: CT ABDOMEN AND PELVIS WITH CONTRAST TECHNIQUE: Multidetector CT imaging of the abdomen and pelvis was  performed using the standard protocol following bolus administration of intravenous contrast. CONTRAST:  93mL OMNIPAQUE IOHEXOL 350 MG/ML SOLN COMPARISON:  CT abdomen/pelvis 07/07/2021 FINDINGS: Lower chest: There are new small bilateral pleural effusions with adjacent atelectasis. The imaged heart is unremarkable. Hepatobiliary: The liver is unremarkable. Multiple gas containing gallstones are again seen. There is no evidence of acute cholecystitis. There is no biliary ductal dilatation. Pancreas: The pancreas is edematous with surrounding stranding and fluid consistent with acute pancreatitis. There is new focal hypoenhancement of the pancreatic head raising concern for developing necrosis (2-33). There is no organized peripancreatic collection. Spleen: Unremarkable. Adrenals/Urinary Tract: The adrenals are unremarkable. A subcentimeter hypodense lesion in the left kidney is too small to characterize but likely reflects a small cyst. There are no stones. There is no hydronephrosis or hydroureter. The bladder is unremarkable. Stomach/Bowel: There is a small hiatal hernia. Stomach is otherwise unremarkable. There is mild diffuse thickening of the colonic wall involving the distal transverse, descending, and sigmoid colon which may reflect third-spacing of fluid. There is diverticulosis throughout the left colon. There is no evidence of obstruction. Vascular/Lymphatic: There is scattered calcified atherosclerotic plaque in the nonaneurysmal abdominal aorta. The major branch vessels are patent. The main portal and splenic veins are patent. There is no abdominal or pelvic lymphadenopathy. 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 The adnexa are unremarkable. Other: There is scattered ascites, overall similar in volume to the prior study. There is no free intraperitoneal air. Small locules of gas in the left abdominal wall subcutaneous  fat are likely iatrogenic. Musculoskeletal: There is no acute osseous abnormality or aggressive osseous lesion. IMPRESSION: 1. New focal area of hypoenhancement in the pancreatic head raises concern for developing necrosis in the setting of acute pancreatitis. There is no organized peripancreatic fluid collection. 2. New bilateral pleural effusions with adjacent atelectasis. 3. Thickening of the distal colonic wall may reflect third-spacing of fluid, though nonspecific infectious or inflammatory colitis could have a similar appearance. 4. Scattered ascites, overall similar in volume. 5. Cholelithiasis without evidence of acute cholecystitis. Electronically Signed   By: Valetta Mole M.D.   On: 07/11/2021 11:11    Scheduled Meds:  Chlorhexidine Gluconate Cloth  6 each Topical Daily   enoxaparin (LOVENOX) injection  40 mg Subcutaneous Q24H   feeding supplement  237 mL Oral BID BM   influenza vac split quadrivalent PF  0.5 mL Intramuscular Tomorrow-1000   levothyroxine  75 mcg Oral Q0600   phosphorus  500 mg Oral BID   senna-docusate  1 tablet Oral BID   Continuous Infusions:  sodium chloride 75 mL/hr at 07/12/21 1400   piperacillin-tazobactam (ZOSYN)  IV 3.375 g (07/12/21 1140)    LOS: 4 days   Kerney Elbe, DO [facility] PAGER is on AMION  If 7PM-7AM, please contact night-coverage [email or web address]

## 2021-07-12 NOTE — Progress Notes (Signed)
1 Day Post-Op   Subjective/Chief Complaint: Better this am, still with loose stools, tol some clears   Objective: Vital signs in last 24 hours: Temp:  [97.9 F (36.6 C)-100.7 F (38.2 C)] 99 F (37.2 C) (12/18 0750) Pulse Rate:  [88-111] 107 (12/18 0750) Resp:  [16-22] 20 (12/18 0750) BP: (159-196)/(68-91) 174/78 (12/18 0750) SpO2:  [97 %-100 %] 100 % (12/18 0750) Last BM Date: 07/10/21  Intake/Output from previous day: 12/17 0701 - 12/18 0700 In: 747.8 [I.V.:130.2; IV Piggyback:617.6] Out: -  Intake/Output this shift: No intake/output data recorded.  GI: soft tender epigastrium/luq  Lab Results:  Recent Labs    07/11/21 0604 07/12/21 0535  WBC 9.9 10.4  HGB 12.1 11.7*  HCT 34.7* 34.8*  PLT 226 234   BMET Recent Labs    07/11/21 0604 07/12/21 0535  NA 130* 131*  K 2.7* 3.5  CL 96* 96*  CO2 23 25  GLUCOSE 96 93  BUN <5* 7  CREATININE 0.46 0.48  CALCIUM 8.0* 8.2*   PT/INR No results for input(s): LABPROT, INR in the last 72 hours. ABG No results for input(s): PHART, HCO3 in the last 72 hours.  Invalid input(s): PCO2, PO2  Studies/Results: CT ABDOMEN PELVIS W CONTRAST  Result Date: 07/11/2021 CLINICAL DATA:  Pancreatitis EXAM: CT ABDOMEN AND PELVIS WITH CONTRAST TECHNIQUE: Multidetector CT imaging of the abdomen and pelvis was performed using the standard protocol following bolus administration of intravenous contrast. CONTRAST:  21mL OMNIPAQUE IOHEXOL 350 MG/ML SOLN COMPARISON:  CT abdomen/pelvis 07/07/2021 FINDINGS: Lower chest: There are new small bilateral pleural effusions with adjacent atelectasis. The imaged heart is unremarkable. Hepatobiliary: The liver is unremarkable. Multiple gas containing gallstones are again seen. There is no evidence of acute cholecystitis. There is no biliary ductal dilatation. Pancreas: The pancreas is edematous with surrounding stranding and fluid consistent with acute pancreatitis. There is new focal hypoenhancement of  the pancreatic head raising concern for developing necrosis (2-33). There is no organized peripancreatic collection. Spleen: Unremarkable. Adrenals/Urinary Tract: The adrenals are unremarkable. A subcentimeter hypodense lesion in the left kidney is too small to characterize but likely reflects a small cyst. There are no stones. There is no hydronephrosis or hydroureter. The bladder is unremarkable. Stomach/Bowel: There is a small hiatal hernia. Stomach is otherwise unremarkable. There is mild diffuse thickening of the colonic wall involving the distal transverse, descending, and sigmoid colon which may reflect third-spacing of fluid. There is diverticulosis throughout the left colon. There is no evidence of obstruction. Vascular/Lymphatic: There is scattered calcified atherosclerotic plaque in the nonaneurysmal abdominal aorta. The major branch vessels are patent. The main portal and splenic veins are patent. There is no abdominal or pelvic lymphadenopathy. 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 The adnexa are unremarkable. Other: There is scattered ascites, overall similar in volume to the prior study. There is no free intraperitoneal air. Small locules of gas in the left abdominal wall subcutaneous fat are likely iatrogenic. Musculoskeletal: There is no acute osseous abnormality or aggressive osseous lesion. IMPRESSION: 1. New focal area of hypoenhancement in the pancreatic head raises concern for developing necrosis in the setting of acute pancreatitis. There is no organized peripancreatic fluid collection. 2. New bilateral pleural effusions with adjacent atelectasis. 3. Thickening of the distal colonic wall may reflect third-spacing of fluid, though nonspecific infectious or inflammatory colitis could have a similar appearance. 4. Scattered ascites, overall similar in volume. 5. Cholelithiasis without evidence of acute  cholecystitis. Electronically Signed   By: Valetta Mole M.D.   On: 07/11/2021 11:11    Anti-infectives: Anti-infectives (From admission, onward)    Start     Dose/Rate Route Frequency Ordered Stop   07/10/21 1745  piperacillin-tazobactam (ZOSYN) IVPB 3.375 g        3.375 g 12.5 mL/hr over 240 Minutes Intravenous Every 8 hours 07/10/21 1646     07/10/21 0600  cefTRIAXone (ROCEPHIN) 2 g in sodium chloride 0.9 % 100 mL IVPB       Note to Pharmacy: Pharmacy may adjust dosing strength / duration / interval for maximal efficacy   2 g 200 mL/hr over 30 Minutes Intravenous On call to O.R. 07/09/21 1053 07/11/21 0559       Assessment/Plan: Gallstone pancreatitis  - better today, ct with pancreatitis, possible necrosis -would continue abx -can have fulls with supplements -pancreatitis should resolve, no need for further intervention, will plan to see after discharge and do delayed lap chole at this point  -I discussed this plan at length with her and her husband over phone today ID - zosyn VTE - SCDs, lovenox FEN - fulls, supplements Foley - none   - Per [facility] - Electrolyte abnormalities  HTN Hypothyroidism OSA Depression Hx breast cancer s/p bilateral mastectomies and neoadjuvant chemo  Rolm Bookbinder 07/12/2021

## 2021-07-13 DIAGNOSIS — K8512 Biliary acute pancreatitis with infected necrosis: Secondary | ICD-10-CM

## 2021-07-13 LAB — CBC WITH DIFFERENTIAL/PLATELET
Abs Immature Granulocytes: 0.11 10*3/uL — ABNORMAL HIGH (ref 0.00–0.07)
Basophils Absolute: 0 10*3/uL (ref 0.0–0.1)
Basophils Relative: 0 %
Eosinophils Absolute: 0.1 10*3/uL (ref 0.0–0.5)
Eosinophils Relative: 2 %
HCT: 32.7 % — ABNORMAL LOW (ref 36.0–46.0)
Hemoglobin: 11.5 g/dL — ABNORMAL LOW (ref 12.0–15.0)
Immature Granulocytes: 1 %
Lymphocytes Relative: 18 %
Lymphs Abs: 1.5 10*3/uL (ref 0.7–4.0)
MCH: 30.4 pg (ref 26.0–34.0)
MCHC: 35.2 g/dL (ref 30.0–36.0)
MCV: 86.5 fL (ref 80.0–100.0)
Monocytes Absolute: 0.9 10*3/uL (ref 0.1–1.0)
Monocytes Relative: 11 %
Neutro Abs: 5.5 10*3/uL (ref 1.7–7.7)
Neutrophils Relative %: 68 %
Platelets: 231 10*3/uL (ref 150–400)
RBC: 3.78 MIL/uL — ABNORMAL LOW (ref 3.87–5.11)
RDW: 13.6 % (ref 11.5–15.5)
WBC: 8.2 10*3/uL (ref 4.0–10.5)
nRBC: 0.2 % (ref 0.0–0.2)

## 2021-07-13 LAB — COMPREHENSIVE METABOLIC PANEL
ALT: 35 U/L (ref 0–44)
AST: 24 U/L (ref 15–41)
Albumin: 2.7 g/dL — ABNORMAL LOW (ref 3.5–5.0)
Alkaline Phosphatase: 101 U/L (ref 38–126)
Anion gap: 9 (ref 5–15)
BUN: 5 mg/dL — ABNORMAL LOW (ref 6–20)
CO2: 26 mmol/L (ref 22–32)
Calcium: 8 mg/dL — ABNORMAL LOW (ref 8.9–10.3)
Chloride: 98 mmol/L (ref 98–111)
Creatinine, Ser: 0.57 mg/dL (ref 0.44–1.00)
GFR, Estimated: >60 mL/min (ref >60)
Glucose, Bld: 138 mg/dL — ABNORMAL HIGH (ref 70–99)
Potassium: 2.9 mmol/L — ABNORMAL LOW (ref 3.5–5.1)
Sodium: 133 mmol/L — ABNORMAL LOW (ref 135–145)
Total Bilirubin: 0.6 mg/dL (ref 0.3–1.2)
Total Protein: 6.2 g/dL — ABNORMAL LOW (ref 6.5–8.1)

## 2021-07-13 LAB — PHOSPHORUS: Phosphorus: 2 mg/dL — ABNORMAL LOW (ref 2.5–4.6)

## 2021-07-13 LAB — MAGNESIUM: Magnesium: 2.1 mg/dL (ref 1.7–2.4)

## 2021-07-13 MED ORDER — POTASSIUM CHLORIDE CRYS ER 20 MEQ PO TBCR
40.0000 meq | EXTENDED_RELEASE_TABLET | Freq: Two times a day (BID) | ORAL | Status: DC
  Administered 2021-07-13 – 2021-07-15 (×5): 40 meq via ORAL
  Filled 2021-07-13 (×5): qty 2

## 2021-07-13 MED ORDER — K PHOS MONO-SOD PHOS DI & MONO 155-852-130 MG PO TABS
500.0000 mg | ORAL_TABLET | Freq: Two times a day (BID) | ORAL | Status: AC
  Administered 2021-07-13 (×2): 500 mg via ORAL
  Filled 2021-07-13 (×2): qty 2

## 2021-07-13 NOTE — Plan of Care (Signed)
  Problem: Clinical Measurements: Goal: Respiratory complications will improve Outcome: Progressing Goal: Cardiovascular complication will be avoided Outcome: Progressing   Problem: Coping: Goal: Level of anxiety will decrease Outcome: Progressing   

## 2021-07-13 NOTE — Progress Notes (Signed)
[facility] Progress Note  Brittney Vance 55 y.o. [birth year]   Subjective: Tolerating full liquid diet. Denies abdominal pain/N/V with diet. Feels ok.  Objective: Vital signs: Vitals:   07/12/21 2148 07/13/21 0347  BP:  (!) 157/78  Pulse: 97 95  Resp: 16 20  Temp:  98.6 F (37 C)  SpO2:  97%    Physical Exam: Gen: lethargic, well-nourished, no acute distress, pleasant HEENT: anicteric sclera CV: RRR Chest: CTA B Abd: upper quadrant tenderness with guarding, soft, nondistended, +BS Ext: no edema  Lab Results: Recent Labs    07/12/21 0535 07/13/21 0455  NA 131* 133*  K 3.5 2.9*  CL 96* 98  CO2 25 26  GLUCOSE 93 138*  BUN 7 5*  CREATININE 0.48 0.57  CALCIUM 8.2* 8.0*  MG 2.2 2.1  PHOS 2.1* 2.0*   Recent Labs    07/12/21 0535 07/13/21 0455  AST 23 24  ALT 39 35  ALKPHOS 95 101  BILITOT 1.1 0.6  PROT 6.3* 6.2*  ALBUMIN 2.9* 2.7*   Recent Labs    07/12/21 0535 07/13/21 0455  WBC 10.4 8.2  NEUTROABS 7.8* 5.5  HGB 11.7* 11.5*  HCT 34.8* 32.7*  MCV 89.2 86.5  PLT 234 231      Assessment/Plan: 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 LFTs wnl. Continue supportive care. Full liquid diet and slowly advance as tolerated to low fat diet. Outpt lap chole planned by surgery. Will follow.   [name] 07/13/2021, 2:06 PM  Questions please call [phone] Patient ID: Brittney Vance, female   DOB: April 26, 1966, 55 y.o.   MRN: [id number]

## 2021-07-13 NOTE — Progress Notes (Signed)
Pharmacy Antibiotic Note  Brittney Vance is a 55 y.o. female admitted on 07/07/2021 with nausea and abdominal pain, found to have acute gallstone pancreatitis.  Pharmacy has been consulted for zosyn dosing.  Plan: Continue Zosyn 3.375g IV q8h (4 hour infusion).  Will sign off and follow remotely  Height: 5\' 3"  (160 cm) Weight: 61.2 kg (135 lb) IBW/kg (Calculated) : 52.4  Temp (24hrs), Avg:98.8 F (37.1 C), Min:98.6 F (37 C), Max:99.1 F (37.3 C)  Recent Labs  Lab 07/09/21 0503 07/10/21 0530 07/11/21 0604 07/12/21 0535 07/13/21 0455  WBC 10.7* 9.1 9.9 10.4 8.2  CREATININE 0.55 0.47 0.46 0.48 0.57     Estimated Creatinine Clearance: 65 mL/min (by C-G formula based on SCr of 0.57 mg/dL).    No Known Allergies  Thank you for allowing pharmacy to be a part of this patients care.  Ulice Dash, PharmD  07/13/2021 8:35 AM

## 2021-07-13 NOTE — Progress Notes (Signed)
Brittney Vance   DOB:[birth year]   DJ#:[id number]   HYI#:[id number]  Subjective:  Brittney Vance tells me she had "stomach problems" on and off before presenting to the emergency room 07/07/2021.  She was found to have elevated liver function tests although her total bilirubin was 0.6.  Lipase was markedly elevated 2627.  There was no leukocytosis.  Abdominal CT showed multiple gallstones without acute cholecystitis but evidence of pancreatitis without necrosis.  She was transferred to [facility] and is being treated symptomatically.  Repeat CT of the abdomen and pelvis with contrast 07/11/2021 continues to show evidence of acute pancreatitis with a new focal area of hypoenhancement in the pancreatic head suggesting possible necrosis. [name] tells me that she has less pain than before.  She is mildly nauseated but not vomiting.  She is able to keep clear liquids down or at least some clear liquids.  Recall she is intolerant to dairy products so she has not wanted to try Ensure she says.  She is able to sit up part of the day.  No family in room.  Objective:  Vitals:   07/12/21 2148 07/13/21 0347  BP:  (!) 157/78  Pulse: 97 95  Resp: 16 20  Temp:  98.6 F (37 C)  SpO2:  97%    Body mass index is 23.91 kg/m.  Intake/Output Summary (Last 24 hours) at 07/13/2021 1242 Last data filed at 07/13/2021 0106 Gross per 24 hour  Intake 722.51 ml  Output --  Net 722.51 ml     The abdomen is mildly tender diffusely, with positive bowel sounds  CBG (last 3)  No results for input(s): GLUCAP in the last 72 hours.   Labs:  Lab Results  Component Value Date   WBC 8.2 07/13/2021   HGB 11.5 (L) 07/13/2021   HCT 32.7 (L) 07/13/2021   MCV 86.5 07/13/2021   PLT 231 07/13/2021   NEUTROABS 5.5 07/13/2021    '@LASTCHEMISTRY' @  Urine Studies No results for input(s): UHGB, CRYS in the last 72 hours.  Invalid input(s): UACOL, UAPR, USPG, UPH, UTP, UGL, UKET, UBIL, UNIT, UROB, ULEU, UEPI, UWBC, URBC, UBAC, CAST,  [place], [place]  Basic Metabolic Panel: Recent Labs  Lab 07/09/21 0503 07/09/21 1421 07/10/21 0530 07/10/21 0808 07/11/21 0604 07/12/21 0535 07/13/21 0455  NA 133*  --  133*  --  130* 131* 133*  K 2.9*  --  2.7*  --  2.7* 3.5 2.9*  CL 98  --  99  --  96* 96* 98  CO2 26  --  26  --  '23 25 26  ' GLUCOSE 101*  --  88  --  96 93 138*  BUN 6  --  6  --  <5* 7 5*  CREATININE 0.55  --  0.47  --  0.46 0.48 0.57  CALCIUM 7.9*  --  7.7*  --  8.0* 8.2* 8.0*  MG  --  2.0  --  1.9 1.9 2.2 2.1  PHOS  --  <1.0*  --  1.3* 1.3* 2.1* 2.0*   GFR Estimated Creatinine Clearance: 65 mL/min (by C-G formula based on SCr of 0.57 mg/dL). Liver Function Tests: Recent Labs  Lab 07/09/21 0503 07/10/21 0530 07/11/21 0604 07/12/21 0535 07/13/21 0455  AST 37 '27 28 23 24  ' ALT 75* 55* 51* 39 35  ALKPHOS 85 87 97 95 101  BILITOT 1.2 1.1 1.2 1.1 0.6  PROT 6.0* 5.8* 6.8 6.3* 6.2*  ALBUMIN 3.2* 3.0* 3.3* 2.9* 2.7*   Recent  Labs  Lab 07/07/21 0236 07/08/21 0521 07/09/21 0503 07/10/21 0530 07/12/21 0535  LIPASE 2,627* 307* 54* 33 35   No results for input(s): AMMONIA in the last 168 hours. Coagulation profile No results for input(s): INR, PROTIME in the last 168 hours.  CBC: Recent Labs  Lab 07/09/21 0503 07/10/21 0530 07/11/21 0604 07/12/21 0535 07/13/21 0455  WBC 10.7* 9.1 9.9 10.4 8.2  NEUTROABS  --   --  7.5 7.8* 5.5  HGB 11.6* 11.1* 12.1 11.7* 11.5*  HCT 33.6* 31.2* 34.7* 34.8* 32.7*  MCV 89.1 86.7 86.3 89.2 86.5  PLT 180 169 226 234 231   Cardiac Enzymes: No results for input(s): CKTOTAL, CKMB, CKMBINDEX, TROPONINI in the last 168 hours. BNP: Invalid input(s): POCBNP CBG: No results for input(s): GLUCAP in the last 168 hours. D-Dimer No results for input(s): DDIMER in the last 72 hours. Hgb A1c No results for input(s): HGBA1C in the last 72 hours. Lipid Profile No results for input(s): CHOL, HDL, LDLCALC, TRIG, CHOLHDL, LDLDIRECT in the last 72 hours. Thyroid function  studies No results for input(s): TSH, T4TOTAL, T3FREE, THYROIDAB in the last 72 hours.  Invalid input(s): FREET3 Anemia work up Recent Labs    07/11/21 0604  VITAMINB12 1,995*  FOLATE 6.9  FERRITIN 429*  TIBC 215*  IRON 26*  RETICCTPCT 2.4   Microbiology Recent Results (from the past 240 hour(s))  Resp Panel by RT-PCR (Flu A&B, Covid) Nasopharyngeal Swab     Status: None   Collection Time: 07/07/21  5:16 AM   Specimen: Nasopharyngeal Swab; Nasopharyngeal(NP) swabs in vial transport medium  Result Value Ref Range Status   SARS Coronavirus 2 by RT PCR NEGATIVE NEGATIVE Final    Comment: (NOTE) SARS-CoV-2 target nucleic acids are NOT DETECTED.  The SARS-CoV-2 RNA is generally detectable in upper respiratory specimens during the acute phase of infection. The lowest concentration of SARS-CoV-2 viral copies this assay can detect is 138 copies/mL. A negative result does not preclude SARS-Cov-2 infection and should not be used as the sole basis for treatment or other patient management decisions. A negative result may occur with  improper specimen collection/handling, submission of specimen other than nasopharyngeal swab, presence of viral mutation(s) within the areas targeted by this assay, and inadequate number of viral copies(<138 copies/mL). A negative result must be combined with clinical observations, patient history, and epidemiological information. The expected result is Negative.  Fact Sheet for Patients:  EntrepreneurPulse.com.au  Fact Sheet for Healthcare Providers:  IncredibleEmployment.be  This test is no t yet approved or cleared by the [place] FDA and  has been authorized for detection and/or diagnosis of SARS-CoV-2 by FDA under an Emergency Use Authorization (EUA). This EUA will remain  in effect (meaning this test can be used) for the duration of the COVID-19 declaration under Section 564(b)(1) of the Act,  21 U.S.C.section 360bbb-3(b)(1), unless the authorization is terminated  or revoked sooner.       Influenza A by PCR NEGATIVE NEGATIVE Final   Influenza B by PCR NEGATIVE NEGATIVE Final    Comment: (NOTE) The Xpert Xpress SARS-CoV-2/FLU/RSV plus assay is intended as an aid in the diagnosis of influenza from Nasopharyngeal swab specimens and should not be used as a sole basis for treatment. Nasal washings and aspirates are unacceptable for Xpert Xpress SARS-CoV-2/FLU/RSV testing.  Fact Sheet for Patients: EntrepreneurPulse.com.au  Fact Sheet for Healthcare Providers: IncredibleEmployment.be  This test is not yet approved or cleared by the [place] and has been authorized for  detection and/or diagnosis of SARS-CoV-2 by FDA under an Emergency Use Authorization (EUA). This EUA will remain in effect (meaning this test can be used) for the duration of the COVID-19 declaration under Section 564(b)(1) of the Act, 21 U.S.C. section 360bbb-3(b)(1), unless the authorization is terminated or revoked.  Performed at [facility], [address] [place], [place] [address]   Culture, blood (routine x 2)     Status: None (Preliminary result)   Collection Time: 07/10/21  5:18 PM   Specimen: BLOOD  Result Value Ref Range Status   Specimen Description   Final    BLOOD BLOOD LEFT WRIST Performed at [facility] [address], [place], [place] [address]    Special Requests   Final    BOTTLES DRAWN AEROBIC AND ANAEROBIC Blood Culture adequate volume Performed at [facility] [address], [place], [place] [address]    Culture   Final    NO GROWTH 3 DAYS Performed at [facility], [place] [address], [place], [place] [address]    Report Status PENDING  Incomplete  Culture, blood (routine x 2)     Status: None (Preliminary result)   Collection Time: 07/10/21  5:18 PM   Specimen:  BLOOD  Result Value Ref Range Status   Specimen Description   Final    BLOOD BLOOD LEFT FOREARM Performed at [facility] [address] [place], [place] [address]    Special Requests   Final    BOTTLES DRAWN AEROBIC AND ANAEROBIC Blood Culture adequate volume Performed at [facility] [address] [place], [place] [address]    Culture   Final    NO GROWTH 3 DAYS Performed at [facility], [place] [address], [place], [place] [address]    Report Status PENDING  Incomplete      Studies:  No results found.  Assessment: 55 y.o. BRCA negative [place] woman    (1)  status post left breast upper outer quadrant biopsy 04/06/2013 for a clinical T2 N1, stage IIB invasive ductal carcinoma, high-grade, triple negative, with an MIB-1 of 65%   (2) left axillary lymph node biopsy 04/24/2013 negative for malignancy   (3)  treated in the neoadjuvant setting:              (a) completed 4 dose dense cycles of doxorubicin/ cyclophosphamide 06/11/2013,.             (b)  completed 12 weekly cycles of carboplatin/ paclitaxel 09/24/2013   (4)  status post bilateral mastectomies 10/30/2013, showing benign results on the right and a complete pathologic response in the left, including both sentinel lymph nodes clear   (5) status post bilateral breast implant reconstructive surgery   (6) pedunculated fibroid noted March 2016   (7) Right thyroid nodule noted July 2016  (8) gallstone pancreatitis diagnosed 07/07/2021, with evidence of possible necrotizing pancreatitis 07/11/2021     Plan:  While the scans continue to be of concern, Eddie Dibbles herself is feeling a little better and that is encouraging.  I reviewed the most recent CT scan with her.  She is now 7 and half years out from definitive surgery for her breast cancer.  There has been and is currently no evidence of disease recurrence.  This is particularly favorable in triple negative cases like  hers.  If they are going to recur they almost always recur in the first 2 years.  I explained to her that  when breast cancer recurrence it usually recurs in many places not 1 I cannot recall a single instance of a pancreas metastases except in cases where there was extensive diffuse metastatic disease present, and even then it was rare.  In short I do not see any evidence of breast cancer recurrence  She already has an appointment with [place] in survivorship September 2023.  We would be glad to see her earlier than that if that would be helpful.  Appreciate learning of patient's admission.   Chauncey Cruel, MD 07/13/2021  12:42 PM Medical Oncology and Hematology [facility] [address] [place], [place] [address] Tel. [phone]    Fax. [phone]

## 2021-07-13 NOTE — Progress Notes (Signed)
PROGRESS NOTE    Koralynn Greenspan Amescua  QMG:[id number] DOB: 1966-02-25 DOA: 07/07/2021 PCP: [name], MD  Brief Narrative:  Patient is a 55 year old African-American female with a past medical history significant for but not limited to breast cancer, obstructive sleep apnea, hypothyroidism as well as other comorbidities who presented with nausea vomiting and abdominal pain.  She had acute onset of her symptoms yesterday and her stomach pain was across the umbilicus and left upper quadrant associate with nausea vomiting.  She tried Alka-Seltzer for this but did not do anything for her and eating made it worse.  She is unable to keep anything down and she did not have any fever or diarrhea and because her symptoms did not resolve she went to the ED for further evaluation and CT scan was done and showed acute pancreatitis with no cystic necrosis seen.  He also noted to have cholelithiasis but no acute cholecystitis and no bile ductal dilatation.  She was placed on fluids, pain control antiemetics and admitted for acute pancreatitis.  General surgery was consulted for further evaluation given her cholelithiasis and are planning for laparoscopic gallbladder removal this admission.  Because patient continued to have some pain and nausea and patient was supposed to have surgical intervention yesterday but this was delayed due to emergency.  Upon further review today she was febrile overnight became tachycardic and tachypneic.  General surgery evaluated and because she clinically looks worse they ended up repeating a CT scan of the abdomen pelvis which shows pancreatitis with possible necrosis.  Given that she has been febrile they are recommending starting antibiotics which is done yesterday with Zosyn.  Plans for delayed lap cholecystectomy in the outpatient setting now  Assessment & Plan:   Principal Problem:   Acute pancreatitis  SIRS from Acute Pancreatitis with now pancreatic necrosis WBC is resolved  but she continues to have SIRS criteria and became tachypneic, tachycardic and febrile this morning. -Lipase Level was 2627 -> 307 -> 54 and has now improved and lipase level is 33 -> 35 -Was on a clear liquid diet this is been advanced to full liquid diet now and will advance diet very slowly to a low-fat diet when able -Pain Control with IV Hydromorphone 0.5 mg q3hprn Severe Pain -Will stop Promethazine 12.5 mg q6hprn and Start Ondansetron 4 mg po/IV and 10 mg Compazine if Zofran ineffective -Continue with supportive care and continue to monitor -Given 2 Liters on Admission -She is unclear etiology of her pancreatitis but likely could have been from choledocholithiasis and passed stone.  She denies any alcohol use and lipid panel is negative -General surgery has been consulted as below and will defer to them for MRCP; General Surgery planning on taking the patient to the OR yesterday however it had to be delayed until today.  Given her worsening clinical status overnight and this morning General surgery canceled the surgery and recommended CT scan of the abdomen pelvis with contrast -Repeat CT scan of the abdomen pelvis contrast showed "New focal area of hypoenhancement in the pancreatic head raises concern for developing necrosis in the setting of acute pancreatitis. There is no organized peripancreatic fluid collection. New bilateral pleural effusions with adjacent atelectasis. Thickening of the distal colonic wall may reflect third-spacing of fluid, though nonspecific infectious or inflammatory colitis could have a similar appearance. Scattered ascites, overall similar in volume. Cholelithiasis without evidence of acute cholecystitis." -She was initiated on IV Zosyn yesterday and will continue for now -Given her volume status  and multiple boluses she is + 9.041 L and we has stopped fluid altogether but this was resumed by GI;  -Consulted GI given this new area of area of hypoenhancement with concern  for pancreatic necorsis and GI recommends diet advancement if tolerated later today and they recommended starting the patient back on normal saline at 75 MLS per hour and will continue for now and continue monitor volume status carefully  Abnormal LFTs -In the setting of Pancreatitis  -Patient's AST went from 96 -> 44 -> 37 -> 33 -> 28 -> 23 and is normal at 24 -ALT went from 186 -> 95 -> 75 -> 55 -> 51 -> 39 and is normal at 35 -Acute Hepatitis Panel Negative -CT Abdomen and Pelvis done and showed "Acute edematous pancreatitis without collection. Cholelithiasis." -Continue to monitor and trend LFTs closely and repeat CMP in the AM  Cholelithaiasis  -Seen on CT Scan  -U/S of the Abd/Pelvis done and showed "Cholelithiasis without cholecystitis. Trace free fluid right upper quadrant, consistent with reactive changes given pancreatitis on recent CT." -May need MRCP if not improving but she had no bile duct dilatation on her CT scan and her common bile duct was 6 mm on ultrasound; she did have multiple shadowing gallstones with the largest measuring 13 mm with no gallbladder wall thickening or pericholecystic fluid -Will have referral to General Surgery and have them evaluate and they were planning for laparoscopic cholecystectomy this admission once her pancreatitis improves given her clinical decompensation and worsening with fevers a repeat CT scan was done and shows pancreatic necrosis so now general surgery recommends having the cholecystectomy done several weeks  Hypothyroidism -She takes 75 mcg of levothyroxine daily so we will resume -Checked TSH and was 1.40 and Free T4 was 0.81  Hypokalemia -Patient's K+ was 3.5 and dropped down to 2.9 -Replete with po KCl 40 mEq p.o. twice daily x2 doses as well as p.o. K-Phos neutral 500 mg p.o. 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 [name] Hakim that she is hospitalized.  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2 -> 12.1/34.7 -> 11.7/34.8 -> 11.5/32.7 -Checked Anemia Panel and showed an iron level of 26, U IBC 189, TIBC of 215, saturation ratios of 12%, ferritin level 429, folate level of 6.9 and vitamin B12 1995 -Continue to Monitor for S/Sx of Bleeding; No  overt bleeding note  Diarrhea -Mild. Patient states she has been having frequent bowel movement -Patient asking for a Stool Softener though so will start on Senna-Docusate -Continue to Monitor   Hematuria -Initially U/A Negative -Repeat U/A now given nursing stating that patient having Hematuria and Cloudy Urine; repeat urinalysis showed small hemoglobin with greater than 80 ketones, trace protein, no bacteria seen, 0-5 RBCs per high-power field -We will continue IV fluid hydration as above continue to monitor and if persist may need urological evaluation  outpatient setting  DVT prophylaxis: Enoxaparin 40 mg sq q24h Code Status: FULL CODE  Family Communication: No family currently at bedside Disposition Plan: Pending further clinical improvement  Status is: Inpatient  The patient will require care spanning > 2 midnights and should be moved to inpatient because: She will need to tolerate diet prior to safe discharge disposition  Consultants:  General Surgery Gastroenterology  Procedures:  RUQ U/S   Antimicrobials:  Anti-infectives (From admission, onward)    Start     Dose/Rate Route Frequency Ordered Stop   07/10/21 1745  piperacillin-tazobactam (ZOSYN) IVPB 3.375 g        3.375 g 12.5 mL/hr over 240 Minutes Intravenous Every 8 hours 07/10/21 1646     07/10/21 0600  cefTRIAXone (ROCEPHIN) 2 g in sodium chloride 0.9 % 100 mL IVPB       Note to Pharmacy: Pharmacy may adjust dosing strength / duration / interval for maximal efficacy   2 g 200 mL/hr over 30 Minutes Intravenous On call to O.R. 07/09/21 1053 07/11/21 0559        Subjective: Seen and examined at bedside and thinks she is doing little bit better.  Still a little nauseous and thinks her abdominal pain is improving.  No lightheadedness or dizziness.  Was a little bit anxious.  No other concerns or complaints at this time  Objective: Vitals:   07/12/21 1415 07/12/21 2035 07/12/21 2148 07/13/21 0347  BP: (!) 161/78 136/66  (!) 157/78  Pulse: (!) 104 100 97 95  Resp: 18 20 16 20   Temp: 99.1 F (37.3 C) 98.6 F (37 C)  98.6 F (37 C)  TempSrc: Oral Oral  Axillary  SpO2: 94% 98%  97%  Weight:      Height:        Intake/Output Summary (Last 24 hours) at 07/13/2021 1422 Last data filed at 07/13/2021 0106 Gross per 24 hour  Intake 602.51 ml  Output --  Net 602.51 ml    Filed Weights   07/07/21 0234  Weight: 61.2 kg   Examination: Physical Exam:  Constitutional: WN/WD African-American female currently no acute distress appears a little anxious  Eyes:  Lids and conjunctivae normal, sclerae anicteric  ENMT: External Ears, Nose appear normal. Grossly normal hearing. Mucous membranes are moist. Neck: Appears normal, supple, no cervical masses, normal ROM, no appreciable thyromegaly; no appreciable JVD Respiratory: Diminished to auscultation bilaterally, no wheezing, rales, rhonchi or crackles. Normal respiratory effort and patient is not tachypenic. No accessory muscle use.  Unlabored breathing  Cardiovascular: RRR, no murmurs / rubs / gallops. S1 and S2 auscultated.  Slight lower extremity edema Abdomen: Soft, slightly-tender, non-distended. Bowel sounds positive.  GU: Deferred. Musculoskeletal: No clubbing / cyanosis of digits/nails. No joint deformity upper and lower extremities.  Skin: No rashes, lesions, ulcers on limited skin evaluation. No induration; Warm and dry.  Neurologic: CN 2-12 grossly intact with no focal deficits. Romberg sign and cerebellar reflexes not assessed.  Psychiatric: Normal judgment and  insight. Alert and oriented x 3. Normal mood and appropriate affect.   Data Reviewed: I have personally reviewed following labs and imaging studies  CBC: Recent Labs  Lab 07/09/21 0503 07/10/21 0530 07/11/21 0604 07/12/21 0535 07/13/21 0455  WBC 10.7* 9.1 9.9 10.4 8.2  NEUTROABS  --   --  7.5 7.8* 5.5  HGB 11.6* 11.1* 12.1 11.7* 11.5*  HCT 33.6* 31.2* 34.7* 34.8* 32.7*  MCV 89.1 86.7 86.3 89.2 86.5  PLT 180 169 226 234 170    Basic Metabolic Panel: Recent Labs  Lab 07/09/21 0503 07/09/21 1421 07/10/21 0530 07/10/21 0808 07/11/21 0604 07/12/21 0535 07/13/21 0455  NA 133*  --  133*  --  130* 131* 133*  K 2.9*  --  2.7*  --  2.7* 3.5 2.9*  CL 98  --  99  --  96* 96* 98  CO2 26  --  26  --  23 25 26   GLUCOSE 101*  --  88  --  96 93 138*  BUN 6  --  6  --  <5* 7 5*  CREATININE 0.55  --  0.47  --  0.46 0.48 0.57  CALCIUM 7.9*  --  7.7*  --  8.0* 8.2* 8.0*  MG  --  2.0  --  1.9 1.9 2.2 2.1  PHOS  --  <1.0*  --  1.3*  1.3* 2.1* 2.0*    GFR: Estimated Creatinine Clearance: 65 mL/min (by C-G formula based on SCr of 0.57 mg/dL). Liver Function Tests: Recent Labs  Lab 07/09/21 0503 07/10/21 0530 07/11/21 0604 07/12/21 0535 07/13/21 0455  AST 37 27 28 23 24   ALT 75* 55* 51* 39 35  ALKPHOS 85 87 97 95 101  BILITOT 1.2 1.1 1.2 1.1 0.6  PROT 6.0* 5.8* 6.8 6.3* 6.2*  ALBUMIN 3.2* 3.0* 3.3* 2.9* 2.7*    Recent Labs  Lab 07/07/21 0236 07/08/21 0521 07/09/21 0503 07/10/21 0530 07/12/21 0535  LIPASE 2,627* 307* 54* 33 35    No results for input(s): AMMONIA in the last 168 hours. Coagulation Profile: No results for input(s): INR, PROTIME in the last 168 hours. Cardiac Enzymes: No results for input(s): CKTOTAL, CKMB, CKMBINDEX, TROPONINI in the last 168 hours. BNP (last 3 results) No results for input(s): PROBNP in the last 8760 hours. HbA1C: No results for input(s): HGBA1C in the last 72 hours. CBG: No results for input(s): GLUCAP in the last 168 hours. Lipid Profile: No results for input(s): CHOL, HDL, LDLCALC, TRIG, CHOLHDL, LDLDIRECT in the last 72 hours.  Thyroid Function Tests: No results for input(s): TSH, T4TOTAL, FREET4, T3FREE, THYROIDAB in the last 72 hours. Anemia Panel: Recent Labs    07/11/21 0604  VITAMINB12 1,995*  FOLATE 6.9  FERRITIN 429*  TIBC 215*  IRON 26*  RETICCTPCT 2.4    Sepsis Labs: No results for input(s): PROCALCITON, LATICACIDVEN in the last 168 hours.  Recent Results (from the past 240 hour(s))  Resp Panel by RT-PCR (Flu A&B, Covid) Nasopharyngeal Swab     Status: None   Collection Time: 07/07/21  5:16 AM   Specimen: Nasopharyngeal Swab; Nasopharyngeal(NP) swabs in vial transport medium  Result Value Ref Range Status   SARS Coronavirus 2 by RT PCR NEGATIVE NEGATIVE Final    Comment: (NOTE) SARS-CoV-2 target nucleic acids are NOT DETECTED.  The SARS-CoV-2 RNA is generally detectable in upper respiratory specimens during the acute phase of  infection. The lowest concentration of SARS-CoV-2 viral copies this assay can detect is 138  copies/mL. A negative result does not preclude SARS-Cov-2 infection and should not be used as the sole basis for treatment or other patient management decisions. A negative result may occur with  improper specimen collection/handling, submission of specimen other than nasopharyngeal swab, presence of viral mutation(s) within the areas targeted by this assay, and inadequate number of viral copies(<138 copies/mL). A negative result must be combined with clinical observations, patient history, and epidemiological information. The expected result is Negative.  Fact Sheet for Patients:  EntrepreneurPulse.com.au  Fact Sheet for Healthcare Providers:  IncredibleEmployment.be  This test is no t yet approved or cleared by the [place] FDA and  has been authorized for detection and/or diagnosis of SARS-CoV-2 by FDA under an Emergency Use Authorization (EUA). This EUA will remain  in effect (meaning this test can be used) for the duration of the COVID-19 declaration under Section 564(b)(1) of the Act, 21 U.S.C.section 360bbb-3(b)(1), unless the authorization is terminated  or revoked sooner.       Influenza A by PCR NEGATIVE NEGATIVE Final   Influenza B by PCR NEGATIVE NEGATIVE Final    Comment: (NOTE) The Xpert Xpress SARS-CoV-2/FLU/RSV plus assay is intended as an aid in the diagnosis of influenza from Nasopharyngeal swab specimens and should not be used as a sole basis for treatment. Nasal washings and aspirates are unacceptable for Xpert Xpress SARS-CoV-2/FLU/RSV testing.  Fact Sheet for Patients: EntrepreneurPulse.com.au  Fact Sheet for Healthcare Providers: IncredibleEmployment.be  This test is not yet approved or cleared by the [place] FDA and has been authorized for detection and/or diagnosis of SARS-CoV-2  by FDA under an Emergency Use Authorization (EUA). This EUA will remain in effect (meaning this test can be used) for the duration of the COVID-19 declaration under Section 564(b)(1) of the Act, 21 U.S.C. section 360bbb-3(b)(1), unless the authorization is terminated or revoked.  Performed at [facility], [address] [place], [place] [address]   Culture, blood (routine x 2)     Status: None (Preliminary result)   Collection Time: 07/10/21  5:18 PM   Specimen: BLOOD  Result Value Ref Range Status   Specimen Description   Final    BLOOD BLOOD LEFT WRIST Performed at [facility] [address] [place], [place] [address]    Special Requests   Final    BOTTLES DRAWN AEROBIC AND ANAEROBIC Blood Culture adequate volume Performed at [facility] [address], [place], [place] [address]    Culture   Final    NO GROWTH 3 DAYS Performed at [facility], [place] [address], [place], [place] [address]    Report Status PENDING  Incomplete  Culture, blood (routine x 2)     Status: None (Preliminary result)   Collection Time: 07/10/21  5:18 PM   Specimen: BLOOD  Result Value Ref Range Status   Specimen Description   Final    BLOOD BLOOD LEFT FOREARM Performed at [facility] [address] [place], [place] [address]    Special Requests   Final    BOTTLES DRAWN AEROBIC AND ANAEROBIC Blood Culture adequate volume Performed at [facility] [address], [place], [place] [address]    Culture   Final    NO GROWTH 3 DAYS Performed at [facility], [place] [address] [place],  [address]    Report Status PENDING  Incomplete     RN Pressure Injury Documentation:     Estimated body mass  index is 23.91 kg/m as calculated from the following:   Height as of this encounter: 5\' 3"  (1.6 m).   Weight as of this encounter: 61.2 kg.  Malnutrition Type:   Malnutrition  Characteristics:   Nutrition Interventions:    Radiology Studies: No results found.  Scheduled Meds:  Chlorhexidine Gluconate Cloth  6 each Topical Daily   enoxaparin (LOVENOX) injection  40 mg Subcutaneous Q24H   feeding supplement  237 mL Oral BID BM   influenza vac split quadrivalent PF  0.5 mL Intramuscular Tomorrow-1000   levothyroxine  75 mcg Oral Q0600   phosphorus  500 mg Oral BID   potassium chloride  40 mEq Oral BID   senna-docusate  1 tablet Oral BID   Continuous Infusions:  sodium chloride 75 mL/hr at 07/13/21 0106   piperacillin-tazobactam (ZOSYN)  IV 3.375 g (07/13/21 1055)    LOS: 5 days   Kerney Elbe, DO [facility] PAGER is on [place]  If 7PM-7AM, please contact night-coverage [email or web address]

## 2021-07-13 NOTE — Progress Notes (Signed)
[facility] Progress Note  2 Days Post-Op  Subjective: CC-  Feels the same as yesterday, no better but no worse. Tolerating fulls without increased abdominal pain. Denies n/v. She had about 5 loose stools yesterday. WBC 8.2, afebrile CT yesterday with new focal area of hypoenhancement in the pancreatic head concerning for developing necrosis, no organized peripancreatic fluid collection.  Objective: Vital signs in last 24 hours: Temp:  [98.6 F (37 C)-99.1 F (37.3 C)] 98.6 F (37 C) (12/19 0347) Pulse Rate:  [95-104] 95 (12/19 0347) Resp:  [16-20] 20 (12/19 0347) BP: (136-161)/(66-78) 157/78 (12/19 0347) SpO2:  [94 %-98 %] 97 % (12/19 0347) Last BM Date: 07/12/21  Intake/Output from previous day: 12/18 0701 - 12/19 0700 In: 842.5 [P.O.:240; I.V.:396.9; IV Piggyback:205.6] Out: -  Intake/Output this shift: No intake/output data recorded.  PE: Gen:  Alert, NAD, pleasant Pulm:  rate and effort normal Abd: Soft, mild distension, TTP epigastric region/ LUQ without peritonitis  Lab Results:  Recent Labs    07/12/21 0535 07/13/21 0455  WBC 10.4 8.2  HGB 11.7* 11.5*  HCT 34.8* 32.7*  PLT 234 231   BMET Recent Labs    07/12/21 0535 07/13/21 0455  NA 131* 133*  K 3.5 2.9*  CL 96* 98  CO2 25 26  GLUCOSE 93 138*  BUN 7 5*  CREATININE 0.48 0.57  CALCIUM 8.2* 8.0*   PT/INR No results for input(s): LABPROT, INR in the last 72 hours. CMP     Component Value Date/Time   NA 133 (L) 07/13/2021 0455   NA 140 05/19/2017 1035   K 2.9 (L) 07/13/2021 0455   K 3.5 05/19/2017 1035   CL 98 07/13/2021 0455   CO2 26 07/13/2021 0455   CO2 29 05/19/2017 1035   GLUCOSE 138 (H) 07/13/2021 0455   GLUCOSE 101 05/19/2017 1035   BUN 5 (L) 07/13/2021 0455   BUN 7.0 05/19/2017 1035   CREATININE 0.57 07/13/2021 0455   CREATININE 0.61 03/24/2020 0932   CREATININE 0.7 05/19/2017 1035   CALCIUM 8.0 (L) 07/13/2021 0455   CALCIUM 9.9 05/19/2017 1035   PROT 6.2 (L)  07/13/2021 0455   PROT 7.7 05/19/2017 1035   ALBUMIN 2.7 (L) 07/13/2021 0455   ALBUMIN 4.0 05/19/2017 1035   AST 24 07/13/2021 0455   AST 22 02/20/2019 1307   AST 21 05/19/2017 1035   ALT 35 07/13/2021 0455   ALT 21 02/20/2019 1307   ALT 16 05/19/2017 1035   ALKPHOS 101 07/13/2021 0455   ALKPHOS 91 05/19/2017 1035   BILITOT 0.6 07/13/2021 0455   BILITOT 0.3 02/20/2019 1307   BILITOT 0.39 05/19/2017 1035   GFRNONAA >60 07/13/2021 0455   GFRNONAA >60 02/20/2019 1307   GFRNONAA CANCELED 01/01/2015 0912   GFRAA >60 02/20/2019 1307   GFRAA CANCELED 01/01/2015 0912   Lipase     Component Value Date/Time   LIPASE 35 07/12/2021 0535       Studies/Results: CT ABDOMEN PELVIS W CONTRAST  Result Date: 07/11/2021 CLINICAL DATA:  Pancreatitis EXAM: CT ABDOMEN AND PELVIS WITH CONTRAST TECHNIQUE: Multidetector CT imaging of the abdomen and pelvis was performed using the standard protocol following bolus administration of intravenous contrast. CONTRAST:  33mL OMNIPAQUE IOHEXOL 350 MG/ML SOLN COMPARISON:  CT abdomen/pelvis 07/07/2021 FINDINGS: Lower chest: There are new small bilateral pleural effusions with adjacent atelectasis. The imaged heart is unremarkable. Hepatobiliary: The liver is unremarkable. Multiple gas containing gallstones are again seen. There is no evidence of acute cholecystitis. There is  no biliary ductal dilatation. Pancreas: The pancreas is edematous with surrounding stranding and fluid consistent with acute pancreatitis. There is new focal hypoenhancement of the pancreatic head raising concern for developing necrosis (2-33). There is no organized peripancreatic collection. Spleen: Unremarkable. Adrenals/Urinary Tract: The adrenals are unremarkable. A subcentimeter hypodense lesion in the left kidney is too small to characterize but likely reflects a small cyst. There are no stones. There is no hydronephrosis or hydroureter. The bladder is unremarkable. Stomach/Bowel: There is a  small hiatal hernia. Stomach is otherwise unremarkable. There is mild diffuse thickening of the colonic wall involving the distal transverse, descending, and sigmoid colon which may reflect third-spacing of fluid. There is diverticulosis throughout the left colon. There is no evidence of obstruction. Vascular/Lymphatic: There is scattered calcified atherosclerotic plaque in the nonaneurysmal abdominal aorta. The major branch vessels are patent. The main portal and splenic veins are patent. There is no abdominal or pelvic lymphadenopathy. 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 The adnexa are unremarkable. Other: There is scattered ascites, overall similar in volume to the prior study. There is no free intraperitoneal air. Small locules of gas in the left abdominal wall subcutaneous fat are likely iatrogenic. Musculoskeletal: There is no acute osseous abnormality or aggressive osseous lesion. IMPRESSION: 1. New focal area of hypoenhancement in the pancreatic head raises concern for developing necrosis in the setting of acute pancreatitis. There is no organized peripancreatic fluid collection. 2. New bilateral pleural effusions with adjacent atelectasis. 3. Thickening of the distal colonic wall may reflect third-spacing of fluid, though nonspecific infectious or inflammatory colitis could have a similar appearance. 4. Scattered ascites, overall similar in volume. 5. Cholelithiasis without evidence of acute cholecystitis. Electronically Signed   By: Valetta Mole M.D.   On: 07/11/2021 11:11    Anti-infectives: Anti-infectives (From admission, onward)    Start     Dose/Rate Route Frequency Ordered Stop   07/10/21 1745  piperacillin-tazobactam (ZOSYN) IVPB 3.375 g        3.375 g 12.5 mL/hr over 240 Minutes Intravenous Every 8 hours 07/10/21 1646     07/10/21 0600  cefTRIAXone (ROCEPHIN) 2 g in sodium chloride 0.9 % 100 mL IVPB        Note to Pharmacy: Pharmacy may adjust dosing strength / duration / interval for maximal efficacy   2 g 200 mL/hr over 30 Minutes Intravenous On call to O.R. 07/09/21 1053 07/11/21 0559        Assessment/Plan Gallstone pancreatitis  - Repeat CT scan 12/17 with pancreatitis and possible necrosis. No plans for cholecystectomy this admission. Patient is high risk for developing a pseudocyst. Continue management of pancreatitis per GI/ primary. We will sign off, please call with questions or concerns. Outpatient follow up arranged to discuss delayed lap chole.  ID - zosyn 12/16>> VTE - SCDs, lovenox FEN - FLD, supplements Foley - none   - Per [facility] - Electrolyte abnormalities  HTN Hypothyroidism OSA Depression Hx breast cancer s/p bilateral mastectomies and neoadjuvant chemo   LOS: 5 days    Wellington Hampshire, [facility] 07/13/2021, 9:40 AM Please see Amion for pager number during day hours 7:00am-4:30pm

## 2021-07-14 LAB — CBC WITH DIFFERENTIAL/PLATELET
Abs Immature Granulocytes: 0.11 10*3/uL — ABNORMAL HIGH (ref 0.00–0.07)
Basophils Absolute: 0 10*3/uL (ref 0.0–0.1)
Basophils Relative: 1 %
Eosinophils Absolute: 0.2 10*3/uL (ref 0.0–0.5)
Eosinophils Relative: 3 %
HCT: 34 % — ABNORMAL LOW (ref 36.0–46.0)
Hemoglobin: 11.6 g/dL — ABNORMAL LOW (ref 12.0–15.0)
Immature Granulocytes: 1 %
Lymphocytes Relative: 20 %
Lymphs Abs: 1.7 10*3/uL (ref 0.7–4.0)
MCH: 30.2 pg (ref 26.0–34.0)
MCHC: 34.1 g/dL (ref 30.0–36.0)
MCV: 88.5 fL (ref 80.0–100.0)
Monocytes Absolute: 0.9 10*3/uL (ref 0.1–1.0)
Monocytes Relative: 11 %
Neutro Abs: 5.5 10*3/uL (ref 1.7–7.7)
Neutrophils Relative %: 64 %
Platelets: 272 10*3/uL (ref 150–400)
RBC: 3.84 MIL/uL — ABNORMAL LOW (ref 3.87–5.11)
RDW: 13.8 % (ref 11.5–15.5)
WBC: 8.5 10*3/uL (ref 4.0–10.5)
nRBC: 0 % (ref 0.0–0.2)

## 2021-07-14 LAB — COMPREHENSIVE METABOLIC PANEL
ALT: 36 U/L (ref 0–44)
AST: 28 U/L (ref 15–41)
Albumin: 2.9 g/dL — ABNORMAL LOW (ref 3.5–5.0)
Alkaline Phosphatase: 119 U/L (ref 38–126)
Anion gap: 9 (ref 5–15)
BUN: <5 mg/dL — ABNORMAL LOW (ref 6–20)
CO2: 26 mmol/L (ref 22–32)
Calcium: 8.7 mg/dL — ABNORMAL LOW (ref 8.9–10.3)
Chloride: 100 mmol/L (ref 98–111)
Creatinine, Ser: 0.51 mg/dL (ref 0.44–1.00)
GFR, Estimated: >60 mL/min (ref >60)
Glucose, Bld: 119 mg/dL — ABNORMAL HIGH (ref 70–99)
Potassium: 3.7 mmol/L (ref 3.5–5.1)
Sodium: 135 mmol/L (ref 135–145)
Total Bilirubin: 0.5 mg/dL (ref 0.3–1.2)
Total Protein: 6.5 g/dL (ref 6.5–8.1)

## 2021-07-14 LAB — MAGNESIUM: Magnesium: 1.8 mg/dL (ref 1.7–2.4)

## 2021-07-14 LAB — PHOSPHORUS: Phosphorus: 3 mg/dL (ref 2.5–4.6)

## 2021-07-14 MED ORDER — SODIUM CHLORIDE 0.9% FLUSH
10.0000 mL | INTRAVENOUS | Status: DC | PRN
  Administered 2021-07-18: 06:00:00 10 mL

## 2021-07-14 MED ORDER — MAGNESIUM SULFATE 2 GM/50ML IV SOLN
2.0000 g | Freq: Once | INTRAVENOUS | Status: AC
  Administered 2021-07-14: 11:00:00 2 g via INTRAVENOUS
  Filled 2021-07-14: qty 50

## 2021-07-14 MED ORDER — SODIUM CHLORIDE 0.9% FLUSH
10.0000 mL | Freq: Two times a day (BID) | INTRAVENOUS | Status: DC
  Administered 2021-07-14 – 2021-07-20 (×8): 10 mL

## 2021-07-14 NOTE — Progress Notes (Signed)
[facility] Progress Note  Brittney Vance 55 y.o. [birth year]   Subjective: Ongoing abdominal pain. She is unsure if the pain worsens with liquid diet because she had pain meds around the time of her breakfast. Loose stools this morning. Frustrated with the ongoing abdominal pain.  Objective: Vital signs: Vitals:   07/13/21 2011 07/14/21 0409  BP: (!) 166/83 (!) 167/88  Pulse: 99 92  Resp: 20 18  Temp: 98.2 F (36.8 C) 98 F (36.7 C)  SpO2: 97% 98%    Physical Exam: Gen: alert, no acute distress, pleasant HEENT: anicteric sclera CV: RRR Chest: CTA B Abd: severe tenderness to palpation of upper abdomen, soft, nondistended, +BS Ext: no edema  Lab Results: Recent Labs    07/13/21 0455 07/14/21 0526  NA 133* 135  K 2.9* 3.7  CL 98 100  CO2 26 26  GLUCOSE 138* 119*  BUN 5* <5*  CREATININE 0.57 0.51  CALCIUM 8.0* 8.7*  MG 2.1 1.8  PHOS 2.0* 3.0   Recent Labs    07/13/21 0455 07/14/21 0526  AST 24 28  ALT 35 36  ALKPHOS 101 119  BILITOT 0.6 0.5  PROT 6.2* 6.5  ALBUMIN 2.7* 2.9*   Recent Labs    07/13/21 0455 07/14/21 0526  WBC 8.2 8.5  NEUTROABS 5.5 5.5  HGB 11.5* 11.6*  HCT 32.7* 34.0*  MCV 86.5 88.5  PLT 231 272      Assessment/Plan: Necrotizing pancreatitis with ongoing pain. Do not advance diet as this time and if pain worsens with full liquids then may need to change back to clear liquids or NPO. She wants to stay on full liquids right now. Continue supportive care. Will f/u.   [name] 07/14/2021, 11:40 AM  Questions please call [phone] Patient ID: Brittney Vance, female   DOB: [birth year], 55 y.o.   MRN: [id number]

## 2021-07-14 NOTE — Progress Notes (Signed)
PROGRESS NOTE    Brittney Vance  ZOX:[id number] DOB: 07-11-1966 DOA: 07/07/2021 PCP: [name], MD  Brief Narrative:  Patient is a 55 year old African-American female with a past medical history significant for but not limited to breast cancer, obstructive sleep apnea, hypothyroidism as well as other comorbidities who presented with nausea vomiting and abdominal pain.  She had acute onset of her symptoms yesterday and her stomach pain was across the umbilicus and left upper quadrant associate with nausea vomiting.  She tried Alka-Seltzer for this but did not do anything for her and eating made it worse.  She is unable to keep anything down and she did not have any fever or diarrhea and because her symptoms did not resolve she went to the ED for further evaluation and CT scan was done and showed acute pancreatitis with no cystic necrosis seen.  He also noted to have cholelithiasis but no acute cholecystitis and no bile ductal dilatation.  She was placed on fluids, pain control antiemetics and admitted for acute pancreatitis.  General surgery was consulted for further evaluation given her cholelithiasis and are planning for laparoscopic gallbladder removal this admission.  Because patient continued to have some pain and nausea and patient was supposed to have surgical intervention yesterday but this was delayed due to emergency.  Upon further review today she was febrile overnight became tachycardic and tachypneic.  General surgery evaluated and because she clinically looks worse they ended up repeating a CT scan of the abdomen pelvis which shows pancreatitis with possible necrosis.  Given that she has been febrile they are recommending starting antibiotics which is done yesterday with Zosyn.  Plans for delayed lap cholecystectomy in the outpatient setting now  Patient still having quite a bit of abdominal pain and is unsure whether it worsens with liquid diet.  She has had pain meds but she is having  loose stools and is frustrated with her ongoing abdominal pain.  GI recommends not advancing diet at this time if pain worsens with full liquid diet may need to change back to clears or NPO.  They are recommending continue supportive care for now  Assessment & Plan:   Principal Problem:   Acute pancreatitis  SIRS from Acute Pancreatitis with now pancreatic necrosis WBC is resolved but she continues to have SIRS criteria and became tachypneic, tachycardic and febrile this morning. -Lipase Level was 2627 -> 307 -> 54 and has now improved and lipase level is 33 -> 35 -Was on a clear liquid diet this is been advanced to full liquid diet now and will advance diet very slowly to a low-fat diet when able but she is still having a lot of abdominal pain so GI recommends continuing full liquid diet but may need to change back to clears her n.p.o. given the amount of pain she is having -Pain Control with IV Hydromorphone 0.5 mg q3hprn Severe Pain -Will stop Promethazine 12.5 mg q6hprn and Start Ondansetron 4 mg po/IV and 10 mg Compazine if Zofran ineffective -Continue with supportive care and continue to monitor -Given 2 Liters on Admission -She is unclear etiology of her pancreatitis but likely could have been from choledocholithiasis and passed stone.  She denies any alcohol use and lipid panel is negative -General surgery has been consulted as below and will defer to them for MRCP; General Surgery planning on taking the patient to the OR yesterday however it had to be delayed until today.  Given her worsening clinical status overnight and this morning  General surgery canceled the surgery and recommended CT scan of the abdomen pelvis with contrast -Repeat CT scan of the abdomen pelvis contrast showed "New focal area of hypoenhancement in the pancreatic head raises concern for developing necrosis in the setting of acute pancreatitis. There is no organized peripancreatic fluid collection. New bilateral pleural  effusions with adjacent atelectasis. Thickening of the distal colonic wall may reflect third-spacing of fluid, though nonspecific infectious or inflammatory colitis could have a similar appearance. Scattered ascites, overall similar in volume. Cholelithiasis without evidence of acute cholecystitis." -She was initiated on IV Zosyn yesterday and will continue for now -Given her volume status and multiple boluses she is + 9.517 L and we has stopped fluid altogether but this was resumed by GI;  -Consulted GI given this new area of area of hypoenhancement with concern for pancreatic necorsis and GI recommends diet advancement if tolerated but currently she is unable to tolerate advancement and they recommended starting the patient back on normal saline at 75 MLS per hour and will continue for now and continue monitor volume status carefully  Abnormal LFTs -In the setting of Pancreatitis  -AST and ALT have now normalized with the last AST being 28 and ALT being 36 -Acute Hepatitis Panel Negative -CT Abdomen and Pelvis done and showed "Acute edematous pancreatitis without collection. Cholelithiasis." -Continue to monitor and trend LFTs closely and repeat CMP in the AM  Cholelithaiasis  -Seen on CT Scan  -U/S of the Abd/Pelvis done and showed "Cholelithiasis without cholecystitis. Trace free fluid right upper quadrant, consistent with reactive changes given pancreatitis on recent CT." -May need MRCP if not improving but she had no bile duct dilatation on her CT scan and her common bile duct was 6 mm on ultrasound; she did have multiple shadowing gallstones with the largest measuring 13 mm with no gallbladder wall thickening or pericholecystic fluid -Will have referral to General Surgery and have them evaluate and they were planning for laparoscopic cholecystectomy this admission once her pancreatitis improves given her clinical decompensation and worsening with fevers a repeat CT scan was done and shows  pancreatic necrosis so now general surgery recommends having the cholecystectomy done several weeks  Hypothyroidism -She takes 75 mcg of levothyroxine daily so we will resume -Checked TSH and was 1.40 and Free T4 was 0.81  Hypokalemia -Patient's K+ is now 3.7 -Mag level is now 2.1 -IV fluid hydration as above -Continue to Monitor and Replete as Necessary -Repeat CMP in the AM   Hypophosphatemia -Patient's Phos level is now 3.0 -Continue to monitor and replete as necessary -Repeat phosphorus level in the morning  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 [name] Hakim that she is hospitalized.  Patient had concerns that she had pancreatic cancer now but I assured her that it was likely necrotizing pancreatitis but she still wanted to speak with her oncologist Dr. [name] Hakim came by yesterday and evaluated  Hyponatremia -Mild and now Na+ is now 135 and up from 130 -IV fluid hydration had been removed yesterday by GI we will continue -Repeat CMP in the AM  Hyperglycemia -Likely reactive but will evaluate for diabetes mellitus type 2 -Continue monitor CBGs carefully and if necessary will place on sensitive NovoLog sliding scale insulin -Blood sugars ranging from 88-193   Normocytic Anemia -Likely Dilutional Drop -Hgb/Hct went from 14.7/43.5 -> 12.0/34.9 -> 11.6/33.6 -> 11.1/31.2 -> 12.1/34.7 -> 11.7/34.8 -> 11.5/32.7 and is now 11.6/34.0 and stable -Checked Anemia Panel  and showed an iron level of 26, U IBC 189, TIBC of 215, saturation ratios of 12%, ferritin level 429, folate level of 6.9 and vitamin B12 1995 -Continue to Monitor for S/Sx of Bleeding; No overt bleeding note  Diarrhea -Mild. Patient states she has been having frequent bowel movement -Patient asking for a Stool Softener though so will start on Senna-Docusate -Continue to Monitor as some loose stools and will continue to monitor and if necessary we will  stop her senna docusate  Hematuria -Initially U/A Negative -Repeat U/A now given nursing stating that patient having Hematuria and Cloudy Urine; repeat urinalysis showed small hemoglobin with greater than 80 ketones, trace protein, no bacteria seen, 0-5 RBCs per high-power field -We will continue IV fluid hydration as above continue to monitor and if persist may need urological evaluation outpatient setting  DVT prophylaxis: Enoxaparin 40 mg sq q24h Code Status: FULL CODE  Family Communication: No family currently at bedside Disposition Plan: Pending further clinical improvement  Status is: Inpatient  The patient will require care spanning > 2 midnights and should be moved to inpatient because: She will need to tolerate diet prior to safe discharge disposition  Consultants:  General Surgery Gastroenterology  Procedures:  RUQ U/S   Antimicrobials:  Anti-infectives (From admission, onward)    Start     Dose/Rate Route Frequency Ordered Stop   07/10/21 1745  piperacillin-tazobactam (ZOSYN) IVPB 3.375 g        3.375 g 12.5 mL/hr over 240 Minutes Intravenous Every 8 hours 07/10/21 1646     07/10/21 0600  cefTRIAXone (ROCEPHIN) 2 g in sodium chloride 0.9 % 100 mL IVPB       Note to Pharmacy: Pharmacy may adjust dosing strength / duration / interval for maximal efficacy   2 g 200 mL/hr over 30 Minutes Intravenous On call to O.R. 07/09/21 1053 07/11/21 0559        Subjective: Seen and examined at bedside and was having a lot of abdominal pain today and was nauseous.  States that she is frustrated that she continues to have a lot of pain.  No vomiting.  Still having some loose stools.  No other concerns or plans at this time.  Unfortunately she lost IV access so will need a midline given her history of breast cancer.  No other concerns or points this time.  Objective: Vitals:   07/13/21 2011 07/14/21 0409 07/14/21 1100 07/14/21 1343  BP: (!) 166/83 (!) 167/88  (!) 171/85  Pulse:  99 92  93  Resp: 20 18  18   Temp: 98.2 F (36.8 C) 98 F (36.7 C) 99 F (37.2 C) 98.6 F (37 C)  TempSrc: Axillary Axillary Oral Oral  SpO2: 97% 98%  99%  Weight:      Height:        Intake/Output Summary (Last 24 hours) at 07/14/2021 1518 Last data filed at 07/14/2021 1300 Gross per 24 hour  Intake 476 ml  Output --  Net 476 ml    Filed Weights   07/07/21 0234  Weight: 61.2 kg   Examination: Physical Exam:  Constitutional: WN/WD African-American female currently in no acute distress appears well anxious and frustrated Eyes: Lids and conjunctivae normal, sclerae anicteric  ENMT: External Ears, Nose appear normal. Grossly normal hearing. Mucous membranes are moist. Neck: Appears normal, supple, no cervical masses, normal ROM, no appreciable thyromegaly; no appreciable JVD Respiratory: Diminished to auscultation bilaterally since, no wheezing, rales, rhonchi or crackles. Normal respiratory effort and patient is  not tachypenic. No accessory muscle use.  Cardiovascular: RRR, no murmurs / rubs / gallops. S1 and S2 auscultated.  Mild lower extremity edema Abdomen: Soft, slightly-tender, non-distended. Bowel sounds positive.  GU: Deferred. Musculoskeletal: No clubbing / cyanosis of digits/nails. No joint deformity upper and lower extremities.  Skin: No rashes, lesions, ulcers on limited skin evaluation. No induration; Warm and dry.  Neurologic: CN 2-12 grossly intact with no focal deficits. Romberg sign cerebellar reflexes not assessed.  Psychiatric: Normal judgment and insight. Alert and oriented x 3. Slightly Anxious mood and appropriate affect.   Data Reviewed: I have personally reviewed following labs and imaging studies  CBC: Recent Labs  Lab 07/10/21 0530 07/11/21 0604 07/12/21 0535 07/13/21 0455 07/14/21 0526  WBC 9.1 9.9 10.4 8.2 8.5  NEUTROABS  --  7.5 7.8* 5.5 5.5  HGB 11.1* 12.1 11.7* 11.5* 11.6*  HCT 31.2* 34.7* 34.8* 32.7* 34.0*  MCV 86.7 86.3 89.2 86.5  88.5  PLT 169 226 234 231 580   Basic Metabolic Panel: Recent Labs  Lab 07/10/21 0530 07/10/21 0808 07/11/21 0604 07/12/21 0535 07/13/21 0455 07/14/21 0526  NA 133*  --  130* 131* 133* 135  K 2.7*  --  2.7* 3.5 2.9* 3.7  CL 99  --  96* 96* 98 100  CO2 26  --  23 25 26 26   GLUCOSE 88  --  96 93 138* 119*  BUN 6  --  <5* 7 5* <5*  CREATININE 0.47  --  0.46 0.48 0.57 0.51  CALCIUM 7.7*  --  8.0* 8.2* 8.0* 8.7*  MG  --  1.9 1.9 2.2 2.1 1.8  PHOS  --  1.3* 1.3* 2.1* 2.0* 3.0   GFR: Estimated Creatinine Clearance: 65 mL/min (by C-G formula based on SCr of 0.51 mg/dL). Liver Function Tests: Recent Labs  Lab 07/10/21 0530 07/11/21 0604 07/12/21 0535 07/13/21 0455 07/14/21 0526  AST 27 28 23 24 28   ALT 55* 51* 39 35 36  ALKPHOS 87 97 95 101 119  BILITOT 1.1 1.2 1.1 0.6 0.5  PROT 5.8* 6.8 6.3* 6.2* 6.5  ALBUMIN 3.0* 3.3* 2.9* 2.7* 2.9*    Recent Labs  Lab 07/08/21 0521 07/09/21 0503 07/10/21 0530 07/12/21 0535  LIPASE 307* 54* 33 35    No results for input(s): AMMONIA in the last 168 hours. Coagulation Profile: No results for input(s): INR, PROTIME in the last 168 hours. Cardiac Enzymes: No results for input(s): CKTOTAL, CKMB, CKMBINDEX, TROPONINI in the last 168 hours. BNP (last 3 results) No results for input(s): PROBNP in the last 8760 hours. HbA1C: No results for input(s): HGBA1C in the last 72 hours. CBG: No results for input(s): GLUCAP in the last 168 hours. Lipid Profile: No results for input(s): CHOL, HDL, LDLCALC, TRIG, CHOLHDL, LDLDIRECT in the last 72 hours.  Thyroid Function Tests: No results for input(s): TSH, T4TOTAL, FREET4, T3FREE, THYROIDAB in the last 72 hours. Anemia Panel: No results for input(s): VITAMINB12, FOLATE, FERRITIN, TIBC, IRON, RETICCTPCT in the last 72 hours.  Sepsis Labs: No results for input(s): PROCALCITON, LATICACIDVEN in the last 168 hours.  Recent Results (from the past 240 hour(s))  Resp Panel by RT-PCR (Flu A&B,  Covid) Nasopharyngeal Swab     Status: None   Collection Time: 07/07/21  5:16 AM   Specimen: Nasopharyngeal Swab; Nasopharyngeal(NP) swabs in vial transport medium  Result Value Ref Range Status   SARS Coronavirus 2 by RT PCR NEGATIVE NEGATIVE Final    Comment: (NOTE) SARS-CoV-2 target nucleic  acids are NOT DETECTED.  The SARS-CoV-2 RNA is generally detectable in upper respiratory specimens during the acute phase of infection. The lowest concentration of SARS-CoV-2 viral copies this assay can detect is 138 copies/mL. A negative result does not preclude SARS-Cov-2 infection and should not be used as the sole basis for treatment or other patient management decisions. A negative result may occur with  improper specimen collection/handling, submission of specimen other than nasopharyngeal swab, presence of viral mutation(s) within the areas targeted by this assay, and inadequate number of viral copies(<138 copies/mL). A negative result must be combined with clinical observations, patient history, and epidemiological information. The expected result is Negative.  Fact Sheet for Patients:  EntrepreneurPulse.com.au  Fact Sheet for Healthcare Providers:  IncredibleEmployment.be  This test is no t yet approved or cleared by the [place] FDA and  has been authorized for detection and/or diagnosis of SARS-CoV-2 by FDA under an Emergency Use Authorization (EUA). This EUA will remain  in effect (meaning this test can be used) for the duration of the COVID-19 declaration under Section 564(b)(1) of the Act, 21 U.S.C.section 360bbb-3(b)(1), unless the authorization is terminated  or revoked sooner.       Influenza A by PCR NEGATIVE NEGATIVE Final   Influenza B by PCR NEGATIVE NEGATIVE Final    Comment: (NOTE) The Xpert Xpress SARS-CoV-2/FLU/RSV plus assay is intended as an aid in the diagnosis of influenza from Nasopharyngeal swab specimens and should  not be used as a sole basis for treatment. Nasal washings and aspirates are unacceptable for Xpert Xpress SARS-CoV-2/FLU/RSV testing.  Fact Sheet for Patients: EntrepreneurPulse.com.au  Fact Sheet for Healthcare Providers: IncredibleEmployment.be  This test is not yet approved or cleared by the [place] FDA and has been authorized for detection and/or diagnosis of SARS-CoV-2 by FDA under an Emergency Use Authorization (EUA). This EUA will remain in effect (meaning this test can be used) for the duration of the COVID-19 declaration under Section 564(b)(1) of the Act, 21 U.S.C. section 360bbb-3(b)(1), unless the authorization is terminated or revoked.  Performed at [facility], [address] [place], [place] [address]   Culture, blood (routine x 2)     Status: None (Preliminary result)   Collection Time: 07/10/21  5:18 PM   Specimen: BLOOD  Result Value Ref Range Status   Specimen Description   Final    BLOOD BLOOD LEFT WRIST Performed at [facility] [address], [place], [place] [address]    Special Requests   Final    BOTTLES DRAWN AEROBIC AND ANAEROBIC Blood Culture adequate volume Performed at [facility] [address], [place], [place] [address]    Culture   Final    NO GROWTH 4 DAYS Performed at [facility], [place] [address] [place], [place] [address]    Report Status PENDING  Incomplete  Culture, blood (routine x 2)     Status: None (Preliminary result)   Collection Time: 07/10/21  5:18 PM   Specimen: BLOOD  Result Value Ref Range Status   Specimen Description   Final    BLOOD BLOOD LEFT FOREARM Performed at [facility] [address], [place], [place] [address]    Special Requests   Final    BOTTLES DRAWN AEROBIC AND ANAEROBIC Blood Culture adequate volume Performed at [facility] [address]  [place], [place] [address]    Culture   Final    NO GROWTH 4 DAYS Performed  at [facility], [place] [address], [place], [place] [address]    Report Status PENDING  Incomplete     RN Pressure Injury Documentation:     Estimated body mass index is 23.91 kg/m as calculated from the following:   Height as of this encounter: 5\' 3"  (1.6 m).   Weight as of this encounter: 61.2 kg.  Malnutrition Type:   Malnutrition Characteristics:   Nutrition Interventions:    Radiology Studies: No results found.  Scheduled Meds:  Chlorhexidine Gluconate Cloth  6 each Topical Daily   enoxaparin (LOVENOX) injection  40 mg Subcutaneous Q24H   feeding supplement  237 mL Oral BID BM   influenza vac split quadrivalent PF  0.5 mL Intramuscular Tomorrow-1000   levothyroxine  75 mcg Oral Q0600   potassium chloride  40 mEq Oral BID   senna-docusate  1 tablet Oral BID   Continuous Infusions:  sodium chloride 75 mL/hr at 07/13/21 0106   piperacillin-tazobactam (ZOSYN)  IV 3.375 g (07/14/21 0410)    LOS: 6 days   Kerney Elbe, DO [facility] PAGER is on AMION  If 7PM-7AM, please contact night-coverage [email or web address]

## 2021-07-15 DIAGNOSIS — K8020 Calculus of gallbladder without cholecystitis without obstruction: Secondary | ICD-10-CM

## 2021-07-15 DIAGNOSIS — K8591 Acute pancreatitis with uninfected necrosis, unspecified: Secondary | ICD-10-CM

## 2021-07-15 LAB — COMPREHENSIVE METABOLIC PANEL
ALT: 36 U/L (ref 0–44)
AST: 32 U/L (ref 15–41)
Albumin: 2.7 g/dL — ABNORMAL LOW (ref 3.5–5.0)
Alkaline Phosphatase: 118 U/L (ref 38–126)
Anion gap: 7 (ref 5–15)
BUN: <5 mg/dL — ABNORMAL LOW (ref 6–20)
CO2: 25 mmol/L (ref 22–32)
Calcium: 8.5 mg/dL — ABNORMAL LOW (ref 8.9–10.3)
Chloride: 99 mmol/L (ref 98–111)
Creatinine, Ser: 0.64 mg/dL (ref 0.44–1.00)
GFR, Estimated: >60 mL/min (ref >60)
Glucose, Bld: 140 mg/dL — ABNORMAL HIGH (ref 70–99)
Potassium: 4.1 mmol/L (ref 3.5–5.1)
Sodium: 131 mmol/L — ABNORMAL LOW (ref 135–145)
Total Bilirubin: 0.6 mg/dL (ref 0.3–1.2)
Total Protein: 6.2 g/dL — ABNORMAL LOW (ref 6.5–8.1)

## 2021-07-15 LAB — CBC WITH DIFFERENTIAL/PLATELET
Abs Immature Granulocytes: 0.13 10*3/uL — ABNORMAL HIGH (ref 0.00–0.07)
Basophils Absolute: 0 10*3/uL (ref 0.0–0.1)
Basophils Relative: 0 %
Eosinophils Absolute: 0.2 10*3/uL (ref 0.0–0.5)
Eosinophils Relative: 3 %
HCT: 31.3 % — ABNORMAL LOW (ref 36.0–46.0)
Hemoglobin: 10.3 g/dL — ABNORMAL LOW (ref 12.0–15.0)
Immature Granulocytes: 2 %
Lymphocytes Relative: 18 %
Lymphs Abs: 1.4 10*3/uL (ref 0.7–4.0)
MCH: 29.6 pg (ref 26.0–34.0)
MCHC: 32.9 g/dL (ref 30.0–36.0)
MCV: 89.9 fL (ref 80.0–100.0)
Monocytes Absolute: 0.8 10*3/uL (ref 0.1–1.0)
Monocytes Relative: 10 %
Neutro Abs: 5.6 10*3/uL (ref 1.7–7.7)
Neutrophils Relative %: 67 %
Platelets: 282 10*3/uL (ref 150–400)
RBC: 3.48 MIL/uL — ABNORMAL LOW (ref 3.87–5.11)
RDW: 13.7 % (ref 11.5–15.5)
WBC: 8.2 10*3/uL (ref 4.0–10.5)
nRBC: 0.2 % (ref 0.0–0.2)

## 2021-07-15 LAB — CULTURE, BLOOD (ROUTINE X 2)
Culture: NO GROWTH
Culture: NO GROWTH
Special Requests: ADEQUATE
Special Requests: ADEQUATE

## 2021-07-15 LAB — MAGNESIUM: Magnesium: 2 mg/dL (ref 1.7–2.4)

## 2021-07-15 LAB — PHOSPHORUS: Phosphorus: 3.1 mg/dL (ref 2.5–4.6)

## 2021-07-15 NOTE — Assessment & Plan Note (Addendum)
Will need outpatient lap chole - Gen surg going to arrange

## 2021-07-15 NOTE — Progress Notes (Signed)
[facility] Progress Note  Patient: Brittney Vance     WIO:[id number]  DOA: 07/07/2021      Date of Service: the patient was seen and examined on 07/15/2021  Brief hospital course: Patient is a 55 year old African-American female with a past medical history significant for but not limited to breast cancer, obstructive sleep apnea, hypothyroidism as well as other comorbidities who presented with nausea vomiting and abdominal pain.  Patient was initially treated for idiopathic acute pancreatitis.  Noted to have cholelithiasis but no cholecystitis.  Patient continued to have severe pain nausea and then became febrile with tachycardia and tachypnea.  He CT scan of the abdomen pelvis on 12/17 revealed ongoing pancreatitis with possible necrosis.  She was started on Zosyn on 07/10/2021.  Subjective: she has ongoing abdominal pain and nausea. Worse when trying full liquids so only drinking clear liquids.   Assessment and Plan: Acute necrotizing pancreatitis - Currently tolerating only clear liquids-she states that she tried potato soup and that made her feel bad and now she is sticking to only broth - Continue Zosyn  Hypothyroidism - Continue Synthroid  Cholelithiasis - Incidental finding on CT scan  Cancer of upper-outer quadrant of female breast ([facility]) - In remission    Body mass index is 23.91 kg/m.        Time spent in minutes: 35 DVT prophylaxis:  Code Status: Full code Level of Care: Level of care: Telemetry Disposition Plan:  Status is: Inpatient  Remains inpatient appropriate because: due to severe acute pancreatitis    Objective:   Vitals:   07/14/21 1100 07/14/21 1343 07/14/21 2011 07/15/21 0342  BP:  (!) 171/85 (!) 148/77 139/87  Pulse:  93 84 99  Resp:  18 20 20   Temp: 99 F (37.2 C) 98.6 F (37 C) 98.3 F (36.8 C) 99.2 F (37.3 C)  TempSrc: Oral Oral Axillary Oral  SpO2:  99% 98% 99%  Weight:      Height:       Filed Weights   07/07/21 0234   Weight: 61.2 kg    Exam: General exam: Appears comfortable  HEENT: PERRLA, oral mucosa moist, no sclera icterus or thrush Respiratory system: Clear to auscultation. Respiratory effort normal. Cardiovascular system: S1 & S2 heard, regular rate and rhythm Gastrointestinal system: Abdomen soft,  tender, nondistended. Normal bowel sounds   Central nervous system: Alert and oriented. No focal neurological deficits. Extremities: No cyanosis, clubbing or edema Skin: No rashes or ulcers Psychiatry:  Mood & affect appropriate.     Imaging and lab data Reviewed    CBC: Recent Labs  Lab 07/11/21 0604 07/12/21 0535 07/13/21 0455 07/14/21 0526 07/15/21 0315  WBC 9.9 10.4 8.2 8.5 8.2  NEUTROABS 7.5 7.8* 5.5 5.5 5.6  HGB 12.1 11.7* 11.5* 11.6* 10.3*  HCT 34.7* 34.8* 32.7* 34.0* 31.3*  MCV 86.3 89.2 86.5 88.5 89.9  PLT 226 234 231 272 426   Basic Metabolic Panel: Recent Labs  Lab 07/11/21 0604 07/12/21 0535 07/13/21 0455 07/14/21 0526 07/15/21 0315  NA 130* 131* 133* 135 131*  K 2.7* 3.5 2.9* 3.7 4.1  CL 96* 96* 98 100 99  CO2 23 25 26 26 25   GLUCOSE 96 93 138* 119* 140*  BUN <5* 7 5* <5* <5*  CREATININE 0.46 0.48 0.57 0.51 0.64  CALCIUM 8.0* 8.2* 8.0* 8.7* 8.5*  MG 1.9 2.2 2.1 1.8 2.0  PHOS 1.3* 2.1* 2.0* 3.0 3.1   GFR: Estimated Creatinine Clearance: 65 mL/min (by C-G formula based  on SCr of 0.64 mg/dL).  Time spent: 35 minutes.   Author: Debbe Odea  07/15/2021 5:19 PM  To reach On-call, see care teams to locate the attending and reach out via [email or web address]. Between 7PM-7AM, please contact night-coverage If you still have difficulty reaching the attending provider, please page the [facility] (Director on Call) for [facility] on amion for assistance.

## 2021-07-15 NOTE — Progress Notes (Signed)
[facility]enterology Progress Note  Tommy Minichiello Holan 55 y.o. [birth year]   Subjective: Ongoing abdominal pain; Tolerating full liquids. Denies N/V.  Objective: Vital signs: Vitals:   07/14/21 2011 07/15/21 0342  BP: (!) 148/77 139/87  Pulse: 84 99  Resp: 20 20  Temp: 98.3 F (36.8 C) 99.2 F (37.3 C)  SpO2: 98% 99%    Physical Exam: Gen: lethargic, no acute distress  HEENT: anicteric sclera CV: RRR Chest: CTA B Abd: diffuse tenderness (greatest in upper quadrant) with guarding, soft, mild distention, +BS Ext: no edema  Lab Results: Recent Labs    07/14/21 0526 07/15/21 0315  NA 135 131*  K 3.7 4.1  CL 100 99  CO2 26 25  GLUCOSE 119* 140*  BUN <5* <5*  CREATININE 0.51 0.64  CALCIUM 8.7* 8.5*  MG 1.8 2.0  PHOS 3.0 3.1   Recent Labs    07/14/21 0526 07/15/21 0315  AST 28 32  ALT 36 36  ALKPHOS 119 118  BILITOT 0.5 0.6  PROT 6.5 6.2*  ALBUMIN 2.9* 2.7*   Recent Labs    07/14/21 0526 07/15/21 0315  WBC 8.5 8.2  NEUTROABS 5.5 5.6  HGB 11.6* 10.3*  HCT 34.0* 31.3*  MCV 88.5 89.9  PLT 272 282      Assessment/Plan: Necrotizing pancreatitis slow to improve. Do not advance diet. Tolerating full liquids. Continue supportive care. Repeat CT on 12/23 if pain does not start to improve by then and if it does will repeat next week. Will follow.   [name] 07/15/2021, 10:47 AM  Questions please call [phone] Patient ID: Newell Coral [place], female   DOB: [birth year], 55 y.o.   MRN: [id number]

## 2021-07-15 NOTE — Assessment & Plan Note (Addendum)
CT abd/pelvis with acute pancreatitis, low density area within pancreatic head similar to prior study (early focal fluid collection or area of necrosis) Wall thickening in areas of colon including hepatic flexure and distal transverse colon through the descending colon - suspect reactive? - will defer to GI  Tolerated diet yesterday, continue low fat diet at home Will need follow up CT scan in 3rd week of January and follow up with Dr. Michail Sermon pending results of the CT scan [facility] arranging follow up) Also needs follow with general surgery outpatient, they'll come around today prior to her discharge

## 2021-07-15 NOTE — Hospital Course (Addendum)
Patient is Brittney Vance 55 year old African-American female with Kelilah Hebard past medical history significant for but not limited to breast cancer, obstructive sleep apnea, hypothyroidism as well as other comorbidities who presented with nausea vomiting and abdominal pain.  Patient was initially treated for idiopathic acute pancreatitis.  Noted to have cholelithiasis but no cholecystitis.  Patient continued to have severe pain nausea and then became febrile with tachycardia and tachypnea.  He CT scan of the abdomen pelvis on 12/17 revealed ongoing pancreatitis with possible necrosis.  Danasha Melman repeat CT scan on 12/23 showed pancreatitis with similar early fluid collection or area of necrosis.  GI and surgery were consulted.  Surgery signed off with plans for outpatient cholecystectomy.  She's gradually improved, now tolerating Jonerik Sliker diet.  Plan for outpatient repeat imaging with Gastroenterology, then GI follow up.  She's stable for discharge on 12/26.  See below for additional details

## 2021-07-15 NOTE — Plan of Care (Signed)

## 2021-07-15 NOTE — Assessment & Plan Note (Addendum)
Continue Synthroid °

## 2021-07-15 NOTE — Assessment & Plan Note (Addendum)
In remission.

## 2021-07-15 NOTE — Progress Notes (Signed)
Pt is injury free, afebrile, alert, and oriented X 4. Vital signs were within the baseline during this shift. Her abdominal pain is controlled with current pain regimen. She denies chest pain, SOB, nausea, vomiting, dizziness, signs or symptoms of bleeding or infection or acute changes during this shift. We will continue to monitor and work toward achieving the care plan goals

## 2021-07-16 DIAGNOSIS — E871 Hypo-osmolality and hyponatremia: Secondary | ICD-10-CM

## 2021-07-16 DIAGNOSIS — K8591 Acute pancreatitis with uninfected necrosis, unspecified: Secondary | ICD-10-CM | POA: Diagnosis not present

## 2021-07-16 MED ORDER — BOOST / RESOURCE BREEZE PO LIQD CUSTOM
1.0000 | Freq: Three times a day (TID) | ORAL | Status: DC
  Administered 2021-07-16 – 2021-07-17 (×3): 1 via ORAL

## 2021-07-16 MED ORDER — SCOPOLAMINE 1 MG/3DAYS TD PT72: 1.0000 | MEDICATED_PATCH | TRANSDERMAL | Status: DC

## 2021-07-16 MED ORDER — ACETAMINOPHEN 500 MG PO TABS: 1000.0000 mg | ORAL_TABLET | Freq: Once | ORAL | Status: DC

## 2021-07-16 NOTE — Assessment & Plan Note (Signed)
In remission.

## 2021-07-16 NOTE — Assessment & Plan Note (Addendum)
Resolved with treatment 

## 2021-07-16 NOTE — Assessment & Plan Note (Addendum)
Follow outpatient  

## 2021-07-16 NOTE — Progress Notes (Signed)
[facility] Progress Note  Patient: Brittney Vance     WYO:[id number]  DOA: 07/07/2021      Date of Service: the patient was seen and examined on 07/16/2021  Brief hospital course: Patient is a 55 year old African-American female with a past medical history significant for but not limited to breast cancer, obstructive sleep apnea, hypothyroidism as well as other comorbidities who presented with nausea vomiting and abdominal pain.  Patient was initially treated for idiopathic acute pancreatitis.  Noted to have cholelithiasis but no cholecystitis.  Patient continued to have severe pain nausea and then became febrile with tachycardia and tachypnea.  He CT scan of the abdomen pelvis on 12/17 revealed ongoing pancreatitis with possible necrosis.  She was started on Zosyn on 07/10/2021.  Subjective:  No new complaints.   Assessment and Plan: Acute necrotizing pancreatitis - Currently tolerating only clear liquids-she states that she tried potato soup and that made her feel bad   - add Resource breeze today and request dietician consult - Continue Zosyn - remaining management per GI  Hypothyroidism- (present on admission) - Continue Synthroid  Hyponatremia Sodium ranging in low 130s- cont NS infusion  Hypophosphatemia Resolved with treatment  Cholelithiasis - Incidental finding on CT scan  Hypokalemia- (present on admission) Resolved with treatment  Anemia, unspecified- (present on admission) Follow intermittently  Cancer of upper-outer quadrant of female breast ([place])- (present on admission) - In remission           Time spent in minutes: 35 DVT prophylaxis:  Code Status:   Code Status: Full Code  Level of Care: Level of care: Telemetry Disposition Plan:  Status is: Inpatient  Remains inpatient appropriate because: on IVF and IV Abx for necrotizing pancreatitis      Objective:   Vitals:   07/15/21 0342 07/15/21 2005 07/15/21 2126 07/16/21 0604  BP:  139/87 124/69  (!) 158/84  Pulse: 99 90 85 85  Resp: 20 16  20   Temp: 99.2 F (37.3 C) 98.7 F (37.1 C)  98.8 F (37.1 C)  TempSrc: Oral     SpO2: 99% 98%  98%  Weight:      Height:       Filed Weights   07/07/21 0234  Weight: 61.2 kg    Exam: General exam: Appears comfortable  HEENT: PERRLA, oral mucosa moist, no sclera icterus or thrush Respiratory system: Clear to auscultation. Respiratory effort normal. Cardiovascular system: S1 & S2 heard, regular rate and rhythm Gastrointestinal system: Abdomen soft,  tender in mid abdomen, nondistended. Normal bowel sounds   Central nervous system: Alert and oriented. No focal neurological deficits. Extremities: No cyanosis, clubbing or edema Skin: No rashes or ulcers Psychiatry:  Mood & affect appropriate.   Imaging and lab data Reviewed    CBC: Recent Labs  Lab 07/11/21 0604 07/12/21 0535 07/13/21 0455 07/14/21 0526 07/15/21 0315  WBC 9.9 10.4 8.2 8.5 8.2  NEUTROABS 7.5 7.8* 5.5 5.5 5.6  HGB 12.1 11.7* 11.5* 11.6* 10.3*  HCT 34.7* 34.8* 32.7* 34.0* 31.3*  MCV 86.3 89.2 86.5 88.5 89.9  PLT 226 234 231 272 774   Basic Metabolic Panel: Recent Labs  Lab 07/11/21 0604 07/12/21 0535 07/13/21 0455 07/14/21 0526 07/15/21 0315  NA 130* 131* 133* 135 131*  K 2.7* 3.5 2.9* 3.7 4.1  CL 96* 96* 98 100 99  CO2 23 25 26 26 25   GLUCOSE 96 93 138* 119* 140*  BUN <5* 7 5* <5* <5*  CREATININE 0.46 0.48 0.57 0.51 0.64  CALCIUM 8.0* 8.2* 8.0* 8.7* 8.5*  MG 1.9 2.2 2.1 1.8 2.0  PHOS 1.3* 2.1* 2.0* 3.0 3.1   GFR: Estimated Creatinine Clearance: 65 mL/min (by C-G formula based on SCr of 0.64 mg/dL).  Time spent: 35 minutes.   Author: Debbe Odea  07/16/2021 11:32 AM

## 2021-07-16 NOTE — Assessment & Plan Note (Addendum)
Improved, follow

## 2021-07-16 NOTE — Progress Notes (Signed)
[facility] Gastroenterology Progress Note  Brittney Vance 55 y.o. [birth year]  CC:  Necrotizing pancreatitis   Subjective: Patient denies abdominal pain at this time, states pain medication keeps it under control. Reports she tried baked potato soup yesterday, resulted in severe reflux and nausea which was relieved with maalox. Patient would like to stick to clear liquids. Denies nausea/vomiting at this time.  ROS : Review of Systems  Gastrointestinal:  Positive for heartburn and nausea. Negative for abdominal pain, blood in stool, constipation, diarrhea, melena and vomiting.  Genitourinary:  Negative for dysuria and urgency.     Objective: Vital signs in last 24 hours: Vitals:   07/15/21 2126 07/16/21 0604  BP:  (!) 158/84  Pulse: 85 85  Resp:  20  Temp:  98.8 F (37.1 C)  SpO2:  98%    Physical Exam:  General:  Alert, cooperative, no distress, appears stated age  Head:  Normocephalic, without obvious abnormality, atraumatic  Eyes:  Anicteric sclera, EOM's intact  Lungs:   Clear to auscultation bilaterally, respirations unlabored  Heart:  Regular rate and rhythm, S1, S2 normal  Abdomen:   Soft, epigastric tenderness, bowel sounds active all four quadrants,  no masses,     Lab Results: Recent Labs    07/14/21 0526 07/15/21 0315  NA 135 131*  K 3.7 4.1  CL 100 99  CO2 26 25  GLUCOSE 119* 140*  BUN <5* <5*  CREATININE 0.51 0.64  CALCIUM 8.7* 8.5*  MG 1.8 2.0  PHOS 3.0 3.1   Recent Labs    07/14/21 0526 07/15/21 0315  AST 28 32  ALT 36 36  ALKPHOS 119 118  BILITOT 0.5 0.6  PROT 6.5 6.2*  ALBUMIN 2.9* 2.7*   Recent Labs    07/14/21 0526 07/15/21 0315  WBC 8.5 8.2  NEUTROABS 5.5 5.6  HGB 11.6* 10.3*  HCT 34.0* 31.3*  MCV 88.5 89.9  PLT 272 282   No results for input(s): LABPROT, INR in the last 72 hours.    Assessment Necrotizing Pancreatitis - CT ab/pelvis w contrast 12/17: Necrotizing pancreatitis, bilateral pleural effusions, thickening of distal  colonic wall possibly reflecting third spacing, scattered ascites, cholelithiasis. - hgb 10.3 (decreased from 11.6), likely from hemoconcentration.  - normal renal function - no leukocytosis.   Plan: Continue clear liquid diet, can advance to full liquids as tolerated. Do not advance past full liquids. Continue supportive care. Consider repeating CT tomorrow [facility] will follow  Garnette Scheuermann PA-C 07/16/2021, 9:23 AM  Contact #  [phone]

## 2021-07-16 NOTE — Assessment & Plan Note (Addendum)
follow

## 2021-07-17 ENCOUNTER — Encounter (HOSPITAL_COMMUNITY): Payer: Self-pay | Admitting: Internal Medicine

## 2021-07-17 ENCOUNTER — Inpatient Hospital Stay (HOSPITAL_COMMUNITY): Payer: BC Managed Care – PPO

## 2021-07-17 DIAGNOSIS — K8591 Acute pancreatitis with uninfected necrosis, unspecified: Secondary | ICD-10-CM | POA: Diagnosis not present

## 2021-07-17 MED ORDER — KATE FARMS STANDARD 1.4 PO LIQD
325.0000 mL | Freq: Two times a day (BID) | ORAL | Status: DC
  Administered 2021-07-17 – 2021-07-20 (×6): 325 mL via ORAL
  Filled 2021-07-17 (×7): qty 325

## 2021-07-17 MED ORDER — ADULT MULTIVITAMIN W/MINERALS CH
1.0000 | ORAL_TABLET | Freq: Every day | ORAL | Status: DC
  Administered 2021-07-17 – 2021-07-20 (×4): 1 via ORAL
  Filled 2021-07-17 (×4): qty 1

## 2021-07-17 MED ORDER — SODIUM CHLORIDE (PF) 0.9 % IJ SOLN
INTRAMUSCULAR | Status: AC
  Filled 2021-07-17: qty 50

## 2021-07-17 MED ORDER — PROSOURCE PLUS PO LIQD
30.0000 mL | Freq: Three times a day (TID) | ORAL | Status: DC
  Administered 2021-07-17 – 2021-07-20 (×12): 30 mL via ORAL
  Filled 2021-07-17 (×10): qty 30

## 2021-07-17 MED ORDER — IOHEXOL 350 MG/ML SOLN
75.0000 mL | Freq: Once | INTRAVENOUS | Status: AC | PRN
  Administered 2021-07-17: 09:00:00 75 mL via INTRAVENOUS

## 2021-07-17 NOTE — Progress Notes (Signed)
[facility] Gastroenterology Progress Note  Brittney Vance 55 y.o. 18-Mar-1966  CC:  Necrotizing Pancreatitis   Subjective: Patient states pain is still controlled with pain medication. Tolerating clear liquids well. Denies nausea/vomiting. Awaiting to be take to her CT scan.  ROS : Review of Systems  Gastrointestinal:  Positive for abdominal pain. Negative for blood in stool, constipation, diarrhea, heartburn, melena, nausea and vomiting.     Objective: Vital signs in last 24 hours: Vitals:   07/16/21 1949 07/17/21 0542  BP: (!) 129/98 128/67  Pulse: 99 77  Resp: 20 18  Temp: 99 F (37.2 C) 97.9 F (36.6 C)  SpO2: 97% 97%    Physical Exam:  General:  Alert, cooperative, no distress, appears stated age  Head:  Normocephalic, without obvious abnormality, atraumatic  Eyes:  Anicteric sclera, EOM's intact  Lungs:   Clear to auscultation bilaterally, respirations unlabored  Heart:  Regular rate and rhythm, S1, S2 normal  Abdomen:   Soft, nondistended. Mild diffuse tenderness (worse in upper abdomen) guarding present. +BS    Lab Results: Recent Labs    07/15/21 0315  NA 131*  K 4.1  CL 99  CO2 25  GLUCOSE 140*  BUN <5*  CREATININE 0.64  CALCIUM 8.5*  MG 2.0  PHOS 3.1   Recent Labs    07/15/21 0315  AST 32  ALT 36  ALKPHOS 118  BILITOT 0.6  PROT 6.2*  ALBUMIN 2.7*   Recent Labs    07/15/21 0315  WBC 8.2  NEUTROABS 5.6  HGB 10.3*  HCT 31.3*  MCV 89.9  PLT 282   No results for input(s): LABPROT, INR in the last 72 hours.    Assessment Necrotizing Pancreatitis - CT ab/pelvis w contrast 12/17: Necrotizing pancreatitis, bilateral pleural effusions, thickening of distal colonic wall possibly reflecting third spacing, scattered ascites, cholelithiasis. - hgb 10.3 (decreased from 11.6), likely from hemoconcentration.  - normal renal function - no leukocytosis   Plan: Continue clear liquids as tolerated Continue with CT ab/pelvis today to evaluate for any  organization of pancreatic fluid collections. Continue supportive care [facility] will follow  Garnette Scheuermann PA-C 07/17/2021, 8:45 AM  Contact #  [phone]

## 2021-07-17 NOTE — Progress Notes (Signed)
Initial Nutrition Assessment  DOCUMENTATION CODES:   Non-severe (moderate) malnutrition in context of chronic illness  INTERVENTION:   -[employer] 1.4 PO BID, each provides 455 kcals and 20g protein  -Prosource Plus PO QID, each provides 100 kcals and 15g protein  -D/c Boost Breeze and Ensure  -Needs updated weight for admission.  NUTRITION DIAGNOSIS:   Moderate Malnutrition related to chronic illness as evidenced by moderate muscle depletion, percent weight loss.  GOAL:   Patient will meet greater than or equal to 90% of their needs  MONITOR:   PO intake, Supplement acceptance, Labs, Weight trends, I & O's  REASON FOR ASSESSMENT:   Consult Assessment of nutrition requirement/status  ASSESSMENT:   55 year old African-American female with a past medical history significant for but not limited to breast cancer, obstructive sleep apnea, hypothyroidism as well as other comorbidities who presented with nausea vomiting and abdominal pain.   Patient was initially treated for idiopathic acute pancreatitis.  Noted to have cholelithiasis but no cholecystitis.  Patient continued to have severe pain nausea and then became febrile with tachycardia and tachypnea.  He CT scan of the abdomen pelvis on 12/17 revealed ongoing pancreatitis with possible necrosis.  12/13: admitted 12/14: CLD 12/15: NPO -> CLD 12/16: NPO ->FLD 12/17: NPO ->CLD 12/18: FLD  Patient in room, sipping on a diluted Boost Breeze. Pt states she is concerned about the amount of sugar in the Boost Breeze supplements. Would like to try other options. Suggested plant based protein supplement given pt's report of not tolerating dairy products. Pt agreeable to trying Dillard [place] and also Prosource protein supplements. Will order. Pt with inadequate PO intakes d/t frequent diet changes since admission (x 10 days now).   Per pt, she has not been weighed this admission. States her weight on 12/9 during an office visit was  151 lbs (showed me on her phone via [place]).  Will need updated weight to confirm weight changes.   Medications: Senokot  Labs reviewed:  Low Na  NUTRITION - FOCUSED PHYSICAL EXAM:  Flowsheet Row Most Recent Value  Orbital Region No depletion  Upper Arm Region Mild depletion  Thoracic and Lumbar Region No depletion  Buccal Region No depletion  Temple Region No depletion  Clavicle Bone Region No depletion  Clavicle and Acromion Bone Region No depletion  Scapular Bone Region Moderate depletion  Dorsal Hand Mild depletion  Patellar Region Moderate depletion  Anterior Thigh Region Moderate depletion  Posterior Calf Region Moderate depletion  Edema (RD Assessment) None  Hair Unable to assess  [covered]  Eyes Reviewed  Mouth Reviewed  Skin Reviewed  [dry]       Diet Order:   Diet Order             Diet full liquid Room service appropriate? Yes; Fluid consistency: Thin  Diet effective now                   EDUCATION NEEDS:   Education needs have been addressed  Skin:  Skin Assessment: Reviewed RN Assessment  Last BM:  12/22  Height:   Ht Readings from Last 1 Encounters:  07/07/21 5\' 3"  (1.6 m)    Weight:   Wt Readings from Last 1 Encounters:  07/07/21 61.2 kg    BMI:  Body mass index is 23.91 kg/m.  Estimated Nutritional Needs:   Kcal:  1800-2000  Protein:  90-100g  Fluid:  2L/day  Clayton Bibles, MS, RD, LDN Inpatient Clinical Dietitian Contact information available via [employer]

## 2021-07-17 NOTE — Progress Notes (Addendum)
[facility] Progress Note  Patient: Brittney Vance     WRU:[id number]  DOA: 07/07/2021      Date of Service: the patient was seen and examined on 07/17/2021  Brief hospital course: Patient is a 55 year old African-American female with a past medical history of breast cancer, obstructive sleep apnea, hypothyroidism who presented with nausea vomiting and abdominal pain.  Patient was initially treated for acute gallstone pancreatitis.  Noted to have cholelithiasis but no cholecystitis. Patient continued to have severe pain nausea and then became febrile with tachycardia and tachypnea.  He CT scan of the abdomen pelvis on 12/17 revealed ongoing pancreatitis with possible necrosis.  She was started on Zosyn on 07/10/2021.  Subjective:  She has no new complaints. Drinking resource breeze mixed in water  Assessment and Plan: Acute gallstone and necrotizing pancreatitis - Currently tolerating only clear liquids  - added Resource breeze and requested dietician consult- pro-source added by dietician - CT today unchanged from prior - she continues to use Dilaudid and Oxycodone for her abdominal pain - Discussed whether Zosyn could be stopped with Dr Michail Sermon who states that it can- will dc - will need cholecystectomy at later date  Hypothyroidism- (present on admission) - Continue Synthroid  Hyponatremia Sodium ranging in low 130s- cont NS infusion as she still is not drinking much- recheck tomorrow  Hypophosphatemia Resolved with treatment  Cholelithiasis - Incidental finding on CT scan  Hypokalemia- (present on admission) Resolved with treatment  Anemia, unspecified- (present on admission) Follow intermittently  Cancer of upper-outer quadrant of female breast ([place])- (present on admission) - In remission       Time spent in minutes: 35 DVT prophylaxis:  Code Status:   Code Status: Full Code  Level of Care: Level of care: Telemetry Disposition Plan:  Status is:  Inpatient  Remains inpatient appropriate because: on IVF necrotizing pancreatitis      Objective:   Vitals:   07/16/21 1949 07/16/21 1949 07/17/21 0542 07/17/21 1550  BP:  (!) 129/98 128/67 (!) 170/85  Pulse: (!) 101 99 77 94  Resp: 18 20 18 19   Temp:  99 F (37.2 C) 97.9 F (36.6 C) 99.2 F (37.3 C)  TempSrc:  Oral Oral Oral  SpO2:  97% 97% 98%  Weight:      Height:       Filed Weights   07/07/21 0234  Weight: 61.2 kg    Exam: General exam: Appears comfortable  HEENT: PERRLA, oral mucosa moist, no sclera icterus or thrush Respiratory system: Clear to auscultation. Respiratory effort normal. Cardiovascular system: S1 & S2 heard, regular rate and rhythm Gastrointestinal system: Abdomen soft,  tender in mid abdomen, nondistended. Normal bowel sounds   Central nervous system: Alert and oriented. No focal neurological deficits. Extremities: No cyanosis, clubbing or edema Skin: No rashes or ulcers Psychiatry:  Mood & affect appropriate.    Imaging and lab data Reviewed    CBC: Recent Labs  Lab 07/11/21 0604 07/12/21 0535 07/13/21 0455 07/14/21 0526 07/15/21 0315  WBC 9.9 10.4 8.2 8.5 8.2  NEUTROABS 7.5 7.8* 5.5 5.5 5.6  HGB 12.1 11.7* 11.5* 11.6* 10.3*  HCT 34.7* 34.8* 32.7* 34.0* 31.3*  MCV 86.3 89.2 86.5 88.5 89.9  PLT 226 234 231 272 914    Basic Metabolic Panel: Recent Labs  Lab 07/11/21 0604 07/12/21 0535 07/13/21 0455 07/14/21 0526 07/15/21 0315  NA 130* 131* 133* 135 131*  K 2.7* 3.5 2.9* 3.7 4.1  CL 96* 96* 98 100 99  CO2 23 25 26 26 25   GLUCOSE 96 93 138* 119* 140*  BUN <5* 7 5* <5* <5*  CREATININE 0.46 0.48 0.57 0.51 0.64  CALCIUM 8.0* 8.2* 8.0* 8.7* 8.5*  MG 1.9 2.2 2.1 1.8 2.0  PHOS 1.3* 2.1* 2.0* 3.0 3.1    GFR: Estimated Creatinine Clearance: 65 mL/min (by C-G formula based on SCr of 0.64 mg/dL).  Time spent: 35 minutes.   Author: Debbe Odea  07/17/2021 4:05 PM

## 2021-07-18 DIAGNOSIS — K8591 Acute pancreatitis with uninfected necrosis, unspecified: Secondary | ICD-10-CM | POA: Diagnosis not present

## 2021-07-18 DIAGNOSIS — E440 Moderate protein-calorie malnutrition: Secondary | ICD-10-CM | POA: Insufficient documentation

## 2021-07-18 LAB — CBC WITH DIFFERENTIAL/PLATELET
Abs Immature Granulocytes: 0.08 10*3/uL — ABNORMAL HIGH (ref 0.00–0.07)
Basophils Absolute: 0 10*3/uL (ref 0.0–0.1)
Basophils Relative: 0 %
Eosinophils Absolute: 0.1 10*3/uL (ref 0.0–0.5)
Eosinophils Relative: 2 %
HCT: 30.9 % — ABNORMAL LOW (ref 36.0–46.0)
Hemoglobin: 10.4 g/dL — ABNORMAL LOW (ref 12.0–15.0)
Immature Granulocytes: 1 %
Lymphocytes Relative: 22 %
Lymphs Abs: 1.3 10*3/uL (ref 0.7–4.0)
MCH: 30.4 pg (ref 26.0–34.0)
MCHC: 33.7 g/dL (ref 30.0–36.0)
MCV: 90.4 fL (ref 80.0–100.0)
Monocytes Absolute: 0.6 10*3/uL (ref 0.1–1.0)
Monocytes Relative: 10 %
Neutro Abs: 3.9 10*3/uL (ref 1.7–7.7)
Neutrophils Relative %: 65 %
Platelets: 388 10*3/uL (ref 150–400)
RBC: 3.42 MIL/uL — ABNORMAL LOW (ref 3.87–5.11)
RDW: 13.3 % (ref 11.5–15.5)
WBC: 6.1 10*3/uL (ref 4.0–10.5)
nRBC: 0 % (ref 0.0–0.2)

## 2021-07-18 LAB — BASIC METABOLIC PANEL
Anion gap: 5 (ref 5–15)
BUN: <5 mg/dL — ABNORMAL LOW (ref 6–20)
CO2: 27 mmol/L (ref 22–32)
Calcium: 8.6 mg/dL — ABNORMAL LOW (ref 8.9–10.3)
Chloride: 102 mmol/L (ref 98–111)
Creatinine, Ser: 0.54 mg/dL (ref 0.44–1.00)
GFR, Estimated: >60 mL/min (ref >60)
Glucose, Bld: 103 mg/dL — ABNORMAL HIGH (ref 70–99)
Potassium: 3.8 mmol/L (ref 3.5–5.1)
Sodium: 134 mmol/L — ABNORMAL LOW (ref 135–145)

## 2021-07-18 LAB — MAGNESIUM: Magnesium: 2 mg/dL (ref 1.7–2.4)

## 2021-07-18 LAB — PHOSPHORUS: Phosphorus: 2.3 mg/dL — ABNORMAL LOW (ref 2.5–4.6)

## 2021-07-18 NOTE — Progress Notes (Signed)
[facility] Gastroenterology Progress Note  Brittney Vance 55 y.o. [birth year]   Subjective: Feels ok. Tolerating clear liquid diet. Denies N/V.  Objective: Vital signs: Vitals:   07/17/21 2022 07/18/21 0537  BP: 137/77 (!) 146/79  Pulse: 91 93  Resp: 20 20  Temp: 99.1 F (37.3 C) 98.8 F (37.1 C)  SpO2: 96% 99%    Physical Exam: Gen: alert, no acute distress, pleasant HEENT: anicteric sclera CV: RRR Chest: CTA B Abd: less tender in upper quadrant with guarding, soft, nondistended, +BS Ext: no edema  Lab Results: Recent Labs    07/18/21 0533  NA 134*  K 3.8  CL 102  CO2 27  GLUCOSE 103*  BUN <5*  CREATININE 0.54  CALCIUM 8.6*  MG 2.0  PHOS 2.3*   No results for input(s): AST, ALT, ALKPHOS, BILITOT, PROT, ALBUMIN in the last 72 hours. Recent Labs    07/18/21 0910  WBC 6.1  NEUTROABS 3.9  HGB 10.4*  HCT 30.9*  MCV 90.4  PLT 388      Assessment/Plan: Necrotizing pancreatitis slow to improve - encouraged full liquid and if tolerates slowly advance to soft diet. If not able to advance diet by Monday then will need a nasojejunal tube placed. Will f/u.   [name] 07/18/2021, 2:18 PM  Questions please call [phone] Patient ID: Brittney Vance, female   DOB: 1966-05-07, 55 y.o.   MRN: [id number]

## 2021-07-18 NOTE — Progress Notes (Signed)
PROGRESS NOTE    Brittney Vance  LYY:[id number] DOB: 1965-08-17 DOA: 07/07/2021 PCP: [name], MD  Chief Complaint  Patient presents with   Abdominal Pain    Brief Narrative:  Patient is Brittney Vance 55 year old African-American female with Wanya Bangura past medical history significant for but not limited to breast cancer, obstructive sleep apnea, hypothyroidism as well as other comorbidities who presented with nausea vomiting and abdominal pain.  Patient was initially treated for idiopathic acute pancreatitis.  Noted to have cholelithiasis but no cholecystitis.  Patient continued to have severe pain nausea and then became febrile with tachycardia and tachypnea.  He CT scan of the abdomen pelvis on 12/17 revealed ongoing pancreatitis with possible necrosis.  GI following.  See below for additional details    Assessment & Plan:   Active Problems:   Perimenopause   Malignant neoplasm of upper-outer quadrant of left breast in female, estrogen receptor negative ([place])   Acute necrotizing pancreatitis   Cholelithiasis   Malnutrition of moderate degree   Hypothyroidism   Hypokalemia   Hypophosphatemia   Hyponatremia   Anemia, unspecified   Cancer of upper-outer quadrant of female breast ([place])   Acute necrotizing pancreatitis CT abd/pelvis with acute pancreatitis, low density area within pancreatic head similar to prior study (early focal fluid collection or area of necrosis) Wall thickening in areas of colon including hepatic flexure and distal transverse colon through the descending colon - suspect reactive? - will defer to GI  Full liquid diet (will allow vegetable soup) add Resource breeze today and request dietician consult remaining management per GI If unable to advance diet by Monday, needs [place] tube placed  Cholelithiasis Will need outpatient lap chole  Malnutrition of moderate degree Dietician c/s  Hypothyroidism- (present on admission) Continue Synthroid  Hyponatremia Improved,  follow  Hypophosphatemia follow  Hypokalemia- (present on admission) Resolved with treatment  Anemia, unspecified- (present on admission) Follow intermittently  Cancer of upper-outer quadrant of female breast ([place])- (present on admission) In remission     DVT prophylaxis: lovenox Code Status: full Family Communication: none Disposition:   Status is: Inpatient  Remains inpatient appropriate because: need for continued management of necrotizing pancreatitis       Consultants:  GI Surgery  Procedures:  none  Antimicrobials:  Anti-infectives (From admission, onward)    Start     Dose/Rate Route Frequency Ordered Stop   07/10/21 1745  piperacillin-tazobactam (ZOSYN) IVPB 3.375 g  Status:  Discontinued        3.375 g 12.5 mL/hr over 240 Minutes Intravenous Every 8 hours 07/10/21 1646 07/17/21 1652   07/10/21 0600  cefTRIAXone (ROCEPHIN) 2 g in sodium chloride 0.9 % 100 mL IVPB       Note to Pharmacy: Pharmacy may adjust dosing strength / duration / interval for maximal efficacy   2 g 200 mL/hr over 30 Minutes Intravenous On call to O.R. 07/09/21 1053 07/11/21 0559       Subjective: Asking for vegetable soup  Objective: Vitals:   07/17/21 1550 07/17/21 2022 07/18/21 0537 07/18/21 1428  BP: (!) 170/85 137/77 (!) 146/79 (!) 157/87  Pulse: 94 91 93 91  Resp: 19 20 20 19   Temp: 99.2 F (37.3 C) 99.1 F (37.3 C) 98.8 F (37.1 C) 99.1 F (37.3 C)  TempSrc: Oral Oral Oral Oral  SpO2: 98% 96% 99% 99%  Weight:      Height:        Intake/Output Summary (Last 24 hours) at 07/18/2021 1853 Last data filed  at 07/18/2021 0620 Gross per 24 hour  Intake 706.41 ml  Output --  Net 706.41 ml   Filed Weights   07/07/21 0234 07/17/21 1250  Weight: 61.2 kg 59.8 kg    Examination:  General: No acute distress. Cardiovascular: RRR Lungs: unlabored Abdomen: mild LUQ TTP Neurological: Alert and oriented 3. Moves all extremities 4 with equal strength. Cranial  nerves II through XII grossly intact. Skin: Warm and dry. No rashes or lesions. Extremities: No clubbing or cyanosis. No edema.  Data Reviewed: I have personally reviewed following labs and imaging studies  CBC: Recent Labs  Lab 07/12/21 0535 07/13/21 0455 07/14/21 0526 07/15/21 0315 07/18/21 0910  WBC 10.4 8.2 8.5 8.2 6.1  NEUTROABS 7.8* 5.5 5.5 5.6 3.9  HGB 11.7* 11.5* 11.6* 10.3* 10.4*  HCT 34.8* 32.7* 34.0* 31.3* 30.9*  MCV 89.2 86.5 88.5 89.9 90.4  PLT 234 231 272 282 527    Basic Metabolic Panel: Recent Labs  Lab 07/12/21 0535 07/13/21 0455 07/14/21 0526 07/15/21 0315 07/18/21 0533  NA 131* 133* 135 131* 134*  K 3.5 2.9* 3.7 4.1 3.8  CL 96* 98 100 99 102  CO2 25 26 26 25 27   GLUCOSE 93 138* 119* 140* 103*  BUN 7 5* <5* <5* <5*  CREATININE 0.48 0.57 0.51 0.64 0.54  CALCIUM 8.2* 8.0* 8.7* 8.5* 8.6*  MG 2.2 2.1 1.8 2.0 2.0  PHOS 2.1* 2.0* 3.0 3.1 2.3*    GFR: Estimated Creatinine Clearance: 65 mL/min (by C-G formula based on SCr of 0.54 mg/dL).  Liver Function Tests: Recent Labs  Lab 07/12/21 0535 07/13/21 0455 07/14/21 0526 07/15/21 0315  AST 23 24 28  32  ALT 39 35 36 36  ALKPHOS 95 101 119 118  BILITOT 1.1 0.6 0.5 0.6  PROT 6.3* 6.2* 6.5 6.2*  ALBUMIN 2.9* 2.7* 2.9* 2.7*    CBG: No results for input(s): GLUCAP in the last 168 hours.   Recent Results (from the past 240 hour(s))  Culture, blood (routine x 2)     Status: None   Collection Time: 07/10/21  5:18 PM   Specimen: BLOOD  Result Value Ref Range Status   Specimen Description   Final    BLOOD BLOOD LEFT WRIST Performed at [facility] [address] [place], [place] [address]    Special Requests   Final    BOTTLES DRAWN AEROBIC AND ANAEROBIC Blood Culture adequate volume Performed at [facility] [address] [place], [place] [address]    Culture   Final    NO GROWTH 5 DAYS Performed at [facility], [place] [address],  [place], [place] [address]    Report Status 07/15/2021 FINAL  Final  Culture, blood (routine x 2)     Status: None   Collection Time: 07/10/21  5:18 PM   Specimen: BLOOD  Result Value Ref Range Status   Specimen Description   Final    BLOOD BLOOD LEFT FOREARM Performed at [facility] [address], [place], [place] [address]    Special Requests   Final    BOTTLES DRAWN AEROBIC AND ANAEROBIC Blood Culture adequate volume Performed at [facility] [address], [place], [place] [address]    Culture   Final    NO GROWTH 5 DAYS Performed at [facility], [place] [address] [place], [place] [address]    Report Status 07/15/2021 FINAL  Final  Radiology Studies: CT ABDOMEN PELVIS W CONTRAST  Result Date: 07/17/2021 CLINICAL DATA:  Necrotizing pancreatitis EXAM: CT ABDOMEN AND PELVIS WITH CONTRAST TECHNIQUE: Multidetector CT imaging of the abdomen and pelvis was performed using the standard protocol following bolus administration of intravenous contrast. CONTRAST:  64mL OMNIPAQUE IOHEXOL 350 MG/ML SOLN COMPARISON:  07/11/2021 FINDINGS: Lower chest: Right pleural effusion has resolved. Small left pleural effusion with left lower lobe atelectasis, similar to prior study. Heart is borderline in size. Hepatobiliary: Gallstones within the gallbladder. No focal hepatic abnormality. Pancreas: Again noted is the edematous pancreas with surrounding fluid. Amount of fluid superior to the pancreas and adjacent to the proximal stomach has increased. Again noted is Maxxwell Edgett low-density area within the pancreatic head which could reflect Masahiro Iglesia small focal fluid collection or area of necrosis. This is not significantly changed or enlarged since prior study. Spleen: No focal abnormality.  Normal size. Adrenals/Urinary Tract: No adrenal abnormality. No focal renal abnormality. No stones or hydronephrosis. Urinary bladder is unremarkable. Stomach/Bowel: Small hiatal hernia.  Again noted is mild wall thickening involving the distal transverse colon, descending colon and proximal sigmoid colon. Mild wall thickening also noted in the region of the hepatic flexure. No bowel obstruction. Vascular/Lymphatic: Scattered aortic atherosclerosis. No evidence of aneurysm or adenopathy. Reproductive: Uterus and adnexa unremarkable.  No mass. Other: Trace free fluid in the pelvis, similar to prior study. No free air. Musculoskeletal: No acute bony abnormality. IMPRESSION: Changes of acute pancreatitis. Increasing fluid adjacent to the superior aspect of the pancreas. Small low-density area within the pancreatic head is similar to prior study and could reflect Jesiel Garate small early focal fluid collection or area of necrosis. Resolved right pleural effusion. Stable small left pleural effusion with left base atelectasis. Wall thickening in areas of the colon including hepatic flexure and distal transverse colon through the descending colon. Differential considerations would include secondary/reactive inflammation, 3rd spacing of fluids, or colitis. Recommend clinical correlation. Small amount of free fluid in the pelvis. Electronically Signed   By: Rolm Baptise M.D.   On: 07/17/2021 10:57        Scheduled Meds:  (feeding supplement) PROSource Plus  30 mL Oral TID PC & HS   Chlorhexidine Gluconate Cloth  6 each Topical Daily   enoxaparin (LOVENOX) injection  40 mg Subcutaneous Q24H   feeding supplement (KATE FARMS STANDARD 1.4)  325 mL Oral BID BM   influenza vac split quadrivalent PF  0.5 mL Intramuscular Tomorrow-1000   levothyroxine  75 mcg Oral Q0600   multivitamin with minerals  1 tablet Oral Daily   senna-docusate  1 tablet Oral BID   sodium chloride flush  10-40 mL Intracatheter Q12H   Continuous Infusions:  sodium chloride 75 mL/hr at 07/18/21 0830     LOS: 10 days    Time spent: over 30 min    Fayrene Helper, MD [facility]   To contact the attending provider  between 7A-7P or the covering provider during after hours 7P-7A, please log into the web site [email or web address] and access using universal [place] password for that web site. If you do not have the password, please call the hospital operator.  07/18/2021, 6:53 PM

## 2021-07-18 NOTE — Assessment & Plan Note (Signed)
Dietician c/s

## 2021-07-19 DIAGNOSIS — K8591 Acute pancreatitis with uninfected necrosis, unspecified: Secondary | ICD-10-CM | POA: Diagnosis not present

## 2021-07-19 LAB — COMPREHENSIVE METABOLIC PANEL
ALT: 35 U/L (ref 0–44)
AST: 35 U/L (ref 15–41)
Albumin: 3 g/dL — ABNORMAL LOW (ref 3.5–5.0)
Alkaline Phosphatase: 107 U/L (ref 38–126)
Anion gap: 6 (ref 5–15)
BUN: 8 mg/dL (ref 6–20)
CO2: 25 mmol/L (ref 22–32)
Calcium: 8.8 mg/dL — ABNORMAL LOW (ref 8.9–10.3)
Chloride: 105 mmol/L (ref 98–111)
Creatinine, Ser: 0.57 mg/dL (ref 0.44–1.00)
GFR, Estimated: >60 mL/min (ref >60)
Glucose, Bld: 107 mg/dL — ABNORMAL HIGH (ref 70–99)
Potassium: 3.7 mmol/L (ref 3.5–5.1)
Sodium: 136 mmol/L (ref 135–145)
Total Bilirubin: 0.5 mg/dL (ref 0.3–1.2)
Total Protein: 7 g/dL (ref 6.5–8.1)

## 2021-07-19 LAB — PHOSPHORUS: Phosphorus: 2.6 mg/dL (ref 2.5–4.6)

## 2021-07-19 LAB — MAGNESIUM: Magnesium: 2.1 mg/dL (ref 1.7–2.4)

## 2021-07-19 MED ORDER — AMLODIPINE BESYLATE 5 MG PO TABS
5.0000 mg | ORAL_TABLET | Freq: Every day | ORAL | Status: DC
  Administered 2021-07-19: 23:00:00 5 mg via ORAL
  Filled 2021-07-19: qty 1

## 2021-07-19 NOTE — Progress Notes (Signed)
PROGRESS NOTE    Brittney Vance  KGU:[id number] DOB: Apr 13, 1966 DOA: 07/07/2021 PCP: [name], MD  Chief Complaint  Patient presents with   Abdominal Pain    Brief Narrative:  Patient is Brittney Vance 55 year old African-American female with Daryan Cagley past medical history significant for but not limited to breast cancer, obstructive sleep apnea, hypothyroidism as well as other comorbidities who presented with nausea vomiting and abdominal pain.  Patient was initially treated for idiopathic acute pancreatitis.  Noted to have cholelithiasis but no cholecystitis.  Patient continued to have severe pain nausea and then became febrile with tachycardia and tachypnea.  He CT scan of the abdomen pelvis on 12/17 revealed ongoing pancreatitis with possible necrosis.  GI following.  See below for additional details    Assessment & Plan:   Active Problems:   Perimenopause   Malignant neoplasm of upper-outer quadrant of left breast in female, estrogen receptor negative ([place])   Acute necrotizing pancreatitis   Cholelithiasis   Essential hypertension   Malnutrition of moderate degree   Hypothyroidism   Hypokalemia   Hypophosphatemia   Hyponatremia   Anemia, unspecified   Cancer of upper-outer quadrant of female breast ([place])   Acute necrotizing pancreatitis CT abd/pelvis with acute pancreatitis, low density area within pancreatic head similar to prior study (early focal fluid collection or area of necrosis) Wall thickening in areas of colon including hepatic flexure and distal transverse colon through the descending colon - suspect reactive? - will defer to GI  Advancing to low fat soft diet add Resource breeze today and request dietician consult remaining management per GI If unable to advance diet by Monday, needs [place] tube placed  Cholelithiasis Will need outpatient lap chole  Malnutrition of moderate degree Dietician c/s  Essential hypertension Significantly elevated BP's Will start  amlodipine, follow  Hypothyroidism- (present on admission) Continue Synthroid  Hyponatremia Improved, follow  Hypophosphatemia follow  Hypokalemia- (present on admission) Resolved with treatment  Anemia, unspecified- (present on admission) Follow intermittently  Cancer of upper-outer quadrant of female breast ([place])- (present on admission) In remission   DVT prophylaxis: lovenox Code Status: full Family Communication: none Disposition:   Status is: Inpatient  Remains inpatient appropriate because: need for continued management of necrotizing pancreatitis       Consultants:  GI Surgery  Procedures:  none  Antimicrobials:  Anti-infectives (From admission, onward)    Start     Dose/Rate Route Frequency Ordered Stop   07/10/21 1745  piperacillin-tazobactam (ZOSYN) IVPB 3.375 g  Status:  Discontinued        3.375 g 12.5 mL/hr over 240 Minutes Intravenous Every 8 hours 07/10/21 1646 07/17/21 1652   07/10/21 0600  cefTRIAXone (ROCEPHIN) 2 g in sodium chloride 0.9 % 100 mL IVPB       Note to Pharmacy: Pharmacy may adjust dosing strength / duration / interval for maximal efficacy   2 g 200 mL/hr over 30 Minutes Intravenous On call to O.R. 07/09/21 1053 07/11/21 0559       Subjective: No new complaints  Objective: Vitals:   07/18/21 2005 07/18/21 2252 07/19/21 0551 07/19/21 1423  BP: (!) 175/80  (!) 163/83 (!) 188/118  Pulse: 90 86 93 97  Resp: 16 16 20 18   Temp: 99.6 F (37.6 C)  99.5 F (37.5 C) 98 F (36.7 C)  TempSrc: Oral  Oral Oral  SpO2: 97% 97% 98% 99%  Weight:      Height:       No intake or output data  in the 24 hours ending 07/19/21 1547  Filed Weights   07/07/21 0234 07/17/21 1250  Weight: 61.2 kg 59.8 kg    Examination:  General: No acute distress. Cardiovascular: RRR Lungs: unlabored Abdomen: mild LUQ TTP Neurological: Alert and oriented 3. Moves all extremities 4  Cranial nerves II through XII grossly intact. Skin: Warm and  dry. No rashes or lesions. Extremities: No clubbing or cyanosis. No edema.   Data Reviewed: I have personally reviewed following labs and imaging studies  CBC: Recent Labs  Lab 07/13/21 0455 07/14/21 0526 07/15/21 0315 07/18/21 0910  WBC 8.2 8.5 8.2 6.1  NEUTROABS 5.5 5.5 5.6 3.9  HGB 11.5* 11.6* 10.3* 10.4*  HCT 32.7* 34.0* 31.3* 30.9*  MCV 86.5 88.5 89.9 90.4  PLT 231 272 282 196    Basic Metabolic Panel: Recent Labs  Lab 07/13/21 0455 07/14/21 0526 07/15/21 0315 07/18/21 0533 07/19/21 0310  NA 133* 135 131* 134* 136  K 2.9* 3.7 4.1 3.8 3.7  CL 98 100 99 102 105  CO2 26 26 25 27 25   GLUCOSE 138* 119* 140* 103* 107*  BUN 5* <5* <5* <5* 8  CREATININE 0.57 0.51 0.64 0.54 0.57  CALCIUM 8.0* 8.7* 8.5* 8.6* 8.8*  MG 2.1 1.8 2.0 2.0 2.1  PHOS 2.0* 3.0 3.1 2.3* 2.6    GFR: Estimated Creatinine Clearance: 65 mL/min (by C-G formula based on SCr of 0.57 mg/dL).  Liver Function Tests: Recent Labs  Lab 07/13/21 0455 07/14/21 0526 07/15/21 0315 07/19/21 0310  AST 24 28 32 35  ALT 35 36 36 35  ALKPHOS 101 119 118 107  BILITOT 0.6 0.5 0.6 0.5  PROT 6.2* 6.5 6.2* 7.0  ALBUMIN 2.7* 2.9* 2.7* 3.0*    CBG: No results for input(s): GLUCAP in the last 168 hours.   Recent Results (from the past 240 hour(s))  Culture, blood (routine x 2)     Status: None   Collection Time: 07/10/21  5:18 PM   Specimen: BLOOD  Result Value Ref Range Status   Specimen Description   Final    BLOOD BLOOD LEFT WRIST Performed at [facility] [address] [place], [place] [address]    Special Requests   Final    BOTTLES DRAWN AEROBIC AND ANAEROBIC Blood Culture adequate volume Performed at [facility] [address] [place], [place] [address]    Culture   Final    NO GROWTH 5 DAYS Performed at [facility], [place] [address], [place], [place] [address]    Report Status 07/15/2021 FINAL  Final  Culture, blood (routine x 2)     Status:  None   Collection Time: 07/10/21  5:18 PM   Specimen: BLOOD  Result Value Ref Range Status   Specimen Description   Final    BLOOD BLOOD LEFT FOREARM Performed at [facility] [address] [place], [place] [address]    Special Requests   Final    BOTTLES DRAWN AEROBIC AND ANAEROBIC Blood Culture adequate volume Performed at [facility] [address] [place], [place] [address]    Culture   Final    NO GROWTH 5 DAYS Performed at [facility], [place] [address], [place], [place] [address]    Report Status 07/15/2021 FINAL  Final         Radiology Studies: No results found.      Scheduled Meds:  (feeding supplement) PROSource Plus  30 mL Oral  TID PC & HS   amLODipine  5 mg Oral QHS   Chlorhexidine Gluconate Cloth  6 each Topical Daily   enoxaparin (LOVENOX) injection  40 mg Subcutaneous Q24H   feeding supplement (KATE FARMS STANDARD 1.4)  325 mL Oral BID BM   influenza vac split quadrivalent PF  0.5 mL Intramuscular Tomorrow-1000   levothyroxine  75 mcg Oral Q0600   multivitamin with minerals  1 tablet Oral Daily   senna-docusate  1 tablet Oral BID   sodium chloride flush  10-40 mL Intracatheter Q12H   Continuous Infusions:  sodium chloride 75 mL/hr at 07/19/21 1000     LOS: 11 days    Time spent: over 30 min    Fayrene Helper, MD [facility]   To contact the attending provider between 7A-7P or the covering provider during after hours 7P-7A, please log into the web site [email or web address] and access using universal [place] password for that web site. If you do not have the password, please call the hospital operator.  07/19/2021, 3:47 PM

## 2021-07-19 NOTE — Plan of Care (Signed)

## 2021-07-19 NOTE — Progress Notes (Signed)
[facility] Progress Note  Brittney Vance 55 y.o. [birth year]   Subjective: Feels ok. Frustrated with issues getting correct type of soup from nutrition. Denies worsening of abdominal pain with full liquids. Denies N/V.  Objective: Vital signs: Vitals:   07/18/21 2252 07/19/21 0551  BP:  (!) 163/83  Pulse: 86 93  Resp: 16 20  Temp:  99.5 F (37.5 C)  SpO2: 97% 98%    Physical Exam: Gen: alert, no acute distress HEENT: anicteric sclera CV: RRR Chest: CTA B Abd: mild tenderness in upper quadrants with guarding, soft, nondistended, +BS Ext: no edema  Lab Results: Recent Labs    07/18/21 0533 07/19/21 0310  NA 134* 136  K 3.8 3.7  CL 102 105  CO2 27 25  GLUCOSE 103* 107*  BUN <5* 8  CREATININE 0.54 0.57  CALCIUM 8.6* 8.8*  MG 2.0 2.1  PHOS 2.3* 2.6   Recent Labs    07/19/21 0310  AST 35  ALT 35  ALKPHOS 107  BILITOT 0.5  PROT 7.0  ALBUMIN 3.0*   Recent Labs    07/18/21 0910  WBC 6.1  NEUTROABS 3.9  HGB 10.4*  HCT 30.9*  MCV 90.4  PLT 388      Assessment/Plan: Necrotizing pancreatitis slowly improving - tolerating full liquids. Changing to low fat soft diet and she likes the change. Continue supportive care. Will f/u.   Brittney Vance 07/19/2021, 12:08 PM  Questions please call [phone] Patient ID: Brittney Vance, female   DOB: [birth year], 55 y.o.   MRN: [id number]

## 2021-07-19 NOTE — Progress Notes (Signed)
Pt is injury free, afebrile, alert, and oriented X 4. Vital signs were within the baseline during this shift. Her abdominal pain is controlled with current pain regimen. She denies chest pain, SOB, nausea, vomiting, dizziness, signs or symptoms of bleeding or infection or acute changes during this shift. We will continue to monitor and work toward achieving the care plan goals

## 2021-07-19 NOTE — Assessment & Plan Note (Addendum)
Significantly elevated BP's at times Will start amlodipine, follow outpatient

## 2021-07-20 DIAGNOSIS — K8591 Acute pancreatitis with uninfected necrosis, unspecified: Secondary | ICD-10-CM | POA: Diagnosis not present

## 2021-07-20 DIAGNOSIS — J90 Pleural effusion, not elsewhere classified: Secondary | ICD-10-CM

## 2021-07-20 LAB — CBC WITH DIFFERENTIAL/PLATELET
Abs Immature Granulocytes: 0.07 10*3/uL (ref 0.00–0.07)
Basophils Absolute: 0 10*3/uL (ref 0.0–0.1)
Basophils Relative: 1 %
Eosinophils Absolute: 0.1 10*3/uL (ref 0.0–0.5)
Eosinophils Relative: 3 %
HCT: 29.1 % — ABNORMAL LOW (ref 36.0–46.0)
Hemoglobin: 9.6 g/dL — ABNORMAL LOW (ref 12.0–15.0)
Immature Granulocytes: 2 %
Lymphocytes Relative: 37 %
Lymphs Abs: 1.7 10*3/uL (ref 0.7–4.0)
MCH: 30.4 pg (ref 26.0–34.0)
MCHC: 33 g/dL (ref 30.0–36.0)
MCV: 92.1 fL (ref 80.0–100.0)
Monocytes Absolute: 0.6 10*3/uL (ref 0.1–1.0)
Monocytes Relative: 14 %
Neutro Abs: 2 10*3/uL (ref 1.7–7.7)
Neutrophils Relative %: 43 %
Platelets: 424 10*3/uL — ABNORMAL HIGH (ref 150–400)
RBC: 3.16 MIL/uL — ABNORMAL LOW (ref 3.87–5.11)
RDW: 13.4 % (ref 11.5–15.5)
WBC: 4.6 10*3/uL (ref 4.0–10.5)
nRBC: 0 % (ref 0.0–0.2)

## 2021-07-20 LAB — COMPREHENSIVE METABOLIC PANEL
ALT: 32 U/L (ref 0–44)
AST: 29 U/L (ref 15–41)
Albumin: 2.6 g/dL — ABNORMAL LOW (ref 3.5–5.0)
Alkaline Phosphatase: 88 U/L (ref 38–126)
Anion gap: 5 (ref 5–15)
BUN: 11 mg/dL (ref 6–20)
CO2: 25 mmol/L (ref 22–32)
Calcium: 8.1 mg/dL — ABNORMAL LOW (ref 8.9–10.3)
Chloride: 105 mmol/L (ref 98–111)
Creatinine, Ser: 0.5 mg/dL (ref 0.44–1.00)
GFR, Estimated: >60 mL/min (ref >60)
Glucose, Bld: 113 mg/dL — ABNORMAL HIGH (ref 70–99)
Potassium: 3.5 mmol/L (ref 3.5–5.1)
Sodium: 135 mmol/L (ref 135–145)
Total Bilirubin: 0.4 mg/dL (ref 0.3–1.2)
Total Protein: 6.1 g/dL — ABNORMAL LOW (ref 6.5–8.1)

## 2021-07-20 LAB — PHOSPHORUS: Phosphorus: 2.8 mg/dL (ref 2.5–4.6)

## 2021-07-20 LAB — MAGNESIUM: Magnesium: 1.9 mg/dL (ref 1.7–2.4)

## 2021-07-20 MED ORDER — OXYCODONE HCL 5 MG PO TABS
5.0000 mg | ORAL_TABLET | Freq: Four times a day (QID) | ORAL | 0 refills | Status: AC | PRN
Start: 2021-07-20 — End: 2021-07-25

## 2021-07-20 MED ORDER — AMLODIPINE BESYLATE 5 MG PO TABS: 5.0000 mg | ORAL_TABLET | Freq: Every day | ORAL | 0 refills | Status: DC

## 2021-07-20 NOTE — Progress Notes (Addendum)
[facility] Progress Note  Brittney Vance 55 y.o. [birth year]   Subjective: Tolerated steak and green beans yesterday for dinner without worsened abdominal pain, N/V. Mushy stools. [employer]. Feels ok.  Objective: Vital signs: Vitals:   07/19/21 2129 07/20/21 0556  BP: (!) 146/84 (!) 154/81  Pulse: 93 85  Resp: 20 18  Temp: 98.2 F (36.8 C) 98 F (36.7 C)  SpO2: 99% 98%    Physical Exam: Gen: lethargic, no acute distress, well-nourished, pleasant HEENT: anicteric sclera CV: RRR Chest: CTA B Abd: minimal upper quadrant tenderness with minimal guarding, soft, nondistended, +BS Ext: no edema  Lab Results: Recent Labs    07/19/21 0310 07/20/21 0324  NA 136 135  K 3.7 3.5  CL 105 105  CO2 25 25  GLUCOSE 107* 113*  BUN 8 11  CREATININE 0.57 0.50  CALCIUM 8.8* 8.1*  MG 2.1 1.9  PHOS 2.6 2.8   Recent Labs    07/19/21 0310 07/20/21 0324  AST 35 29  ALT 35 32  ALKPHOS 107 88  BILITOT 0.5 0.4  PROT 7.0 6.1*  ALBUMIN 3.0* 2.6*   Recent Labs    07/18/21 0910 07/20/21 0324  WBC 6.1 4.6  NEUTROABS 3.9 2.0  HGB 10.4* 9.6*  HCT 30.9* 29.1*  MCV 90.4 92.1  PLT 388 424*      Assessment/Plan: Necrotizing pancreatitis clinically improving - tolerating solid food without worsened symptoms. Moving bowels. Ok to go home from GI standpoint on a low fat diet. Defer to Brittney Vance to transition to an as needed PO pain med regimen. Our office will arrange f/u CT scan for 3rd week of January and then f/u with me with timing pending the outpt CT results. She needs to f/u with surgeons to arrange outpt gallbladder surgery. Will sign off. Call if questions.   Brittney Vance 07/20/2021, 8:49 AM  Questions please call [phone] Patient ID: Brittney Vance, female   DOB: [birth year], 55 y.o.   MRN: [id number]

## 2021-07-20 NOTE — Discharge Summary (Signed)
Physician Discharge Summary  Brittney Vance CXK:[id number] DOB: 1965-08-14 DOA: 07/07/2021  PCP: [name], MD  Admit date: 07/07/2021 Discharge date: 07/20/2021  Time spent: 40 minutes  Recommendations for Outpatient Follow-up:  Follow outpatient CBC/CMP Follow with GI outpatient for repeat CT scan and follow up necrotizing pancreatitis Follow with general surgery outpatient to plan outpatient cholecystectomy Follow blood pressure outpatient, started on amlodipine Follow L pleural effusion outpatient, would repeat CXR    Discharge Diagnoses:  Active Problems:   Perimenopause   Malignant neoplasm of upper-outer quadrant of left breast in female, estrogen receptor negative (HCC)   Acute necrotizing pancreatitis   Cholelithiasis   Essential hypertension   Malnutrition of moderate degree   Pleural effusion, left   Hypothyroidism   Hypokalemia   Hypophosphatemia   Hyponatremia   Anemia, unspecified   Cancer of upper-outer quadrant of female breast [facility])   Discharge Condition: stable  Diet recommendation: low fat  Filed Weights   07/07/21 0234 07/17/21 1250  Weight: 61.2 kg 59.8 kg    History of present illness:  Patient is Brittney Vance 55 year old African-American female with Brittney Vance past medical history significant for but not limited to breast cancer, obstructive sleep apnea, hypothyroidism as well as other comorbidities who presented with nausea vomiting and abdominal pain.  Patient was initially treated for idiopathic acute pancreatitis.  Noted to have cholelithiasis but no cholecystitis.  Patient continued to have severe pain nausea and then became febrile with tachycardia and tachypnea.  He CT scan of the abdomen pelvis on 12/17 revealed ongoing pancreatitis with possible necrosis.  Brittney Vance repeat CT scan on 12/23 showed pancreatitis with similar early fluid collection or area of necrosis.  GI and surgery were consulted.  Surgery signed off with plans for outpatient cholecystectomy.  She's  gradually improved, now tolerating Brittney Vance diet.  Plan for outpatient repeat imaging with Gastroenterology, then GI follow up.  She's stable for discharge on 12/26.  See below for additional details  Hospital Course:  Acute necrotizing pancreatitis CT abd/pelvis with acute pancreatitis, low density area within pancreatic head similar to prior study (early focal fluid collection or area of necrosis) Wall thickening in areas of colon including hepatic flexure and distal transverse colon through the descending colon - suspect reactive? - will defer to GI  Tolerated diet yesterday, continue low fat diet at home Will need follow up CT scan in 3rd week of January and follow up with Dr. Michail Sermon pending results of the CT scan [facility] GI arranging follow up) Also needs follow with general surgery outpatient, they'll come around today prior to her discharge  Cholelithiasis Will need outpatient lap chole - Gen surg going to arrange  Pleural effusion, left Likely related to pancreatitis Follow outpatient   Malnutrition of moderate degree Dietician c/s  Essential hypertension Significantly elevated BP's at times Will start amlodipine, follow outpatient  Hypothyroidism- (present on admission) Continue Synthroid  Hyponatremia Improved, follow  Hypophosphatemia follow  Hypokalemia- (present on admission) Resolved with treatment  Anemia, unspecified- (present on admission) Follow outpatient  Cancer of upper-outer quadrant of female breast ([place])- (present on admission) In remission   Procedures: none   Consultations: GI surgery  Discharge Exam: Vitals:   07/19/21 2129 07/20/21 0556  BP: (!) 146/84 (!) 154/81  Pulse: 93 85  Resp: 20 18  Temp: 98.2 F (36.8 C) 98 F (36.7 C)  SpO2: 99% 98%   Eager to discharge No issues with meals yesterday  General: No acute distress. Cardiovascular: RRR Lungs: unlabored Abdomen:  Soft, nontender, nondistended  Neurological: Alert and  oriented 3. Moves all extremities 4 . Cranial nerves II through XII grossly intact. Skin: Warm and dry. No rashes or lesions. Extremities: No clubbing or cyanosis. No edema.  Discharge Instructions   Discharge Instructions     Call MD for:  difficulty breathing, headache or visual disturbances   Complete by: As directed    Call MD for:  extreme fatigue   Complete by: As directed    Call MD for:  hives   Complete by: As directed    Call MD for:  persistant dizziness or light-headedness   Complete by: As directed    Call MD for:  persistant nausea and vomiting   Complete by: As directed    Call MD for:  redness, tenderness, or signs of infection (pain, swelling, redness, odor or green/yellow discharge around incision site)   Complete by: As directed    Call MD for:  severe uncontrolled pain   Complete by: As directed    Call MD for:  temperature >100.4   Complete by: As directed    Diet - low sodium heart healthy   Complete by: As directed    Discharge instructions   Complete by: As directed    You were seen for necrotizing pancreatitis.  You've slowly improved.    Continue Brittney Vance low fat diet outpatient.  Avoid ANY alcohol.  You'll need to follow up with GI for repeat imaging outpatient for your necrotizing pancreatitis.  You'll need to follow up with general surgery as an outpatient for your cholecystectomy (gallbladder removal surgery) given your gallstones.  You have some fluid between your left lung and the chest wall, you'll need Brittney Vance follow up x ray with your PCP outpatient.  Your blood pressure is on the high side.  I've started you on Brittney Vance blood pressure medicine.  You'll need to follow up with your PCP outpatient to determine if this needs to be adjusted.    Return for new, recurrent, or worsening symptoms.  Please ask your PCP to request records from this hospitalization so they know what was done and what the next steps will be.   Increase activity slowly   Complete by: As  directed       Allergies as of 07/20/2021   No Known Allergies      Medication List     TAKE these medications    acetaminophen 650 MG CR tablet Commonly known as: TYLENOL Take 650 mg by mouth every 6 (six) hours as needed for pain.   amLODipine 5 MG tablet Commonly known as: NORVASC Take 1 tablet (5 mg total) by mouth at bedtime.   levothyroxine 75 MCG tablet Commonly known as: SYNTHROID TAKE 1 TABLET BY MOUTH EVERY DAY What changed: when to take this   methocarbamol 500 MG tablet Commonly known as: ROBAXIN Take 500 mg by mouth 3 (three) times daily as needed for muscle spasms.   oxyCODONE 5 MG immediate release tablet Commonly known as: Oxy IR/ROXICODONE Take 1-2 tablets (5-10 mg total) by mouth every 6 (six) hours as needed for up to 5 days for moderate pain or severe pain.   Shingrix injection Generic drug: Zoster Vaccine Adjuvanted Inject 0.5 mLs into the muscle.       No Known Allergies  Follow-up Information     Brittney Bookbinder, MD. Go to.   Specialty: General Surgery Why: Your appointment is 08/14/21 at 9:10am to discuss gallbladder surgery Please arrive 30 minutes prior to your  appointment to check in and fill out paperwork. Bring photo ID and insurance information. Contact information: [address] [place] [address] [phone]         Wilford Corner, MD Follow up.   Specialty: Gastroenterology Why: Dr. Joette Catching clinic will arrange Kaydenn Mclear follow up CT scan for you.  Then, they'll arrange Mathew Storck follow up appointment.  Call if you have questions or concerns about your follow up. Contact information: [address] [place] [address] [phone]                  The results of significant diagnostics from this hospitalization (including imaging, microbiology, ancillary and laboratory) are listed below for reference.    Significant Diagnostic Studies: CT ABDOMEN PELVIS W CONTRAST  Result Date:  07/17/2021 CLINICAL DATA:  Necrotizing pancreatitis EXAM: CT ABDOMEN AND PELVIS WITH CONTRAST TECHNIQUE: Multidetector CT imaging of the abdomen and pelvis was performed using the standard protocol following bolus administration of intravenous contrast. CONTRAST:  103mL OMNIPAQUE IOHEXOL 350 MG/ML SOLN COMPARISON:  07/11/2021 FINDINGS: Lower chest: Right pleural effusion has resolved. Small left pleural effusion with left lower lobe atelectasis, similar to prior study. Heart is borderline in size. Hepatobiliary: Gallstones within the gallbladder. No focal hepatic abnormality. Pancreas: Again noted is the edematous pancreas with surrounding fluid. Amount of fluid superior to the pancreas and adjacent to the proximal stomach has increased. Again noted is Brittney Vance low-density area within the pancreatic head which could reflect Darci Lykins small focal fluid collection or area of necrosis. This is not significantly changed or enlarged since prior study. Spleen: No focal abnormality.  Normal size. Adrenals/Urinary Tract: No adrenal abnormality. No focal renal abnormality. No stones or hydronephrosis. Urinary bladder is unremarkable. Stomach/Bowel: Small hiatal hernia. Again noted is mild wall thickening involving the distal transverse colon, descending colon and proximal sigmoid colon. Mild wall thickening also noted in the region of the hepatic flexure. No bowel obstruction. Vascular/Lymphatic: Scattered aortic atherosclerosis. No evidence of aneurysm or adenopathy. Reproductive: Uterus and adnexa unremarkable.  No mass. Other: Trace free fluid in the pelvis, similar to prior study. No free air. Musculoskeletal: No acute bony abnormality. IMPRESSION: Changes of acute pancreatitis. Increasing fluid adjacent to the superior aspect of the pancreas. Small low-density area within the pancreatic head is similar to prior study and could reflect Brittney Vance small early focal fluid collection or area of necrosis. Resolved right pleural effusion. Stable  small left pleural effusion with left base atelectasis. Wall thickening in areas of the colon including hepatic flexure and distal transverse colon through the descending colon. Differential considerations would include secondary/reactive inflammation, 3rd spacing of fluids, or colitis. Recommend clinical correlation. Small amount of free fluid in the pelvis. Electronically Signed   By: Brittney Vance.   On: 07/17/2021 10:57   CT ABDOMEN PELVIS W CONTRAST  Result Date: 07/11/2021 CLINICAL DATA:  Pancreatitis EXAM: CT ABDOMEN AND PELVIS WITH CONTRAST TECHNIQUE: Multidetector CT imaging of the abdomen and pelvis was performed using the standard protocol following bolus administration of intravenous contrast. CONTRAST:  53mL OMNIPAQUE IOHEXOL 350 MG/ML SOLN COMPARISON:  CT abdomen/pelvis 07/07/2021 FINDINGS: Lower chest: There are new small bilateral pleural effusions with adjacent atelectasis. The imaged heart is unremarkable. Hepatobiliary: The liver is unremarkable. Multiple gas containing gallstones are again seen. There is no evidence of acute cholecystitis. There is no biliary ductal dilatation. Pancreas: The pancreas is edematous with surrounding stranding and fluid consistent with acute pancreatitis. There is new focal hypoenhancement  of the pancreatic head raising concern for developing necrosis (2-33). There is no organized peripancreatic collection. Spleen: Unremarkable. Adrenals/Urinary Tract: The adrenals are unremarkable. Brittney Vance subcentimeter hypodense lesion in the left kidney is too small to characterize but likely reflects Brittney Vance small cyst. There are no stones. There is no hydronephrosis or hydroureter. The bladder is unremarkable. Stomach/Bowel: There is Brittney Vance small hiatal hernia. Stomach is otherwise unremarkable. There is mild diffuse thickening of the colonic wall involving the distal transverse, descending, and sigmoid colon which may reflect third-spacing of fluid. There is diverticulosis throughout  the left colon. There is no evidence of obstruction. Vascular/Lymphatic: There is scattered calcified atherosclerotic plaque in the nonaneurysmal abdominal aorta. The major branch vessels are patent. The main portal and splenic veins are patent. There is no abdominal or pelvic lymphadenopathy. Reproductive: The partially calcified mass in the right pelvis is unchanged, again likely reflecting Brittney Vance pedunculated fibroid. The adnexa are unremarkable. Other: There is scattered ascites, overall similar in volume to the prior study. There is no free intraperitoneal air. Small locules of gas in the left abdominal wall subcutaneous fat are likely iatrogenic. Musculoskeletal: There is no acute osseous abnormality or aggressive osseous lesion. IMPRESSION: 1. New focal area of hypoenhancement in the pancreatic head raises concern for developing necrosis in the setting of acute pancreatitis. There is no organized peripancreatic fluid collection. 2. New bilateral pleural effusions with adjacent atelectasis. 3. Thickening of the distal colonic wall may reflect third-spacing of fluid, though nonspecific infectious or inflammatory colitis could have Brittney Vance similar appearance. 4. Scattered ascites, overall similar in volume. 5. Cholelithiasis without evidence of acute cholecystitis. Electronically Signed   By: Valetta Mole Vance.   On: 07/11/2021 11:11   CT ABDOMEN PELVIS W CONTRAST  Result Date: 07/07/2021 CLINICAL DATA:  Acute pancreatitis. EXAM: CT ABDOMEN AND PELVIS WITH CONTRAST TECHNIQUE: Multidetector CT imaging of the abdomen and pelvis was performed using the standard protocol following bolus administration of intravenous contrast. CONTRAST:  146mL OMNIPAQUE IOHEXOL 300 MG/ML  SOLN COMPARISON:  09/27/2014 FINDINGS: Lower chest:  Small sliding hiatal hernia. Hepatobiliary: No focal liver abnormality.Gas containing gallstones which are multiple. No evidence of acute cholecystitis. No bile duct dilatation. Pancreas: Pancreas  expansion and peripancreatic edema. No necrosis or organized collection. Spleen: Unremarkable. Adrenals/Urinary Tract: Negative adrenals. No hydronephrosis or stone. Unremarkable bladder. Stomach/Bowel: No obstruction. No appendicitis. Left colonic diverticulosis. Vascular/Lymphatic: No acute vascular abnormality. No mass or adenopathy. Reproductive:Partially calcified mass in the right pelvis measuring up to 3.6 cm. There is an apparent stalk to the fundus of the uterus and calcified pedunculated fibroid is again favored. Other: Small volume ascites again considered reactive. Musculoskeletal: No acute abnormalities. Scoliosis and spinal degeneration. IMPRESSION: 1. Acute edematous pancreatitis without collection. 2. Cholelithiasis. Electronically Signed   By: Jorje Guild Vance.   On: 07/07/2021 04:57   US Abdomen Limited RUQ (LIVER/GB)  Result Date: 07/07/2021 CLINICAL DATA:  Elevated liver function tests EXAM: ULTRASOUND ABDOMEN LIMITED RIGHT UPPER QUADRANT COMPARISON:  07/07/2021 FINDINGS: Gallbladder: Are multiple shadowing gallstones, largest measuring 13 mm. No gallbladder wall thickening or pericholecystic fluid. Common bile duct: Diameter: 6 mm Liver: No focal lesion identified. Within normal limits in parenchymal echogenicity. Portal vein is patent on color Doppler imaging with normal direction of blood flow towards the liver. Other: Trace free fluid right upper quadrant. IMPRESSION: 1. Cholelithiasis without cholecystitis. 2. Trace free fluid right upper quadrant, consistent with reactive changes given pancreatitis on recent CT. Electronically Signed   By: Diana Eves.D.  On: 07/07/2021 18:12    Microbiology: Recent Results (from the past 240 hour(s))  Culture, blood (routine x 2)     Status: None   Collection Time: 07/10/21  5:18 PM   Specimen: BLOOD  Result Value Ref Range Status   Specimen Description   Final    BLOOD BLOOD LEFT WRIST Performed at [facility] [address] [place], [place] [address]    Special Requests   Final    BOTTLES DRAWN AEROBIC AND ANAEROBIC Blood Culture adequate volume Performed at [facility] [address], [place], [place] [address]    Culture   Final    NO GROWTH 5 DAYS Performed at [facility], [place] [address], [place], [place] [address]    Report Status 07/15/2021 FINAL  Final  Culture, blood (routine x 2)     Status: None   Collection Time: 07/10/21  5:18 PM   Specimen: BLOOD  Result Value Ref Range Status   Specimen Description   Final    BLOOD BLOOD LEFT FOREARM Performed at [facility] [address] [place], [place] [address]    Special Requests   Final    BOTTLES DRAWN AEROBIC AND ANAEROBIC Blood Culture adequate volume Performed at [facility] [address], [place], [place] [address]    Culture   Final    NO GROWTH 5 DAYS Performed at [facility], [place] [name] [place], [place] [address]    Report Status 07/15/2021 FINAL  Final     Labs: Basic Metabolic Panel: Recent Labs  Lab 07/14/21 0526 07/15/21 0315 07/18/21 0533 07/19/21 0310 07/20/21 0324  NA 135 131* 134* 136 135  K 3.7 4.1 3.8 3.7 3.5  CL 100 99 102 105 105  CO2 26 25 27 25 25   GLUCOSE 119* 140* 103* 107* 113*  BUN <5* <5* <5* 8 11  CREATININE 0.51 0.64 0.54 0.57 0.50  CALCIUM 8.7* 8.5* 8.6* 8.8* 8.1*  MG 1.8 2.0 2.0 2.1 1.9  PHOS 3.0 3.1 2.3* 2.6 2.8   Liver Function Tests: Recent Labs  Lab 07/14/21 0526 07/15/21 0315 07/19/21 0310 07/20/21 0324  AST 28 32 35 29  ALT 36 36 35 32  ALKPHOS 119 118 107 88  BILITOT 0.5 0.6 0.5 0.4  PROT 6.5 6.2* 7.0 6.1*  ALBUMIN 2.9* 2.7* 3.0* 2.6*   No results for input(s): LIPASE, AMYLASE in the last 168 hours. No results for input(s): AMMONIA in the last 168 hours. CBC: Recent Labs  Lab 07/14/21 0526 07/15/21 0315 07/18/21 0910 07/20/21 0324  WBC 8.5 8.2 6.1 4.6  NEUTROABS 5.5 5.6 3.9  2.0  HGB 11.6* 10.3* 10.4* 9.6*  HCT 34.0* 31.3* 30.9* 29.1*  MCV 88.5 89.9 90.4 92.1  PLT 272 282 388 424*   Cardiac Enzymes: No results for input(s): CKTOTAL, CKMB, CKMBINDEX, TROPONINI in the last 168 hours. BNP: BNP (last 3 results) No results for input(s): BNP in the last 8760 hours.  ProBNP (last 3 results) No results for input(s): PROBNP in the last 8760 hours.  CBG: No results for input(s): GLUCAP in the last 168 hours.     Signed:  Fayrene Helper MD.  [facility] 07/20/2021, 11:38 AM

## 2021-07-20 NOTE — Plan of Care (Signed)
°  Problem: Education: Goal: Knowledge of General Education information will improve Description: Including pain rating scale, medication(s)/side effects and non-pharmacologic comfort measures Outcome: Progressing   Problem: Health Behavior/Discharge Planning: Goal: Ability to manage health-related needs will improve Outcome: Progressing   Problem: Clinical Measurements: Goal: Ability to maintain clinical measurements within normal limits will improve Outcome: Progressing Goal: Will remain free from infection Outcome: Progressing Goal: Diagnostic test results will improve Outcome: Progressing Goal: Respiratory complications will improve Outcome: Progressing Goal: Cardiovascular complication will be avoided Outcome: Progressing   Problem: Activity: Goal: Risk for activity intolerance will decrease Outcome: Progressing   Problem: Nutrition: Goal: Adequate nutrition will be maintained Outcome: Progressing   Problem: Coping: Goal: Level of anxiety will decrease Outcome: Progressing   Problem: Elimination: Goal: Will not experience complications related to bowel motility Outcome: Progressing Goal: Will not experience complications related to urinary retention Outcome: Progressing   Problem: Safety: Goal: Ability to remain free from injury will improve Outcome: Progressing   Problem: Skin Integrity: Goal: Risk for impaired skin integrity will decrease Outcome: Progressing   Problem: Education: Goal: Knowledge of Pancreatitis treatment and prevention will improve Outcome: Progressing   Problem: Health Behavior/Discharge Planning: Goal: Ability to formulate a plan to maintain an alcohol-free life will improve Outcome: Progressing   Problem: Nutritional: Goal: Ability to achieve adequate nutritional intake will improve Outcome: Progressing   Problem: Clinical Measurements: Goal: Complications related to the disease process, condition or treatment will be avoided or  minimized Outcome: Progressing

## 2021-07-20 NOTE — Assessment & Plan Note (Signed)
Likely related to pancreatitis Follow outpatient

## 2021-07-20 NOTE — Progress Notes (Signed)
° ° ° °  Discussed with Dr. Florene Glen.  Confirming appointment on Jan 20th at [facility] [place] office with Dr. Donne Hazel.  Entered in discharge information.  Will have office call patient and confirm.  Armandina Gemma, MD [facility] practice Office: [phone]

## 2021-07-28 ENCOUNTER — Telehealth: Payer: Self-pay | Admitting: Family Medicine

## 2021-07-28 ENCOUNTER — Telehealth: Payer: Self-pay

## 2021-07-28 ENCOUNTER — Encounter: Payer: Self-pay | Admitting: Pulmonary Disease

## 2021-07-28 NOTE — Telephone Encounter (Signed)
Okay to double book sooner appointment with me, or schedule appointment with NP.

## 2021-07-28 NOTE — Telephone Encounter (Signed)
Looks like you had tried to reach the pt.

## 2021-07-28 NOTE — Telephone Encounter (Signed)
pt would like a phone call regarding recent hospitalization discharge. please advise.

## 2021-07-28 NOTE — Telephone Encounter (Signed)
I called the patient and she wants to know if she can be seen sooner than her follow up. She was concerned about the fluid on her lungs but was not told anything about the fluid at the hospital.   She reports that she is due to have Gallbladder  surgery on 08/2021 but not sure what actual day that month. FYI

## 2021-07-28 NOTE — Telephone Encounter (Signed)
Transition Care Management Unsuccessful Follow-up Telephone Call  Date of discharge and from where:  07/20/2021  [name]  Attempts:  1st Attempt  Reason for unsuccessful TCM follow-up call:  No answer/busy  Tomasa Rand, RN, BSN, CEN [place] Coordinator [phone]

## 2021-07-29 NOTE — Telephone Encounter (Signed)
I spoke with the pt and scheduled appt with Dr Halford Chessman for 08/10/21  She declined sooner visit with APP  Nothing further needed

## 2021-07-30 ENCOUNTER — Telehealth: Payer: Self-pay

## 2021-07-30 NOTE — Telephone Encounter (Signed)
Transition Care Management Unsuccessful Follow-up Telephone Call  Date of discharge and from where:  07/20/2021  [name]  Attempts:  2nd Attempt  Reason for unsuccessful TCM follow-up call:  No answer/busy  Tomasa Rand, RN, BSN, CEN [place] Coordinator [phone]

## 2021-08-04 NOTE — Progress Notes (Addendum)
[place] at [facility] [address], [place], [place] [address] [phone]  Date:  08/06/2021   Name:  Brittney [place]   DOB:  Jul 17, 1966   MRN:  [id number]  PCP:  [name], MD    Chief Complaint: Hospitalization Follow-up (07/07/2021: started Amlodipine and Oxycodone/Concerns/ questions: pt says she has been having some chills since her hospital stay that are come and go. 2. Pt would like a Rx for the Protein drink that she was given in the hospital. /)   History of Present Illness:  Brittney [place] is a 56 y.o. very pleasant female patient who presents with the following:  Patient seen today for hospital follow-up for serious illness as described below Most recent visit with myself was in August, at which time she was at her normal baseline History of hypertension, hypothyroidism, breast cancer 2014, depression, sleep apnea She is s/p bilateral mastectomy. Mother of 2 school-aged children  She became quite ill in December was hospitalized for about 2 weeks with necrotizing pancreatitis Pt notes she thought she had food poisoning over one day- she had vomiting and pain.   Her husband took her to the ER and she got admitted  Admit date: 07/07/2021 Discharge date: 07/20/2021 Recommendations for Outpatient Follow-up:  Follow outpatient CBC/CMP Follow with GI outpatient for repeat CT scan and follow up necrotizing pancreatitis Follow with general surgery outpatient to plan outpatient cholecystectomy Follow blood pressure outpatient, started on amlodipine Follow L pleural effusion outpatient, would repeat CXR   Discharge Diagnoses:  Active Problems:   Perimenopause   Malignant neoplasm of upper-outer quadrant of left breast in female, estrogen receptor negative (HCC)   Acute necrotizing pancreatitis   Cholelithiasis   Essential hypertension   Malnutrition of moderate degree   Pleural effusion, left   Hypothyroidism    Hypokalemia   Hypophosphatemia   Hyponatremia   Anemia, unspecified   Cancer of upper-outer quadrant of female breast ([place])  History of present illness:  Patient is a 56 year old African-American female with a past medical history significant for but not limited to breast cancer, obstructive sleep apnea, hypothyroidism as well as other comorbidities who presented with nausea vomiting and abdominal pain.  Patient was initially treated for idiopathic acute pancreatitis.  Noted to have cholelithiasis but no cholecystitis.  Patient continued to have severe pain nausea and then became febrile with tachycardia and tachypnea.  He CT scan of the abdomen pelvis on 12/17 revealed ongoing pancreatitis with possible necrosis.  A repeat CT scan on 12/23 showed pancreatitis with similar early fluid collection or area of necrosis.  GI and surgery were consulted.  Surgery signed off with plans for outpatient cholecystectomy.  She's gradually improved, now tolerating a diet.  Plan for outpatient repeat imaging with Gastroenterology, then GI follow up.  She's stable for discharge on 12/26.   See below for additional details   Hospital Course:  Acute necrotizing pancreatitis CT abd/pelvis with acute pancreatitis, low density area within pancreatic head similar to prior study (early focal fluid collection or area of necrosis) Wall thickening in areas of colon including hepatic flexure and distal transverse colon through the descending colon - suspect reactive? - will defer to GI  Tolerated diet yesterday, continue low fat diet at home Will need follow up CT scan in 3rd week of January and follow up with Dr. Michail Sermon pending results of the CT scan [facility] arranging follow up) Also needs follow with  general surgery outpatient, they'll come around today prior to her discharge   Cholelithiasis Will need outpatient lap chole - Gen surg going to arrange Pleural effusion, left Likely related to pancreatitis Follow  outpatient  Malnutrition of moderate degree Dietician c/s Essential hypertension Significantly elevated BP's at times Will start amlodipine, follow outpatient Hypothyroidism- (present on admission) Continue Synthroid Hyponatremia Improved, follow  Hypophosphatemia follow Hypokalemia- (present on admission) Resolved with treatment Anemia, unspecified- (present on admission) Follow outpatient Cancer of upper-outer quadrant of female breast ([place])- (present on admission) In remission    Date is not set yet for her chole.  They are doing a new CT for her - update- soon.  She is also not sure of date yet  Now she is feeling still tired, will get chills off and on No fevers noted  No pain at all She is eating small amounts - not able to tolerate a full meal as of yet No vomiting No diarrhea  She has never had pancreatitis in the past-   Her surgeon Serita Grammes contacted me: I just saw her and she is coming to see you soon. Was admitted with GSP and ended up with necrotizing pancreatitis.  Getting better. I am sending for a repeat ct and then will need lap chole.  I was going to get a cmet and lipase.  She said you were doing labs today and might be able to add these on?      Patient Active Problem List   Diagnosis Date Noted   Pleural effusion, left 07/20/2021   Malnutrition of moderate degree 07/18/2021   Hypophosphatemia 07/16/2021   Hyponatremia 07/16/2021   Acute necrotizing pancreatitis 07/15/2021   Cholelithiasis 07/15/2021   Goiter 02/06/2015   Fibroid, uterine 10/03/2014   Pain in joint, pelvic region and thigh 09/23/2014   Depression 09/23/2014   Genital herpes 03/15/2014   Hypokalemia 10/03/2013   Anemia associated with chemotherapy 09/17/2013   Neutrophils decreased ([place]) 08/13/2013   Anemia, unspecified 05/21/2013   Malignant neoplasm of upper-outer quadrant of left breast in female, estrogen receptor negative ([place]) 04/26/2013   Cancer of upper-outer  quadrant of female breast ([place]) 04/09/2013   Hypothyroidism 01/28/2013   Essential hypertension 01/25/2013   Perimenopause 01/25/2013    Past Medical History:  Diagnosis Date   Breast cancer ([place]) 2015   left, triple negative    Depression 09/23/2014   Fibroids    Hypertension    Hypothyroid    Sleep apnea     Past Surgical History:  Procedure Laterality Date   AXILLARY LYMPH NODE BIOPSY Left 10/30/2013   Procedure: AXILLARY LYMPH NODE BIOPSY;  Surgeon: [name], MD;  Location: [place];  Service: General;  Laterality: Left;   BREAST BIOPSY Left 04/24/2013   BREAST RECONSTRUCTION WITH PLACEMENT OF TISSUE EXPANDER AND FLEX HD (ACELLULAR HYDRATED DERMIS) Bilateral 10/30/2013   Procedure: BILATERAL BREAST RECONSTRUCTION WITH PLACEMENT OF TISSUE EXPANDER AND FLEX HD (ACELLULAR HYDRATED DERMIS);  Surgeon: [name], MD;  Location: [facility];  Service: Plastics;  Laterality: Bilateral;   MASTECTOMY     PORT-A-CATH REMOVAL Right 10/30/2013   Procedure: REMOVAL PORT-A-CATH;  Surgeon: [name], MD;  Location: [facility];  Service: General;  Laterality: Right;   PORTACATH PLACEMENT Right 04/27/2013   Procedure: ULTRASOUND GUIDED PORT INSERTION WITH FLUOROSCOPY;  Surgeon: [name], MD;  Location: [facility];  Service: General;  Laterality: Right;   TOTAL MASTECTOMY Bilateral 10/30/2013   Procedure: TOTAL MASTECTOMY;  Surgeon: Rhunette Croft  Rosenbower, MD;  Location: [facility];  Service: General;  Laterality: Bilateral;   TUBAL LIGATION      Social History   Tobacco Use   Smoking status: Never   Smokeless tobacco: Never  Vaping Use   Vaping Use: Never used  Substance Use Topics   Alcohol use: Not Currently   Drug use: No    Family History  Problem Relation Age of Onset   Hypertension Mother    Diabetes Mother    Alzheimer's disease Mother    Stroke Mother    Hypertension Father    Diabetes Father    Hypertension Brother    Epilepsy Son 4       being worked up for autism   Sleep  apnea Son    Thyroid disease Neg Hx     No Known Allergies  Medication list has been reviewed and updated.  Current Outpatient Medications on File Prior to Visit  Medication Sig Dispense Refill   amLODipine (NORVASC) 5 MG tablet Take 1 tablet (5 mg total) by mouth at bedtime. 30 tablet 0   levothyroxine (SYNTHROID) 75 MCG tablet TAKE 1 TABLET BY MOUTH EVERY DAY (Patient taking differently: Take 75 mcg by mouth daily before breakfast.) 90 tablet 2   methocarbamol (ROBAXIN) 500 MG tablet Take 500 mg by mouth 3 (three) times daily as needed for muscle spasms.     No current facility-administered medications on file prior to visit.    Review of Systems:  As per HPI- otherwise negative.  Physical Examination: Vitals:   08/06/21 1050  BP: (!) 142/82  Pulse: 97  Resp: 18  Temp: 98.6 F (37 C)  SpO2: 99%   Vitals:   08/06/21 1050  Weight: 142 lb 3.2 oz (64.5 kg)  Height: 5\' 3"  (1.6 m)   Body mass index is 25.19 kg/m. Ideal Body Weight: Weight in (lb) to have BMI = 25: 140.8  GEN: no acute distress.  Looks well, normal weight HEENT: Atraumatic, Normocephalic.  Bilateral TM wnl, oropharynx normal.  PEERL,EOMI.   Ears and Nose: No external deformity. CV: RRR, No M/G/R. No JVD. No thrill. No extra heart sounds. PULM: CTA B, no wheezes, crackles, rhonchi. No retractions. No resp. distress. No accessory muscle use. ABD: S, NT, ND, +BS. No rebound. No HSM.  Belly is quite benign EXTR: No c/c/e PSYCH: Normally interactive. Conversant.    Assessment and Plan: Hospital discharge follow-up  Necrotizing pancreatitis - Plan: Lipase  Hypophosphatemia - Plan: Phosphorus  Hyponatremia - Plan: Comprehensive metabolic panel  Hypokalemia - Plan: Comprehensive metabolic panel  Hypothyroidism due to acquired atrophy of thyroid  Essential hypertension - Plan: CBC, Comprehensive metabolic panel  Pleural effusion - Plan: DG Chest 2 View  Mild protein-calorie malnutrition (HCC) -  Plan: Nutritional Supplements (KATE FARMS STANDARD 1.4) LIQD  Patient seen today for follow-up from recent hospitalization with necrotizing pancreatitis likely due to gallstone.  She followed-up with general surgery today, they have ordered a repeat CT scan and will plan for elective cholecystectomy soon Thankfully Kyonna is feeling much better.  She is still a bit tired but is getting stronger I will follow-up on her labs and chest x-ray today She requests a prescription for the protein supplement drink she was using in the hospital Will plan further follow- up pending labs. I have asked her to closely monitor her condition and let us know if any return of symptoms or fevers  Signed Lamar Blinks, MD  Received her labs as below, message to  patient and surgeon Dr. Donne Hazel  Results for orders placed or performed in visit on 08/06/21  CBC  Result Value Ref Range   WBC 4.5 4.0 - 10.5 K/uL   RBC 4.15 3.87 - 5.11 Mil/uL   Platelets 287.0 150.0 - 400.0 K/uL   Hemoglobin 12.3 12.0 - 15.0 g/dL   HCT 37.4 36.0 - 46.0 %   MCV 90.0 78.0 - 100.0 fl   MCHC 33.0 30.0 - 36.0 g/dL   RDW 13.8 11.5 - 15.5 %  Comprehensive metabolic panel  Result Value Ref Range   Sodium 142 135 - 145 mEq/L   Potassium 3.5 3.5 - 5.1 mEq/L   Chloride 103 96 - 112 mEq/L   CO2 29 19 - 32 mEq/L   Glucose, Bld 87 70 - 99 mg/dL   BUN 9 6 - 23 mg/dL   Creatinine, Ser 0.51 0.40 - 1.20 mg/dL   Total Bilirubin 0.5 0.2 - 1.2 mg/dL   Alkaline Phosphatase 122 (H) 39 - 117 U/L   AST 28 0 - 37 U/L   ALT 38 (H) 0 - 35 U/L   Total Protein 7.2 6.0 - 8.3 g/dL   Albumin 4.3 3.5 - 5.2 g/dL   GFR 104.09 >60.00 mL/min   Calcium 9.6 8.4 - 10.5 mg/dL  Phosphorus  Result Value Ref Range   Phosphorus 3.0 2.3 - 4.6 mg/dL  Lipase  Result Value Ref Range   Lipase 65.0 (H) 11.0 - 59.0 U/L   Her lipase is up slightly from most recent on chart, 35 on December 18 However mild elevation is likely not significant-

## 2021-08-04 NOTE — Patient Instructions (Addendum)
It was good to see you again today, I am so glad you are feeling better!  I will be in touch with your labs Use the protein drinks as you like Please get your chest x-ray today as well If you are feeling poorly or having chills please do check your temp and report any fever over 100

## 2021-08-06 ENCOUNTER — Encounter: Payer: Self-pay | Admitting: Family Medicine

## 2021-08-06 ENCOUNTER — Ambulatory Visit: Payer: BC Managed Care – PPO | Admitting: Family Medicine

## 2021-08-06 VITALS — BP 142/82 | HR 97 | Temp 98.6°F | Resp 18 | Ht 63.0 in | Wt 142.2 lb

## 2021-08-06 DIAGNOSIS — Z09 Encounter for follow-up examination after completed treatment for conditions other than malignant neoplasm: Secondary | ICD-10-CM

## 2021-08-06 DIAGNOSIS — E871 Hypo-osmolality and hyponatremia: Secondary | ICD-10-CM

## 2021-08-06 DIAGNOSIS — E876 Hypokalemia: Secondary | ICD-10-CM

## 2021-08-06 DIAGNOSIS — K8591 Acute pancreatitis with uninfected necrosis, unspecified: Secondary | ICD-10-CM

## 2021-08-06 DIAGNOSIS — E034 Atrophy of thyroid (acquired): Secondary | ICD-10-CM

## 2021-08-06 DIAGNOSIS — I10 Essential (primary) hypertension: Secondary | ICD-10-CM | POA: Diagnosis not present

## 2021-08-06 DIAGNOSIS — J90 Pleural effusion, not elsewhere classified: Secondary | ICD-10-CM

## 2021-08-06 DIAGNOSIS — E441 Mild protein-calorie malnutrition: Secondary | ICD-10-CM

## 2021-08-06 LAB — COMPREHENSIVE METABOLIC PANEL
ALT: 38 U/L — ABNORMAL HIGH (ref 0–35)
AST: 28 U/L (ref 0–37)
Albumin: 4.3 g/dL (ref 3.5–5.2)
Alkaline Phosphatase: 122 U/L — ABNORMAL HIGH (ref 39–117)
BUN: 9 mg/dL (ref 6–23)
CO2: 29 mEq/L (ref 19–32)
Calcium: 9.6 mg/dL (ref 8.4–10.5)
Chloride: 103 mEq/L (ref 96–112)
Creatinine, Ser: 0.51 mg/dL (ref 0.40–1.20)
GFR: 104.09 mL/min (ref >60.00)
Glucose, Bld: 87 mg/dL (ref 70–99)
Potassium: 3.5 mEq/L (ref 3.5–5.1)
Sodium: 142 mEq/L (ref 135–145)
Total Bilirubin: 0.5 mg/dL (ref 0.2–1.2)
Total Protein: 7.2 g/dL (ref 6.0–8.3)

## 2021-08-06 LAB — CBC
HCT: 37.4 % (ref 36.0–46.0)
Hemoglobin: 12.3 g/dL (ref 12.0–15.0)
MCHC: 33 g/dL (ref 30.0–36.0)
MCV: 90 fl (ref 78.0–100.0)
Platelets: 287 10*3/uL (ref 150.0–400.0)
RBC: 4.15 Mil/uL (ref 3.87–5.11)
RDW: 13.8 % (ref 11.5–15.5)
WBC: 4.5 10*3/uL (ref 4.0–10.5)

## 2021-08-06 LAB — PHOSPHORUS: Phosphorus: 3 mg/dL (ref 2.3–4.6)

## 2021-08-06 LAB — LIPASE: Lipase: 65 U/L — ABNORMAL HIGH (ref 11.0–59.0)

## 2021-08-06 MED ORDER — KATE FARMS STANDARD 1.4 EN LIQD: 1.0000 | Freq: Three times a day (TID) | ENTERAL | 2 refills | Status: DC | PRN

## 2021-08-07 ENCOUNTER — Ambulatory Visit (HOSPITAL_BASED_OUTPATIENT_CLINIC_OR_DEPARTMENT_OTHER)
Admission: RE | Admit: 2021-08-07 | Discharge: 2021-08-07 | Disposition: A | Discharge status: Home / Self Care | Payer: BC Managed Care – PPO | Source: Ambulatory Visit | Attending: Family Medicine | Admitting: Family Medicine

## 2021-08-07 ENCOUNTER — Encounter: Payer: Self-pay | Admitting: Family Medicine

## 2021-08-07 ENCOUNTER — Other Ambulatory Visit: Payer: Self-pay

## 2021-08-07 DIAGNOSIS — J90 Pleural effusion, not elsewhere classified: Secondary | ICD-10-CM | POA: Insufficient documentation

## 2021-08-10 ENCOUNTER — Ambulatory Visit: Payer: BC Managed Care – PPO | Admitting: Pulmonary Disease

## 2021-08-11 ENCOUNTER — Other Ambulatory Visit: Payer: Self-pay | Admitting: General Surgery

## 2021-08-11 DIAGNOSIS — K851 Biliary acute pancreatitis without necrosis or infection: Secondary | ICD-10-CM

## 2021-08-19 ENCOUNTER — Encounter: Payer: Self-pay | Admitting: Family Medicine

## 2021-08-24 ENCOUNTER — Other Ambulatory Visit: Payer: Self-pay

## 2021-08-24 ENCOUNTER — Ambulatory Visit
Admission: RE | Admit: 2021-08-24 | Discharge: 2021-08-24 | Disposition: A | Discharge status: Home / Self Care | Payer: BC Managed Care – PPO | Source: Ambulatory Visit | Attending: General Surgery | Admitting: General Surgery

## 2021-08-24 ENCOUNTER — Other Ambulatory Visit: Payer: BC Managed Care – PPO

## 2021-08-24 DIAGNOSIS — K851 Biliary acute pancreatitis without necrosis or infection: Secondary | ICD-10-CM

## 2021-09-02 ENCOUNTER — Inpatient Hospital Stay: Payer: BC Managed Care – PPO | Admitting: Pulmonary Disease

## 2021-09-03 ENCOUNTER — Encounter: Payer: Self-pay | Admitting: Endocrinology

## 2021-09-04 ENCOUNTER — Other Ambulatory Visit (HOSPITAL_COMMUNITY): Payer: Self-pay

## 2021-09-04 ENCOUNTER — Other Ambulatory Visit: Payer: Self-pay

## 2021-09-04 ENCOUNTER — Ambulatory Visit: Payer: BC Managed Care – PPO | Admitting: Pulmonary Disease

## 2021-09-04 DIAGNOSIS — E038 Other specified hypothyroidism: Secondary | ICD-10-CM

## 2021-09-04 MED ORDER — LEVOTHYROXINE SODIUM 75 MCG PO TABS
75.0000 ug | ORAL_TABLET | Freq: Every day | ORAL | 3 refills | Status: DC
  Filled 2021-09-04: qty 90, 90d supply, fill #0

## 2021-09-07 ENCOUNTER — Other Ambulatory Visit: Payer: Self-pay

## 2021-09-07 ENCOUNTER — Other Ambulatory Visit (HOSPITAL_COMMUNITY): Payer: Self-pay

## 2021-09-07 DIAGNOSIS — E038 Other specified hypothyroidism: Secondary | ICD-10-CM

## 2021-09-07 MED ORDER — LEVOTHYROXINE SODIUM 75 MCG PO TABS: 75.0000 ug | ORAL_TABLET | Freq: Every day | ORAL | 3 refills | Status: DC

## 2021-09-08 ENCOUNTER — Other Ambulatory Visit (HOSPITAL_COMMUNITY): Payer: Self-pay

## 2021-11-03 ENCOUNTER — Telehealth: Payer: Self-pay | Admitting: Pharmacist

## 2021-11-03 NOTE — Telephone Encounter (Signed)
Patient appearing on report for [facility] - Hypertension Control report due to last documented ambulatory blood pressure of 142/82 on 08/06/2021. Next appointment with PCP I snot currently scheduled  ? ?Current medications: amlodipine 5 mg by mouth once daily Started 06/2021 during hospitalization and only given 30 day supply. My Chart message mentions that is patient's home blood pressure readings have been less thatn 120/80 could lower dose ot 0.5 tablet or 2.'5mg'$  daily, however patient did not respond further to message with home blood pressure readings. Last filled amlodipine '5mg'$  for 30 tablets on 07/20/2021.  ?Also looks like she was having difficulty getting levothyroxine filled but Dr Loanne Drilling has sent in Rx for #90 09/04/2021 but may have been sent to incorrect pharmacy. Will check with patient about levothyroxine as well when she returns call. ? ?Outreached patient to discuss hypertension control and medication management. However unable to reach patient. Left Message on VM with my contact information - [phone] or [phone] ? ? ?Brittney [place], PharmD ?Clinical Pharmacist ?[facility] ?[place] ? ? ?

## 2021-11-27 ENCOUNTER — Inpatient Hospital Stay: Payer: BC Managed Care – PPO | Admitting: Pulmonary Disease

## 2022-02-01 ENCOUNTER — Other Ambulatory Visit: Payer: Self-pay

## 2022-02-01 DIAGNOSIS — E038 Other specified hypothyroidism: Secondary | ICD-10-CM

## 2022-02-01 MED ORDER — LEVOTHYROXINE SODIUM 75 MCG PO TABS: 75.0000 ug | ORAL_TABLET | Freq: Every day | ORAL | 0 refills | Status: DC

## 2022-02-26 ENCOUNTER — Other Ambulatory Visit (HOSPITAL_COMMUNITY): Payer: Self-pay

## 2022-02-26 ENCOUNTER — Other Ambulatory Visit: Payer: Self-pay | Admitting: Internal Medicine

## 2022-02-26 ENCOUNTER — Other Ambulatory Visit: Payer: Self-pay

## 2022-02-26 DIAGNOSIS — E038 Other specified hypothyroidism: Secondary | ICD-10-CM

## 2022-02-26 MED ORDER — LEVOTHYROXINE SODIUM 75 MCG PO TABS
75.0000 ug | ORAL_TABLET | Freq: Every day | ORAL | 0 refills | Status: DC
  Filled 2022-02-26: qty 15, 15d supply, fill #0

## 2022-02-27 ENCOUNTER — Other Ambulatory Visit (HOSPITAL_COMMUNITY): Payer: Self-pay

## 2022-03-17 ENCOUNTER — Other Ambulatory Visit (HOSPITAL_COMMUNITY): Payer: Self-pay

## 2022-04-01 ENCOUNTER — Telehealth: Payer: Self-pay | Admitting: Adult Health

## 2022-04-01 NOTE — Telephone Encounter (Signed)
Rescheduled appointment per provider PAL. Patient is aware of the changes made to her upcoming appointment. 

## 2022-04-23 ENCOUNTER — Encounter: Payer: BC Managed Care – PPO | Admitting: Adult Health

## 2022-05-07 ENCOUNTER — Encounter: Payer: BC Managed Care – PPO | Admitting: Adult Health

## 2022-07-02 ENCOUNTER — Ambulatory Visit: Payer: BC Managed Care – PPO | Admitting: Endocrinology

## 2022-07-02 ENCOUNTER — Encounter: Payer: BC Managed Care – PPO | Admitting: Adult Health

## 2022-07-27 ENCOUNTER — Telehealth: Payer: Self-pay | Admitting: Family Medicine

## 2022-07-27 ENCOUNTER — Other Ambulatory Visit: Payer: Self-pay

## 2022-07-27 DIAGNOSIS — R748 Abnormal levels of other serum enzymes: Secondary | ICD-10-CM

## 2022-07-27 DIAGNOSIS — I10 Essential (primary) hypertension: Secondary | ICD-10-CM

## 2022-07-27 DIAGNOSIS — E034 Atrophy of thyroid (acquired): Secondary | ICD-10-CM

## 2022-07-27 DIAGNOSIS — E038 Other specified hypothyroidism: Secondary | ICD-10-CM

## 2022-07-27 DIAGNOSIS — E876 Hypokalemia: Secondary | ICD-10-CM

## 2022-07-27 MED ORDER — LEVOTHYROXINE SODIUM 75 MCG PO TABS: 75.0000 ug | ORAL_TABLET | Freq: Every day | ORAL | 0 refills | Status: DC

## 2022-07-27 NOTE — Telephone Encounter (Signed)
Prescription Request  07/27/2022  Is this a "Controlled Substance" medicine? No  LOV: Visit date not found  What is the name of the medication or equipment?   levothyroxine (SYNTHROID) 75 MCG tablet [[id number]]   Have you contacted your pharmacy to request a refill? No   Which pharmacy would you like this sent to?   [id number] - [place], [address] [place] [address] Phone: [phone] Fax: [phone]    Patient notified that their request is being sent to the clinical staff for review and that they should receive a response within 2 business days.   Please advise at Mobile [phone] (mobile)

## 2022-07-27 NOTE — Telephone Encounter (Signed)
See below. Please advise on the best way to proceed with the pts care.

## 2022-07-27 NOTE — Telephone Encounter (Signed)
30 day Rx was sent to the pharmacy, letter sent to MyChart to schedule appointment.

## 2022-07-28 ENCOUNTER — Telehealth: Payer: Self-pay | Admitting: Family Medicine

## 2022-07-28 NOTE — Telephone Encounter (Signed)
Patient is calling to get  the cost of labs for her thyroid to be checked. Patient was not sure which lab would be checked. Advised order has not been placed yet so once the order is placed, we'll know which ones to find pricing for. Please advise which labs need to be done.

## 2022-07-28 NOTE — Telephone Encounter (Signed)
Is this something that you can help with?

## 2022-07-28 NOTE — Addendum Note (Signed)
Addended by: Lamar Blinks C on: 07/28/2022 12:12 PM   Modules accepted: Orders

## 2022-07-29 LAB — HEPATIC FUNCTION PANEL
ALT: 16 U/L (ref 7–35)
AST: 19 (ref 13–35)
Alkaline Phosphatase: 127 — AB (ref 25–125)
Bilirubin, Total: 0.3

## 2022-07-29 LAB — BASIC METABOLIC PANEL
BUN: 12 (ref 4–21)
CO2: 25 — AB (ref 13–22)
Chloride: 104 (ref 99–108)
Creatinine: 0.8 (ref 0.5–1.1)
Glucose: 92
Potassium: 4 mEq/L (ref 3.5–5.1)
Sodium: 143 (ref 137–147)

## 2022-07-29 LAB — CBC AND DIFFERENTIAL
Hemoglobin: 13.5 (ref 12.0–16.0)
Platelets: 147 10*3/uL — AB (ref 150–400)
WBC: 4.9

## 2022-07-29 LAB — CBC: RBC: 4.49 (ref 3.87–5.11)

## 2022-07-29 LAB — COMPREHENSIVE METABOLIC PANEL
Albumin: 4.5 (ref 3.5–5.0)
Calcium: 9.7 (ref 8.7–10.7)
Globulin: 2.8
eGFR: 93

## 2022-07-29 LAB — TSH: TSH: 0.89 (ref 0.41–5.90)

## 2022-07-29 NOTE — Telephone Encounter (Signed)
Left message for pt to return my call.

## 2022-07-30 MED ORDER — LEVOTHYROXINE SODIUM 75 MCG PO TABS: 75.0000 ug | ORAL_TABLET | Freq: Every day | ORAL | 3 refills | Status: DC

## 2022-07-30 NOTE — Addendum Note (Signed)
Addended by: Lamar Blinks C on: 07/30/2022 02:56 PM   Modules accepted: Orders

## 2022-07-30 NOTE — Telephone Encounter (Signed)
Left voicemail to return my call

## 2022-08-10 ENCOUNTER — Encounter (HOSPITAL_BASED_OUTPATIENT_CLINIC_OR_DEPARTMENT_OTHER): Payer: Self-pay | Admitting: Pulmonary Disease

## 2022-08-10 DIAGNOSIS — G4733 Obstructive sleep apnea (adult) (pediatric): Secondary | ICD-10-CM

## 2022-08-12 NOTE — Telephone Encounter (Signed)
I have reached out to pt twice by phone and once by mychart with no repsonse. Here is the response I received from billing at the time of inquiry if pt does reach back out to [place].  [1/4 2:09 PM] [place], Dawn TSH is 55.00, CMP 30.00, CBC no diff is 24.00 [1/4 2:11 PM] Pollie Friar, Arrie Aran that is our price in epic now, she may have a deductible and that is why she wants to know, not sure but we can't say what her insurance will make her responsible for but she could call them and see if she can find out with them.  CPT TSH [id number], [id number], [id number].

## 2022-08-15 NOTE — Patient Instructions (Incomplete)
It was good to see you again today, I will be in touch with your labs soon as possible Recommend COVID booster, shingles vaccine series Flu shot today!   Please do keep an eye on your BP at home- if you are running higher than 135/85 on a regular basis we will want to go back on a BP medication

## 2022-08-15 NOTE — Progress Notes (Unsigned)
[place] at [facility] [address], [place], [place] [address] [phone]  Date:  08/19/2022   Name:  Br[place]   DOB:  [birth year]   MRN:  [id number]  PCP:  [name], MD    Chief Complaint: No chief complaint on file.   History of Present Illness:  Brittney [place] is a 57 y.o. very pleasant female patient who presents with the following:  Patient seen today for physical exam  History of hypertension, hypothyroidism, breast cancer 2014, depression, sleep apnea She is s/p bilateral mastectomy. Mother of 2 school-aged children Most recent visit with myself about one year ago-at that time she had recently been quite ill and was inpatient for about 2 weeks with necrotizing pancreatitis  Thankfully she recovered, in the interim she has followed up with endocrine and surgery and also with her gastroenterologist She had a repeat CT scan per Dr. Serita Grammes on January 30 of last year, it looks like they recommended a cholecystectomy but she has not yet proceeded with this  Recommend COVID booster  Pap smear Recommend Shingrix Flu shot Labs on chart from earlier this month-CMP, CBC  Amlodipine Levothyroxine  Patient Active Problem List   Diagnosis Date Noted   Pleural effusion, left 07/20/2021   Malnutrition of moderate degree 07/18/2021   Hypophosphatemia 07/16/2021   Hyponatremia 07/16/2021   Acute necrotizing pancreatitis 07/15/2021   Cholelithiasis 07/15/2021   Goiter 02/06/2015   Fibroid, uterine 10/03/2014   Pain in joint, pelvic region and thigh 09/23/2014   Depression 09/23/2014   Genital herpes 03/15/2014   Hypokalemia 10/03/2013   Anemia associated with chemotherapy 09/17/2013   Neutrophils decreased ([place]) 08/13/2013   Anemia, unspecified 05/21/2013   Malignant neoplasm of upper-outer quadrant of left breast in female, estrogen receptor negative ([place]) 04/26/2013   Cancer of upper-outer quadrant of  female breast ([place]) 04/09/2013   Hypothyroidism 01/28/2013   Essential hypertension 01/25/2013   Perimenopause 01/25/2013    Past Medical History:  Diagnosis Date   Breast cancer ([place]) 2015   left, triple negative    Depression 09/23/2014   Fibroids    Hypertension    Hypothyroid    Sleep apnea     Past Surgical History:  Procedure Laterality Date   AXILLARY LYMPH NODE BIOPSY Left 10/30/2013   Procedure: AXILLARY LYMPH NODE BIOPSY;  Surgeon: [name], MD;  Location: [facility];  Service: General;  Laterality: Left;   BREAST BIOPSY Left 04/24/2013   BREAST RECONSTRUCTION WITH PLACEMENT OF TISSUE EXPANDER AND FLEX HD (ACELLULAR HYDRATED DERMIS) Bilateral 10/30/2013   Procedure: BILATERAL BREAST RECONSTRUCTION WITH PLACEMENT OF TISSUE EXPANDER AND FLEX HD (ACELLULAR HYDRATED DERMIS);  Surgeon: [name], MD;  Location: [facility];  Service: Plastics;  Laterality: Bilateral;   MASTECTOMY     PORT-A-CATH REMOVAL Right 10/30/2013   Procedure: REMOVAL PORT-A-CATH;  Surgeon: [name], MD;  Location: [facility];  Service: General;  Laterality: Right;   PORTACATH PLACEMENT Right 04/27/2013   Procedure: ULTRASOUND GUIDED PORT INSERTION WITH FLUOROSCOPY;  Surgeon: [name], MD;  [facility];  Service: General;  Laterality: Right;   TOTAL MASTECTOMY Bilateral 10/30/2013   Procedure: TOTAL MASTECTOMY;  Surgeon: [name], MD;  Location: [facility];  Service: General;  Laterality: Bilateral;   TUBAL LIGATION      Social History   Tobacco Use   Smoking status: Never   Smokeless tobacco: Never  Vaping Use  Vaping Use: Never used  Substance Use Topics   Alcohol use: Not Currently   Drug use: No    Family History  Problem Relation Age of Onset   Hypertension Mother    Diabetes Mother    Alzheimer's disease Mother    Stroke Mother    Hypertension Father    Diabetes Father    Hypertension Brother    Epilepsy Son 4       being worked up for autism   Sleep apnea Son     Thyroid disease Neg Hx     No Known Allergies  Medication list has been reviewed and updated.  Current Outpatient Medications on File Prior to Visit  Medication Sig Dispense Refill   amLODipine (NORVASC) 5 MG tablet Take 1 tablet (5 mg total) by mouth at bedtime. 30 tablet 0   levothyroxine (SYNTHROID) 75 MCG tablet Take 1 tablet  by mouth daily before breakfast. 90 tablet 3   methocarbamol (ROBAXIN) 500 MG tablet Take 500 mg by mouth 3 (three) times daily as needed for muscle spasms.     Nutritional Supplements (KATE FARMS STANDARD 1.4) LIQD 1 Bottle by Enteral route 3 (three) times daily as needed. 2000 mL 2   No current facility-administered medications on file prior to visit.    Review of Systems:  As per HPI- otherwise negative.   Physical Examination: There were no vitals filed for this visit. There were no vitals filed for this visit. There is no height or weight on file to calculate BMI. Ideal Body Weight:    GEN: no acute distress. HEENT: Atraumatic, Normocephalic.  Ears and Nose: No external deformity. CV: RRR, No M/G/R. No JVD. No thrill. No extra heart sounds. PULM: CTA B, no wheezes, crackles, rhonchi. No retractions. No resp. distress. No accessory muscle use. ABD: S, NT, ND, +BS. No rebound. No HSM. EXTR: No c/c/e PSYCH: Normally interactive. Conversant.    Assessment and Plan: *** Physical exam today.  Encouraged healthy diet and exercise routine Will plan further follow- up pending labs.  Signed Lamar Blinks, MD

## 2022-08-19 ENCOUNTER — Encounter: Payer: Self-pay | Admitting: Family Medicine

## 2022-08-19 ENCOUNTER — Ambulatory Visit (INDEPENDENT_AMBULATORY_CARE_PROVIDER_SITE_OTHER): Payer: Medicaid Other | Admitting: Family Medicine

## 2022-08-19 VITALS — BP 122/72 | HR 94 | Temp 98.5°F | Resp 18 | Ht 63.0 in | Wt 150.4 lb

## 2022-08-19 DIAGNOSIS — Z23 Encounter for immunization: Secondary | ICD-10-CM

## 2022-08-19 DIAGNOSIS — Z131 Encounter for screening for diabetes mellitus: Secondary | ICD-10-CM | POA: Diagnosis not present

## 2022-08-19 DIAGNOSIS — Z13220 Encounter for screening for lipoid disorders: Secondary | ICD-10-CM | POA: Diagnosis not present

## 2022-08-19 DIAGNOSIS — I10 Essential (primary) hypertension: Secondary | ICD-10-CM

## 2022-08-19 DIAGNOSIS — Z0000 Encounter for general adult medical examination without abnormal findings: Secondary | ICD-10-CM

## 2022-08-19 DIAGNOSIS — R7303 Prediabetes: Secondary | ICD-10-CM | POA: Diagnosis not present

## 2022-08-19 DIAGNOSIS — R748 Abnormal levels of other serum enzymes: Secondary | ICD-10-CM

## 2022-08-19 DIAGNOSIS — E034 Atrophy of thyroid (acquired): Secondary | ICD-10-CM

## 2022-08-19 LAB — LIPID PANEL
Cholesterol: 253 mg/dL — ABNORMAL HIGH (ref 0–200)
HDL: 77.4 mg/dL (ref >39.00)
LDL Cholesterol: 163 mg/dL — ABNORMAL HIGH (ref 0–99)
NonHDL: 175.24
Total CHOL/HDL Ratio: 3
Triglycerides: 60 mg/dL (ref 0.0–149.0)
VLDL: 12 mg/dL (ref 0.0–40.0)

## 2022-08-19 LAB — ALKALINE PHOSPHATASE: Alkaline Phosphatase: 115 U/L (ref 39–117)

## 2022-08-19 LAB — HEMOGLOBIN A1C: Hgb A1c MFr Bld: 5.7 % (ref 4.6–6.5)

## 2022-08-19 LAB — GAMMA GT: GGT: 26 U/L (ref 7–51)

## 2022-08-20 ENCOUNTER — Encounter: Payer: Self-pay | Admitting: *Deleted

## 2022-08-30 ENCOUNTER — Encounter (HOSPITAL_BASED_OUTPATIENT_CLINIC_OR_DEPARTMENT_OTHER): Payer: Self-pay | Admitting: Pulmonary Disease

## 2022-08-30 ENCOUNTER — Ambulatory Visit (INDEPENDENT_AMBULATORY_CARE_PROVIDER_SITE_OTHER): Payer: Medicaid Other | Admitting: Pulmonary Disease

## 2022-08-30 VITALS — BP 146/78 | HR 102 | Temp 98.2°F | Ht 62.0 in | Wt 148.4 lb

## 2022-08-30 DIAGNOSIS — G4733 Obstructive sleep apnea (adult) (pediatric): Secondary | ICD-10-CM | POA: Diagnosis not present

## 2022-08-30 NOTE — Patient Instructions (Signed)
Follow up in 1 year.

## 2022-08-30 NOTE — Progress Notes (Signed)
[facility]  Chief Complaint  Patient presents with   Follow-up    Pt states she was admitted into the Hospital in December for 2 weeks. Pt states she is feeling better and started CPAP therapy.    Past Surgical History:  She  has a past surgical history that includes Tubal ligation; Portacath placement (Right, 04/27/2013); Breast biopsy (Left, 04/24/2013); Total mastectomy (Bilateral, 10/30/2013); Axillary lymph node biopsy (Left, 10/30/2013); Port-a-cath removal (Right, 10/30/2013); Breast reconstruction with placement of tissue expander and flex hd (acellular hydrated dermis) (Bilateral, 10/30/2013); and Mastectomy.  Past Medical History:  Breast cancer, Depression, Fibroids, HTN, Hypothyroidism, Hiatal hernia, Diverticulosis, Pancreatitis December 2022  Constitutional:  BP (!) 146/78 (BP Location: Left Arm, Patient Position: Sitting, Cuff Size: Normal)   Pulse (!) 102   Temp 98.2 F (36.8 C) (Oral)   Ht '5\' 2"'$  (1.575 m)   Wt 148 lb 6.4 oz (67.3 kg)   LMP 03/10/2013 (LMP Unknown)   SpO2 99%   BMI 27.14 kg/m   Brief Summary:  Brittney Vance is a 57 y.o. female with obstructive sleep apnea.      Subjective:   I last saw her in 2021.    She had pancreatitis last year.  Since recovered.  She uses CPAP nightly.  Has nasal cushion mask.  Face gets dry around her mask.  Pressure setting is comfortable.  She is travelling with her daughter's high school later this Spring to [place] for a school trip.  She is worried about falling asleep on the bus, and whether she should use her CPAP then.  She doesn't know how she would get power for her machine on the bus.  Physical Exam:   Appearance - well kempt   ENMT - no sinus tenderness, no oral exudate, no LAN, Mallampati 4 airway, no stridor  Respiratory - equal breath sounds bilaterally, no wheezing or rales  CV - s1s2 regular rate and rhythm, no murmurs  Ext - no clubbing, no edema  Skin - no  rashes  Psych - normal mood and affect   Sleep Tests:  PSG 07/22/18 >> AHI 33.8, SpO2 low 81%.  CPAP 10 cm H2O >> AHI 0. CPAP 07/28/22 to 08/26/22 >> used on 29 of 30 nights with average 6 hrs 30 min.  Average AHI 2.9 with CPAP 11 cm H2O  Cardiac Tests:  Echo 04/30/13 >> mild LVH, EF 55 to 60%, grade 1 DD   Social History:  She  reports that she has never smoked. She has never used smokeless tobacco. She reports that she does not currently use alcohol. She reports that she does not use drugs.  Family History:  Her family history includes Alzheimer's disease in her mother; Diabetes in her father and mother; Epilepsy (age of onset: 51) in her son; Hypertension in her brother, father, and mother; Sleep apnea in her son; Stroke in her mother.     Assessment/Plan:   Obstructive sleep apnea. - she is compliant with CPAP and reports benefit from therapy - she uses [employer] for her DME - current CPAP ordered January 2020 - continue CPAP 11 cm H2O - discussed travel CPAP machines, and back up power sources for her CPAP - advised her to look on-line for newer CPAP mask options and let me know if she finds one she would like to order through her DME   Time Spent Involved in Patient Care on Day of Examination:  25 minutes  Follow up:  Patient Instructions  Follow up in 1 year  Medication List:   Allergies as of 08/30/2022   No Known Allergies      Medication List        Accurate as of August 30, 2022  9:13 AM. If you have any questions, ask your nurse or doctor.          STOP taking these medications    Dillard [place] Standard 1.4 Liqd Stopped by: Chesley Mires, MD       TAKE these medications    levothyroxine 75 MCG tablet Commonly known as: SYNTHROID Take 1 tablet  by mouth daily before breakfast.   methocarbamol 500 MG tablet Commonly known as: ROBAXIN Take 500 mg by mouth 3 (three) times daily as needed for muscle spasms.        Signature:  Chesley Mires,  MD [place] Pager - [phone] 08/30/2022, 9:13 AM

## 2022-09-06 NOTE — Telephone Encounter (Signed)
Patient checking on CPAP supplies. Patient phone number is [phone].

## 2022-09-09 NOTE — Telephone Encounter (Signed)
PCCs, has this been sent to [facility]? Thanks.

## 2022-09-11 DIAGNOSIS — G4733 Obstructive sleep apnea (adult) (pediatric): Secondary | ICD-10-CM | POA: Diagnosis not present

## 2022-10-10 ENCOUNTER — Encounter: Payer: Self-pay | Admitting: Family Medicine

## 2022-10-11 MED ORDER — AMLODIPINE BESYLATE 5 MG PO TABS: 5.0000 mg | ORAL_TABLET | Freq: Every day | ORAL | 3 refills | Status: DC

## 2022-12-16 ENCOUNTER — Other Ambulatory Visit: Payer: Self-pay

## 2022-12-16 DIAGNOSIS — E038 Other specified hypothyroidism: Secondary | ICD-10-CM

## 2022-12-17 MED ORDER — LEVOTHYROXINE SODIUM 75 MCG PO TABS: 75.0000 ug | ORAL_TABLET | Freq: Every day | ORAL | 3 refills | Status: DC

## 2023-01-02 NOTE — Progress Notes (Unsigned)
[facility] [address], [address] [place], [place] [address] [phone]  Date:  01/06/2023   Name:  Brittney Vance   DOB:  1966-06-13   MRN:  [id number]  PCP:  Pearline Cables, MD    Chief Complaint: No chief complaint on file.   History of Present Illness:  Brittney Vance is a 57 y.o. very pleasant female patient who presents with the following:  Patient seen today for concern of leg pain Most recent visit with myself was in January of this year for her physical  History of hypertension, hypothyroidism, breast cancer 2014, depression, sleep apnea She is s/p bilateral mastectomy. Mother of 2 school-aged children- 69 and 15  Pap screening may be due Second dose of [facility] updated in January  Amlodipine, levothyroxine  Lab Results  Component Value Date   TSH 0.89 07/29/2022    Patient Active Problem List   Diagnosis Date Noted   Prediabetes 08/19/2022   Pleural effusion, left 07/20/2021   Malnutrition of moderate degree 07/18/2021   Hypophosphatemia 07/16/2021   Hyponatremia 07/16/2021   Acute necrotizing pancreatitis 07/15/2021   Cholelithiasis 07/15/2021   Goiter 02/06/2015   Fibroid, uterine 10/03/2014   Pain in joint, pelvic region and thigh 09/23/2014   Depression 09/23/2014   Genital herpes 03/15/2014   Hypokalemia 10/03/2013   Anemia associated with chemotherapy 09/17/2013   Neutrophils decreased (HCC) 08/13/2013   Anemia, unspecified 05/21/2013   Malignant neoplasm of upper-outer quadrant of left breast in female, estrogen receptor negative (HCC) 04/26/2013   Cancer of upper-outer quadrant of female breast (HCC) 04/09/2013   Hypothyroidism 01/28/2013   Essential hypertension 01/25/2013   Perimenopause 01/25/2013    Past Medical History:  Diagnosis Date   Breast cancer (HCC) 2015   left, triple negative    Depression 09/23/2014   Diverticulosis    Fibroids    Hiatal hernia     Hypertension    Hypothyroid    Pancreatitis 06/2021   Sleep apnea     Past Surgical History:  Procedure Laterality Date   AXILLARY LYMPH NODE BIOPSY Left 10/30/2013   Procedure: AXILLARY LYMPH NODE BIOPSY;  Surgeon: Adolph Pollack, MD;  Location[facility];  Service: General;  Laterality: Left;   BREAST BIOPSY Left 04/24/2013   BREAST RECONSTRUCTION WITH PLACEMENT OF TISSUE EXPANDER AND FLEX HD (ACELLULAR HYDRATED DERMIS) Bilateral 10/30/2013   Procedure: BILATERAL BREAST RECONSTRUCTION WITH PLACEMENT OF TISSUE EXPANDER AND FLEX HD (ACELLULAR HYDRATED DERMIS);  Surgeon: Etter Sjogren, MD;  Location: [facility] OR;  Service: Plastics;  Laterality: Bilateral;   MASTECTOMY     PORT-A-CATH REMOVAL Right 10/30/2013   Procedure: REMOVAL PORT-A-CATH;  Surgeon: Adolph Pollack, MD;  Location: [facility] OR;  Service: General;  Laterality: Right;   PORTACATH PLACEMENT Right 04/27/2013   Procedure: ULTRASOUND GUIDED PORT INSERTION WITH FLUOROSCOPY;  Surgeon: Adolph Pollack, MD;  Location: [facility] ORS;  Service: General;  Laterality: Right;   TOTAL MASTECTOMY Bilateral 10/30/2013   Procedure: TOTAL MASTECTOMY;  Surgeon: Adolph Pollack, MD;  Location: [facility] OR;  Service: General;  Laterality: Bilateral;   TUBAL LIGATION      Social History   Tobacco Use   Smoking status: Never   Smokeless tobacco: Never  Vaping Use   Vaping Use: Never used  Substance Use Topics   Alcohol use: Not Currently   Drug use: No    Family History  Problem Relation Age of Onset  Hypertension Mother    Diabetes Mother    Alzheimer's disease Mother    Stroke Mother    Hypertension Father    Diabetes Father    Hypertension Brother    Epilepsy Son 4       being worked up for autism   Sleep apnea Son    Thyroid disease Neg Hx     No Known Allergies  Medication list has been reviewed and updated.  Current Outpatient Medications on File Prior to Visit  Medication Sig Dispense Refill   amLODipine (NORVASC) 5 MG tablet Take 1 tablet  (5 mg total) by mouth at bedtime. 90 tablet 3   levothyroxine (SYNTHROID) 75 MCG tablet Take 1 tablet  by mouth daily before breakfast. 90 tablet 3   methocarbamol (ROBAXIN) 500 MG tablet Take 500 mg by mouth 3 (three) times daily as needed for muscle spasms.     No current facility-administered medications on file prior to visit.    Review of Systems:  As per HPI- otherwise negative.   Physical Examination: There were no vitals filed for this visit. There were no vitals filed for this visit. There is no height or weight on file to calculate BMI. Ideal Body Weight:    GEN: no acute distress. HEENT: Atraumatic, Normocephalic.  Ears and Nose: No external deformity. CV: RRR, No M/G/R. No JVD. No thrill. No extra heart sounds. PULM: CTA B, no wheezes, crackles, rhonchi. No retractions. No resp. distress. No accessory muscle use. ABD: S, NT, ND, +BS. No rebound. No HSM. EXTR: No c/c/e PSYCH: Normally interactive. Conversant.    Assessment and Plan: ***  Signed Abbe Amsterdam, MD

## 2023-01-02 NOTE — Patient Instructions (Incomplete)
It was great to see you again today!  

## 2023-01-06 ENCOUNTER — Ambulatory Visit (HOSPITAL_BASED_OUTPATIENT_CLINIC_OR_DEPARTMENT_OTHER)
Admission: RE | Admit: 2023-01-06 | Discharge: 2023-01-06 | Disposition: A | Discharge status: Home / Self Care | Payer: Medicaid Other | Source: Ambulatory Visit | Attending: Family Medicine | Admitting: Family Medicine

## 2023-01-06 ENCOUNTER — Encounter: Payer: Self-pay | Admitting: Family Medicine

## 2023-01-06 ENCOUNTER — Ambulatory Visit (INDEPENDENT_AMBULATORY_CARE_PROVIDER_SITE_OTHER): Payer: Medicaid Other | Admitting: Family Medicine

## 2023-01-06 VITALS — BP 126/84 | HR 67 | Temp 97.7°F | Resp 18 | Ht 63.0 in | Wt 144.8 lb

## 2023-01-06 DIAGNOSIS — M79604 Pain in right leg: Secondary | ICD-10-CM

## 2023-01-06 DIAGNOSIS — B351 Tinea unguium: Secondary | ICD-10-CM

## 2023-01-06 DIAGNOSIS — D696 Thrombocytopenia, unspecified: Secondary | ICD-10-CM | POA: Diagnosis not present

## 2023-01-06 DIAGNOSIS — M79605 Pain in left leg: Secondary | ICD-10-CM | POA: Diagnosis not present

## 2023-01-06 DIAGNOSIS — M545 Low back pain, unspecified: Secondary | ICD-10-CM | POA: Diagnosis not present

## 2023-01-06 DIAGNOSIS — R7309 Other abnormal glucose: Secondary | ICD-10-CM

## 2023-01-06 LAB — COMPREHENSIVE METABOLIC PANEL
ALT: 16 U/L (ref 0–35)
AST: 18 U/L (ref 0–37)
Albumin: 4.3 g/dL (ref 3.5–5.2)
Alkaline Phosphatase: 105 U/L (ref 39–117)
BUN: 12 mg/dL (ref 6–23)
CO2: 30 mEq/L (ref 19–32)
Calcium: 9.2 mg/dL (ref 8.4–10.5)
Chloride: 103 mEq/L (ref 96–112)
Creatinine, Ser: 0.68 mg/dL (ref 0.40–1.20)
GFR: 96.15 mL/min (ref >60.00)
Glucose, Bld: 101 mg/dL — ABNORMAL HIGH (ref 70–99)
Potassium: 3.6 mEq/L (ref 3.5–5.1)
Sodium: 140 mEq/L (ref 135–145)
Total Bilirubin: 0.4 mg/dL (ref 0.2–1.2)
Total Protein: 7.2 g/dL (ref 6.0–8.3)

## 2023-01-06 LAB — CBC
HCT: 39 % (ref 36.0–46.0)
Hemoglobin: 12.9 g/dL (ref 12.0–15.0)
MCHC: 33.2 g/dL (ref 30.0–36.0)
MCV: 91.6 fl (ref 78.0–100.0)
Platelets: 282 10*3/uL (ref 150.0–400.0)
RBC: 4.26 Mil/uL (ref 3.87–5.11)
RDW: 13.1 % (ref 11.5–15.5)
WBC: 4.8 10*3/uL (ref 4.0–10.5)

## 2023-01-06 LAB — HEMOGLOBIN A1C: Hgb A1c MFr Bld: 5.5 % (ref 4.6–6.5)

## 2023-01-06 LAB — FERRITIN: Ferritin: 91.8 ng/mL (ref 10.0–291.0)

## 2023-01-06 LAB — TSH: TSH: 0.59 u[IU]/mL (ref 0.35–5.50)

## 2023-01-06 MED ORDER — TERBINAFINE HCL 250 MG PO TABS
250.0000 mg | ORAL_TABLET | Freq: Every day | ORAL | 0 refills | Status: DC
Start: 2023-01-06 — End: 2023-09-19

## 2023-01-07 ENCOUNTER — Ambulatory Visit (HOSPITAL_COMMUNITY)
Admission: RE | Admit: 2023-01-07 | Discharge: 2023-01-07 | Disposition: A | Discharge status: Home / Self Care | Payer: Medicaid Other | Source: Ambulatory Visit | Attending: Family Medicine | Admitting: Family Medicine

## 2023-01-07 ENCOUNTER — Encounter: Payer: Self-pay | Admitting: Family Medicine

## 2023-01-07 DIAGNOSIS — M79605 Pain in left leg: Secondary | ICD-10-CM | POA: Diagnosis not present

## 2023-01-07 DIAGNOSIS — M79604 Pain in right leg: Secondary | ICD-10-CM

## 2023-01-07 LAB — VAS US ABI WITH/WO TBI
Left ABI: 1.17
Right ABI: 1.18

## 2023-01-10 ENCOUNTER — Encounter (HOSPITAL_COMMUNITY): Payer: Medicaid Other

## 2023-01-10 ENCOUNTER — Encounter: Payer: Self-pay | Admitting: Family Medicine

## 2023-01-11 ENCOUNTER — Encounter: Payer: Self-pay | Admitting: Family Medicine

## 2023-01-20 ENCOUNTER — Encounter (HOSPITAL_BASED_OUTPATIENT_CLINIC_OR_DEPARTMENT_OTHER): Payer: Self-pay | Admitting: Pulmonary Disease

## 2023-01-24 ENCOUNTER — Encounter: Payer: Self-pay | Admitting: Family Medicine

## 2023-01-24 ENCOUNTER — Telehealth (HOSPITAL_BASED_OUTPATIENT_CLINIC_OR_DEPARTMENT_OTHER): Payer: Self-pay | Admitting: Pulmonary Disease

## 2023-01-24 DIAGNOSIS — E038 Other specified hypothyroidism: Secondary | ICD-10-CM

## 2023-01-24 DIAGNOSIS — G4733 Obstructive sleep apnea (adult) (pediatric): Secondary | ICD-10-CM

## 2023-01-24 MED ORDER — LEVOTHYROXINE SODIUM 75 MCG PO TABS
75.0000 ug | ORAL_TABLET | Freq: Every day | ORAL | 0 refills | Status: DC
Start: 2023-01-24 — End: 2023-08-10

## 2023-01-24 NOTE — Telephone Encounter (Signed)
Please see message about pain in legs.   I have sent in 14 day Rx for the pt since we do not supply samples of Synthroid.

## 2023-01-26 NOTE — Telephone Encounter (Signed)
ATC patient. Per DPR, left detailed vm letting patient know the order for her cpap supplies has been placed.   Nothing further needed.

## 2023-02-14 ENCOUNTER — Telehealth: Payer: Self-pay | Admitting: Pulmonary Disease

## 2023-02-15 DIAGNOSIS — G4733 Obstructive sleep apnea (adult) (pediatric): Secondary | ICD-10-CM | POA: Diagnosis not present

## 2023-02-17 NOTE — Telephone Encounter (Signed)
CM from Brad: Patient is not eligible for her mask for 3 more weeks. She was provided everything else in office. I did however send the message for patient follow-up just to check. Patient was notified on the mask resupply date on 02/16/23 @ 856am.   Thank you,  Brad New    ATC pt, no answer and no vm

## 2023-03-18 DIAGNOSIS — G4733 Obstructive sleep apnea (adult) (pediatric): Secondary | ICD-10-CM | POA: Diagnosis not present

## 2023-04-17 NOTE — Progress Notes (Deleted)
[facility] at [facility] [address] [place], [place] [address] [phone]  Date:  04/18/2023   Name:  Brittney Vance   DOB:  1965-10-12   MRN:  [id number]  PCP:  Pearline Cables, MD    Chief Complaint: No chief complaint on file.   History of Present Illness:  Brittney Vance is a 57 y.o. very pleasant female patient who presents with the following:  Patient seen today for a Pap smear Most recent visit with myself was in June of this year History of hypertension, hypothyroidism, breast cancer 2014, depression, sleep apnea She is s/p bilateral mastectomy. Her children are 37 and 41 years old Most recent Pap was in 2018, negative  Patient Active Problem List   Diagnosis Date Noted   Prediabetes 08/19/2022   Pleural effusion, left 07/20/2021   Malnutrition of moderate degree 07/18/2021   Hypophosphatemia 07/16/2021   Hyponatremia 07/16/2021   Acute necrotizing pancreatitis 07/15/2021   Cholelithiasis 07/15/2021   Goiter 02/06/2015   Fibroid, uterine 10/03/2014   Pain in joint, pelvic region and thigh 09/23/2014   Depression 09/23/2014   Genital herpes 03/15/2014   Hypokalemia 10/03/2013   Anemia associated with chemotherapy 09/17/2013   Neutrophils decreased (HCC) 08/13/2013   Anemia, unspecified 05/21/2013   Malignant neoplasm of upper-outer quadrant of left breast in female, estrogen receptor negative (HCC) 04/26/2013   Cancer of upper-outer quadrant of female breast (HCC) 04/09/2013   Hypothyroidism 01/28/2013   Essential hypertension 01/25/2013   Perimenopause 01/25/2013    Past Medical History:  Diagnosis Date   Breast cancer (HCC) 2015   left, triple negative    Depression 09/23/2014   Diverticulosis    Fibroids    Hiatal hernia    Hypertension    Hypothyroid    Pancreatitis 06/2021   Sleep apnea     Past Surgical History:  Procedure Laterality Date   AXILLARY LYMPH NODE BIOPSY Left 10/30/2013    Procedure: AXILLARY LYMPH NODE BIOPSY;  Surgeon: Adolph Pollack, MD;  Location: [facility] OR;  Service: General;  Laterality: Left;   BREAST BIOPSY Left 04/24/2013   BREAST RECONSTRUCTION WITH PLACEMENT OF TISSUE EXPANDER AND FLEX HD (ACELLULAR HYDRATED DERMIS) Bilateral 10/30/2013   Procedure: BILATERAL BREAST RECONSTRUCTION WITH PLACEMENT OF TISSUE EXPANDER AND FLEX HD (ACELLULAR HYDRATED DERMIS);  Surgeon: Etter Sjogren, MD;  Location: [facility] OR;  Service: Plastics;  Laterality: Bilateral;   MASTECTOMY     PORT-A-CATH REMOVAL Right 10/30/2013   Procedure: REMOVAL PORT-A-CATH;  Surgeon: Adolph Pollack, MD;  Location: [facility] OR;  Service: General;  Laterality: Right;   PORTACATH PLACEMENT Right 04/27/2013   Procedure: ULTRASOUND GUIDED PORT INSERTION WITH FLUOROSCOPY;  Surgeon: Adolph Pollack, MD;  Location: [facility] ORS;  Service: General;  Laterality: Right;   TOTAL MASTECTOMY Bilateral 10/30/2013   Procedure: TOTAL MASTECTOMY;  Surgeon: Adolph Pollack, MD;  Location: [facility] OR;  Service: General;  Laterality: Bilateral;   TUBAL LIGATION      Social History   Tobacco Use   Smoking status: Never   Smokeless tobacco: Never  Vaping Use   Vaping status: Never Used  Substance Use Topics   Alcohol use: Not Currently   Drug use: No    Family History  Problem Relation Age of Onset   Hypertension Mother    Diabetes Mother    Alzheimer's disease Mother    Stroke Mother    Hypertension Father    Diabetes Father  Hypertension Brother    Epilepsy Son 4       being worked up for autism   Sleep apnea Son    Thyroid disease Neg Hx     No Known Allergies  Medication list has been reviewed and updated.  Current Outpatient Medications on File Prior to Visit  Medication Sig Dispense Refill   amLODipine (NORVASC) 5 MG tablet Take 1 tablet (5 mg total) by mouth at bedtime. 90 tablet 3   levothyroxine (SYNTHROID) 75 MCG tablet Take 1 tablet  by mouth daily before breakfast. 14 tablet 0   methocarbamol  (ROBAXIN) 500 MG tablet Take 500 mg by mouth 3 (three) times daily as needed for muscle spasms.     terbinafine (LAMISIL) 250 MG tablet Take 1 tablet (250 mg total) by mouth daily. Take for 12 weeks 90 tablet 0   No current facility-administered medications on file prior to visit.    Review of Systems:  As per HPI- otherwise negative.   Physical Examination: There were no vitals filed for this visit. There were no vitals filed for this visit. There is no height or weight on file to calculate BMI. Ideal Body Weight:    GEN: no acute distress. HEENT: Atraumatic, Normocephalic.  Ears and Nose: No external deformity. CV: RRR, No M/G/R. No JVD. No thrill. No extra heart sounds. PULM: CTA B, no wheezes, crackles, rhonchi. No retractions. No resp. distress. No accessory muscle use. ABD: S, NT, ND, +BS. No rebound. No HSM. EXTR: No c/c/e PSYCH: Normally interactive. Conversant.    Assessment and Plan: ***  Signed Abbe Amsterdam, MD

## 2023-04-18 ENCOUNTER — Ambulatory Visit: Payer: Medicaid Other | Admitting: Family Medicine

## 2023-04-18 ENCOUNTER — Telehealth: Payer: Self-pay | Admitting: *Deleted

## 2023-04-18 DIAGNOSIS — Z124 Encounter for screening for malignant neoplasm of cervix: Secondary | ICD-10-CM

## 2023-04-18 NOTE — Telephone Encounter (Signed)
Returned call from 8:58 AM. Left patient a message to call and schedule.

## 2023-04-28 ENCOUNTER — Ambulatory Visit: Payer: Medicaid Other | Admitting: Family Medicine

## 2023-05-09 ENCOUNTER — Encounter: Payer: Medicaid Other | Admitting: Obstetrics & Gynecology

## 2023-05-11 ENCOUNTER — Encounter: Payer: Medicaid Other | Admitting: Obstetrics & Gynecology

## 2023-06-09 ENCOUNTER — Encounter: Payer: Medicaid Other | Admitting: Obstetrics and Gynecology

## 2023-06-27 ENCOUNTER — Encounter: Payer: Self-pay | Admitting: Obstetrics & Gynecology

## 2023-06-27 ENCOUNTER — Encounter: Payer: Medicaid Other | Admitting: Obstetrics & Gynecology

## 2023-08-01 ENCOUNTER — Ambulatory Visit: Payer: Medicaid Other | Admitting: Obstetrics & Gynecology

## 2023-08-01 ENCOUNTER — Encounter: Payer: Self-pay | Admitting: Obstetrics & Gynecology

## 2023-08-01 ENCOUNTER — Other Ambulatory Visit (HOSPITAL_COMMUNITY)
Admission: RE | Admit: 2023-08-01 | Discharge: 2023-08-01 | Disposition: A | Discharge status: Home / Self Care | Payer: Medicaid Other | Source: Ambulatory Visit | Attending: Obstetrics & Gynecology | Admitting: Obstetrics & Gynecology

## 2023-08-01 VITALS — BP 141/78 | HR 87 | Ht 63.0 in | Wt 149.0 lb

## 2023-08-01 DIAGNOSIS — Z01419 Encounter for gynecological examination (general) (routine) without abnormal findings: Secondary | ICD-10-CM | POA: Insufficient documentation

## 2023-08-01 DIAGNOSIS — Z23 Encounter for immunization: Secondary | ICD-10-CM | POA: Diagnosis not present

## 2023-08-01 NOTE — Progress Notes (Signed)
   Subjective:     Brittney Vance is a 58 y.o. female here for a routine exam.  Current complaints: none.     Gynecologic History Patient's last menstrual period was 03/10/2013 (lmp unknown). Contraception: post menopausal status Last Mammogram: mastectomy Last Pap Smear:  02/24/17- negative Last Colon Screening;  2018 Seat Belts:   yes Sun Screen:   yes Dental Check Up:  yes Brush & Floss:  yes   Obstetric History OB History  Gravida Para Term Preterm AB Living  2 2 2   2   SAB IAB Ectopic Multiple Live Births          # Outcome Date GA Lbr Len/2nd Weight Sex Type Anes PTL Lv  2 Term           1 Term              The following portions of the patient's history were reviewed and updated as appropriate: allergies, current medications, past family history, past medical history, past social history, past surgical history, and problem list.  Review of Systems Pertinent items noted in HPI and remainder of comprehensive ROS otherwise negative.    Objective:     Vitals:   08/01/23 1512  BP: (!) 154/90  Pulse: (!) 102  Weight: 149 lb (67.6 kg)  Height: 5' 3 (1.6 m)   Vitals:  WNL General appearance: alert, cooperative and no distress  HEENT: Normocephalic, without obvious abnormality, atraumatic Eyes: negative Throat: lips, mucosa, and tongue normal; teeth and gums normal  Respiratory: Clear to auscultation bilaterally  CV: Regular rate and rhythm  Breasts:  Normal appearance, no masses or tenderness, no nipple retraction or dimpling--s/p bilateral mastectomy  GI: Soft, non-tender; bowel sounds normal; no masses,  no organomegaly  GU: External Genitalia:  Tanner V, no lesion Urethra:  No prolapse   Vagina: Pink, normal rugae, no blood or discharge  Cervix: No CMT, no lesion  Uterus:  Normal size and contour, non tender  Adnexa: Normal, no masses, non tender  Musculoskeletal: No edema, redness or tenderness in the calves or thighs  Skin: No lesions or rash  Lymphatic:  Axillary adenopathy: none     Psychiatric: Normal mood and behavior        Assessment:    Healthy female exam.    Plan:    Pap with cotesting Flu shot Normal breast exam today Colonoscopy up to date PCP to manage other general medicine concerns and HTN.

## 2023-08-02 LAB — CYTOLOGY - PAP
Comment: NEGATIVE
Diagnosis: NEGATIVE
High risk HPV: NEGATIVE

## 2023-08-10 ENCOUNTER — Other Ambulatory Visit: Payer: Self-pay | Admitting: Family Medicine

## 2023-08-10 ENCOUNTER — Encounter: Payer: Self-pay | Admitting: Family Medicine

## 2023-08-10 DIAGNOSIS — E038 Other specified hypothyroidism: Secondary | ICD-10-CM

## 2023-08-10 MED ORDER — LEVOTHYROXINE SODIUM 75 MCG PO TABS: 75.0000 ug | ORAL_TABLET | Freq: Every day | ORAL | 1 refills | Status: DC

## 2023-08-15 DIAGNOSIS — G4733 Obstructive sleep apnea (adult) (pediatric): Secondary | ICD-10-CM | POA: Diagnosis not present

## 2023-08-27 NOTE — Progress Notes (Addendum)
 [facility] at [facility] [address] [place], [place] [address] [phone]  Date:  09/01/2023   Name:  Brittney Vance   DOB:  [birth year]   MRN:  [id number]  PCP:  Watt Harlene BROCKS, MD    Chief Complaint: Annual Exam   History of Present Illness:  Brittney Vance is a 58 y.o. very pleasant female patient who presents with the following:  Patient seen today for physical exam Most recent visit with myself was in June History of hypertension, hypothyroidism, breast cancer 2014, depression, sleep apnea She is s/p bilateral mastectomy. Her oldest daughter is starting college this fall- she is at [employer] COVID-19 booster Tetanus booster is due- update today  She is elected not to do second dose of Shingrix  due to excessive pain with the first dose Flu shot is up-to-date Colon cancer screening up-to-date Pap smear is up-to-date-she was just seen by Dr. Cris last month S/p mastectomy   Amlodipine  5 Levothyroxine  75  Patient Active Problem List   Diagnosis Date Noted   Prediabetes 08/19/2022   Pleural effusion, left 07/20/2021   Malnutrition of moderate degree 07/18/2021   Hypophosphatemia 07/16/2021   Hyponatremia 07/16/2021   Acute necrotizing pancreatitis 07/15/2021   Cholelithiasis 07/15/2021   Goiter 02/06/2015   Fibroid, uterine 10/03/2014   Pain in joint, pelvic region and thigh 09/23/2014   Depression 09/23/2014   Genital herpes 03/15/2014   Hypokalemia 10/03/2013   Anemia associated with chemotherapy 09/17/2013   Neutrophils decreased (HCC) 08/13/2013   Anemia, unspecified 05/21/2013   Malignant neoplasm of upper-outer quadrant of left breast in female, estrogen receptor negative (HCC) 04/26/2013   Cancer of upper-outer quadrant of female breast (HCC) 04/09/2013   Hypothyroidism 01/28/2013   Essential hypertension 01/25/2013   Perimenopause 01/25/2013    Past Medical History:  Diagnosis Date    Breast cancer (HCC) 2015   left, triple negative    Depression 09/23/2014   Diverticulosis    Fibroids    Hiatal hernia    Hypertension    Hypothyroid    Pancreatitis 06/2021   Sleep apnea     Past Surgical History:  Procedure Laterality Date   AXILLARY LYMPH NODE BIOPSY Left 10/30/2013   Procedure: AXILLARY LYMPH NODE BIOPSY;  Surgeon: Krystal JINNY Russell, MD;  Location: [facility] OR;  Service: General;  Laterality: Left;   BREAST BIOPSY Left 04/24/2013   BREAST RECONSTRUCTION WITH PLACEMENT OF TISSUE EXPANDER AND FLEX HD (ACELLULAR HYDRATED DERMIS) Bilateral 10/30/2013   Procedure: BILATERAL BREAST RECONSTRUCTION WITH PLACEMENT OF TISSUE EXPANDER AND FLEX HD (ACELLULAR HYDRATED DERMIS);  Surgeon: Alm Sick, MD;  Location: [facility] OR;  Service: Plastics;  Laterality: Bilateral;   MASTECTOMY     PORT-A-CATH REMOVAL Right 10/30/2013   Procedure: REMOVAL PORT-A-CATH;  Surgeon: Krystal JINNY Russell, MD;  Location: [facility] OR;  Service: General;  Laterality: Right;   PORTACATH PLACEMENT Right 04/27/2013   Procedure: ULTRASOUND GUIDED PORT INSERTION WITH FLUOROSCOPY;  Surgeon: Krystal JINNY Russell, MD;  Location: [facility] ORS;  Service: General;  Laterality: Right;   TOTAL MASTECTOMY Bilateral 10/30/2013   Procedure: TOTAL MASTECTOMY;  Surgeon: Krystal JINNY Russell, MD;  Location: [facility] OR;  Service: General;  Laterality: Bilateral;   TUBAL LIGATION      Social History   Tobacco Use   Smoking status: Never   Smokeless tobacco: Never  Vaping Use   Vaping status: Never Used  Substance Use Topics   Alcohol use:  Not Currently   Drug use: No    Family History  Problem Relation Age of Onset   Hypertension Mother    Diabetes Mother    Alzheimer's disease Mother    Stroke Mother    Hypertension Father    Diabetes Father    Hypertension Brother    Epilepsy Son 4       being worked up for autism   Sleep apnea Son    Thyroid  disease Neg Hx     No Known Allergies  Medication list has been reviewed and  updated.  Current Outpatient Medications on File Prior to Visit  Medication Sig Dispense Refill   levothyroxine  (SYNTHROID ) 75 MCG tablet Take 1 tablet  by mouth daily before breakfast. 90 tablet 1   methocarbamol  (ROBAXIN ) 500 MG tablet Take 500 mg by mouth 3 (three) times daily as needed for muscle spasms.     terbinafine  (LAMISIL ) 250 MG tablet Take 1 tablet (250 mg total) by mouth daily. Take for 12 weeks (Patient not taking: Reported on 09/01/2023) 90 tablet 0   No current facility-administered medications on file prior to visit.    Review of Systems:  As per HPI- otherwise negative.   Physical Examination: Vitals:   09/01/23 1047  BP: 120/78  Pulse: 79  SpO2: 100%   Vitals:   09/01/23 1047  Weight: 142 lb (64.4 kg)  Height: 5' 3 (1.6 m)   Body mass index is 25.15 kg/m. Ideal Body Weight: Weight in (lb) to have BMI = 25: 140.8  GEN: no acute distress. Normal weight, looks well  HEENT: Atraumatic, Normocephalic.  Bilateral TM wnl, oropharynx normal.  PEERL,EOMI.   Ears and Nose: No external deformity. CV: RRR, No M/G/R. No JVD. No thrill. No extra heart sounds. PULM: CTA B, no wheezes, crackles, rhonchi. No retractions. No resp. distress. No accessory muscle use. ABD: S, NT, ND, +BS. No rebound. No HSM. EXTR: No c/c/e PSYCH: Normally interactive. Conversant.    Assessment and Plan: Physical exam  Other specified hypothyroidism - Plan: TSH  Elevated glucose - Plan: Comprehensive metabolic panel, Hemoglobin A1c  Screening, lipid - Plan: Lipid panel  Essential hypertension - Plan: amLODipine  (NORVASC ) 5 MG tablet, CBC  Screening for diabetes mellitus  Immunization due - Plan: Td vaccine greater than or equal to 7yo preservative free IM  Upper back pain - Plan: Ambulatory referral to Physical Therapy, CANCELED: Ambulatory referral to Physical Therapy  Physical exam today.  Encouraged healthy diet and exercise routine Will plan further follow- up pending  labs. Referral to PT to work on her chronic upper back pain Tetanus booster BP well controlled   Signed Harlene Schroeder, MD  Received labs as below, message to patient Results for orders placed or performed in visit on 09/01/23  CBC   Collection Time: 09/01/23 11:12 AM  Result Value Ref Range   WBC 4.6 4.0 - 10.5 K/uL   RBC 4.25 3.87 - 5.11 Mil/uL   Platelets 259.0 150.0 - 400.0 K/uL   Hemoglobin 13.1 12.0 - 15.0 g/dL   HCT 61.1 63.9 - 53.9 %   MCV 91.2 78.0 - 100.0 fl   MCHC 33.7 30.0 - 36.0 g/dL   RDW 86.7 88.4 - 84.4 %  Comprehensive metabolic panel   Collection Time: 09/01/23 11:12 AM  Result Value Ref Range   Sodium 141 135 - 145 mEq/L   Potassium 3.2 (L) 3.5 - 5.1 mEq/L   Chloride 102 96 - 112 mEq/L   CO2 29 19 -  32 mEq/L   Glucose, Bld 86 70 - 99 mg/dL   BUN 12 6 - 23 mg/dL   Creatinine, Ser 9.32 0.40 - 1.20 mg/dL   Total Bilirubin 0.4 0.2 - 1.2 mg/dL   Alkaline Phosphatase 101 39 - 117 U/L   AST 18 0 - 37 U/L   ALT 16 0 - 35 U/L   Total Protein 7.4 6.0 - 8.3 g/dL   Albumin 4.5 3.5 - 5.2 g/dL   GFR 03.94 >39.99 mL/min   Calcium 9.5 8.4 - 10.5 mg/dL  Hemoglobin J8r   Collection Time: 09/01/23 11:12 AM  Result Value Ref Range   Hgb A1c MFr Bld 5.7 4.6 - 6.5 %  Lipid panel   Collection Time: 09/01/23 11:12 AM  Result Value Ref Range   Cholesterol 219 (H) 0 - 200 mg/dL   Triglycerides 30.9 0.0 - 149.0 mg/dL   HDL 28.59 >60.99 mg/dL   VLDL 86.1 0.0 - 59.9 mg/dL   LDL Cholesterol 865 (H) 0 - 99 mg/dL   Total CHOL/HDL Ratio 3    NonHDL 147.62   TSH   Collection Time: 09/01/23 11:12 AM  Result Value Ref Range   TSH 0.36 0.35 - 5.50 uIU/mL

## 2023-08-27 NOTE — Patient Instructions (Addendum)
 It was good to see you again today, I will be in touch with your labs as soon as possible Tetanus booster today

## 2023-09-01 ENCOUNTER — Encounter: Payer: Self-pay | Admitting: Family Medicine

## 2023-09-01 ENCOUNTER — Ambulatory Visit: Payer: Medicaid Other | Admitting: Family Medicine

## 2023-09-01 VITALS — BP 120/78 | HR 79 | Ht 63.0 in | Wt 142.0 lb

## 2023-09-01 DIAGNOSIS — R7309 Other abnormal glucose: Secondary | ICD-10-CM | POA: Diagnosis not present

## 2023-09-01 DIAGNOSIS — Z0000 Encounter for general adult medical examination without abnormal findings: Secondary | ICD-10-CM

## 2023-09-01 DIAGNOSIS — M549 Dorsalgia, unspecified: Secondary | ICD-10-CM | POA: Diagnosis not present

## 2023-09-01 DIAGNOSIS — E038 Other specified hypothyroidism: Secondary | ICD-10-CM | POA: Diagnosis not present

## 2023-09-01 DIAGNOSIS — Z131 Encounter for screening for diabetes mellitus: Secondary | ICD-10-CM | POA: Diagnosis not present

## 2023-09-01 DIAGNOSIS — Z13220 Encounter for screening for lipoid disorders: Secondary | ICD-10-CM | POA: Diagnosis not present

## 2023-09-01 DIAGNOSIS — Z23 Encounter for immunization: Secondary | ICD-10-CM | POA: Diagnosis not present

## 2023-09-01 DIAGNOSIS — I10 Essential (primary) hypertension: Secondary | ICD-10-CM | POA: Diagnosis not present

## 2023-09-01 DIAGNOSIS — E876 Hypokalemia: Secondary | ICD-10-CM

## 2023-09-01 LAB — CBC
HCT: 38.8 % (ref 36.0–46.0)
Hemoglobin: 13.1 g/dL (ref 12.0–15.0)
MCHC: 33.7 g/dL (ref 30.0–36.0)
MCV: 91.2 fL (ref 78.0–100.0)
Platelets: 259 10*3/uL (ref 150.0–400.0)
RBC: 4.25 Mil/uL (ref 3.87–5.11)
RDW: 13.2 % (ref 11.5–15.5)
WBC: 4.6 10*3/uL (ref 4.0–10.5)

## 2023-09-01 LAB — LIPID PANEL
Cholesterol: 219 mg/dL — ABNORMAL HIGH (ref 0–200)
HDL: 71.4 mg/dL (ref >39.00)
LDL Cholesterol: 134 mg/dL — ABNORMAL HIGH (ref 0–99)
NonHDL: 147.62
Total CHOL/HDL Ratio: 3
Triglycerides: 69 mg/dL (ref 0.0–149.0)
VLDL: 13.8 mg/dL (ref 0.0–40.0)

## 2023-09-01 LAB — COMPREHENSIVE METABOLIC PANEL
ALT: 16 U/L (ref 0–35)
AST: 18 U/L (ref 0–37)
Albumin: 4.5 g/dL (ref 3.5–5.2)
Alkaline Phosphatase: 101 U/L (ref 39–117)
BUN: 12 mg/dL (ref 6–23)
CO2: 29 meq/L (ref 19–32)
Calcium: 9.5 mg/dL (ref 8.4–10.5)
Chloride: 102 meq/L (ref 96–112)
Creatinine, Ser: 0.67 mg/dL (ref 0.40–1.20)
GFR: 96.05 mL/min (ref >60.00)
Glucose, Bld: 86 mg/dL (ref 70–99)
Potassium: 3.2 meq/L — ABNORMAL LOW (ref 3.5–5.1)
Sodium: 141 meq/L (ref 135–145)
Total Bilirubin: 0.4 mg/dL (ref 0.2–1.2)
Total Protein: 7.4 g/dL (ref 6.0–8.3)

## 2023-09-01 LAB — HEMOGLOBIN A1C: Hgb A1c MFr Bld: 5.7 % (ref 4.6–6.5)

## 2023-09-01 LAB — TSH: TSH: 0.36 u[IU]/mL (ref 0.35–5.50)

## 2023-09-01 MED ORDER — AMLODIPINE BESYLATE 5 MG PO TABS
5.0000 mg | ORAL_TABLET | Freq: Every day | ORAL | 3 refills | Status: AC
Start: 2023-09-01 — End: 2024-08-26

## 2023-09-01 NOTE — Addendum Note (Signed)
 Addended by: Gates Kasal C on: 09/01/2023 07:50 PM   Modules accepted: Orders

## 2023-09-02 DIAGNOSIS — M546 Pain in thoracic spine: Secondary | ICD-10-CM | POA: Diagnosis not present

## 2023-09-02 DIAGNOSIS — M545 Low back pain, unspecified: Secondary | ICD-10-CM | POA: Diagnosis not present

## 2023-09-15 MED ORDER — LEVOTHYROXINE SODIUM 75 MCG PO TABS: 75.0000 ug | ORAL_TABLET | Freq: Every day | ORAL | 0 refills | Status: DC

## 2023-09-19 ENCOUNTER — Ambulatory Visit (INDEPENDENT_AMBULATORY_CARE_PROVIDER_SITE_OTHER): Payer: Medicaid Other | Admitting: Internal Medicine

## 2023-09-19 ENCOUNTER — Ambulatory Visit: Payer: Self-pay | Admitting: Family Medicine

## 2023-09-19 VITALS — BP 133/79 | HR 89 | Temp 99.3°F | Resp 16 | Ht 63.0 in | Wt 148.0 lb

## 2023-09-19 DIAGNOSIS — R21 Rash and other nonspecific skin eruption: Secondary | ICD-10-CM | POA: Diagnosis not present

## 2023-09-19 DIAGNOSIS — B081 Molluscum contagiosum: Secondary | ICD-10-CM | POA: Diagnosis not present

## 2023-09-19 MED ORDER — DOXYCYCLINE HYCLATE 100 MG PO TABS: 100.0000 mg | ORAL_TABLET | Freq: Two times a day (BID) | ORAL | 0 refills | Status: AC

## 2023-09-19 MED ORDER — PREDNISONE 10 MG PO TABS: ORAL_TABLET | ORAL | 0 refills | Status: AC

## 2023-09-19 NOTE — Telephone Encounter (Signed)
  Chief Complaint: red- wide spread- bilateral arms, torso, thighs Symptoms: red clusters of bumps- itching Frequency: stated last week Pertinent Negatives: Patient denies fever,pain Disposition: [] ED /[] Urgent Care (no appt availability in office) / [x] Appointment(In office/virtual)/ []  [facility]/ [] Home Care/ [] Refused Recommended Disposition /[] [facility]/ []  Follow-up with PCP Additional Notes: Patient has rash that is spreading and not getting better- she has been using cortisone cream with limited relief  Copied from CRM [id number]. Topic: Clinical - Red Word Triage >> Sep 19, 2023  8:44 AM Irine Seal wrote: [facility] that prompted transfer to Nurse Triage: present for a week, patient has red rash spreading on arms and legs, OTC cortisone cream is helping with the itching, but not the rash, patient is not sure what caused it. Reason for Disposition  Mild widespread rash  (Exception: Heat rash lasting 3 days or less.)  Answer Assessment - Initial Assessment Questions 1. APPEARANCE of RASH: "Describe the rash." (e.g., spots, blisters, raised areas, skin peeling, scaly)     Red, under arms- bumps- clusters, inner crease of arm- red patches  2. SIZE: "How big are the spots?" (e.g., tip of pen, eraser, coin; inches, centimeters)     Multiple bumps- few in crease, arm pits- huge cluster, thigh area - not as large 3. LOCATION: "Where is the rash located?"     Rash on arms and legs- bilateral 4. COLOR: "What color is the rash?" (Note: It is difficult to assess rash color in people with darker-colored skin. When this situation occurs, simply ask the caller to describe what they see.)     red 5. ONSET: "When did the rash begin?"     1 week ago- possibly Wednesday 6. FEVER: "Do you have a fever?" If Yes, ask: "What is your temperature, how was it measured, and when did it start?"     *No Answer* 7. ITCHING: "Does the rash itch?" If Yes, ask: "How bad is the itch?" (Scale  1-10; or mild, moderate, severe)     Yes- 3-4/10 8. CAUSE: "What do you think is causing the rash?"     unsure 9. MEDICINE FACTORS: "Have you started any new medicines within the last 2 weeks?" (e.g., antibiotics)      No- changes in manufactures of medication 10. OTHER SYMPTOMS: "Do you have any other symptoms?" (e.g., dizziness, headache, sore throat, joint pain)       no  Protocols used: Rash or Redness - [facility]

## 2023-09-19 NOTE — Progress Notes (Signed)
 Subjective:    Patient ID: Brittney Vance, female    DOB: 1966/02/12, 58 y.o.   MRN: [id number]  DOS:  09/19/2023 Type of visit - description: Acute  Rash started approximately 7 days ago. Is located at the armpits, groins, minimal itching. Denies any fever chills or systemic symptoms. Started to use new lotion about 4 weeks ago that she uses all over but no other new exposures.   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, triple negative    Depression 09/23/2014   Diverticulosis    Fibroids    Hiatal hernia    Hypertension    Hypothyroid    Pancreatitis 06/2021   Sleep apnea     Past Surgical History:  Procedure Laterality Date   AXILLARY LYMPH NODE BIOPSY Left 10/30/2013   Procedure: AXILLARY LYMPH NODE BIOPSY;  Surgeon: Adolph Pollack, MD;  Location: [facility] OR;  Service: General;  Laterality: Left;   BREAST BIOPSY Left 04/24/2013   BREAST RECONSTRUCTION WITH PLACEMENT OF TISSUE EXPANDER AND FLEX HD (ACELLULAR HYDRATED DERMIS) Bilateral 10/30/2013   Procedure: BILATERAL BREAST RECONSTRUCTION WITH PLACEMENT OF TISSUE EXPANDER AND FLEX HD (ACELLULAR HYDRATED DERMIS);  Surgeon: Etter Sjogren, MD;  Location: [facility] OR;  Service: Plastics;  Laterality: Bilateral;   MASTECTOMY     PORT-A-CATH REMOVAL Right 10/30/2013   Procedure: REMOVAL PORT-A-CATH;  Surgeon: Adolph Pollack, MD;  Location: [facility] OR;  Service: General;  Laterality: Right;   PORTACATH PLACEMENT Right 04/27/2013   Procedure: ULTRASOUND GUIDED PORT INSERTION WITH FLUOROSCOPY;  Surgeon: Adolph Pollack, MD;  Location: [facility] ORS;  Service: General;  Laterality: Right;   TOTAL MASTECTOMY Bilateral 10/30/2013   Procedure: TOTAL MASTECTOMY;  Surgeon: Adolph Pollack, MD;  Location: [facility] OR;  Service: General;  Laterality: Bilateral;   TUBAL LIGATION      Current Outpatient Medications  Medication Instructions   amLODipine (NORVASC) 5 mg, Oral, Daily at bedtime    levothyroxine (SYNTHROID) 75 MCG tablet Take 1 tablet  by mouth daily before breakfast.   methocarbamol (ROBAXIN) 500 mg, 3 times daily PRN       Objective:   Physical Exam Skin:         Comments: Rash distribution: See graphic Has multiple papules, 2 to 3 mm in size, some with a dimple in the center but many are flat, they are surrounded by erythema.  No blisters per se   BP 133/79 (BP Location: Right Arm, Patient Position: Sitting, Cuff Size: Normal)   Pulse 89   Temp 99.3 F (37.4 C) (Oral)   Resp 16   Ht 5\' 3"  (1.6 m)   Wt 148 lb (67.1 kg)   LMP 03/10/2013 (LMP Unknown)   SpO2 100%   BMI 26.22 kg/m  General:   Well developed, NAD, BMI noted. HEENT:  Normocephalic . Face symmetric, atraumatic Skin: See picture and graphic  neurologic:  alert & oriented X3.  Speech normal, gait appropriate for age and unassisted Psych--  Cognition and judgment appear intact.  Cooperative with normal attention span and concentration.  Behavior appropriate. No anxious or depressed appearing.      Assessment     58 year old female, history of HTN, thyroid disease, breast cancer Dx 2015, resents with:  Rash: Started a week ago, no systemic symptoms, no obvious exposures.  Slightly pruritic. DDx folliculitis, eczema, molluscum contagiosum?. Plan: Doxycycline, oral prednisone, continue with topical Hydrocortisone.  The patient  really like to get to the bottom of this and get a  diagnosis, will arrange a referral to dermatology.

## 2023-09-19 NOTE — Patient Instructions (Signed)
 Take an antibiotic called doxycycline for 1 week  Take prednisone, anti-inflammatory medication for few days  Okay to apply the over-the-counter hydrocortisone as you are doing  If you are not gradually better in the next 2 weeks please call us.  Folliculitis Eczema

## 2023-09-19 NOTE — Telephone Encounter (Signed)
 Noted. Patient is scheduled for today with Dr. Willow Ora at [facility].

## 2023-09-22 NOTE — Progress Notes (Deleted)
 [facility] at [facility] [address] [place], [place] [address] [phone]  Date:  09/28/2023   Name:  Brittney Vance   DOB:  [birth year]   MRN:  [id number]  PCP:  Pearline Cables, MD    Chief Complaint: No chief complaint on file.   History of Present Illness:  Brittney Vance is a 58 y.o. very pleasant female patient who presents with the following:  Pt seen today with concern of a rash Most recent visit with myself was in February for her CPE History of hypertension, hypothyroidism, breast cancer 2014, depression, sleep apnea She is s/p bilateral mastectomy. She then saw my partner DR Drue Novel last week with this rash: Rash: Started a week ago, no systemic symptoms, no obvious exposures.  Slightly pruritic. DDx folliculitis, eczema, molluscum contagiosum?. Plan: Doxycycline, oral prednisone, continue with topical Hydrocortisone.  The patient really like to get to the bottom of this and get a  diagnosis, will arrange a referral to dermatology.   Patient Active Problem List   Diagnosis Date Noted   Prediabetes 08/19/2022   Pleural effusion, left 07/20/2021   Malnutrition of moderate degree 07/18/2021   Hypophosphatemia 07/16/2021   Hyponatremia 07/16/2021   Acute necrotizing pancreatitis 07/15/2021   Cholelithiasis 07/15/2021   Goiter 02/06/2015   Fibroid, uterine 10/03/2014   Pain in joint, pelvic region and thigh 09/23/2014   Depression 09/23/2014   Genital herpes 03/15/2014   Hypokalemia 10/03/2013   Anemia associated with chemotherapy 09/17/2013   Neutrophils decreased (HCC) 08/13/2013   Anemia, unspecified 05/21/2013   Malignant neoplasm of upper-outer quadrant of left breast in female, estrogen receptor negative (HCC) 04/26/2013   Cancer of upper-outer quadrant of female breast (HCC) 04/09/2013   Hypothyroidism 01/28/2013   Essential hypertension 01/25/2013   Perimenopause 01/25/2013    Past Medical History:   Diagnosis Date   Breast cancer (HCC) 2015   left, triple negative    Depression 09/23/2014   Diverticulosis    Fibroids    Hiatal hernia    Hypertension    Hypothyroid    Pancreatitis 06/2021   Sleep apnea     Past Surgical History:  Procedure Laterality Date   AXILLARY LYMPH NODE BIOPSY Left 10/30/2013   Procedure: AXILLARY LYMPH NODE BIOPSY;  Surgeon: Adolph Pollack, MD;  Location: [facility] OR;  Service: General;  Laterality: Left;   BREAST BIOPSY Left 04/24/2013   BREAST RECONSTRUCTION WITH PLACEMENT OF TISSUE EXPANDER AND FLEX HD (ACELLULAR HYDRATED DERMIS) Bilateral 10/30/2013   Procedure: BILATERAL BREAST RECONSTRUCTION WITH PLACEMENT OF TISSUE EXPANDER AND FLEX HD (ACELLULAR HYDRATED DERMIS);  Surgeon: Etter Sjogren, MD;  Location: [facility] OR;  Service: Plastics;  Laterality: Bilateral;   MASTECTOMY     PORT-A-CATH REMOVAL Right 10/30/2013   Procedure: REMOVAL PORT-A-CATH;  Surgeon: Adolph Pollack, MD;  Location: [facility] OR;  Service: General;  Laterality: Right;   PORTACATH PLACEMENT Right 04/27/2013   Procedure: ULTRASOUND GUIDED PORT INSERTION WITH FLUOROSCOPY;  Surgeon: Adolph Pollack, MD;  Location: [facility] ORS;  Service: General;  Laterality: Right;   TOTAL MASTECTOMY Bilateral 10/30/2013   Procedure: TOTAL MASTECTOMY;  Surgeon: Adolph Pollack, MD;  Location: [facility] OR;  Service: General;  Laterality: Bilateral;   TUBAL LIGATION      Social History   Tobacco Use   Smoking status: Never   Smokeless tobacco: Never  Vaping Use   Vaping status: Never Used  Substance Use Topics   Alcohol  use: Not Currently   Drug use: No    Family History  Problem Relation Age of Onset   Hypertension Mother    Diabetes Mother    Alzheimer's disease Mother    Stroke Mother    Hypertension Father    Diabetes Father    Hypertension Brother    Epilepsy Son 4       being worked up for autism   Sleep apnea Son    Thyroid disease Neg Hx     No Known Allergies  Medication list has been reviewed  and updated.  Current Outpatient Medications on File Prior to Visit  Medication Sig Dispense Refill   amLODipine (NORVASC) 5 MG tablet Take 1 tablet (5 mg total) by mouth at bedtime. 90 tablet 3   doxycycline (VIBRA-TABS) 100 MG tablet Take 1 tablet (100 mg total) by mouth 2 (two) times daily. 14 tablet 0   levothyroxine (SYNTHROID) 75 MCG tablet Take 1 tablet  by mouth daily before breakfast. 14 tablet 0   methocarbamol (ROBAXIN) 500 MG tablet Take 500 mg by mouth 3 (three) times daily as needed for muscle spasms.     predniSONE (DELTASONE) 10 MG tablet 4 tablets x 2 days, 3 tabs x 2 days, 2 tabs x 2 days, 1 tab x 2 days 20 tablet 0   No current facility-administered medications on file prior to visit.    Review of Systems:  As per HPI- otherwise negative.   Physical Examination: There were no vitals filed for this visit. There were no vitals filed for this visit. There is no height or weight on file to calculate BMI. Ideal Body Weight:    GEN: no acute distress. HEENT: Atraumatic, Normocephalic.  Ears and Nose: No external deformity. CV: RRR, No M/G/R. No JVD. No thrill. No extra heart sounds. PULM: CTA B, no wheezes, crackles, rhonchi. No retractions. No resp. distress. No accessory muscle use. ABD: S, NT, ND, +BS. No rebound. No HSM. EXTR: No c/c/e PSYCH: Normally interactive. Conversant.    Assessment and Plan: ***  Signed Abbe Amsterdam, MD

## 2023-09-23 DIAGNOSIS — G4733 Obstructive sleep apnea (adult) (pediatric): Secondary | ICD-10-CM | POA: Diagnosis not present

## 2023-09-27 DIAGNOSIS — M545 Low back pain, unspecified: Secondary | ICD-10-CM | POA: Diagnosis not present

## 2023-09-27 DIAGNOSIS — M6281 Muscle weakness (generalized): Secondary | ICD-10-CM | POA: Diagnosis not present

## 2023-09-27 DIAGNOSIS — M2560 Stiffness of unspecified joint, not elsewhere classified: Secondary | ICD-10-CM | POA: Diagnosis not present

## 2023-09-28 ENCOUNTER — Ambulatory Visit: Payer: Medicaid Other | Admitting: Family Medicine

## 2023-09-30 DIAGNOSIS — G4733 Obstructive sleep apnea (adult) (pediatric): Secondary | ICD-10-CM | POA: Diagnosis not present

## 2023-10-05 DIAGNOSIS — M2560 Stiffness of unspecified joint, not elsewhere classified: Secondary | ICD-10-CM | POA: Diagnosis not present

## 2023-10-05 DIAGNOSIS — M6281 Muscle weakness (generalized): Secondary | ICD-10-CM | POA: Diagnosis not present

## 2023-10-05 DIAGNOSIS — M545 Low back pain, unspecified: Secondary | ICD-10-CM | POA: Diagnosis not present

## 2023-10-14 DIAGNOSIS — M2560 Stiffness of unspecified joint, not elsewhere classified: Secondary | ICD-10-CM | POA: Diagnosis not present

## 2023-10-14 DIAGNOSIS — M6281 Muscle weakness (generalized): Secondary | ICD-10-CM | POA: Diagnosis not present

## 2023-10-14 DIAGNOSIS — M545 Low back pain, unspecified: Secondary | ICD-10-CM | POA: Diagnosis not present

## 2023-10-19 DIAGNOSIS — M6281 Muscle weakness (generalized): Secondary | ICD-10-CM | POA: Diagnosis not present

## 2023-10-19 DIAGNOSIS — M545 Low back pain, unspecified: Secondary | ICD-10-CM | POA: Diagnosis not present

## 2023-10-19 DIAGNOSIS — M2560 Stiffness of unspecified joint, not elsewhere classified: Secondary | ICD-10-CM | POA: Diagnosis not present

## 2023-10-28 DIAGNOSIS — M2560 Stiffness of unspecified joint, not elsewhere classified: Secondary | ICD-10-CM | POA: Diagnosis not present

## 2023-10-28 DIAGNOSIS — M6281 Muscle weakness (generalized): Secondary | ICD-10-CM | POA: Diagnosis not present

## 2023-10-28 DIAGNOSIS — M545 Low back pain, unspecified: Secondary | ICD-10-CM | POA: Diagnosis not present

## 2023-11-04 DIAGNOSIS — M2560 Stiffness of unspecified joint, not elsewhere classified: Secondary | ICD-10-CM | POA: Diagnosis not present

## 2023-11-04 DIAGNOSIS — M545 Low back pain, unspecified: Secondary | ICD-10-CM | POA: Diagnosis not present

## 2023-11-04 DIAGNOSIS — M6281 Muscle weakness (generalized): Secondary | ICD-10-CM | POA: Diagnosis not present

## 2023-11-06 ENCOUNTER — Encounter: Payer: Self-pay | Admitting: Family Medicine

## 2023-12-22 ENCOUNTER — Other Ambulatory Visit: Payer: Self-pay | Admitting: Family Medicine

## 2023-12-22 DIAGNOSIS — E038 Other specified hypothyroidism: Secondary | ICD-10-CM

## 2023-12-31 DIAGNOSIS — G4733 Obstructive sleep apnea (adult) (pediatric): Secondary | ICD-10-CM | POA: Diagnosis not present

## 2024-01-05 DIAGNOSIS — M542 Cervicalgia: Secondary | ICD-10-CM | POA: Diagnosis not present

## 2024-01-05 DIAGNOSIS — G8929 Other chronic pain: Secondary | ICD-10-CM | POA: Diagnosis not present

## 2024-01-05 DIAGNOSIS — M545 Low back pain, unspecified: Secondary | ICD-10-CM | POA: Diagnosis not present

## 2024-01-05 DIAGNOSIS — G4733 Obstructive sleep apnea (adult) (pediatric): Secondary | ICD-10-CM | POA: Diagnosis not present

## 2024-01-30 DIAGNOSIS — G4733 Obstructive sleep apnea (adult) (pediatric): Secondary | ICD-10-CM | POA: Diagnosis not present

## 2024-02-03 ENCOUNTER — Other Ambulatory Visit: Payer: Self-pay | Admitting: Family Medicine

## 2024-02-03 DIAGNOSIS — E038 Other specified hypothyroidism: Secondary | ICD-10-CM

## 2024-02-03 MED ORDER — LEVOTHYROXINE SODIUM 75 MCG PO TABS
75.0000 ug | ORAL_TABLET | Freq: Every day | ORAL | 2 refills | Status: AC
Start: 2024-02-03 — End: ?

## 2024-02-03 NOTE — Telephone Encounter (Signed)
 Copied from CRM [id number]. Topic: Clinical - Medication Refill >> Feb 03, 2024  1:28 PM Adrionna Y wrote: Medication: levothyroxine  (SYNTHROID ) 75 MCG tablet  : Pt called in for a 2 week supply for her levothyroxine  (SYNTHROID ) 75 MCG tablet since the mail in pharmacy is delayed in the shipping on this.  Has the patient contacted their pharmacy? Yes (Agent: If no, request that the patient contact the pharmacy for the refill. If patient does not wish to contact the pharmacy document the reason why and proceed with request.) (Agent: If yes, when and what did the pharmacy advise?)  This is the patient's preferred pharmacy:   [facility] [address], [place] - [address] [place] [address] Phone: [phone] Fax: [phone]  Is this the correct pharmacy for this prescription? Yes If no, delete pharmacy and type the correct one.   Has the prescription been filled recently? No  Is the patient out of the medication? No  Has the patient been seen for an appointment in the last year OR does the patient have an upcoming appointment? Yes  Can we respond through MyChart? Yes  Agent: Please be advised that Rx refills may take up to 3 business days. We ask that you follow-up with your pharmacy.

## 2024-03-01 DIAGNOSIS — G4733 Obstructive sleep apnea (adult) (pediatric): Secondary | ICD-10-CM | POA: Diagnosis not present

## 2024-03-23 DIAGNOSIS — G4733 Obstructive sleep apnea (adult) (pediatric): Secondary | ICD-10-CM | POA: Diagnosis not present

## 2024-04-01 DIAGNOSIS — G4733 Obstructive sleep apnea (adult) (pediatric): Secondary | ICD-10-CM | POA: Diagnosis not present

## 2024-04-26 ENCOUNTER — Encounter (HOSPITAL_BASED_OUTPATIENT_CLINIC_OR_DEPARTMENT_OTHER): Payer: Self-pay

## 2024-04-26 ENCOUNTER — Other Ambulatory Visit: Payer: Self-pay

## 2024-04-26 ENCOUNTER — Emergency Department (HOSPITAL_BASED_OUTPATIENT_CLINIC_OR_DEPARTMENT_OTHER)

## 2024-04-26 ENCOUNTER — Emergency Department (HOSPITAL_BASED_OUTPATIENT_CLINIC_OR_DEPARTMENT_OTHER)
Admission: EM | Admit: 2024-04-26 | Discharge: 2024-04-26 | Disposition: A | Discharge status: Home / Self Care | Attending: Emergency Medicine | Admitting: Emergency Medicine

## 2024-04-26 DIAGNOSIS — M25561 Pain in right knee: Secondary | ICD-10-CM | POA: Diagnosis not present

## 2024-04-26 DIAGNOSIS — M542 Cervicalgia: Secondary | ICD-10-CM | POA: Insufficient documentation

## 2024-04-26 DIAGNOSIS — S0993XA Unspecified injury of face, initial encounter: Secondary | ICD-10-CM | POA: Diagnosis not present

## 2024-04-26 DIAGNOSIS — S01511A Laceration without foreign body of lip, initial encounter: Secondary | ICD-10-CM | POA: Diagnosis not present

## 2024-04-26 DIAGNOSIS — Z79899 Other long term (current) drug therapy: Secondary | ICD-10-CM | POA: Insufficient documentation

## 2024-04-26 DIAGNOSIS — S0083XA Contusion of other part of head, initial encounter: Secondary | ICD-10-CM

## 2024-04-26 DIAGNOSIS — I10 Essential (primary) hypertension: Secondary | ICD-10-CM | POA: Insufficient documentation

## 2024-04-26 DIAGNOSIS — Z853 Personal history of malignant neoplasm of breast: Secondary | ICD-10-CM | POA: Diagnosis not present

## 2024-04-26 DIAGNOSIS — S0990XA Unspecified injury of head, initial encounter: Secondary | ICD-10-CM | POA: Insufficient documentation

## 2024-04-26 DIAGNOSIS — S00531A Contusion of lip, initial encounter: Secondary | ICD-10-CM | POA: Diagnosis not present

## 2024-04-26 DIAGNOSIS — E039 Hypothyroidism, unspecified: Secondary | ICD-10-CM | POA: Insufficient documentation

## 2024-04-26 DIAGNOSIS — W01198A Fall on same level from slipping, tripping and stumbling with subsequent striking against other object, initial encounter: Secondary | ICD-10-CM | POA: Diagnosis not present

## 2024-04-26 DIAGNOSIS — M25531 Pain in right wrist: Secondary | ICD-10-CM | POA: Insufficient documentation

## 2024-04-26 DIAGNOSIS — S199XXA Unspecified injury of neck, initial encounter: Secondary | ICD-10-CM | POA: Diagnosis not present

## 2024-04-26 DIAGNOSIS — W19XXXA Unspecified fall, initial encounter: Secondary | ICD-10-CM

## 2024-04-26 NOTE — ED Provider Notes (Signed)
 [place] EMERGENCY DEPARTMENT AT [facility] Provider Note   CSN: [id number] Arrival date & time: 04/26/24  2022     Patient presents with: Fall and Facial Injury   Brittney Vance is a 58 y.o. female.   Patient brought in by husband.  Patient tripped and fell out side.  Striking her face.  Possible loss of conscious but not for very long.  Patient not on any blood thinners.  Patient with an abrasion to her nose swelling to the upper lip and feels as if her upper teeth are loose.  Also with a complaint of some neck pain.  I had a little bit discomfort to her right knee.  But clinically seems to be fine.  Past medical history significant for fibroids hypertension hypothyroidism breast cancer sleep apnea pancreatitis diverticulosis hiatal hernia.  Past surgical history significant for tubal ligation total mastectomy.  Patient is never used tobacco products.  Patient also with complaint of a little bit of right wrist pain and right knee pain.  But she feels that those are fine she is get good range of motion.  No significant swelling.  Patient states that her tetanus is up-to-date.  Harlene Perfect is her primary care doctor so the most likely that is up-to-date.       Prior to Admission medications   Medication Sig Start Date End Date Taking? Authorizing Provider  amLODipine  (NORVASC ) 5 MG tablet Take 1 tablet (5 mg total) by mouth at bedtime. 09/01/23 08/26/24  Copland, Harlene BROCKS, MD  doxycycline  (VIBRA -TABS) 100 MG tablet Take 1 tablet (100 mg total) by mouth 2 (two) times daily. 09/19/23   Paz, Jose E, MD  levothyroxine  (SYNTHROID ) 75 MCG tablet Take 1 tablet (75 mcg total) by mouth daily before breakfast. 02/03/24   Copland, Jessica C, MD  methocarbamol  (ROBAXIN ) 500 MG tablet Take 500 mg by mouth 3 (three) times daily as needed for muscle spasms.    [provider]  predniSONE  (DELTASONE ) 10 MG tablet 4 tablets x 2 days, 3 tabs x 2 days, 2 tabs x 2 days, 1 tab x 2 days  09/19/23   Paz, Jose E, MD    Allergies: Patient has no known allergies.    Review of Systems  Constitutional:  Negative for chills and fever.  HENT:  Negative for ear pain and sore throat.   Eyes:  Negative for pain and visual disturbance.  Respiratory:  Negative for cough and shortness of breath.   Cardiovascular:  Negative for chest pain and palpitations.  Gastrointestinal:  Negative for abdominal pain and vomiting.  Genitourinary:  Negative for dysuria and hematuria.  Musculoskeletal:  Positive for neck pain. Negative for arthralgias and back pain.  Skin:  Positive for wound. Negative for color change and rash.  Neurological:  Positive for headaches. Negative for seizures and syncope.  All other systems reviewed and are negative.   Updated Vital Signs BP (!) 150/100 (BP Location: Right Arm)   Pulse 90   Temp 98.6 F (37 C) (Temporal)   Resp 18   Ht 1.6 m (5' 3)   Wt 63.5 kg   LMP 03/10/2013 (LMP Unknown)   SpO2 100%   BMI 24.80 kg/m   Physical Exam Vitals and nursing note reviewed.  Constitutional:      General: She is not in acute distress.    Appearance: Normal appearance. She is well-developed. She is not ill-appearing.  HENT:     Head: Normocephalic.     Comments: Abrasion  to the tip of the nose.  No obvious deformity but some swelling.  No septal hematoma in the nose.  Superficial laceration right side of upper lip with swelling.  And the same on the right side of the lower lip.  Upper incisors a little tender to palpation but no obvious avulsion or fracture.  Not loose.    Mouth/Throat:     Comments: See above Eyes:     Extraocular Movements: Extraocular movements intact.     Conjunctiva/sclera: Conjunctivae normal.     Pupils: Pupils are equal, round, and reactive to light.  Cardiovascular:     Rate and Rhythm: Normal rate and regular rhythm.     Heart sounds: No murmur heard. Pulmonary:     Effort: Pulmonary effort is normal. No respiratory distress.      Breath sounds: Normal breath sounds. No wheezing, rhonchi or rales.  Abdominal:     General: There is no distension.     Palpations: Abdomen is soft.     Tenderness: There is no abdominal tenderness. There is no guarding.  Musculoskeletal:        General: Tenderness present. No swelling.     Cervical back: Neck supple.     Comments: Some mild tenderness to the right wrist.  Radial pulses 2+.  Cap refill intact.  Sensation intact.  Good range of motion at wrist fingers and elbow and shoulder.  The right knee also with some mild tenderness to palpation but no dislocation of the kneecap.  Also no significant swelling.  Good range of motion of the knee.  And nontender at the ankle and hip.  Distally neurovascularly intact.  Skin:    General: Skin is warm and dry.     Capillary Refill: Capillary refill takes less than 2 seconds.  Neurological:     General: No focal deficit present.     Mental Status: She is alert and oriented to person, place, and time.     Cranial Nerves: No cranial nerve deficit.     Sensory: No sensory deficit.     Motor: No weakness.  Psychiatric:        Mood and Affect: Mood normal.     (all labs ordered are listed, but only abnormal results are displayed) Labs Reviewed - No data to display  EKG: None  Radiology: CT Head Wo Contrast Result Date: 04/26/2024 EXAM: CT HEAD, FACIAL BONES AND CERVICAL SPINE WITHOUT CONTRAST 04/26/2024 09:19:04 PM TECHNIQUE: CT of the head, facial bones and cervical spine was performed without the administration of intravenous contrast. Multiplanar reformatted images are provided for review. Automated exposure control, iterative reconstruction, and/or weight based adjustment of the mA/kV was utilized to reduce the radiation dose to as low as reasonably achievable. COMPARISON: None available. CLINICAL HISTORY: Head trauma, moderate-severe; Polytrauma, blunt. Table formatting from the original note was not included. Pt reports tripping and  falling, striking face. Pt husband reports possible LOC. Pt denies any blood thinners. FINDINGS: CT HEAD BRAIN AND VENTRICLES: No acute intracranial hemorrhage. No mass effect or midline shift. No extra-axial fluid collection. No evidence of acute infarct. No hydrocephalus. SKULL AND SCALP: No acute skull fracture. No scalp hematoma. CT FACIAL BONES FACIAL BONES: No acute facial fracture. No mandibular dislocation. No suspicious bone lesion. ORBITS: No acute traumatic injury. SINUSES AND MASTOIDS: No acute abnormality. SOFT TISSUES: No acute abnormality. CT CERVICAL SPINE BONES AND ALIGNMENT: No acute fracture or traumatic malalignment. DEGENERATIVE CHANGES: No significant degenerative changes. SOFT TISSUES: No prevertebral soft tissue  swelling. IMPRESSION: 1. No acute intracranial abnormality. 2. No acute fracture or traumatic malalignment of the cervical spine. 3. No acute fracture of the facial bones. Electronically signed by: Franky Stanford MD 04/26/2024 09:24 PM EDT RP Workstation: [id number]   CT Cervical Spine Wo Contrast Result Date: 04/26/2024 EXAM: CT HEAD, FACIAL BONES AND CERVICAL SPINE WITHOUT CONTRAST 04/26/2024 09:19:04 PM TECHNIQUE: CT of the head, facial bones and cervical spine was performed without the administration of intravenous contrast. Multiplanar reformatted images are provided for review. Automated exposure control, iterative reconstruction, and/or weight based adjustment of the mA/kV was utilized to reduce the radiation dose to as low as reasonably achievable. COMPARISON: None available. CLINICAL HISTORY: Head trauma, moderate-severe; Polytrauma, blunt. Table formatting from the original note was not included. Pt reports tripping and falling, striking face. Pt husband reports possible LOC. Pt denies any blood thinners. FINDINGS: CT HEAD BRAIN AND VENTRICLES: No acute intracranial hemorrhage. No mass effect or midline shift. No extra-axial fluid collection. No evidence of acute infarct.  No hydrocephalus. SKULL AND SCALP: No acute skull fracture. No scalp hematoma. CT FACIAL BONES FACIAL BONES: No acute facial fracture. No mandibular dislocation. No suspicious bone lesion. ORBITS: No acute traumatic injury. SINUSES AND MASTOIDS: No acute abnormality. SOFT TISSUES: No acute abnormality. CT CERVICAL SPINE BONES AND ALIGNMENT: No acute fracture or traumatic malalignment. DEGENERATIVE CHANGES: No significant degenerative changes. SOFT TISSUES: No prevertebral soft tissue swelling. IMPRESSION: 1. No acute intracranial abnormality. 2. No acute fracture or traumatic malalignment of the cervical spine. 3. No acute fracture of the facial bones. Electronically signed by: Franky Stanford MD 04/26/2024 09:24 PM EDT RP Workstation: [id number]   CT Maxillofacial WO CM Result Date: 04/26/2024 EXAM: CT HEAD, FACIAL BONES AND CERVICAL SPINE WITHOUT CONTRAST 04/26/2024 09:19:04 PM TECHNIQUE: CT of the head, facial bones and cervical spine was performed without the administration of intravenous contrast. Multiplanar reformatted images are provided for review. Automated exposure control, iterative reconstruction, and/or weight based adjustment of the mA/kV was utilized to reduce the radiation dose to as low as reasonably achievable. COMPARISON: None available. CLINICAL HISTORY: Head trauma, moderate-severe; Polytrauma, blunt. Table formatting from the original note was not included. Pt reports tripping and falling, striking face. Pt husband reports possible LOC. Pt denies any blood thinners. FINDINGS: CT HEAD BRAIN AND VENTRICLES: No acute intracranial hemorrhage. No mass effect or midline shift. No extra-axial fluid collection. No evidence of acute infarct. No hydrocephalus. SKULL AND SCALP: No acute skull fracture. No scalp hematoma. CT FACIAL BONES FACIAL BONES: No acute facial fracture. No mandibular dislocation. No suspicious bone lesion. ORBITS: No acute traumatic injury. SINUSES AND MASTOIDS: No acute  abnormality. SOFT TISSUES: No acute abnormality. CT CERVICAL SPINE BONES AND ALIGNMENT: No acute fracture or traumatic malalignment. DEGENERATIVE CHANGES: No significant degenerative changes. SOFT TISSUES: No prevertebral soft tissue swelling. IMPRESSION: 1. No acute intracranial abnormality. 2. No acute fracture or traumatic malalignment of the cervical spine. 3. No acute fracture of the facial bones. Electronically signed by: Franky Stanford MD 04/26/2024 09:24 PM EDT RP Workstation: [id number]     Procedures   Medications Ordered in the ED - No data to display                                  Medical Decision Making Amount and/or Complexity of Data Reviewed Radiology: ordered.   Patient status post fall.  Will need CT head face and neck.  Patient with  some superficial lacerations to the right upper lip and right lower lip.  Concerns for little bit of injury to her upper incisors.  Which a little tender to palpation will have her follow-up with on-call dentistry who is Dr. Lynwood Blunt.  Abrasion to the nose no obvious deformity.  No concern for any significant injury to the right knee or right wrist or any other extremity injuries.  Patient states tetanus should be up-to-date.  CT head no intracranial abnormality no acute fracture or traumatic malalignment of the cervical spine no acute fracture of the facial bones.  Very reassuring.  Patient should be stable for discharge home.  Patient will need to follow-up with a dentist.  The lip lacerations will heal on their own.  I would recommend antibiotic ointment to the abrasion on the nose.  Follow-up with her doctor.   Final diagnoses:  Fall, initial encounter  Contusion of face, initial encounter  Lip laceration, initial encounter    ED Discharge Orders     None          Geraldene Hamilton, MD 04/26/24 [id number]

## 2024-04-26 NOTE — Discharge Instructions (Signed)
 An appointment follow-up with a dentist.  Either locally or Dr. Jolinda that is in [place] he is on-call for the [facility] system tonight.  He will see you in the office as an outpatient.  CT head neck and face without any significant abnormalities.  No fractures.  No brain injury.  Would recommend washing the wounds with soap and water daily and applying antibiotic ointment at least twice a day.  Expect to be sore and stiff.

## 2024-04-26 NOTE — ED Triage Notes (Signed)
 Pt reports tripping and falling, striking face. Pt husband reports possible LOC. Pt denies any blood thinners.

## 2024-05-01 DIAGNOSIS — G4733 Obstructive sleep apnea (adult) (pediatric): Secondary | ICD-10-CM | POA: Diagnosis not present

## 2024-06-01 DIAGNOSIS — G4733 Obstructive sleep apnea (adult) (pediatric): Secondary | ICD-10-CM | POA: Diagnosis not present

## 2024-06-05 ENCOUNTER — Ambulatory Visit: Admitting: Dermatology

## 2024-07-01 DIAGNOSIS — G4733 Obstructive sleep apnea (adult) (pediatric): Secondary | ICD-10-CM | POA: Diagnosis not present

## 2024-09-13 ENCOUNTER — Encounter: Payer: Medicaid Other | Admitting: Family Medicine
